# Patient Record
Sex: Female | Born: 1935 | Race: White | Hispanic: No | Smoking: Never smoker
Health system: Southern US, Community
[De-identification: ages and names within clinical notes are randomized; demographics above are authoritative.]

## PROBLEM LIST (undated history)

## (undated) DIAGNOSIS — Z5189 Encounter for other specified aftercare: Secondary | ICD-10-CM

## (undated) DIAGNOSIS — R112 Nausea with vomiting, unspecified: Secondary | ICD-10-CM

## (undated) DIAGNOSIS — IMO0001 Reserved for inherently not codable concepts without codable children: Secondary | ICD-10-CM

## (undated) DIAGNOSIS — D649 Anemia, unspecified: Secondary | ICD-10-CM

## (undated) DIAGNOSIS — I509 Heart failure, unspecified: Secondary | ICD-10-CM

## (undated) DIAGNOSIS — N189 Chronic kidney disease, unspecified: Secondary | ICD-10-CM

## (undated) DIAGNOSIS — M1990 Unspecified osteoarthritis, unspecified site: Secondary | ICD-10-CM

## (undated) DIAGNOSIS — R0602 Shortness of breath: Secondary | ICD-10-CM

## (undated) DIAGNOSIS — I10 Essential (primary) hypertension: Secondary | ICD-10-CM

## (undated) DIAGNOSIS — Z9889 Other specified postprocedural states: Secondary | ICD-10-CM

## (undated) DIAGNOSIS — G4730 Sleep apnea, unspecified: Secondary | ICD-10-CM

## (undated) DIAGNOSIS — E119 Type 2 diabetes mellitus without complications: Secondary | ICD-10-CM

## (undated) DIAGNOSIS — M4300 Spondylolysis, site unspecified: Secondary | ICD-10-CM

## (undated) HISTORY — PX: LAMINECTOMY: SHX219

## (undated) HISTORY — DX: Spondylolysis, site unspecified: M43.00

## (undated) HISTORY — DX: Heart failure, unspecified: I50.9

## (undated) HISTORY — DX: Type 2 diabetes mellitus without complications: E11.9

## (undated) HISTORY — PX: OTHER SURGICAL HISTORY: SHX169

## (undated) HISTORY — PX: ABDOMINAL HYSTERECTOMY: SHX81

## (undated) HISTORY — PX: CERVICAL DISCECTOMY: SHX98

## (undated) HISTORY — PX: BACK SURGERY: SHX140

## (undated) HISTORY — DX: Sleep apnea, unspecified: G47.30

---
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
## 1996-06-07 DIAGNOSIS — M4300 Spondylolysis, site unspecified: Secondary | ICD-10-CM

## 1996-06-07 HISTORY — DX: Spondylolysis, site unspecified: M43.00

## 2000-08-31 ENCOUNTER — Other Ambulatory Visit: Admission: RE | Admit: 2000-08-31 | Discharge: 2000-08-31 | Payer: Self-pay | Admitting: Gastroenterology

## 2000-08-31 ENCOUNTER — Encounter (INDEPENDENT_AMBULATORY_CARE_PROVIDER_SITE_OTHER): Payer: Self-pay

## 2000-09-26 ENCOUNTER — Ambulatory Visit (HOSPITAL_COMMUNITY): Admission: RE | Admit: 2000-09-26 | Discharge: 2000-09-26 | Payer: Self-pay | Admitting: Gastroenterology

## 2000-09-26 ENCOUNTER — Encounter: Payer: Self-pay | Admitting: Gastroenterology

## 2003-10-21 ENCOUNTER — Ambulatory Visit (HOSPITAL_COMMUNITY): Admission: RE | Admit: 2003-10-21 | Discharge: 2003-10-21 | Payer: Self-pay | Admitting: Gastroenterology

## 2004-11-12 ENCOUNTER — Ambulatory Visit: Payer: Self-pay | Admitting: Family Medicine

## 2004-12-03 ENCOUNTER — Ambulatory Visit: Payer: Self-pay | Admitting: Gastroenterology

## 2005-04-23 ENCOUNTER — Encounter: Payer: Self-pay | Admitting: Orthopedic Surgery

## 2005-05-07 ENCOUNTER — Encounter: Payer: Self-pay | Admitting: Orthopedic Surgery

## 2005-06-07 ENCOUNTER — Encounter: Payer: Self-pay | Admitting: Orthopedic Surgery

## 2005-07-08 ENCOUNTER — Encounter: Payer: Self-pay | Admitting: Orthopedic Surgery

## 2005-08-05 ENCOUNTER — Encounter: Payer: Self-pay | Admitting: Orthopedic Surgery

## 2005-09-05 ENCOUNTER — Encounter: Payer: Self-pay | Admitting: Orthopedic Surgery

## 2005-12-27 ENCOUNTER — Ambulatory Visit: Payer: Self-pay | Admitting: Gastroenterology

## 2005-12-28 ENCOUNTER — Ambulatory Visit: Payer: Self-pay | Admitting: Gastroenterology

## 2005-12-28 ENCOUNTER — Encounter: Payer: Self-pay | Admitting: Gastroenterology

## 2006-02-08 ENCOUNTER — Ambulatory Visit: Payer: Self-pay | Admitting: Family Medicine

## 2006-03-02 ENCOUNTER — Ambulatory Visit: Payer: Self-pay | Admitting: Family Medicine

## 2006-03-07 ENCOUNTER — Ambulatory Visit: Payer: Self-pay | Admitting: Family Medicine

## 2006-04-07 ENCOUNTER — Ambulatory Visit: Payer: Self-pay | Admitting: Family Medicine

## 2006-05-11 ENCOUNTER — Ambulatory Visit: Payer: Self-pay | Admitting: Family Medicine

## 2006-05-19 ENCOUNTER — Encounter: Payer: Self-pay | Admitting: Family Medicine

## 2006-06-07 ENCOUNTER — Encounter: Payer: Self-pay | Admitting: Family Medicine

## 2006-06-07 ENCOUNTER — Ambulatory Visit: Payer: Self-pay | Admitting: Family Medicine

## 2006-07-08 ENCOUNTER — Encounter: Payer: Self-pay | Admitting: Family Medicine

## 2007-02-13 ENCOUNTER — Ambulatory Visit: Payer: Self-pay | Admitting: Family Medicine

## 2007-04-05 ENCOUNTER — Emergency Department: Payer: Self-pay

## 2007-10-02 ENCOUNTER — Emergency Department: Payer: Self-pay | Admitting: Emergency Medicine

## 2007-10-02 ENCOUNTER — Other Ambulatory Visit: Payer: Self-pay

## 2007-11-01 ENCOUNTER — Ambulatory Visit: Payer: Self-pay | Admitting: Gastroenterology

## 2007-12-04 ENCOUNTER — Ambulatory Visit: Payer: Self-pay | Admitting: Oncology

## 2007-12-06 ENCOUNTER — Ambulatory Visit: Payer: Self-pay | Admitting: Oncology

## 2007-12-12 ENCOUNTER — Ambulatory Visit: Payer: Self-pay | Admitting: Oncology

## 2008-01-02 ENCOUNTER — Encounter: Payer: Self-pay | Admitting: Internal Medicine

## 2008-01-06 ENCOUNTER — Ambulatory Visit: Payer: Self-pay | Admitting: Oncology

## 2008-02-20 ENCOUNTER — Ambulatory Visit: Payer: Self-pay | Admitting: Family Medicine

## 2008-02-23 ENCOUNTER — Ambulatory Visit: Payer: Self-pay | Admitting: Family Medicine

## 2008-02-26 ENCOUNTER — Ambulatory Visit: Payer: Self-pay | Admitting: Internal Medicine

## 2008-02-26 DIAGNOSIS — I10 Essential (primary) hypertension: Secondary | ICD-10-CM | POA: Insufficient documentation

## 2008-02-26 DIAGNOSIS — R5383 Other fatigue: Secondary | ICD-10-CM | POA: Insufficient documentation

## 2008-02-26 DIAGNOSIS — R069 Unspecified abnormalities of breathing: Secondary | ICD-10-CM | POA: Insufficient documentation

## 2008-02-26 DIAGNOSIS — G629 Polyneuropathy, unspecified: Secondary | ICD-10-CM | POA: Insufficient documentation

## 2008-02-26 DIAGNOSIS — R0609 Other forms of dyspnea: Secondary | ICD-10-CM

## 2008-02-26 DIAGNOSIS — R0989 Other specified symptoms and signs involving the circulatory and respiratory systems: Secondary | ICD-10-CM

## 2008-09-12 ENCOUNTER — Encounter: Payer: Self-pay | Admitting: Family Medicine

## 2008-10-05 ENCOUNTER — Encounter: Payer: Self-pay | Admitting: Family Medicine

## 2008-11-05 ENCOUNTER — Encounter: Payer: Self-pay | Admitting: Family Medicine

## 2008-12-05 ENCOUNTER — Encounter: Payer: Self-pay | Admitting: Family Medicine

## 2009-05-07 ENCOUNTER — Ambulatory Visit: Payer: Self-pay | Admitting: Oncology

## 2009-05-26 ENCOUNTER — Ambulatory Visit: Payer: Self-pay | Admitting: Oncology

## 2009-06-07 ENCOUNTER — Ambulatory Visit: Payer: Self-pay | Admitting: Oncology

## 2009-07-08 ENCOUNTER — Ambulatory Visit: Payer: Self-pay | Admitting: Family Medicine

## 2009-07-08 ENCOUNTER — Ambulatory Visit: Payer: Self-pay | Admitting: Oncology

## 2009-07-24 ENCOUNTER — Ambulatory Visit: Payer: Self-pay | Admitting: Oncology

## 2009-08-05 ENCOUNTER — Ambulatory Visit: Payer: Self-pay | Admitting: Oncology

## 2009-08-27 ENCOUNTER — Ambulatory Visit: Payer: Self-pay | Admitting: Gastroenterology

## 2009-10-05 ENCOUNTER — Ambulatory Visit: Payer: Self-pay | Admitting: Oncology

## 2009-10-23 ENCOUNTER — Ambulatory Visit: Payer: Self-pay | Admitting: Oncology

## 2009-11-05 ENCOUNTER — Ambulatory Visit: Payer: Self-pay | Admitting: Oncology

## 2009-11-05 ENCOUNTER — Ambulatory Visit: Payer: Self-pay | Admitting: Family Medicine

## 2009-11-06 ENCOUNTER — Ambulatory Visit: Payer: Self-pay | Admitting: Oncology

## 2009-12-22 ENCOUNTER — Ambulatory Visit: Payer: Self-pay | Admitting: Family Medicine

## 2009-12-24 ENCOUNTER — Ambulatory Visit: Payer: Self-pay | Admitting: Family Medicine

## 2010-01-05 ENCOUNTER — Ambulatory Visit: Payer: Self-pay | Admitting: Oncology

## 2010-01-07 ENCOUNTER — Ambulatory Visit: Payer: Self-pay | Admitting: Oncology

## 2010-02-05 ENCOUNTER — Ambulatory Visit: Payer: Self-pay | Admitting: Oncology

## 2010-02-12 ENCOUNTER — Ambulatory Visit: Payer: Self-pay | Admitting: Oncology

## 2010-03-02 ENCOUNTER — Ambulatory Visit: Payer: Self-pay | Admitting: Family Medicine

## 2010-03-07 ENCOUNTER — Ambulatory Visit: Payer: Self-pay | Admitting: Oncology

## 2010-03-30 ENCOUNTER — Inpatient Hospital Stay (HOSPITAL_COMMUNITY): Admission: RE | Admit: 2010-03-30 | Discharge: 2010-04-01 | Payer: Self-pay | Admitting: Neurosurgery

## 2010-08-19 LAB — CBC
MCH: 37.2 pg — ABNORMAL HIGH (ref 26.0–34.0)
MCV: 106.9 fL — ABNORMAL HIGH (ref 78.0–100.0)
Platelets: 273 10*3/uL (ref 150–400)
RBC: 2.9 MIL/uL — ABNORMAL LOW (ref 3.87–5.11)

## 2010-08-19 LAB — BASIC METABOLIC PANEL
BUN: 16 mg/dL (ref 6–23)
CO2: 31 mEq/L (ref 19–32)
Calcium: 9.6 mg/dL (ref 8.4–10.5)
Chloride: 103 mEq/L (ref 96–112)
Creatinine, Ser: 1.06 mg/dL (ref 0.4–1.2)
GFR calc Af Amer: >60 mL/min (ref >60)
GFR calc non Af Amer: 50 mL/min — ABNORMAL LOW (ref >60)
Glucose, Bld: 92 mg/dL (ref 70–99)
Potassium: 4.5 mEq/L (ref 3.5–5.1)
Sodium: 140 mEq/L (ref 135–145)

## 2010-09-23 ENCOUNTER — Ambulatory Visit: Payer: Self-pay | Admitting: Family Medicine

## 2010-10-12 ENCOUNTER — Encounter: Payer: Self-pay | Admitting: Family Medicine

## 2010-10-23 NOTE — Assessment & Plan Note (Signed)
[facility]                           GASTROENTEROLOGY OFFICE NOTE   NAME:Tarleton, Amyri                     MRN:          [id number]  DATE:12/27/2005                            DOB:          [birth year]    PROBLEM:  Dysphagia.   HISTORY OF PRESENT ILLNESS:  Mrs. Kitchings has returned still complaining  of dysphagia.  She has dysphagia to solids and has what sounds like  occasional food impaction requiring liquids to pass her solid foods.  She  also complains of intermittent loose stools with slight soilage of her  undergarments.  She is without abdominal pain.  Reflux symptoms are well-  controlled.   MEDICATIONS:  Medication include Neurontin, Premarin, atenolol, and AcipHex.   PHYSICAL EXAMINATION:  VITAL SIGNS:  Pulse 78, blood pressure 132/72, weight  177.   IMPRESSION:  Recurrent esophageal stricture.   RECOMMENDATION:  1.  Upper endoscopy with savary dilatation.  2.  Fiber supplementation for loose stools.                                   Barbette Hair. Arlyce Dice, MD, [facility]   RDK/MedQ  DD:  12/27/2005  DT:  12/27/2005  Job #:  [id number]   cc:   Skip Estimable

## 2010-11-06 ENCOUNTER — Encounter: Payer: Self-pay | Admitting: Family Medicine

## 2010-12-06 ENCOUNTER — Encounter: Payer: Self-pay | Admitting: Family Medicine

## 2010-12-29 LAB — HM COLONOSCOPY: HM Colonoscopy: NORMAL

## 2010-12-29 LAB — HM DEXA SCAN

## 2011-01-06 ENCOUNTER — Encounter: Payer: Self-pay | Admitting: Family Medicine

## 2011-01-13 ENCOUNTER — Ambulatory Visit: Payer: Self-pay | Admitting: Neurosurgery

## 2011-01-15 ENCOUNTER — Ambulatory Visit: Payer: Self-pay | Admitting: Ophthalmology

## 2011-01-26 ENCOUNTER — Ambulatory Visit: Payer: Self-pay | Admitting: Ophthalmology

## 2011-02-06 ENCOUNTER — Encounter: Payer: Self-pay | Admitting: Family Medicine

## 2011-04-07 ENCOUNTER — Encounter (HOSPITAL_COMMUNITY): Payer: Self-pay

## 2011-04-07 ENCOUNTER — Encounter (HOSPITAL_COMMUNITY)
Admission: RE | Admit: 2011-04-07 | Discharge: 2011-04-07 | Disposition: A | Discharge status: Home / Self Care | Payer: Medicare Other | Source: Ambulatory Visit | Attending: Neurosurgery | Admitting: Neurosurgery

## 2011-04-07 HISTORY — DX: Encounter for other specified aftercare: Z51.89

## 2011-04-07 HISTORY — DX: Reserved for inherently not codable concepts without codable children: IMO0001

## 2011-04-07 HISTORY — DX: Unspecified osteoarthritis, unspecified site: M19.90

## 2011-04-07 HISTORY — DX: Shortness of breath: R06.02

## 2011-04-07 HISTORY — DX: Anemia, unspecified: D64.9

## 2011-04-07 HISTORY — DX: Chronic kidney disease, unspecified: N18.9

## 2011-04-07 HISTORY — DX: Other specified postprocedural states: R11.2

## 2011-04-07 HISTORY — DX: Essential (primary) hypertension: I10

## 2011-04-07 HISTORY — DX: Other specified postprocedural states: Z98.890

## 2011-04-07 LAB — BASIC METABOLIC PANEL
BUN: 16 mg/dL (ref 6–23)
CO2: 31 mEq/L (ref 19–32)
Calcium: 9.3 mg/dL (ref 8.4–10.5)
Chloride: 103 mEq/L (ref 96–112)
Creatinine, Ser: 0.83 mg/dL (ref 0.50–1.10)
GFR calc Af Amer: 74 mL/min — ABNORMAL LOW (ref >90)

## 2011-04-07 LAB — CBC
HCT: 30.7 % — ABNORMAL LOW (ref 36.0–46.0)
MCH: 37.2 pg — ABNORMAL HIGH (ref 26.0–34.0)
MCV: 108.9 fL — ABNORMAL HIGH (ref 78.0–100.0)
Platelets: 278 10*3/uL (ref 150–400)
RDW: 17.6 % — ABNORMAL HIGH (ref 11.5–15.5)
WBC: 9.5 10*3/uL (ref 4.0–10.5)

## 2011-04-07 NOTE — Pre-Procedure Instructions (Signed)
20 Renee Carpenter  04/07/2011   Your procedure is scheduled on: Wednesday, November 7TH  Report to [facility] at 6:30 AM.  Call this number if you have problems the morning of surgery: [phone]   Remember:   Do not eat food: 12 MIDNIGHT TUESDAY  Do not drink clear liquids: 4 Hours before arrival.  Take these medicines the morning of surgery with A SIP OF WATER: TRAMADOL   Do not wear jewelry, make-up or nail polish.   Do not wear lotions, powders, or perfumes. You may wear deodorant.   Do not shave 48 hours prior to surgery.   Do not bring valuables to the hospital.   Contacts, dentures or bridgework may not be worn into surgery.   Leave suitcase in the car. After surgery it may be brought to your room .  For patients admitted to the hospital, checkout time is 11:00 AM the day of discharge.   Patients discharged the day of surgery will not be allowed to drive home.  Name and phone number of your driver: HARRELL BARRINGTON --FRIEND  Special Instructions: CHG Shower Use Special Wash: 1/2 bottle night before surgery and 1/2 bottle morning of surgery.   Please read over the following fact sheets that you were given: Coughing and Deep Breathing and MRSA Information

## 2011-04-13 MED ORDER — CEFAZOLIN SODIUM 1-5 GM-% IV SOLN
1.0000 g | INTRAVENOUS | Status: DC
  Filled 2011-04-13: qty 50

## 2011-04-14 ENCOUNTER — Ambulatory Visit (HOSPITAL_COMMUNITY): Payer: Medicare Other

## 2011-04-14 ENCOUNTER — Ambulatory Visit (HOSPITAL_COMMUNITY): Payer: Medicare Other | Admitting: Anesthesiology

## 2011-04-14 ENCOUNTER — Inpatient Hospital Stay (HOSPITAL_COMMUNITY)
Admission: RE | Admit: 2011-04-14 | Discharge: 2011-04-19 | DRG: 491 | Disposition: A | Discharge status: Skilled Nursing Facility | Payer: Medicare Other | Source: Ambulatory Visit | Attending: Neurosurgery | Admitting: Neurosurgery

## 2011-04-14 ENCOUNTER — Encounter (HOSPITAL_COMMUNITY): Payer: Self-pay | Admitting: Anesthesiology

## 2011-04-14 ENCOUNTER — Encounter (HOSPITAL_COMMUNITY): Payer: Self-pay | Admitting: *Deleted

## 2011-04-14 ENCOUNTER — Encounter (HOSPITAL_COMMUNITY)
Admission: RE | Disposition: A | Discharge status: Skilled Nursing Facility | Payer: Self-pay | Source: Ambulatory Visit | Attending: Neurosurgery

## 2011-04-14 DIAGNOSIS — M47817 Spondylosis without myelopathy or radiculopathy, lumbosacral region: Principal | ICD-10-CM | POA: Diagnosis present

## 2011-04-14 DIAGNOSIS — Z981 Arthrodesis status: Secondary | ICD-10-CM

## 2011-04-14 DIAGNOSIS — I10 Essential (primary) hypertension: Secondary | ICD-10-CM | POA: Diagnosis present

## 2011-04-14 DIAGNOSIS — G589 Mononeuropathy, unspecified: Secondary | ICD-10-CM | POA: Diagnosis present

## 2011-04-14 DIAGNOSIS — M48061 Spinal stenosis, lumbar region without neurogenic claudication: Secondary | ICD-10-CM | POA: Diagnosis present

## 2011-04-14 DIAGNOSIS — Z01812 Encounter for preprocedural laboratory examination: Secondary | ICD-10-CM

## 2011-04-14 HISTORY — PX: LUMBAR LAMINECTOMY/DECOMPRESSION MICRODISCECTOMY: SHX5026

## 2011-04-14 SURGERY — LUMBAR LAMINECTOMY/DECOMPRESSION MICRODISCECTOMY
Anesthesia: General | Site: Spine Lumbar | Wound class: Clean

## 2011-04-14 MED ORDER — SODIUM CHLORIDE 0.9 % IJ SOLN: 3.0000 mL | Freq: Two times a day (BID) | INTRAMUSCULAR | Status: DC

## 2011-04-14 MED ORDER — PROMETHAZINE HCL 25 MG PO TABS: 12.5000 mg | ORAL_TABLET | Freq: Four times a day (QID) | ORAL | Status: DC | PRN

## 2011-04-14 MED ORDER — PANTOPRAZOLE SODIUM 40 MG PO TBEC
40.0000 mg | DELAYED_RELEASE_TABLET | Freq: Every day | ORAL | Status: DC
  Administered 2011-04-14 – 2011-04-19 (×6): 40 mg via ORAL
  Filled 2011-04-14 (×6): qty 1

## 2011-04-14 MED ORDER — GABAPENTIN 300 MG PO CAPS
300.0000 mg | ORAL_CAPSULE | Freq: Every day | ORAL | Status: DC
  Administered 2011-04-15 – 2011-04-17 (×3): 300 mg via ORAL
  Filled 2011-04-14 (×4): qty 1

## 2011-04-14 MED ORDER — ONDANSETRON HCL 4 MG/2ML IJ SOLN
4.0000 mg | INTRAMUSCULAR | Status: DC | PRN
  Administered 2011-04-14: 4 mg via INTRAVENOUS
  Filled 2011-04-14: qty 2

## 2011-04-14 MED ORDER — SODIUM CHLORIDE 0.9 % IV SOLN
250.0000 mL | INTRAVENOUS | Status: DC
  Administered 2011-04-15: 250 mL via INTRAVENOUS

## 2011-04-14 MED ORDER — ARTIFICIAL TEARS OP OINT
TOPICAL_OINTMENT | OPHTHALMIC | Status: DC | PRN
  Administered 2011-04-14: 1 via OPHTHALMIC

## 2011-04-14 MED ORDER — GABAPENTIN 600 MG PO TABS
600.0000 mg | ORAL_TABLET | Freq: Every day | ORAL | Status: DC
  Administered 2011-04-14 – 2011-04-16 (×3): 600 mg via ORAL
  Filled 2011-04-14 (×4): qty 1

## 2011-04-14 MED ORDER — TRAMADOL HCL 50 MG PO TABS
50.0000 mg | ORAL_TABLET | Freq: Four times a day (QID) | ORAL | Status: DC | PRN
  Administered 2011-04-15 – 2011-04-19 (×9): 50 mg via ORAL
  Filled 2011-04-14 (×7): qty 1

## 2011-04-14 MED ORDER — GABAPENTIN 300 MG PO CAPS: 300.0000 mg | ORAL_CAPSULE | Freq: Four times a day (QID) | ORAL | Status: DC

## 2011-04-14 MED ORDER — DEXAMETHASONE SODIUM PHOSPHATE 4 MG/ML IJ SOLN
8.0000 mg | Freq: Once | INTRAMUSCULAR | Status: AC
  Administered 2011-04-14: 8 mg via INTRAVENOUS

## 2011-04-14 MED ORDER — NEOSTIGMINE METHYLSULFATE 1 MG/ML IJ SOLN
INTRAMUSCULAR | Status: DC | PRN
  Administered 2011-04-14: 2 mg via INTRAVENOUS

## 2011-04-14 MED ORDER — KCL-LACTATED RINGERS 20 MEQ/L IV SOLN
INTRAVENOUS | Status: DC
  Filled 2011-04-14: qty 1000

## 2011-04-14 MED ORDER — HEMOSTATIC AGENTS (NO CHARGE) OPTIME
TOPICAL | Status: DC | PRN
  Administered 2011-04-14: 1 via TOPICAL

## 2011-04-14 MED ORDER — MORPHINE SULFATE 2 MG/ML IJ SOLN
1.0000 mg | INTRAMUSCULAR | Status: DC | PRN
  Administered 2011-04-14: 1 mg via INTRAVENOUS
  Administered 2011-04-14: 2 mg via INTRAVENOUS
  Administered 2011-04-14: 1 mg via INTRAVENOUS
  Administered 2011-04-15 – 2011-04-19 (×12): 2 mg via INTRAVENOUS
  Filled 2011-04-14 (×16): qty 1

## 2011-04-14 MED ORDER — ACYCLOVIR 400 MG PO TABS
400.0000 mg | ORAL_TABLET | Freq: Every day | ORAL | Status: DC
  Administered 2011-04-14: 400 mg via ORAL
  Filled 2011-04-14 (×2): qty 1

## 2011-04-14 MED ORDER — HYDROMORPHONE HCL PF 1 MG/ML IJ SOLN
0.2500 mg | INTRAMUSCULAR | Status: DC | PRN
  Administered 2011-04-14 (×2): 0.25 mg via INTRAVENOUS

## 2011-04-14 MED ORDER — FENTANYL CITRATE 0.05 MG/ML IJ SOLN
INTRAMUSCULAR | Status: DC | PRN
  Administered 2011-04-14 (×2): 100 ug via INTRAVENOUS
  Administered 2011-04-14: 50 ug via INTRAVENOUS

## 2011-04-14 MED ORDER — ONDANSETRON HCL 4 MG/2ML IJ SOLN
INTRAMUSCULAR | Status: DC | PRN
  Administered 2011-04-14 (×2): 4 mg via INTRAVENOUS

## 2011-04-14 MED ORDER — CEFAZOLIN SODIUM 1-5 GM-% IV SOLN
1.0000 g | Freq: Three times a day (TID) | INTRAVENOUS | Status: AC
  Administered 2011-04-14 – 2011-04-15 (×2): 1 g via INTRAVENOUS
  Filled 2011-04-14 (×2): qty 50

## 2011-04-14 MED ORDER — ROCURONIUM BROMIDE 100 MG/10ML IV SOLN
INTRAVENOUS | Status: DC | PRN
  Administered 2011-04-14: 40 mg via INTRAVENOUS

## 2011-04-14 MED ORDER — CEFAZOLIN SODIUM 1-5 GM-% IV SOLN
INTRAVENOUS | Status: DC | PRN
  Administered 2011-04-14: 1 g via INTRAVENOUS

## 2011-04-14 MED ORDER — SODIUM CHLORIDE 0.9 % IJ SOLN: 3.0000 mL | INTRAMUSCULAR | Status: DC | PRN

## 2011-04-14 MED ORDER — MORPHINE SULFATE 2 MG/ML IJ SOLN: 0.0500 mg/kg | INTRAMUSCULAR | Status: DC | PRN

## 2011-04-14 MED ORDER — THERA M PLUS PO TABS
1.0000 | ORAL_TABLET | Freq: Every day | ORAL | Status: DC
  Administered 2011-04-14 – 2011-04-19 (×5): 1 via ORAL
  Filled 2011-04-14 (×6): qty 1

## 2011-04-14 MED ORDER — DEXAMETHASONE SODIUM PHOSPHATE 10 MG/ML IJ SOLN
INTRAMUSCULAR | Status: AC
  Filled 2011-04-14: qty 1

## 2011-04-14 MED ORDER — LOSARTAN POTASSIUM 50 MG PO TABS
50.0000 mg | ORAL_TABLET | Freq: Every day | ORAL | Status: DC
  Administered 2011-04-14 – 2011-04-18 (×5): 50 mg via ORAL
  Filled 2011-04-14 (×6): qty 1

## 2011-04-14 MED ORDER — BACITRACIN ZINC 500 UNIT/GM EX OINT
TOPICAL_OINTMENT | CUTANEOUS | Status: DC | PRN
  Administered 2011-04-14: 1 via TOPICAL

## 2011-04-14 MED ORDER — HYDROMORPHONE HCL PF 1 MG/ML IJ SOLN
0.2500 mg | INTRAMUSCULAR | Status: DC | PRN
  Administered 2011-04-14: 0.5 mg via INTRAVENOUS

## 2011-04-14 MED ORDER — GLYCOPYRROLATE 0.2 MG/ML IJ SOLN
INTRAMUSCULAR | Status: DC | PRN
  Administered 2011-04-14: 0.2 mg via INTRAVENOUS

## 2011-04-14 MED ORDER — DOCUSATE SODIUM 100 MG PO CAPS
100.0000 mg | ORAL_CAPSULE | Freq: Two times a day (BID) | ORAL | Status: DC
  Administered 2011-04-14 – 2011-04-19 (×10): 100 mg via ORAL
  Filled 2011-04-14 (×10): qty 1

## 2011-04-14 MED ORDER — LACTATED RINGERS IV SOLN
INTRAVENOUS | Status: DC | PRN
  Administered 2011-04-14 (×2): via INTRAVENOUS

## 2011-04-14 MED ORDER — MIDAZOLAM HCL 5 MG/5ML IJ SOLN
INTRAMUSCULAR | Status: DC | PRN
  Administered 2011-04-14: 0.5 mg via INTRAVENOUS

## 2011-04-14 MED ORDER — GABAPENTIN 300 MG PO CAPS
300.0000 mg | ORAL_CAPSULE | Freq: Every day | ORAL | Status: DC
  Administered 2011-04-15 – 2011-04-16 (×2): 300 mg via ORAL
  Filled 2011-04-14 (×3): qty 1

## 2011-04-14 MED ORDER — HYPROMELLOSE (GONIOSCOPIC) 2.5 % OP SOLN
1.0000 [drp] | Freq: Three times a day (TID) | OPHTHALMIC | Status: DC | PRN
  Filled 2011-04-14: qty 15

## 2011-04-14 MED ORDER — DIAZEPAM 2 MG PO TABS
2.0000 mg | ORAL_TABLET | Freq: Four times a day (QID) | ORAL | Status: DC | PRN
  Administered 2011-04-17 – 2011-04-19 (×7): 2 mg via ORAL
  Filled 2011-04-14 (×6): qty 1

## 2011-04-14 MED ORDER — BUPIVACAINE-EPINEPHRINE PF 0.5-1:200000 % IJ SOLN
INTRAMUSCULAR | Status: DC | PRN
  Administered 2011-04-14: 10 mL

## 2011-04-14 MED ORDER — HYDROXYZINE HCL 10 MG PO TABS
10.0000 mg | ORAL_TABLET | Freq: Every day | ORAL | Status: DC | PRN
  Filled 2011-04-14: qty 1

## 2011-04-14 MED ORDER — ESTROGENS CONJUGATED 0.625 MG PO TABS
0.6250 mg | ORAL_TABLET | Freq: Every day | ORAL | Status: DC
  Administered 2011-04-14 – 2011-04-19 (×6): 0.625 mg via ORAL
  Filled 2011-04-14 (×6): qty 1

## 2011-04-14 MED ORDER — PROPOFOL 10 MG/ML IV EMUL
INTRAVENOUS | Status: DC | PRN
  Administered 2011-04-14: 120 mg via INTRAVENOUS

## 2011-04-14 MED ORDER — LACTATED RINGERS IV SOLN: INTRAVENOUS | Status: DC

## 2011-04-14 MED ORDER — SODIUM CHLORIDE 0.9 % IR SOLN
Status: DC | PRN
  Administered 2011-04-14 (×3)

## 2011-04-14 MED ORDER — HYDROMORPHONE HCL PF 1 MG/ML IJ SOLN
0.2500 mg | INTRAMUSCULAR | Status: DC | PRN
Start: 2011-04-14 — End: 2011-04-14
  Administered 2011-04-14 (×2): 0.5 mg via INTRAVENOUS

## 2011-04-14 MED ORDER — ZOLPIDEM TARTRATE 5 MG PO TABS: 5.0000 mg | ORAL_TABLET | Freq: Every evening | ORAL | Status: DC | PRN

## 2011-04-14 SURGICAL SUPPLY — 58 items
APL SKNCLS STERI-STRIP NONHPOA (GAUZE/BANDAGES/DRESSINGS) ×1
BAG DECANTER FOR FLEXI CONT (MISCELLANEOUS) ×2 IMPLANT
BENZOIN TINCTURE PRP APPL 2/3 (GAUZE/BANDAGES/DRESSINGS) ×2 IMPLANT
BLADE SURG ROTATE 9660 (MISCELLANEOUS) IMPLANT
BRUSH SCRUB EZ PLAIN DRY (MISCELLANEOUS) ×2 IMPLANT
BUR ACORN 6.0 (BURR) ×2 IMPLANT
BUR MATCHSTICK NEURO 3.0 LAGG (BURR) ×2 IMPLANT
CANISTER SUCTION 2500CC (MISCELLANEOUS) ×2 IMPLANT
CLOTH BEACON ORANGE TIMEOUT ST (SAFETY) ×2 IMPLANT
CONT SPEC 4OZ CLIKSEAL STRL BL (MISCELLANEOUS) ×2 IMPLANT
DRAPE LAPAROTOMY 100X72X124 (DRAPES) ×2 IMPLANT
DRAPE MICROSCOPE LEICA (MISCELLANEOUS) ×2 IMPLANT
DRAPE POUCH INSTRU U-SHP 10X18 (DRAPES) ×2 IMPLANT
DRAPE SURG 17X23 STRL (DRAPES) ×8 IMPLANT
ELECT BLADE 4.0 EZ CLEAN MEGAD (MISCELLANEOUS) ×2
ELECT REM PT RETURN 9FT ADLT (ELECTROSURGICAL) ×2
ELECTRODE BLDE 4.0 EZ CLN MEGD (MISCELLANEOUS) ×1 IMPLANT
ELECTRODE REM PT RTRN 9FT ADLT (ELECTROSURGICAL) ×1 IMPLANT
GAUZE SPONGE 4X4 16PLY XRAY LF (GAUZE/BANDAGES/DRESSINGS) IMPLANT
GLOVE BIO SURGEON STRL SZ8.5 (GLOVE) ×2 IMPLANT
GLOVE BIOGEL PI IND STRL 8 (GLOVE) IMPLANT
GLOVE BIOGEL PI IND STRL 8.5 (GLOVE) IMPLANT
GLOVE BIOGEL PI INDICATOR 8 (GLOVE) ×1
GLOVE BIOGEL PI INDICATOR 8.5 (GLOVE) ×2
GLOVE ECLIPSE 7.5 STRL STRAW (GLOVE) ×1 IMPLANT
GLOVE EXAM NITRILE LRG STRL (GLOVE) IMPLANT
GLOVE EXAM NITRILE MD LF STRL (GLOVE) ×1 IMPLANT
GLOVE EXAM NITRILE XL STR (GLOVE) IMPLANT
GLOVE EXAM NITRILE XS STR PU (GLOVE) IMPLANT
GLOVE SS BIOGEL STRL SZ 8 (GLOVE) ×1 IMPLANT
GLOVE SUPERSENSE BIOGEL SZ 8 (GLOVE) ×3
GLOVE SURG SS PI 8.0 STRL IVOR (GLOVE) ×1 IMPLANT
GOWN BRE IMP SLV AUR LG STRL (GOWN DISPOSABLE) IMPLANT
GOWN BRE IMP SLV AUR XL STRL (GOWN DISPOSABLE) ×3 IMPLANT
GOWN STRL REIN 2XL LVL4 (GOWN DISPOSABLE) ×2 IMPLANT
KIT BASIN OR (CUSTOM PROCEDURE TRAY) ×2 IMPLANT
KIT ROOM TURNOVER OR (KITS) ×2 IMPLANT
NDL HYPO 21X1.5 SAFETY (NEEDLE) IMPLANT
NEEDLE HYPO 21X1.5 SAFETY (NEEDLE) IMPLANT
NEEDLE HYPO 22GX1.5 SAFETY (NEEDLE) ×2 IMPLANT
NS IRRIG 1000ML POUR BTL (IV SOLUTION) ×2 IMPLANT
PACK LAMINECTOMY NEURO (CUSTOM PROCEDURE TRAY) ×2 IMPLANT
PAD ARMBOARD 7.5X6 YLW CONV (MISCELLANEOUS) ×8 IMPLANT
PATTIES SURGICAL .5 X1 (DISPOSABLE) ×1 IMPLANT
RUBBERBAND STERILE (MISCELLANEOUS) ×4 IMPLANT
SPONGE GAUZE 4X4 12PLY (GAUZE/BANDAGES/DRESSINGS) ×2 IMPLANT
SPONGE SURGIFOAM ABS GEL SZ50 (HEMOSTASIS) ×2 IMPLANT
STRIP CLOSURE SKIN 1/2X4 (GAUZE/BANDAGES/DRESSINGS) ×2 IMPLANT
SUT PROLENE 6 0 BV (SUTURE) ×1 IMPLANT
SUT VIC AB 1 CT1 18XBRD ANBCTR (SUTURE) ×2 IMPLANT
SUT VIC AB 1 CT1 8-18 (SUTURE) ×4
SUT VIC AB 2-0 CP2 18 (SUTURE) ×4 IMPLANT
SYR 20CC LL (SYRINGE) IMPLANT
SYR 20ML ECCENTRIC (SYRINGE) ×2 IMPLANT
TAPE CLOTH SURG 4X10 WHT LF (GAUZE/BANDAGES/DRESSINGS) ×1 IMPLANT
TOWEL OR 17X24 6PK STRL BLUE (TOWEL DISPOSABLE) ×2 IMPLANT
TOWEL OR 17X26 10 PK STRL BLUE (TOWEL DISPOSABLE) ×2 IMPLANT
WATER STERILE IRR 1000ML POUR (IV SOLUTION) ×2 IMPLANT

## 2011-04-14 NOTE — Progress Notes (Signed)
Subjective:  The patient is alert appears comfortable and has no complaints. She looks well.  Objective: Vital signs in last 24 hours: Temp:  [97.6 F (36.4 C)-98.1 F (36.7 C)] 97.6 F (36.4 C) (11/07 1600) Pulse Rate:  [67-103] 72  (11/07 1600) Resp:  [14-27] 16  (11/07 1600) BP: (138-196)/(68-91) 156/75 mmHg (11/07 1600) SpO2:  [96 %-100 %] 96 % (11/07 1600)  Intake/Output from previous day:   Intake/Output this shift: Total I/O In: 1250 [I.V.:1250] Out: 75 [Blood:75]  Physical exam the patient is alert and oriented. She is moving her lower extremities well. Normal bulk motor strength in her bilateral quadriceps gastrocnemius dorsiflexors.  Lab Results: No results found for this basename: WBC:2,HGB:2,HCT:2,PLT:2 in the last 72 hours BMET No results found for this basename: NA:2,K:2,CL:2,CO2:2,GLUCOSE:2,BUN:2,CREATININE:2,CALCIUM:2 in the last 72 hours  Studies/Results: Dg Lumbar Spine 1 View Neuro Rm 31-dr. Lovell Sheehan  04/14/2011  *RADIOLOGY REPORT*  Clinical Data: Lumbar laminectomy and decompression at L3-4.  OPERATIVE LUMBAR SPINE - 1 VIEW 04/14/2011:  Comparison: Lumbar spine x-rays 12/25/2010 [facility].  Findings: The prior images were not numbered, and there are six non- rib bearing lumbar type vertebrae.  For the purposes of this dictation I will assume that T12 has small, rudimentary ribs at that the last lumbar segment is L5.  If the prior images were numbered differently, please adjust accordingly.  Single lateral image obtained at 0915 hours and submitted for interpretation post-operatively demonstrates the localizer device between the L3 and L4 spinous processes, directed toward the L3-4 disc space which demonstrates vacuum phenomenon.  IMPRESSION: L3-4 localized intraoperatively.  Please see above comments regarding numbering of the lumbar spine.  Original Report Authenticated By: Arnell Sieving, M.D.    Assessment/Plan: The patient is  doing well.  LOS: 0 days     Stetson Pelaez D 04/14/2011, 6:33 PM

## 2011-04-14 NOTE — Transfer of Care (Signed)
Immediate Anesthesia Transfer of Care Note  Patient: Renee Carpenter  Procedure(s) Performed:  LUMBAR LAMINECTOMY/DECOMPRESSION MICRODISCECTOMY - Lumbar Three and Lumbar Four Laminectomy  Patient Location: PACU  Anesthesia Type: General  Level of Consciousness: awake and patient cooperative  Airway & Oxygen Therapy: Patient Spontanous Breathing and Patient connected to face mask oxygen  Post-op Assessment: Report given to PACU RN, Post -op Vital signs reviewed and stable and Patient moving all extremities X 4  Post vital signs: Reviewed and stable  Complications: No apparent anesthesia complications

## 2011-04-14 NOTE — Progress Notes (Signed)
Subjective: Patient reports her back is sore.     Objective: Vital signs in last 24 hours: Temp:  [97.7 F (36.5 C)-98 F (36.7 C)] 97.7 F (36.5 C) (11/07 1105) Pulse Rate:  [74-78] 78  (11/07 1105) Resp:  [18] 18  (11/07 0714) BP: (138)/(68) 138/68 mmHg (11/07 0714) SpO2:  [98 %-100 %] 100 % (11/07 1105)  Intake/Output from previous day:   Intake/Output this shift: Total I/O In: 1000 [I.V.:1000] Out: 75 [Blood:75]  Physical exam patient is alert pleasant and moving are all her extremities well. Her dressing is dry  Lab Results: No results found for this basename: WBC:2,HGB:2,HCT:2,PLT:2 in the last 72 hours BMET No results found for this basename: NA:2,K:2,CL:2,CO2:2,GLUCOSE:2,BUN:2,CREATININE:2,CALCIUM:2 in the last 72 hours  Studies/Results: Dg Lumbar Spine 1 View Neuro Rm 31-dr. Lovell Sheehan  04/14/2011  *RADIOLOGY REPORT*  Clinical Data: Lumbar laminectomy and decompression at L3-4.  OPERATIVE LUMBAR SPINE - 1 VIEW 04/14/2011:  Comparison: Lumbar spine x-rays 12/25/2010 [facility].  Findings: The prior images were not numbered, and there are six non- rib bearing lumbar type vertebrae.  For the purposes of this dictation I will assume that T12 has small, rudimentary ribs at that the last lumbar segment is L5.  If the prior images were numbered differently, please adjust accordingly.  Single lateral image obtained at 0915 hours and submitted for interpretation post-operatively demonstrates the localizer device between the L3 and L4 spinous processes, directed toward the L3-4 disc space which demonstrates vacuum phenomenon.  IMPRESSION: L3-4 localized intraoperatively.  Please see above comments regarding numbering of the lumbar spine.  Original Report Authenticated By: Arnell Sieving, M.D.    Assessment/Plan: The patient is doing well postoperatively  LOS: 0 days     Hommer Cunliffe D 04/14/2011, 11:53 AM

## 2011-04-14 NOTE — Anesthesia Postprocedure Evaluation (Signed)
  Anesthesia Post-op Note  Patient: Renee Carpenter  Procedure(s) Performed:  LUMBAR LAMINECTOMY/DECOMPRESSION MICRODISCECTOMY - Lumbar Three and Lumbar Four Laminectomy   Patient Location: PACU Anesthesia Type: General  Level of Consciousness: awake  Airway and Oxygen Therapy: Patient Spontanous Breathing  Post-op Pain: mild  Post-op Assessment: Post-op Vital signs reviewed  Post-op Vital Signs: stable  Complications: No apparent anesthesia complications

## 2011-04-14 NOTE — Preoperative (Signed)
Beta Blockers   Reason not to administer Beta Blockers:Not Applicable 

## 2011-04-14 NOTE — Anesthesia Procedure Notes (Addendum)
Procedure Name: Intubation Date/Time: 04/14/2011 8:40 AM Performed by: Adria Dill Pre-anesthesia Checklist: Patient identified, Emergency Drugs available, Suction available and Patient being monitored Patient Re-evaluated:Patient Re-evaluated prior to inductionOxygen Delivery Method: Circle System Utilized Preoxygenation: Pre-oxygenation with 100% oxygen Intubation Type: IV induction Ventilation: Mask ventilation without difficulty Laryngoscope Size: Miller and 2 Grade View: Grade I Tube type: Oral Tube size: 7.0 mm Number of attempts: 1 Airway Equipment and Method: stylet Placement Confirmation: ETT inserted through vocal cords under direct vision,  positive ETCO2 and breath sounds checked- equal and bilateral Secured at: 22 cm Tube secured with: Tape Dental Injury: Teeth and Oropharynx as per pre-operative assessment

## 2011-04-14 NOTE — Anesthesia Preprocedure Evaluation (Addendum)
Anesthesia Evaluation  Patient identified by MRN, date of birth, ID band Patient awake    Reviewed: Allergy & Precautions, H&P , Patient's Chart, lab work & pertinent test results  History of Anesthesia Complications (+) PONV and Emergence Delirium  Airway Mallampati: II TM Distance: >3 FB Neck ROM: Full   Comment: Cervical fusion; good range of motion; stiff left to right movement of head Dental  (+) Teeth Intact and Dental Advisory Given   Pulmonary shortness of breath and with exertion,  clear to auscultation        Cardiovascular hypertension, Pt. on medications Regular Normal 6 months ago normal nuclear stress test   Neuro/Psych  Neuromuscular disease (peripheral neuropathy BLE)    GI/Hepatic GERD-  Medicated and Controlled,  Endo/Other  Diabetes mellitus- (Diet controlled; pt reports "pre diabetic"), Type 2  Renal/GU Renal InsufficiencyRenal disease     Musculoskeletal  (+) Arthritis -, Osteoarthritis,    Abdominal   Peds  Hematology   Anesthesia Other Findings   Reproductive/Obstetrics                         Anesthesia Physical Anesthesia Plan  ASA: III  Anesthesia Plan: General   Post-op Pain Management:    Induction: Intravenous  Airway Management Planned: Oral ETT  Additional Equipment:   Intra-op Plan:   Post-operative Plan: Extubation in OR  Informed Consent: I have reviewed the patients History and Physical, chart, labs and discussed the procedure including the risks, benefits and alternatives for the proposed anesthesia with the patient or authorized representative who has indicated his/her understanding and acceptance.   Dental advisory given  Plan Discussed with: CRNA  Anesthesia Plan Comments:        Anesthesia Quick Evaluation

## 2011-04-14 NOTE — H&P (Signed)
  Chief complaint: Back pain  History of present illness: The patient is an 77 year old white female who has had several years of low back pain. Is undergone prior surgery dorm by Dr. Polo Riley. He performed 8 L4 decompressive laminectomy on her. The surgery seem to help temporarily.  More recently the patient has been having more trouble with her back. She complains of low back pain numbness and tingling radiating to her bilateral legs right and left legs equally. She has been worked up with a lumbar MRI scan by her primary physician Dr. Sullivan Lone.  Past medical history: Neuropathy cataracts cervical ruptured disc lumbar spinal stenosis  Surgical history: Anterior cervical discectomy, lumbar laminectomy 2006, partial hysterectomy 1940, left cataract surgery.  No known drug allergies.  Family medical history noncontributory.  Social history: The patient is married. She has a handicapped daughter who lives in a group home. Her husband has Parkinson's disease and dementia. She denies tobacco ethanol and drug use.  Physical exam Gen. a pleasant healthy-appearing 77 year old white female.  Height 5 foot 6 inches. Weight 165 pounds. HEENT normocephalic atraumatic. Pupils equal round reactive light. Oropharynx benign  Neck supple there are no masses meningismus deformities she has limited range of motion. Spurling's testing is negative. Cervical incision is well-healed.  Lungs clear to auscultation  Heart regular rate and rhythm  Extremities arms deformities.  Back exam her lumbar incision is well healed no deformities  Neurologic exam: The patient is alert and oriented x3 cranial nerves II through XII were intact. Motor strength is 5 over 5 in all tested muscle groups. Cerebellar exams intact to rapid alternating movements of the upper extremities bilaterally. Sensory sensation is decreased in bilateral feet. Deep tendon reflexes are symmetric. There is no ankle clonus.  Imaging studies: The  patient has had a lumbar MRI performed at [facility] on 01/13/2011. She has moderate to severe spinal stenosis at L3-4 and L4-5 with multilevel degenerative changes.  Assessment and plan L3-4 and L4-5 spinal stenosis lumbago lumbar radiculopathy. I have discussed situation with the patient and reviewed her MR scan with her. We have discussed the various treatment options including doing nothing and continued continued medical management, and surgery. I described the surgical option of an L4 laminectomy with bilateral L3 laminotomies. I've shown her surgical models. I've explained the risks benefits and alternatives surgery. We have discussed the expectations of recovery. I have answered all the patient's questions. The patient wants to proceed with surgery.

## 2011-04-14 NOTE — Op Note (Signed)
Brief history: The patient is an 77 year old white female who suffered from back hip and leg pain consistent with neurogenic claudication. She has failed medical management. She was worked up with a lumbar MRI which demonstrated she had spinal stenosis most prominent at L3-4 and L4-5. I discussed the various treatment options with the patient including surgery. The patient has weighed the risks benefits and alternative surgery the subcutaneous with a decompressive laminectomy.   Preoperative diagnosis: L3-4 and L4-5 spinal stenosis degenerative disease lumbar radiculopathy lumbago  Postoperative diagnosis: The same  Procedure: L4 laminectomy with bilateral L3 laminotomies to decompress the bilateral L3-L4 and L5 nerve roots using microdissection.  Surgeon: Delma Officer M.D.  Asst.: Shirlean Kelly M.D.  Anesthesia: Gen. endotracheal  Estimated blood loss: 100 cc.  Drains none  Complications durotomy  Description of procedure: The patient was brought to the operating room by the anesthesia team. General endotracheal anesthesia was induced. The patient was turned to the prone position on the Wilson frame. The patient's lumbosacral region was then prepared with Betadine scrub and Betadine solution. Sterile drapes were applied.  I then injected the area to be incised with Marcaine with epinephrine solution. I then used a scalpel to make a linear midline incision over the L3-4 and 45 interspaces. I then used the electrocautery to perform a bilateral subperiosteal dissection exposing the spinous process lamina of L3-L4 and L5. We then obtained intraoperative radiograph to confirm our location.  I then inserted the [facility] retractor for exposure. I began the decompression by incising the interspinous ligament at L3-4 and 45 with a scalpel. I then used Leksell nodule were to remove the spinous processes of L4 and the caudal aspect of the L3 spinous process. I then used a high-speed drill to  perform a bilateral L3 and L4 laminotomy. I then used a Kerrison punches to remove the L3-4 and 45 ligamentum flavum. Using first a posterior complete the L4 laminectomy. I removed the cephalad aspect of the L5 lamina with a Kerrison punch. I used Accurso punch to perform foraminotomies about the bilateral L3 L4 and L5 nerve roots. In doing this we did encounter quite a bit of scar tissue from her prior operation at L3-4. During decompression a small durotomy was created. This was closed with a single 6-0 Prolene suture. Obtaining a good closure. This was confirmed by anesthesia Valsalva and the patient. I then inspected the bilateral L3-4 and 45 intravertebral disc. It was bulging a bit but there was no significant disc herniations. I then palpated along the ventral surface of the thecal sac and the bilateral L3-L4 and L5 nerve roots. A noted there were well decompressed. We then obtained hemostasis using bipolar electrocautery. I then placed a DuraSeal over the durotomy site. We then removed the retractors and reapproximated patient's thoracic lumbar fascia with interrupted #1 Vicryl suture. I then reapproximated patient's subcutaneous tissue with interrupted 2-0 Vicryl suture. We reapproximated the skin with Steri-Strips and benzoin. The wound was then coated with bacitracin ointment. A sterile dressing was applied. The drapes were removed. The patient was subsequently returned to the supine position and extubated by the anesthesia team. She was then transported to the post anesthesia care unit in stable condition. All sponge instrument and needle counts were correct at the end of this case.

## 2011-04-15 DIAGNOSIS — M47817 Spondylosis without myelopathy or radiculopathy, lumbosacral region: Secondary | ICD-10-CM

## 2011-04-15 DIAGNOSIS — IMO0002 Reserved for concepts with insufficient information to code with codable children: Secondary | ICD-10-CM

## 2011-04-15 MED ORDER — ACYCLOVIR 200 MG PO CAPS
400.0000 mg | ORAL_CAPSULE | Freq: Every day | ORAL | Status: DC
  Administered 2011-04-15 – 2011-04-19 (×5): 400 mg via ORAL
  Filled 2011-04-15 (×5): qty 2

## 2011-04-15 NOTE — Progress Notes (Signed)
Occupational Therapy Evaluation Patient Details Name: Renee Carpenter MRN: [id number] DOB: 01-29-1926 Today's Date: 04/15/2011 1015-1046 EV2 Problem List:  Patient Active Problem List  Diagnoses  . NEUROPATHY  . HYPERTENSION  . FATIQUE AND MALAISE  . DYSPNEA  . Spinal stenosis of lumbar region at multiple levels    Past Medical History:  Past Medical History  Diagnosis Date  . PONV (postoperative nausea and vomiting)   . Hypertension   . Shortness of breath   . Anemia     WAS ANEMIC  . Blood transfusion     HAD 1 SEVERAL YRS AGO...3-4 YR  . Chronic kidney disease     SOME DECREASE IN KIDNEY FUNCTION  . Arthritis   . Normal cardiac stress test     REQUESTING COPY FROM [facility]  . Normal echocardiogram     REQUESTING THAT RESULT -- DR Park Breed    Past Surgical History:  Past Surgical History  Procedure Date  . Abdominal hysterectomy   . Back surgery     2 CERVICAL SURGERIES ALSO  . Cataracts     RIGHT EYE CATARACT    OT Assessment/Plan/Recommendation OT Assessment Clinical Impression Statement: Pt requires supervision for mobility.  OT will address shower transfer next session. OT Recommendation/Assessment: Patient will need skilled OT in the acute care venue OT Problem List: Decreased knowledge of precautions;Impaired balance (sitting and/or standing) OT Therapy Diagnosis : Generalized weakness;Acute pain OT Plan OT Frequency: Min 2X/week OT Treatment/Interventions: Self-care/ADL training;DME and/or AE instruction;Patient/family education OT Recommendation Follow Up Recommendations: None Equipment Recommended: None recommended by OT Individuals Consulted Consulted and Agree with Results and Recommendations: Patient OT Goals Acute Rehab OT Goals OT Goal Formulation: With patient Time For Goal Achievement: 7 days ADL Goals Pt Will Perform Tub/Shower Transfer: Shower transfer;with modified independence;Shower seat with back;Maintaining back safety  precautions ADL Goal: Tub/Shower Transfer - Progress: Other (comment)  OT Evaluation Precautions/Restrictions  Precautions Precautions: Back;Fall Precaution Booklet Issued: Yes (comment) (reviewed back precautions handout) Required Braces or Orthoses: No Prior Functioning Home Living Lives With: Spouse (spouse requires total care, has caregivers) Receives Help From: Personal care attendant (person care aide helps with housekeeping ) Type of Home: House Home Layout: Able to live on main level with bedroom/bathroom;Two level Home Access: Ramped entrance Bathroom Shower/Tub: Health visitor: Standard Home Adaptive Equipment: Walker - standard;Bedside commode/3-in-1;Shower chair with back Prior Function Level of Independence: Independent with gait;Needs assistance with homemaking;Independent with basic ADLs;Requires assistive device for independence Light Housekeeping: Minimal Driving: Yes Vocation: Retired ADL ADL Eating/Feeding: Performed;Independent Where Assessed - Eating/Feeding: Edge of bed Grooming: Performed;Supervision/safety;Wash/dry hands Where Assessed - Grooming: Standing at sink Upper Body Bathing: Simulated;Set up Where Assessed - Upper Body Bathing: Unsupported;Sitting, bed Lower Body Bathing: Simulated;Set up Where Assessed - Lower Body Bathing: Unsupported;Sitting, bed;Sit to stand from bed Upper Body Dressing: Performed;Set up Where Assessed - Upper Body Dressing: Sitting, bed;Unsupported Lower Body Dressing: Performed;Set up Where Assessed - Lower Body Dressing: Sitting, bed;Unsupported;Sit to stand from bed Toilet Transfer: Performed;Supervision/safety Toilet Transfer Method: Proofreader: Regular height toilet;Grab bars Toileting - Clothing Manipulation: Performed;Supervision/safety Where Assessed - Glass blower/designer Manipulation: Standing Toileting - Hygiene: Performed;Independent Where Assessed - Toileting  Hygiene: Sit on 3-in-1 or toilet Equipment Used: Reacher Vision/Perception  Vision - History Baseline Vision: Wears glasses only for reading Patient Visual Report: No change from baseline Cognition Cognition Arousal/Alertness: Awake/alert Overall Cognitive Status: Appears within functional limits for tasks assessed Orientation Level: Oriented X4 Sensation/Coordination Sensation Light Touch: Appears  Intact (UE) Light Touch Impaired Details: Impaired RLE;Impaired LLE Coordination Gross Motor Movements are Fluid and Coordinated: Yes Fine Motor Movements are Fluid and Coordinated: Yes Extremity Assessment RUE Assessment RUE Assessment:  (grossly functional--not formally assessed) LUE Assessment LUE Assessment:  (grossly functional--not formally assessed) Mobility  Bed Mobility Bed Mobility: Yes Rolling Right: 4: Min assist Rolling Right Details (indicate cue type and reason): Education for log roll technique Right Sidelying to Sit: 4: Min assist Right Sidelying to Sit Details (indicate cue type and reason): Education for sequencing lower extremities and trunk. Difficulty inititating trunk Sitting - Scoot to Edge of Bed: 6: Modified independent (Device/Increase time) Sit to Supine - Right: 4: Min assist Sit to Supine - Right Details (indicate cue type and reason): Assistance to ensure patient lowers to side first prior to rolling to back and ensure no twisting Transfers Transfers: Yes Sit to Stand: 5: Supervision;With upper extremity assist;From bed Sit to Stand Details (indicate cue type and reason): Education for hand placement when using rolling walker Stand to Sit: 5: Supervision;To toilet;To bed Stand to Sit Details: Verbal cues for hand placement    End of Session OT - End of Session Equipment Utilized During Treatment: Gait belt Activity Tolerance: Patient tolerated treatment well Patient left: in bed;with family/visitor present;with call bell in  reach General Behavior During Session: [name] for tasks performed Cognition: [facility] for tasks performed   Evern Bio 04/15/2011, 11:56 AM

## 2011-04-15 NOTE — Progress Notes (Signed)
Physical Therapy Evaluation Patient Details Name: Renee Carpenter MRN: [id number] DOB: 1925-08-11 Today's Date: 04/15/2011  Problem List:  Patient Active Problem List  Diagnoses  . NEUROPATHY  . HYPERTENSION  . FATIQUE AND MALAISE  . DYSPNEA  . Spinal stenosis of lumbar region at multiple levels    Past Medical History:  Past Medical History  Diagnosis Date  . PONV (postoperative nausea and vomiting)   . Hypertension   . Shortness of breath   . Anemia     WAS ANEMIC  . Blood transfusion     HAD 1 SEVERAL YRS AGO...3-4 YR  . Chronic kidney disease     SOME DECREASE IN KIDNEY FUNCTION  . Arthritis   . Normal cardiac stress test     REQUESTING COPY FROM [facility]  . Normal echocardiogram     REQUESTING THAT RESULT -- DR Park Breed    Past Surgical History:  Past Surgical History  Procedure Date  . Abdominal hysterectomy   . Back surgery     2 CERVICAL SURGERIES ALSO  . Cataracts     RIGHT EYE CATARACT    PT Assessment/Plan/Recommendation PT Assessment Clinical Impression Statement: Patient presents with significant right lower extremity pain status post back surgery. Patient will benefit from physical therapy to ensure patient mobilizes in correct manner to ensure optimal healing and also to maximize independence. PT Recommendation/Assessment: Patient will need skilled PT in the acute care venue PT Problem List: Decreased activity tolerance;Decreased knowledge of use of DME;Decreased knowledge of precautions PT Therapy Diagnosis : Difficulty walking;Acute pain PT Plan PT Frequency: Min 5X/week PT Treatment/Interventions: DME instruction;Gait training;Functional mobility training;Patient/family education PT Recommendation Follow Up Recommendations: None Equipment Recommended: None recommended by PT PT Goals  Acute Rehab PT Goals PT Goal Formulation: With patient Time For Goal Achievement: 7 days Pt will Roll Supine to Left/ Right Side: with modified  independence;Other (comment) (Without rail and HOB flat) Pt will go Side to Sit: with modified independence;with HOB 0 degrees Pt will go Sit to Side: with HOB 0 degrees;with modified independence Pt will Transfer Sit to Stand/Stand to Sit: with modified independence Pt will Ambulate: >150 feet;with modified independence;with rolling walker Additional Goals Additional Goal #1: Patient will recall all back precautions and demonstrate adherence to in functional activity  PT Evaluation Precautions/Restrictions  Precautions Precautions: Back Precaution Booklet Issued: Yes (comment) Required Braces or Orthoses: No Prior Functioning  Home Living Lives With: Spouse Receives Help From: Personal care attendant -24 hour care for spouse - assists in homemaking, meals etc Type of Home: House Home Layout: Two level;Able to live on main level with bedroom/bathroom Home Access: Ramped entrance Bathroom Shower/Tub: Walk-in shower Home Adaptive Equipment: Bedside commode/3-in-1;Shower chair with back;Walker - rolling Prior Function Level of Independence: Independent with gait;Needs assistance with homemaking Light Housekeeping: Minimal Driving: Yes Vocation: Retired Producer, television/film/video: Awake/alert Overall Cognitive Status: Appears within functional limits for tasks assessed Orientation Level: Oriented X4 Sensation/Coordination Sensation Light Touch: Impaired by gross assessment;Impaired Detail (Bilateral lower extremity neuropathy) Light Touch Impaired Details: Impaired RLE;Impaired LLE Extremity Assessment   Mobility (including Balance) Bed Mobility Bed Mobility: Yes Rolling Right: 4: Min assist Rolling Right Details (indicate cue type and reason): Education for log roll technique Right Sidelying to Sit: 4: Min assist Right Sidelying to Sit Details (indicate cue type and reason): Education for sequencing lower extremities and trunk. Difficulty inititating trunk Sitting  - Scoot to Edge of Bed: 6: Modified independent (Device/Increase time) Sit to Supine - Right: 4:  Min assist Sit to Supine - Right Details (indicate cue type and reason): Assistance to ensure patient lowers to side first prior to rolling to back and ensure no twisting Transfers Transfers: Yes Sit to Stand: 5: Supervision;With upper extremity assist;From bed Sit to Stand Details (indicate cue type and reason): Education for hand placement when using rolling walker Stand to Sit: 5: Supervision;With upper extremity assist;To bed Stand to Sit Details: Verbal cues for hand placement Ambulation/Gait Ambulation/Gait: Yes Ambulation/Gait Assistance: 5: Supervision Ambulation/Gait Assistance Details (indicate cue type and reason): Verbal cues for posture and for safe distance from rolling walker - cues for step sequence Ambulation Distance (Feet): 150 Feet Assistive device: Rolling walker Gait Pattern: Step-to pattern Gait velocity: Mild decreased spped secondary to left lower extremity pain  End of Session PT - End of Session Equipment Utilized During Treatment: Gait belt Activity Tolerance: Patient limited by pain Patient left: in bed;with call bell in reach Nurse Communication:  (Pain) General Behavior During Session: [facility] for tasks performed Cognition: [facility] for tasks performed  [facility] [place] 04/15/2011, 8:20 AM

## 2011-04-15 NOTE — Consult Note (Signed)
Physical Medicine and Rehabilitation Consult Reason for Consult:lumbar radiculopathy Referring Phsyician: Dr. Vanessa Kick is an 77 y.o. female.   HPI: This 77 year old white female with a history of lumbar back surgery many years ago as well as cervical disc surgery x2. She was admitted with increasing low back pain. She complained of low back pain with numbness to the lower extremeties .  X-ray and imaging revealed lumbar 3 and 4 as well as lumbar 4 and 5 spinal stenosis with radiculopathy she underwent lumbar L4 as well as laminectomy with bilateral lumbar L3 laminotomies to decompress bilateral lumbar 3 and 4 as well as lumbar 5  nerve roots this procedure was completed on 11 7 by Dr. Tressie Stalker and she progressed nicely. No back brace at this time was indicated. Therapy was initiated and she was ambulating 150 feet with a rolling walker. She required minimal assistance to come from the sit to supine position. She was seen by inpatient rehabilitation services and felt by physician physical and occupational therapy to be a good candidate for inpatient rehabilitation services.  @ROS @ Review of Systems  Constitutional: Negative.   HENT: Negative.   Eyes: Negative.   Respiratory: Negative.   Cardiovascular: Negative.   Gastrointestinal: Negative.   Genitourinary: Negative for dysuria and urgency.  Musculoskeletal: Positive for myalgias and back pain.  Skin: Negative.   Neurological: Negative for sensory change and focal weakness.  Endo/Heme/Allergies: Negative.   Psychiatric/Behavioral: Negative for depression.  still complains of left leg pain. Past Medical History  Diagnosis Date  . PONV (postoperative nausea and vomiting)   . Hypertension   . Shortness of breath   . Anemia     WAS ANEMIC  . Blood transfusion     HAD 1 SEVERAL YRS AGO...3-4 YR  . Chronic kidney disease     SOME DECREASE IN KIDNEY FUNCTION  . Arthritis   . Normal cardiac stress test    REQUESTING COPY FROM [facility]  . Normal echocardiogram     REQUESTING THAT RESULT -- DR Park Breed    Past Surgical History  Procedure Date  . Abdominal hysterectomy   . Back surgery     2 CERVICAL SURGERIES ALSO  . Cataracts     RIGHT EYE CATARACT   History reviewed. No pertinent family history. Social History:  reports that she has never smoked. She does not have any smokeless tobacco history on file. She reports that she does not drink alcohol or use illicit drugs. Allergies:  Allergies  Allergen Reactions  . Acetaminophen Itching and Other (See Comments)    Stomach upset  . Aspirin Itching and Other (See Comments)    Stomach pain  . Other Other (See Comments)    Novocaine, strange feeling   Medications Prior to Admission  Medication Dose Route Frequency Provider Last Rate Last Dose  . 0.9 %  sodium chloride infusion  250 mL Intravenous Continuous Cristi Loron 50 mL/hr at 04/15/11 0323 250 mL at 04/15/11 0323  . acyclovir (ZOVIRAX) 200 MG capsule 400 mg  400 mg Oral Daily Severiano Gilbert, PHARMD      . acyclovir (ZOVIRAX) tablet 400 mg  400 mg Oral Daily Cristi Loron   400 mg at 04/14/11 2235  . ceFAZolin (ANCEF) IVPB 1 g/50 mL premix  1 g Intravenous Q8H Duane Lope Jenkins   1 g at 04/15/11 0037  . dexamethasone (DECADRON) injection 8 mg  8 mg Intravenous Once Cristi Loron   8 mg  at 04/14/11 1245  . diazepam (VALIUM) tablet 2 mg  2 mg Oral Q6H PRN Cristi Loron      . docusate sodium (COLACE) capsule 100 mg  100 mg Oral BID Cristi Loron   100 mg at 04/14/11 2233  . estrogens (conjugated) (PREMARIN) tablet 0.625 mg  0.625 mg Oral Daily Duane Lope Jenkins   0.625 mg at 04/14/11 2232  . gabapentin (NEURONTIN) capsule 300 mg  300 mg Oral Q breakfast Cristi Loron      . gabapentin (NEURONTIN) capsule 300 mg  300 mg Oral Q1400 Cristi Loron      . gabapentin (NEURONTIN) tablet 600 mg  600 mg Oral QHS Duane Lope Jenkins   600 mg at 04/14/11 2233  .  hydroxypropyl methylcellulose (ISOPTO TEARS) 2.5 % ophthalmic solution 1 drop  1 drop Both Eyes TID PRN Cristi Loron      . hydrOXYzine (ATARAX/VISTARIL) tablet 10 mg  10 mg Oral Daily PRN Cristi Loron      . losartan (COZAAR) tablet 50 mg  50 mg Oral Daily Cristi Loron   50 mg at 04/14/11 2233  . morphine 4 MG/ML injection 1-2 mg  1-2 mg Intravenous Q2H PRN Cristi Loron   1 mg at 04/14/11 2249  . multivitamins ther. w/minerals tablet 1 tablet  1 tablet Oral Daily Cristi Loron   1 tablet at 04/14/11 2235  . ondansetron (ZOFRAN) injection 4 mg  4 mg Intravenous Q4H PRN Cristi Loron   4 mg at 04/14/11 1654  . pantoprazole (PROTONIX) EC tablet 40 mg  40 mg Oral Daily Cristi Loron   40 mg at 04/14/11 2247  . promethazine (PHENERGAN) tablet 12.5 mg  12.5 mg Oral Q6H PRN Cristi Loron      . sodium chloride 0.9 % injection 3 mL  3 mL Intravenous PRN Cristi Loron      . traMADol (ULTRAM) tablet 50 mg  50 mg Oral Q6H PRN Cristi Loron   50 mg at 04/15/11 0048  . zolpidem (AMBIEN) tablet 5 mg  5 mg Oral QHS PRN Cristi Loron      . DISCONTD: 50,000 units bacitracin in 0.9% normal saline 250 mL irrigation    PRN Cristi Loron      . DISCONTD: bacitracin ointment    PRN Cristi Loron   1 application at 04/14/11 0944  . DISCONTD: Bupivacaine-Epinephrine PF (MARCAINE W/ EPI (PF)) 0.5-1:200000 % injection    PRN Cristi Loron   10 mL at 04/14/11 0906  . DISCONTD: ceFAZolin (ANCEF) IVPB 1 g/50 mL premix  1 g Intravenous 60 min Pre-Op Cristi Loron      . DISCONTD: dexamethasone (DECADRON) 10 MG/ML injection           . DISCONTD: gabapentin (NEURONTIN) capsule 300-600 mg  300-600 mg Oral QID Cristi Loron      . DISCONTD: hemostatic agents    PRN Cristi Loron   1 application at 04/14/11 1191  . DISCONTD: hemostatic agents    PRN Cristi Loron   1 application at 04/14/11 1014  . DISCONTD: HYDROmorphone (DILAUDID) injection 0.25-0.5 mg   0.25-0.5 mg Intravenous Q5 min PRN Judie Petit, MD   0.25 mg at 04/14/11 1121  . DISCONTD: HYDROmorphone (DILAUDID) injection 0.25-0.5 mg  0.25-0.5 mg Intravenous Q5 min PRN Judie Petit, MD   0.5 mg at 04/14/11 1135  . DISCONTD: HYDROmorphone (DILAUDID)  injection 0.25-0.5 mg  0.25-0.5 mg Intravenous Q5 min PRN Elwin Sleight, PHARMD   0.5 mg at 04/14/11 1150  . DISCONTD: lactated ringers infusion   Intravenous Continuous Cristi Loron      . DISCONTD: lactated ringers with KCl 20 mEq/L infusion   Intravenous Continuous Cristi Loron 30 mL/hr at 04/14/11 1515    . DISCONTD: morphine 2 MG/ML injection 0.05 mg/kg  0.05 mg/kg Intravenous Q10 min PRN Judie Petit, MD      . DISCONTD: morphine 2 MG/ML injection 0.05 mg/kg  0.05 mg/kg Intravenous Q10 min PRN Judie Petit, MD      . DISCONTD: sodium chloride 0.9 % injection 3 mL  3 mL Intravenous Q12H Cristi Loron       Medications Prior to Admission  Medication Sig Dispense Refill  . acyclovir (ZOVIRAX) 400 MG tablet Take 400 mg by mouth daily.        Marland Kitchen co-enzyme Q-10 50 MG capsule Take 200 mg by mouth daily.        Marland Kitchen estrogens, conjugated, (PREMARIN) 0.625 MG tablet Take 0.625 mg by mouth daily. Take daily for 21 days then do not take for 7 days.       . fish oil-omega-3 fatty acids 1000 MG capsule Take 1 g by mouth 2 (two) times daily.        Marland Kitchen gabapentin (NEURONTIN) 300 MG capsule Take 300-600 mg by mouth 4 (four) times daily.       . hydroxypropyl methylcellulose (ISOPTO TEARS) 2.5 % ophthalmic solution Place 1 drop into both eyes 3 (three) times daily as needed. Dry eyes       . hydrOXYzine (ATARAX/VISTARIL) 10 MG tablet Take 10 mg by mouth daily as needed. For itching       . losartan (COZAAR) 50 MG tablet Take 50 mg by mouth daily.        . Multiple Vitamins-Minerals (MULTIVITAMINS THER. W/MINERALS) TABS Take 1 tablet by mouth daily.        Marland Kitchen OVER THE COUNTER MEDICATION Take 1 tablet by mouth daily. Calcium plus  vitamin d 600/400mg        . traMADol (ULTRAM) 50 MG tablet Take 50 mg by mouth every 6 (six) hours as needed. Maximum dose= 8 tablets per day, for pain        Home: Home Living Lives With: Spouse Receives Help From: Personal care attendant Type of Home: House Home Layout: Two level;Able to live on main level with bedroom/bathroom Home Access: Ramped entrance Bathroom Shower/Tub: Walk-in shower Home Adaptive Equipment: Bedside commode/3-in-1;Shower chair with back;Walker - rolling  Functional History: Prior Function Level of Independence: Independent with gait;Needs assistance with homemaking Light Housekeeping: Minimal Driving: Yes Vocation: Retired Functional Status:  Mobility: Bed Mobility Bed Mobility: Yes Rolling Right: 4: Min assist Rolling Right Details (indicate cue type and reason): Education for log roll technique Right Sidelying to Sit: 4: Min assist Right Sidelying to Sit Details (indicate cue type and reason): Education for sequencing lower extremities and trunk. Difficulty inititating trunk Sitting - Scoot to Edge of Bed: 6: Modified independent (Device/Increase time) Sit to Supine - Right: 4: Min assist Sit to Supine - Right Details (indicate cue type and reason): Assistance to ensure patient lowers to side first prior to rolling to back and ensure no twisting Transfers Transfers: Yes Sit to Stand: 5: Supervision;With upper extremity assist;From bed Sit to Stand Details (indicate cue type and reason): Education for hand placement when using rolling walker Stand to  Sit: 5: Supervision;With upper extremity assist;To bed Stand to Sit Details: Verbal cues for hand placement Ambulation/Gait Ambulation/Gait: Yes Ambulation/Gait Assistance: 5: Supervision Ambulation/Gait Assistance Details (indicate cue type and reason): Verbal cues for posture and for safe distance from rolling walker - cues for step sequence Ambulation Distance (Feet): 150 Feet Assistive device:  Rolling walker Gait Pattern: Step-to pattern Gait velocity: Mild decreased spped secondary to left lower extremity pain    ADL:    Cognition: Cognition Arousal/Alertness: Awake/alert Orientation Level: Oriented X4 Cognition Arousal/Alertness: Awake/alert Overall Cognitive Status: Appears within functional limits for tasks assessed Orientation Level: Oriented X4  Blood pressure 140/53, pulse 85, temperature 98.1 F (36.7 C), temperature source Oral, resp. rate 18, SpO2 98.00%. @PHYSEXAMBYAGE2 @ Physical Exam  Constitutional: She is oriented to person, place, and time. She appears well-developed.  HENT:  Head: Normocephalic.  Eyes: Right eye exhibits no discharge.  Neck: Normal range of motion.  Cardiovascular: Normal rate.   Pulmonary/Chest: Effort normal. She has no wheezes.  Abdominal: Soft.  Musculoskeletal: She exhibits no edema.  Neurological: She is oriented to person, place, and time. A sensory deficit is present.  Reflex Scores:      Tricep reflexes are 2+ on the right side and 2+ on the left side.      Bicep reflexes are 2+ on the right side and 2+ on the left side.      Brachioradialis reflexes are 2+ on the right side and 2+ on the left side.      Patellar reflexes are 2+ on the right side and 1+ on the left side.      Achilles reflexes are 2+ on the right side and 1+ on the left side.      Continued pain in left leg.  Patient with giveaway weakness in the left lower ext (2-3/5 prox to 4/5 distally)  Distal sensory loss to pp/lt below knees bilaterally  Skin: Skin is warm and dry.  Psychiatric: She has a normal mood and affect.    No results found for this or any previous visit (from the past 24 hour(s)). Dg Lumbar Spine 1 View Neuro Rm 31-dr. Lovell Sheehan  04/14/2011  *RADIOLOGY REPORT*  Clinical Data: Lumbar laminectomy and decompression at L3-4.  OPERATIVE LUMBAR SPINE - 1 VIEW 04/14/2011:  Comparison: Lumbar spine x-rays 12/25/2010 [facility].  Findings: The prior images were not numbered, and there are six non- rib bearing lumbar type vertebrae.  For the purposes of this dictation I will assume that T12 has small, rudimentary ribs at that the last lumbar segment is L5.  If the prior images were numbered differently, please adjust accordingly.  Single lateral image obtained at 0915 hours and submitted for interpretation post-operatively demonstrates the localizer device between the L3 and L4 spinous processes, directed toward the L3-4 disc space which demonstrates vacuum phenomenon.  IMPRESSION: L3-4 localized intraoperatively.  Please see above comments regarding numbering of the lumbar spine.  Original Report Authenticated By: Arnell Sieving, M.D.    Assessment/Plan: Diagnosis: lumbar stenosis and radiculopathy 1. Does the need for close, 24 hr/day medical supervision in concert with the patient's rehab needs make it unreasonable for this patient to be served in a less intensive setting? No 2. Co-Morbidities requiring supervision/potential complications: neuropathy 3. Due to bladder management, bowel management, safety and skin/wound care, does the patient require 24 hr/day rehab nursing? No 4. Does the patient require coordinated care of a physician, rehab nurse, not applicable to address physical and functional  deficits in the context of the above medical diagnosis(es)? No Addressing deficits in the following areas: not app 5. Can the patient actively participate in an intensive therapy program of at least 3 hrs of therapy per day at least 5 days per week? Yes 6. The potential for patient to make measurable gains while on inpatient rehab is good 7. Anticipated functional outcomes upon discharge from inpatients are: not app 8. Estimated rehab length of stay to reach the above functional level. Not app 9. Does the patient have adequate social supports to accommodate these discharge functional goals? Yes 10. Anticipated  D/C setting: other 11. Anticipated post D/C treatments: HH therapy 12. Overall Rehab/Functional Prognosis: excellent  RECOMMENDATIONS: This patient's condition is appropriate for continued rehabilitative care in the following setting: SNF Patient has agreed to participate in recommended program. Yes Note that insurance prior authorization may be required for reimbursement for recommended care.  Comment: patient doesn't meet medical necessity to require CIR care.  Rec SNF   ANGIULLI,DANIEL J. 04/15/2011

## 2011-04-15 NOTE — Progress Notes (Signed)
Subjective:  The patient is alert pleasant and looks well. She does complain of some left leg pain. But it has improved.  Objective: Vital signs in last 24 hours: Temp:  [97.8 F (36.6 C)-98.2 F (36.8 C)] 97.8 F (36.6 C) (11/08 1600) Pulse Rate:  [75-100] 86  (11/08 1600) Resp:  [16-18] 18  (11/08 1600) BP: (105-140)/(34-76) 132/54 mmHg (11/08 1600) SpO2:  [95 %-100 %] 100 % (11/08 1600)  Intake/Output from previous day: 11/07 0701 - 11/08 0700 In: 1863.5 [P.O.:240; I.V.:1623.5] Out: 75 [Blood:75] Intake/Output this shift: Total I/O In: 600 [P.O.:600] Out: -   Physical exam the patient is alert and oriented her strength is normal in her bilateral lower extremities. Her dressing is dry and intact.  Lab Results: No results found for this basename: WBC:2,HGB:2,HCT:2,PLT:2 in the last 72 hours BMET No results found for this basename: NA:2,K:2,CL:2,CO2:2,GLUCOSE:2,BUN:2,CREATININE:2,CALCIUM:2 in the last 72 hours  Studies/Results: Dg Lumbar Spine 1 View Neuro Rm 31-dr. Lovell Sheehan  04/14/2011  *RADIOLOGY REPORT*  Clinical Data: Lumbar laminectomy and decompression at L3-4.  OPERATIVE LUMBAR SPINE - 1 VIEW 04/14/2011:  Comparison: Lumbar spine x-rays 12/25/2010 [facility].  Findings: The prior images were not numbered, and there are six non- rib bearing lumbar type vertebrae.  For the purposes of this dictation I will assume that T12 has small, rudimentary ribs at that the last lumbar segment is L5.  If the prior images were numbered differently, please adjust accordingly.  Single lateral image obtained at 0915 hours and submitted for interpretation post-operatively demonstrates the localizer device between the L3 and L4 spinous processes, directed toward the L3-4 disc space which demonstrates vacuum phenomenon.  IMPRESSION: L3-4 localized intraoperatively.  Please see above comments regarding numbering of the lumbar spine.  Original Report Authenticated By: Arnell Sieving, M.D.    Assessment/Plan: Postop day #1: The patient is doing well. OS PT and OT to see the patient. I will asked PM&R to see her as well.  LOS: 1 day     Renee Carpenter D 04/15/2011, 5:04 PM

## 2011-04-15 NOTE — Progress Notes (Signed)
Recommend SNF level rehabilitation or home with home health services and 24/7 assist of family. Does not need inpatient rehabilltation at this time.  Please call with any questions. Pager [phone].

## 2011-04-16 NOTE — Progress Notes (Signed)
Physical Therapy Treatment Patient Details Name: Renee Carpenter MRN: [id number] DOB: 1925-12-25 Today's Date: 04/16/2011  PT Assessment/Plan  PT - Assessment/Plan Comments on Treatment Session: Pt c/o of significant pain at beginning of session & states pain is worse today than yesterday but pain decreasing as session progressed.   PT Plan: Discharge plan remains appropriate PT Frequency: Min 5X/week Follow Up Recommendations: None Equipment Recommended: None recommended by PT PT Goals  Acute Rehab PT Goals PT Goal: Rolling Supine to Right Side - Progress: Progressing toward goal PT Goal: Supine/Side to Sit - Progress: Progressing toward goal PT Transfer Goal: Sit to Stand/Stand to Sit - Progress: Progressing toward goal PT Goal: Ambulate - Progress: Progressing toward goal Additional Goals PT Goal: Additional Goal #1 - Progress: Progressing toward goal  PT Treatment Precautions/Restrictions  Precautions Precautions: Back Precaution Booklet Issued: Yes (comment) (reviewed back precautions handout) Precaution Comments: Independently verbalized 3/3 back back precations Required Braces or Orthoses: No Mobility (including Balance) Bed Mobility Bed Mobility: Yes Rolling Right: Other (comment) (min guard) Rolling Right Details (indicate cue type and reason): cues to reinforce technique & precautions Right Sidelying to Sit: Other (comment);With rails;HOB flat (min guard) Right Sidelying to Sit Details (indicate cue type and reason): cues to reinforce precautions/technique; pt c/o of significant pain with transition Sitting - Scoot to Edge of Bed: 6: Modified independent (Device/Increase time) Transfers Transfers: Yes Sit to Stand: Other (comment);From bed;With upper extremity assist (min guard) Sit to Stand Details (indicate cue type and reason): min guard for safety due to pt c/o of significant pain; cues for hand placement & safe technique Stand to Sit: With upper extremity  assist;To chair/3-in-1 (min guard) Stand to Sit Details: min guard for safety due to pain; cues for hand placement & safe technique Ambulation/Gait Ambulation/Gait: Yes Ambulation/Gait Assistance: Other (comment) (min guard) Ambulation/Gait Assistance Details (indicate cue type and reason): cues for upright posture, stay inside RW Ambulation Distance (Feet): 120 Feet Assistive device: Rolling walker Gait Pattern: Step-to pattern (mild trunk flexion) Stairs: No Wheelchair Mobility Wheelchair Mobility: No    Exercise    End of Session PT - End of Session Equipment Utilized During Treatment: Gait belt Activity Tolerance: Patient limited by pain (pt states pain worse today but eased as session progressed) Patient left: in chair Nurse Communication: Other (comment) (pain) General Behavior During Session: [facility] for tasks performed Cognition: [facility] for tasks performed  Lara Mulch 04/16/2011, 11:49 AM

## 2011-04-16 NOTE — Progress Notes (Signed)
Occupational Therapy Treatment Patient Details Name: Renee Carpenter MRN: [id number] DOB: [birth year] Today's Date: 04/16/2011  OT Assessment/Plan OT Assessment/Plan OT Plan: Discharge plan remains appropriate OT Frequency: Min 2X/week Follow Up Recommendations: None Equipment Recommended: None recommended by PT OT Goals Acute Rehab OT Goals OT Goal Formulation: With patient Time For Goal Achievement: 7 days ADL Goals ADL Goal: Tub/Shower Transfer - Progress: Progressing toward goals  OT Treatment Precautions/Restrictions  Precautions Precautions: Back Precaution Comments: Independently verbalized 3/3 back back precations Required Braces or Orthoses: No   ADL ADL Tub/Shower Transfer: Performed;Supervision/safety Tub/Shower Transfer Method: Stand pivot;Ambulating Tub/Shower Transfer Equipment: Grab bars;Walk in shower;Shower seat with back Equipment Used: Rolling walker Mobility  Bed Mobility Bed Mobility: Yes Rolling Right: Other (comment) (min guard) Rolling Right Details (indicate cue type and reason): cues to reinforce technique & precautions Right Sidelying to Sit: Other (comment);With rails;HOB flat (min guard) Right Sidelying to Sit Details (indicate cue type and reason): cues to reinforce precautions/technique; pt c/o of significant pain with transition Sitting - Scoot to Edge of Bed: 6: Modified independent (Device/Increase time) Transfers Sit to Stand: Other (comment);From bed;With upper extremity assist (min guard) Sit to Stand Details (indicate cue type and reason): min guard for safety due to pt c/o of significant pain; cues for hand placement & safe technique Stand to Sit: With upper extremity assist;To chair/3-in-1 (min guard) Stand to Sit Details: min guard for safety due to pain; cues for hand placement & safe technique Exercises    End of Session OT - End of Session Activity Tolerance: Patient tolerated treatment well Patient left: in bed;with call  bell in reach General Behavior During Session: [facility] for tasks performed Cognition: [facility] for tasks performed  Robet Leu COTA/L [phone] 04/16/2011, 12:47 PM

## 2011-04-16 NOTE — Progress Notes (Signed)
CSW received consult for possible SNF. CSW met with pt. For further information, please see Clinical Social Worker's Brief Psychosocial Assessment. CSW will continue to follow for discharge needs and psychosocial support.   Dede Query, MSW, [facility] [phone]

## 2011-04-16 NOTE — Progress Notes (Signed)
Subjective:  The patient is alert and pleasant. She complains of left leg pain. She is not ready to go home.   Objective: Vital signs in last 24 hours: Temp:  [97.8 F (36.6 C)-98.5 F (36.9 C)] 98.2 F (36.8 C) (11/09 0400) Pulse Rate:  [75-86] 75  (11/09 0400) Resp:  [14-18] 16  (11/09 0400) BP: (130-149)/(48-64) 130/48 mmHg (11/09 0400) SpO2:  [95 %-100 %] 95 % (11/09 0400)  Intake/Output from previous day: 11/08 0701 - 11/09 0700 In: 840 [P.O.:840] Out: -  Intake/Output this shift:    Physical exam the patient's motor strength is normal in her bilateral lower extremities. Her dressing is dry and intact.  Lab Results: No results found for this basename: WBC:2,HGB:2,HCT:2,PLT:2 in the last 72 hours BMET No results found for this basename: NA:2,K:2,CL:2,CO2:2,GLUCOSE:2,BUN:2,CREATININE:2,CALCIUM:2 in the last 72 hours  Studies/Results: Dg Lumbar Spine 1 View Neuro Rm 31-dr. Lovell Sheehan  04/14/2011  *RADIOLOGY REPORT*  Clinical Data: Lumbar laminectomy and decompression at L3-4.  OPERATIVE LUMBAR SPINE - 1 VIEW 04/14/2011:  Comparison: Lumbar spine x-rays 12/25/2010 [facility].  Findings: The prior images were not numbered, and there are six non- rib bearing lumbar type vertebrae.  For the purposes of this dictation I will assume that T12 has small, rudimentary ribs at that the last lumbar segment is L5.  If the prior images were numbered differently, please adjust accordingly.  Single lateral image obtained at 0915 hours and submitted for interpretation post-operatively demonstrates the localizer device between the L3 and L4 spinous processes, directed toward the L3-4 disc space which demonstrates vacuum phenomenon.  IMPRESSION: L3-4 localized intraoperatively.  Please see above comments regarding numbering of the lumbar spine.  Original Report Authenticated By: Arnell Sieving, M.D.    Assessment/Plan: Postop day #2; the patient will need a few more days  of PT OT. She is being evaluated for inpatient rehabilitation. He'll transfer her to [facility].  LOS: 2 days     Tymon Nemetz D 04/16/2011, 7:12 AM

## 2011-04-17 LAB — GLUCOSE, CAPILLARY
Glucose-Capillary: 111 mg/dL — ABNORMAL HIGH (ref 70–99)
Glucose-Capillary: 150 mg/dL — ABNORMAL HIGH (ref 70–99)
Glucose-Capillary: 179 mg/dL — ABNORMAL HIGH (ref 70–99)

## 2011-04-17 MED ORDER — GABAPENTIN 600 MG PO TABS
600.0000 mg | ORAL_TABLET | Freq: Three times a day (TID) | ORAL | Status: DC
  Administered 2011-04-17: 300 mg via ORAL
  Administered 2011-04-17 – 2011-04-19 (×7): 600 mg via ORAL
  Filled 2011-04-17 (×9): qty 1

## 2011-04-17 NOTE — Progress Notes (Signed)
Physical Therapy Treatment Patient Details Name: Renee Carpenter MRN: [id number] DOB: 1925-08-22 Today's Date: 04/17/2011  PT Assessment/Plan  PT - Assessment/Plan Comments on Treatment Session: Spoke with pt regarding D/C plans & pt states that she is aware of her slow progression and that she would benefit from STR-SNF before d/c'ing home.  Pt states that she would like to go to [facility]	[place] for rehab.  D/C plans updated.   PT Plan: Discharge plan needs to be updated PT Frequency: Min 5X/week Follow Up Recommendations: updated to Skilled nursing facility (pt would like [facility]	[place]) Equipment Recommended: Defer to next venue PT Goals  Acute Rehab PT Goals PT goal: Bed mobility- not progressing- required increased assistance today PT Transfer Goal: Sit to Stand/Stand to Sit - Progress: Other (comment) (not progressing today- increased assistance needed) PT Goal: Ambulate - Progress: Other (comment) (not progressing today) Additional Goals PT Goal: Additional Goal #1 - Progress: Progressing toward goal  PT Treatment Precautions/Restrictions  Precautions Precautions: Back Precaution Booklet Issued: Yes (comment) (reviewed back precautions handout) Precaution Comments: independently verbalized 3/3 back precautions Required Braces or Orthoses: No Mobility (including Balance) Bed Mobility Bed Mobility: Yes Left Sidelying to Sit: 4: Min assist;HOB flat Left Sidelying to Sit Details (indicate cue type and reason): assistance to iniate/carry out rolling & maintain adherence to precautions.  assistance to lift shoulders/trunk to sitting.  pt required increased assistance today with bed mobility.   Sitting - Scoot to Edge of Bed: 6: Modified independent (Device/Increase time) Transfers Sit to Stand: 4: Min assist;From bed;With upper extremity assist Sit to Stand Details (indicate cue type and reason): assistance to achieve standing, balance, steady RW; seemed to have increased  difficulty achieving hip/knee ext today Stand to Sit: 4: Min assist Stand to Sit Details: assistance to control descent Ambulation/Gait Ambulation/Gait: Yes Ambulation/Gait Assistance: 4: Min assist Ambulation/Gait Assistance Details (indicate cue type and reason): assistance for balance & safety Ambulation Distance (Feet): 120 Feet Assistive device: Rolling walker Gait Pattern: Decreased step length - right;Decreased step length - left;Decreased stride length;Left flexed knee in stance;Right flexed knee in stance;Step-to pattern    Exercise    End of Session PT - End of Session Equipment Utilized During Treatment: Gait belt Activity Tolerance: Patient limited by pain;Other (comment) (?? weakness & pain) Patient left: in chair General Behavior During Session: [facility] for tasks performed Cognition: [facility] for tasks performed  Lara Mulch 04/17/2011, 12:44 PM

## 2011-04-17 NOTE — Progress Notes (Signed)
Renee Carpenter, PT  Pager [phone]

## 2011-04-17 NOTE — Progress Notes (Signed)
  Doing well. C/o appropriate incisional soreness. Still with neuropathic  pain  No Nausea /vomiting Starting to Am  voiding well  Temp:  [97.7 F (36.5 C)-98.7 F (37.1 C)] 98.2 F (36.8 C) (11/10 0500) Pulse Rate:  [72-89] 73  (11/10 0500) Resp:  [14-20] 17  (11/10 0500) BP: (127-151)/(36-87) 145/71 mmHg (11/10 0500) SpO2:  [94 %-100 %] 96 % (11/10 0500) Good strength and sensation Incision CDI  Plan: Increase activity - increase neurontin - maybe SNF placement

## 2011-04-18 NOTE — Progress Notes (Signed)
  Doing well. C/o appropriate incisional soreness. Continues with L hip/leg pain No Nausea /vomiting Amb/ voiding well  Temp:  [97.7 F (36.5 C)-99.2 F (37.3 C)] 97.9 F (36.6 C) (11/11 0622) Pulse Rate:  [73-112] 73  (11/11 0622) Resp:  [18-22] 20  (11/11 0622) BP: (115-142)/(45-70) 115/45 mmHg (11/11 0622) SpO2:  [97 %-98 %] 98 % (11/11 0622)  Incision CDI  Plan: CPM, Increase activity

## 2011-04-19 ENCOUNTER — Encounter: Payer: Self-pay | Admitting: Internal Medicine

## 2011-04-19 MED ORDER — DIAZEPAM 2 MG PO TABS: 2.0000 mg | ORAL_TABLET | Freq: Four times a day (QID) | ORAL | Status: AC | PRN

## 2011-04-19 MED ORDER — OXYCODONE-ACETAMINOPHEN 5-325 MG PO TABS: 1.0000 | ORAL_TABLET | ORAL | Status: AC | PRN

## 2011-04-19 MED ORDER — GABAPENTIN 600 MG PO TABS: 600.0000 mg | ORAL_TABLET | Freq: Three times a day (TID) | ORAL | Status: DC

## 2011-04-19 MED ORDER — DSS 100 MG PO CAPS: 100.0000 mg | ORAL_CAPSULE | Freq: Two times a day (BID) | ORAL | Status: AC

## 2011-04-19 NOTE — Discharge Summary (Signed)
  Brief history: The patient is an 77 year old white female who has suffered from back left hip and leg pain. She has failed medical management. She was worked up with a lumbar MRI which demonstrated multilevel spondylosis and stenosis. I discussed the various treatment options with her. The patient has decided proceed with a lumbar laminectomy.  Past medical history, past surgical history, family history, social history, etc.,: See the history and physical.  Hospital course: I admitted the patient to [facility] and performed a decompressive lumbar laminectomy. The surgery went well. For full  details of this operation please refer to typed operative note.  Postoperative course: The patient's postoperative course was remarkable only for some persistent left leg pain. This was treated with gabapentin. We had PT and OT see the patient. I also asked PMNR to see the patient. They didn't think she needed inpatient rehabilitation. Arrangements were made for the patient to go to a nursing home in [place], [facility]. The patient was transferred to the nursing home on 04/19/2011.

## 2011-04-19 NOTE — Progress Notes (Signed)
CSW provided bed offers to pt. Pt chose [facility]	[place] for SNF Rehab. CSW will follow up with facility on pt's choice. CSW will continue to follow.   Dede Query, MSW, [facility] [phone]

## 2011-04-19 NOTE — Progress Notes (Signed)
Epic support clicked on marked as reviewed in the allergy section under my login when trying to fix a patient concern / issue.

## 2011-04-21 ENCOUNTER — Encounter (HOSPITAL_COMMUNITY): Payer: Self-pay | Admitting: Neurosurgery

## 2011-05-08 ENCOUNTER — Encounter: Payer: Self-pay | Admitting: Internal Medicine

## 2011-05-17 ENCOUNTER — Encounter: Payer: Self-pay | Admitting: Internal Medicine

## 2011-06-08 ENCOUNTER — Encounter: Payer: Self-pay | Admitting: Internal Medicine

## 2011-06-09 ENCOUNTER — Encounter: Payer: Self-pay | Admitting: Internal Medicine

## 2011-06-16 ENCOUNTER — Ambulatory Visit: Payer: Self-pay | Admitting: Family Medicine

## 2011-07-09 ENCOUNTER — Encounter: Payer: Self-pay | Admitting: Internal Medicine

## 2011-08-06 ENCOUNTER — Encounter: Payer: Self-pay | Admitting: Internal Medicine

## 2011-09-06 ENCOUNTER — Encounter: Payer: Self-pay | Admitting: Internal Medicine

## 2011-09-22 ENCOUNTER — Ambulatory Visit: Payer: Self-pay | Admitting: Neurosurgery

## 2011-11-19 DIAGNOSIS — M5137 Other intervertebral disc degeneration, lumbosacral region: Secondary | ICD-10-CM | POA: Insufficient documentation

## 2011-11-19 DIAGNOSIS — M51379 Other intervertebral disc degeneration, lumbosacral region without mention of lumbar back pain or lower extremity pain: Secondary | ICD-10-CM | POA: Insufficient documentation

## 2012-01-17 ENCOUNTER — Ambulatory Visit: Payer: Self-pay | Admitting: Family Medicine

## 2012-02-10 DIAGNOSIS — M48061 Spinal stenosis, lumbar region without neurogenic claudication: Secondary | ICD-10-CM | POA: Insufficient documentation

## 2012-03-03 DIAGNOSIS — D649 Anemia, unspecified: Secondary | ICD-10-CM | POA: Insufficient documentation

## 2012-05-02 DIAGNOSIS — Z981 Arthrodesis status: Secondary | ICD-10-CM | POA: Insufficient documentation

## 2012-07-18 DIAGNOSIS — M5410 Radiculopathy, site unspecified: Secondary | ICD-10-CM | POA: Insufficient documentation

## 2012-08-01 ENCOUNTER — Ambulatory Visit: Payer: Self-pay | Admitting: Oncology

## 2012-08-03 LAB — CBC CANCER CENTER
Eosinophil #: 0.1 x10 3/mm (ref 0.0–0.7)
Eosinophil %: 0.8 %
HCT: 30.6 % — ABNORMAL LOW (ref 35.0–47.0)
HGB: 10.5 g/dL — ABNORMAL LOW (ref 12.0–16.0)
MCH: 38.8 pg — ABNORMAL HIGH (ref 26.0–34.0)
MCHC: 34.2 g/dL (ref 32.0–36.0)
Neutrophil #: 4.5 x10 3/mm (ref 1.4–6.5)
Neutrophil %: 52.4 %
Platelet: 239 x10 3/mm (ref 150–440)

## 2012-08-03 LAB — IRON AND TIBC: Unbound Iron-Bind.Cap.: 153 ug/dL

## 2012-08-05 ENCOUNTER — Ambulatory Visit: Payer: Self-pay | Admitting: Oncology

## 2012-10-26 ENCOUNTER — Encounter: Payer: Self-pay | Admitting: Neurology

## 2012-10-26 ENCOUNTER — Ambulatory Visit (INDEPENDENT_AMBULATORY_CARE_PROVIDER_SITE_OTHER): Payer: Medicare Other | Admitting: Neurology

## 2012-10-26 VITALS — BP 151/70 | HR 76 | Temp 97.2°F | Ht 65.0 in | Wt 164.0 lb

## 2012-10-26 DIAGNOSIS — M549 Dorsalgia, unspecified: Secondary | ICD-10-CM

## 2012-10-26 DIAGNOSIS — G2581 Restless legs syndrome: Secondary | ICD-10-CM

## 2012-10-26 DIAGNOSIS — R269 Unspecified abnormalities of gait and mobility: Secondary | ICD-10-CM

## 2012-10-26 DIAGNOSIS — G589 Mononeuropathy, unspecified: Secondary | ICD-10-CM

## 2012-10-26 MED ORDER — ROPINIROLE HCL 1 MG PO TABS: ORAL_TABLET | ORAL | Status: DC

## 2012-10-26 NOTE — Patient Instructions (Addendum)
I think overall you are doing fairly well but I do want to suggest a few things today:  Remember to drink plenty of fluid, eat healthy meals and do not skip any meals. Try to eat protein with a every meal and eat a healthy snack such as fruit or nuts in between meals. Try to keep a regular sleep-wake schedule and try to exercise daily, particularly in the form of walking, 20-30 minutes a day, if you can.   As far as your medications are concerned, I would like to suggest taking Requip 0.5 mg at 5 PM and 1 mg at 8 PM. Pls check on your medication bottle for gabapentin the strength: 300 mg vs. 600 mg    As far as diagnostic testing: no new test for now.   Please make an appointment with a podiatrist.   I would like to see you back in 3 months, sooner if we need to. Please call us with any interim questions, concerns, problems, updates or refill requests.  Brett Canales is my clinical assistant and will answer any of your questions and relay your messages to me and also relay most of my messages to you.  Our phone number is [phone]. We also have an after hours call service for urgent matters and there is a physician on-call for urgent questions. For any emergencies you know to call 911 or go to the nearest emergency room.

## 2012-10-26 NOTE — Progress Notes (Signed)
Subjective:    Patient ID: Renee Carpenter is a 79 y.o. female.  HPI  Interim history:   Renee Carpenter is an 79 year old right-handed woman who presents for followup consultation of her peripheral neuropathy, restless leg symptoms, lumbar radiculopathy. She's unaccompanied today. This is her first visit with me and she previously and followed by Dr. Fayrene Fearing love and was last seen by him on 07/16/2011, at which time he felt that her symptoms were a combination of her neuropathy her RLS, her radiculopathy. He increased her gabapentin to 600 mg 4 times a day, advising her about side effects. Falls assessment total score at the time was 16. He gave her information about fall prevention. She has an underlying medical history of migraines, diabetes, anxiety, hypertension, chronic pain. Her current medications are tramadol, multivitamin, Premarin, Nexium, gabapentin, calcium, coenzyme Q 10.  I reviewed Dr. Imagene Gurney prior notes and the patient's records and below is a summary of that review:  79 year old right-handed woman with a history of essential tremor and peripheral neuropathy. EMG nerve conduction study was done in the past by Dr. Anne Hahn. B12 level, thyroid panel, ferritin and hemoglobin A1c were normal in the past. But she was diagnosed with diabetes in February 2008. She has been on gabapentin for many years. She has lower back pain.She has been using a walker. MRI neck was done in the past and she had a cervical spine operation in 1998. She has numbness in both feet, left greater than right. She has gone through physical therapy for her back. She has complaints of restless leg symptoms in her legs extending to her thighs and even higher.  She had back surgery in 9/13 in [facility] and has been using her walker outside the house and for longer distances. She was up to qid with gabapentin 600 mg and now on tid, as she wanted to reduce it. She has been on Requip 1 mg at night. Her sister has RLS too.  Sister had breast cancer, HTN, DM. She has residual pain in her feet as well. She has a handicapped daughter with microcephaly, who is in a group home and she lost a son from AIDS and another child died in infancy from congenital heart d/s.   Her Past Medical History Is Significant For: Past Medical History  Diagnosis Date  . PONV (postoperative nausea and vomiting)   . Hypertension   . Shortness of breath   . Anemia     WAS ANEMIC  . Blood transfusion     HAD 1 SEVERAL YRS AGO...3-4 YR  . Chronic kidney disease     SOME DECREASE IN KIDNEY FUNCTION  . Arthritis   . Normal cardiac stress test     REQUESTING COPY FROM [facility]  . Normal echocardiogram     REQUESTING THAT RESULT -- DR Park Breed     Her Past Surgical History Is Significant For: Past Surgical History  Procedure Laterality Date  . Abdominal hysterectomy    . Back surgery      2 CERVICAL SURGERIES ALSO  . Cataracts      RIGHT EYE CATARACT  . Lumbar laminectomy/decompression microdiscectomy  04/14/2011    Procedure: LUMBAR LAMINECTOMY/DECOMPRESSION MICRODISCECTOMY;  Surgeon: Cristi Loron;  Location: [facility];  Service: Neurosurgery;  Laterality: N/A;  Lumbar Three and Lumbar Four Laminectomy    Her Family History Is Significant For: No family history on file.  Her Social History Is Significant For: History   Social History  . Marital  Status: Married    Spouse Name: N/A    Number of Children: N/A  . Years of Education: N/A   Social History Main Topics  . Smoking status: Never Smoker   . Smokeless tobacco: None  . Alcohol Use: No  . Drug Use: No  . Sexually Active: No   Other Topics Concern  . None   Social History Narrative  . None    Her Allergies Are:  Allergies  Allergen Reactions  . Acetaminophen Itching and Other (See Comments)    Stomach upset  . Aspirin Itching and Other (See Comments)    Stomach pain  . Other Other (See Comments)    Novocaine, strange feeling  :   Her  Current Medications Are:  Outpatient Encounter Prescriptions as of 10/26/2012  Medication Sig Dispense Refill  . co-enzyme Q-10 50 MG capsule Take 200 mg by mouth daily.        Marland Kitchen esomeprazole (NEXIUM) 40 MG capsule Take 40 mg by mouth daily before breakfast.        . estrogens, conjugated, (PREMARIN) 0.625 MG tablet Take 0.625 mg by mouth daily. Take daily for 21 days then do not take for 7 days.       . fish oil-omega-3 fatty acids 1000 MG capsule Take 1 g by mouth 2 (two) times daily.        Marland Kitchen gabapentin (NEURONTIN) 600 MG tablet Take 1 tablet (600 mg total) by mouth every 8 (eight) hours.  90 tablet  1  . hydroxypropyl methylcellulose (ISOPTO TEARS) 2.5 % ophthalmic solution Place 1 drop into both eyes 3 (three) times daily as needed. Dry eyes       . hydrOXYzine (ATARAX/VISTARIL) 10 MG tablet Take 10 mg by mouth daily as needed. For itching       . losartan (COZAAR) 50 MG tablet Take 50 mg by mouth daily.        Marland Kitchen losartan-hydrochlorothiazide (HYZAAR) 50-12.5 MG per tablet Take 1 tablet by mouth daily.      . magnesium oxide (MAG-OX) 400 MG tablet Take 400 mg by mouth daily.      . Multiple Vitamins-Minerals (MULTIVITAMINS THER. W/MINERALS) TABS Take 1 tablet by mouth daily.        Marland Kitchen OVER THE COUNTER MEDICATION Take 1 tablet by mouth daily. Calcium plus vitamin d 600/400mg        . rOPINIRole (REQUIP) 1 MG tablet Take 1/2 pill at 5 PM and 1 pill at 8 PM daily.  45 tablet  5  . sertraline (ZOLOFT) 50 MG tablet       . [DISCONTINUED] rOPINIRole (REQUIP) 1 MG tablet       . acyclovir (ZOVIRAX) 400 MG tablet Take 400 mg by mouth daily.        . traMADol (ULTRAM) 50 MG tablet Take 50 mg by mouth every 6 (six) hours as needed. Maximum dose= 8 tablets per day, for pain      . venlafaxine XR (EFFEXOR-XR) 75 MG 24 hr capsule        No facility-administered encounter medications on file as of 10/26/2012.    Review of Systems  Constitutional: Positive for fatigue.  HENT: Positive for hearing loss,  rhinorrhea and trouble swallowing.   Respiratory: Positive for shortness of breath.        Snoring  Musculoskeletal:       Cramps  Skin:       Itching  Neurological: Positive for tremors, weakness and numbness.  Restless leg  Hematological: Bruises/bleeds easily.       Anemia  Psychiatric/Behavioral: The patient is nervous/anxious.     Objective:  Neurologic Exam  Physical Exam Physical Examination:   Filed Vitals:   10/26/12 1457  BP: 151/70  Pulse: 76  Temp: 97.2 F (36.2 C)    General Examination: The patient is a very pleasant 79 y.o. female in no acute distress. She appears well-developed and well-nourished and very well groomed.   HEENT: Normocephalic, atraumatic, pupils are equal, round and reactive to light and accommodation. Extraocular tracking is good without limitation to gaze excursion or nystagmus noted. Normal smooth pursuit is noted. Hearing is grossly intact. Face is symmetric with normal facial animation and normal facial sensation. Speech is clear with no dysarthria noted. There is no hypophonia. There is no lip, neck/head, jaw or voice tremor. Neck is supple with full range of passive and active motion. There are no carotid bruits on auscultation. Oropharynx exam reveals: mild mouth dryness, adequate dental hygiene and no significant airway crowding. Mallampati is class II. Tongue protrudes centrally and palate elevates symmetrically.   Chest: Clear to auscultation without wheezing, rhonchi or crackles noted.  Heart: S1+S2+0, regular and normal without murmurs, rubs or gallops noted.   Abdomen: Soft, non-tender and non-distended with normal bowel sounds appreciated on auscultation.  Extremities: There is no pitting edema in the distal lower extremities bilaterally.   Skin: Warm and dry without trophic changes noted. There are no varicose veins, but she has spider veins.  Musculoskeletal: exam reveals no obvious joint deformities, tenderness or joint  swelling or erythema.   Neurologically:  Mental status: The patient is awake, alert and oriented in all 4 spheres. Her memory, attention, language and knowledge are appropriate. There is no aphasia, agnosia, apraxia or anomia. Speech is clear with normal prosody and enunciation. Thought process is linear. Mood is congruent and affect is normal.  Cranial nerves are as described above under HEENT exam. In addition, shoulder shrug is normal with equal shoulder height noted. Motor exam: Normal bulk, strength and tone is noted. There is no drift, tremor or rebound. Romberg is negative. Reflexes are 2+ throughout. Toes are downgoing bilaterally. Fine motor skills are intact with normal finger taps, normal hand movements, normal rapid alternating patting, but with mildly impaired  foot taps and normal foot agility.  Cerebellar testing shows no dysmetria or intention tremor on finger to nose testing. There is no truncal or gait ataxia.  Sensory exam: She has decreased sensation to light touch, pinprick, vibration and temperature sense in the distal lower extremities in the lower one third. She is sensitive to touch in the bottom of her feet. She also has a cracked callous on her left sole of her foot. There is a little bit of redness around it. She has intact sensation to her upper extremities.  Gait, station and balance: She stands up with mild difficulty and her posture is moderately stooped with increased kyphosis in her upper back. She walks with her walker fairly well. She is less SICU or and more cautious without the walker. She turns in 3 steps. Tandem walk is not possible. Balance is mildly impaired.  Assessment and Plan:   Assessment and Plan:  In summary, EMAN RYNDERS is a very pleasant 79 y.o.-year old female with a history of gait disorder, chronic lower back pain status post surgeries, and restless leg syndrome, progressive and neuropathy. A long chat with her today and her exam is fairly  stable at this time but she does have residual pain and restless leg symptoms. I suggested that she take an additional half milligram of Requip at 5 PM and take 1 mg at 8 PM. I've asked her to improve her sleep and wake schedule and talked to her about improving her sleep hygiene. She is advised to try to shoot for a bedtime between 10 and 11 PM. I also informed her that there is sometimes augmentation when dopamine agonists are used for restless leg symptoms. As far as her gabapentin is concerned she says that she is taking 300 mg 3 times a day at this point. Her prescription indicates 600 mg 4 times a day and she did take the medicine 4 times a day but still claims that she is taking only 300 mg strength. At any rate I would like for her to go back home and check on her medication bottle and there may be a possibility to increase her gabapentin to improve her residual neuropathy pain. I have asked her to followup with me in 3 months from now, sooner if the need arises and call us with any interim questions, concerns, problems, updates of refill requests. She was in agreement.

## 2012-11-27 ENCOUNTER — Encounter: Payer: Self-pay | Admitting: Family Medicine

## 2012-12-05 ENCOUNTER — Encounter: Payer: Self-pay | Admitting: Family Medicine

## 2013-01-05 ENCOUNTER — Encounter: Payer: Self-pay | Admitting: Family Medicine

## 2013-01-29 ENCOUNTER — Telehealth: Payer: Self-pay | Admitting: Neurology

## 2013-01-29 ENCOUNTER — Ambulatory Visit: Payer: Medicare Other | Admitting: Neurology

## 2013-01-30 NOTE — Telephone Encounter (Signed)
Called patient to let her know I will talking with Dr. Frances Furbish to see when can we put the patient in on her schedule. No one answered i will call the patient back at a later time with a appointment.

## 2013-02-01 ENCOUNTER — Ambulatory Visit: Payer: Medicare Other | Admitting: Neurology

## 2013-02-05 ENCOUNTER — Encounter: Payer: Self-pay | Admitting: Family Medicine

## 2013-03-07 ENCOUNTER — Encounter: Payer: Self-pay | Admitting: Family Medicine

## 2013-06-22 ENCOUNTER — Telehealth: Payer: Self-pay | Admitting: Neurology

## 2013-06-22 NOTE — Telephone Encounter (Signed)
Caregiver calling wanting to schedule patient with Dr. Frances FurbishAthar for neuropathy - please call [phone] ext [phone]

## 2013-06-24 ENCOUNTER — Emergency Department: Payer: Self-pay | Admitting: Emergency Medicine

## 2013-06-25 NOTE — Telephone Encounter (Signed)
Spoke to Maralyn SagoSarah at patient's PCP office. Patient has increased neuropathy issues. Scheduled appt w/ Dr. Frances FurbishAthar.

## 2013-06-28 ENCOUNTER — Ambulatory Visit (INDEPENDENT_AMBULATORY_CARE_PROVIDER_SITE_OTHER): Payer: Medicare Other | Admitting: Neurology

## 2013-06-28 ENCOUNTER — Encounter (INDEPENDENT_AMBULATORY_CARE_PROVIDER_SITE_OTHER): Payer: Self-pay

## 2013-06-28 ENCOUNTER — Encounter: Payer: Self-pay | Admitting: Neurology

## 2013-06-28 VITALS — BP 123/65 | HR 86 | Temp 97.3°F | Ht 65.0 in | Wt 164.0 lb

## 2013-06-28 DIAGNOSIS — G2581 Restless legs syndrome: Secondary | ICD-10-CM

## 2013-06-28 DIAGNOSIS — L84 Corns and callosities: Secondary | ICD-10-CM

## 2013-06-28 DIAGNOSIS — M549 Dorsalgia, unspecified: Secondary | ICD-10-CM

## 2013-06-28 DIAGNOSIS — G609 Hereditary and idiopathic neuropathy, unspecified: Secondary | ICD-10-CM

## 2013-06-28 DIAGNOSIS — R269 Unspecified abnormalities of gait and mobility: Secondary | ICD-10-CM

## 2013-06-28 MED ORDER — ROPINIROLE HCL 1 MG PO TABS: ORAL_TABLET | ORAL | Status: DC

## 2013-06-28 MED ORDER — GABAPENTIN 600 MG PO TABS: 600.0000 mg | ORAL_TABLET | Freq: Three times a day (TID) | ORAL | Status: DC

## 2013-06-28 NOTE — Patient Instructions (Addendum)
I think overall you are doing fairly well but I do want to suggest a few things today:  Remember to drink plenty of fluid, eat healthy meals and do not skip any meals. Try to eat protein with a every meal and eat a healthy snack such as fruit or nuts in between meals. Try to keep a regular sleep-wake schedule and try to exercise daily, particularly in the form of walking, 20-30 minutes a day, if you can.   Engage in social activities in your community and with your family and try to keep up with current events by reading the newspaper or watching the news.   As far as your medications are concerned, I would like to suggest the following changes: take gabapentin 600 mg strength; take one at 8 am, 2 PM and 8 PM. Requip 1 mg strength: take 1/2 pill at 5 PM and 1 pill at 8 PM.     As far as diagnostic testing: no new test.    I would like to see you back in 4 months, sooner if we need to. Please call us with any interim questions, concerns, problems, updates or refill requests.  Our nursing staff will answer any of your questions and relay your messages to me and also relay most of my messages to you.  Our phone number is [phone]. We also have an after hours call service for urgent matters and there is a physician on-call for urgent questions. For any emergencies you know to call 911 or go to the nearest emergency room.   Please ask your primary care doctor about seeing a podiatrist in your area.

## 2013-06-28 NOTE — Progress Notes (Signed)
Subjective:    Patient ID: Renee Carpenter is a 80 y.o. female.  HPI  Interim history:   Renee Carpenter is an 80 year old right-handed woman who presents for followup consultation of Renee Carpenter peripheral neuropathy, and history of RLS, in the context of lumbar radiculopathy. She is unaccompanied today. I first met Renee Carpenter on 10/26/12, at which time I felt that Renee Carpenter exam was fairly stable but she did report residual back pain and restless leg symptoms. I suggested that she take an additional half milligram of Requip at 5 PM and take 1 mg at 8 PM. I talked to Renee Carpenter about improving sleep hygiene. I asked Renee Carpenter to go to bed at a set time and wake up at a set time. We talked about potential augmentation with dopamine agonists for restless leg syndrome. Renee Carpenter gabapentin was 300 mg 3 times a day but there was some confusion as to the strength of the gabapentin. Renee Carpenter primary care is Dr. Rosanna Randy in [place]. She recently saw him on 06/13/2013 and I reviewed the records which were kindly faxed to me. She had blood work, including B12, TSH, CBC, and CMP as well as CK level. Renee Carpenter CBC was unremarkable, CMP was unremarkable, TSH was unremarkable.  Today, she reports that Renee Carpenter feet are hurting more, Renee Carpenter numbness is the same and Renee Carpenter RLS is mildly worse. She did not take the full pill of Requip at night. She still drives and has had no issues. She will be moving into a retirement community in the next 3-4 months. She takes gabapentin 600 mg in the morning around 7:30 to 9 AM, then at 4 PM and then late at night or she may wake up at 1:30 AM and take it then.   She previously followed with Dr. Morene Antu and was last seen by him on 07/16/2011, at which time he felt that Renee Carpenter symptoms were the result of a combination of PN, RLS, and lumbar radiculopathy, s/p back and neck surgeries. He increased Renee Carpenter gabapentin to 600 mg 4 times a day, advising Renee Carpenter about side effects. Falls assessment total score at the time was 16. He gave Renee Carpenter information  about fall prevention.  She has an underlying medical history of migraines, diabetes, anxiety, hypertension, chronic pain, essential tremor and peripheral neuropathy. EMG nerve conduction study was done in the past by Dr. Jannifer Franklin. B12 level, thyroid panel, ferritin and hemoglobin A1c were normal in the past. But she was diagnosed with diabetes in February 2008. She has been on gabapentin for many years. She has lower back pain. She has been using a walker. MRI neck was done in the past and she had a cervical spine operation in 1998. She has numbness in both feet, left greater than right. She has gone through physical therapy for Renee Carpenter back. She has complaints of restless leg symptoms in Renee Carpenter legs extending to Renee Carpenter thighs and even higher.  She had back surgery in 9/13 in [facility] and has been using Renee Carpenter walker outside the house and for longer distances. She was up to qid with gabapentin 600 mg, then tid, as she wanted to reduce it. She has been on Requip 1 mg at night. Renee Carpenter father had RLS, but died at 23 from an MI, Renee Carpenter sister has RLS too and also breast cancer, HTN, DM. The patient has a handicapped daughter with microcephaly, who is in a group home and she lost a son from AIDS and another child died in infancy from congenital heart d/s. She lives alone  in a 2 story home, but lives on the main level. Renee Carpenter husband died 2 years ago.  Renee Carpenter Past Medical History Is Significant For: Past Medical History  Diagnosis Date  . PONV (postoperative nausea and vomiting)   . Hypertension   . Shortness of breath   . Anemia     WAS ANEMIC  . Blood transfusion     HAD 1 SEVERAL YRS AGO...3-4 YR  . Chronic kidney disease     SOME DECREASE IN KIDNEY FUNCTION  . Arthritis   . Normal cardiac stress test     REQUESTING COPY FROM [facility]  . Normal echocardiogram     REQUESTING THAT RESULT -- DR Park Breed     Renee Carpenter Past Surgical History Is Significant For: Past Surgical History  Procedure Laterality Date  . Abdominal  hysterectomy    . Back surgery      2 CERVICAL SURGERIES ALSO  . Cataracts      RIGHT EYE CATARACT  . Lumbar laminectomy/decompression microdiscectomy  04/14/2011    Procedure: LUMBAR LAMINECTOMY/DECOMPRESSION MICRODISCECTOMY;  Surgeon: Cristi Loron;  Location: [facility];  Service: Neurosurgery;  Laterality: N/A;  Lumbar Three and Lumbar Four Laminectomy    Renee Carpenter Family History Is Significant For: Family History  Problem Relation Age of Onset  . Heart failure Father   . Hypertension Mother   . Diabetes Mother   . Cancer Sister   . Hypertension Sister   . Cancer Maternal Grandfather   . Cancer Maternal Aunt     x3    Renee Carpenter Social History Is Significant For: History   Social History  . Marital Status: Married    Spouse Name: N/A    Number of Children: N/A  . Years of Education: N/A   Social History Main Topics  . Smoking status: Never Smoker   . Smokeless tobacco: None  . Alcohol Use: No  . Drug Use: No  . Sexual Activity: No   Other Topics Concern  . None   Social History Narrative  . None    Renee Carpenter Allergies Are:  Allergies  Allergen Reactions  . Acetaminophen Itching and Other (See Comments)    Stomach upset  . Aspirin Itching and Other (See Comments)    Stomach pain  . Other Other (See Comments)    Novocaine, strange feeling  :   Renee Carpenter Current Medications Are:  Outpatient Encounter Prescriptions as of 06/28/2013  Medication Sig  . acyclovir (ZOVIRAX) 400 MG tablet Take 400 mg by mouth daily.    Marland Kitchen co-enzyme Q-10 50 MG capsule Take 200 mg by mouth daily.    Marland Kitchen esomeprazole (NEXIUM) 40 MG capsule Take 40 mg by mouth daily before breakfast.    . estrogens, conjugated, (PREMARIN) 0.625 MG tablet Take 0.625 mg by mouth daily. Take daily for 21 days then do not take for 7 days.   . fish oil-omega-3 fatty acids 1000 MG capsule Take 1 g by mouth 2 (two) times daily.    Marland Kitchen gabapentin (NEURONTIN) 600 MG tablet Take 1 tablet (600 mg total) by mouth every 8 (eight)  hours.  . hydroxypropyl methylcellulose (ISOPTO TEARS) 2.5 % ophthalmic solution Place 1 drop into both eyes 3 (three) times daily as needed. Dry eyes   . hydrOXYzine (ATARAX/VISTARIL) 10 MG tablet Take 10 mg by mouth daily as needed. For itching   . losartan (COZAAR) 50 MG tablet Take 50 mg by mouth daily.    Marland Kitchen losartan-hydrochlorothiazide (HYZAAR) 50-12.5 MG per tablet Take 1  tablet by mouth daily.  . magnesium oxide (MAG-OX) 400 MG tablet Take 400 mg by mouth daily.  . Multiple Vitamins-Minerals (MULTIVITAMINS THER. W/MINERALS) TABS Take 1 tablet by mouth daily.    Marland Kitchen OVER THE COUNTER MEDICATION Take 1 tablet by mouth daily. Calcium plus vitamin d 600/$RemoveBefor'400mg'[id number]$    . rOPINIRole (REQUIP) 1 MG tablet Take 1/2 pill at 5 PM and 1 pill at 8 PM daily.  . sertraline (ZOLOFT) 50 MG tablet   . traMADol (ULTRAM) 50 MG tablet Take 50 mg by mouth every 6 (six) hours as needed. Maximum dose= 8 tablets per day, for pain  . venlafaxine XR (EFFEXOR-XR) 75 MG 24 hr capsule   :  Review of Systems:  Out of a complete 14 point review of systems, all are reviewed and negative with the exception of these symptoms as listed below:   Review of Systems  HENT: Negative.   Eyes: Negative.   Respiratory: Negative.   Cardiovascular: Negative.   Gastrointestinal: Negative.   Endocrine: Negative.   Genitourinary: Negative.   Musculoskeletal: Positive for gait problem.  Skin: Negative.   Neurological: Positive for weakness and numbness.  Hematological: Negative.   Psychiatric/Behavioral: Positive for sleep disturbance (restless leg, snoring).    Objective:  Neurologic Exam  Physical Exam Physical Examination:   Filed Vitals:   06/28/13 1441  BP: 123/65  Pulse: 86  Temp: 97.3 F (36.3 C)    General Examination: The patient is a very pleasant 80 y.o. female in no acute distress. She appears well-developed and well-nourished and very well groomed.   HEENT: Normocephalic, atraumatic, pupils are equal, round  and reactive to light and accommodation. Extraocular tracking is good without limitation to gaze excursion or nystagmus noted. Normal smooth pursuit is noted. Funduscopic exam is normal. Hearing is grossly intact. Face is symmetric with normal facial animation and normal facial sensation. Speech is clear with no dysarthria noted. There is no hypophonia. There is no lip, neck/head, jaw or voice tremor. Neck is supple with full range of passive and active motion. There are no carotid bruits on auscultation. Oropharynx exam reveals: mild mouth dryness, adequate dental hygiene and no significant airway crowding. Mallampati is class II. Tongue protrudes centrally and palate elevates symmetrically.   Chest: Clear to auscultation without wheezing, rhonchi or crackles noted.  Heart: S1+S2+0, regular and normal without murmurs, rubs or gallops noted.   Abdomen: Soft, non-tender and non-distended with normal bowel sounds appreciated on auscultation.  Extremities: There is no pitting edema in the distal lower extremities bilaterally.   Skin: Warm and dry without trophic changes noted. There are no varicose veins, but she has spider veins.  Musculoskeletal: exam reveals no obvious joint deformities, tenderness or joint swelling or erythema.   Neurologically:  Mental status: The patient is awake, alert and oriented in all 4 spheres. Renee Carpenter memory, attention, language and knowledge are appropriate. There is no aphasia, agnosia, apraxia or anomia. Speech is clear with normal prosody and enunciation. Thought process is linear. Mood is congruent and affect is normal.  Cranial nerves are as described above under HEENT exam. In addition, shoulder shrug is normal with equal shoulder height noted. Motor exam: Normal bulk, strength and tone is noted. There is no drift, tremor or rebound. Romberg is negative. Reflexes are 2+ throughout. Toes are downgoing bilaterally. Fine motor skills are intact with normal finger taps,  normal hand movements, normal rapid alternating patting, but with mildly impaired  foot taps and normal foot agility.  Cerebellar testing  shows no dysmetria or intention tremor on finger to nose testing. There is no truncal or gait ataxia.  Sensory exam: She has decreased sensation to light touch, pinprick, vibration and temperature sense in the distal lower extremities up to the knees, worse than last time. She also has bilateral cracked callouses on Renee Carpenter feet. She has intact sensation to Renee Carpenter upper extremities.  Gait, station and balance: She stands up with mild difficulty and Renee Carpenter posture is moderately stooped with increased kyphosis in Renee Carpenter upper back. She walks with Renee Carpenter walker fairly well. She walks somewhat insecurely without the walker. She turns in 3 steps. Tandem walk is not possible. Balance is mildly impaired.  Assessment and Plan:   In summary, DAYSI BOGGAN is a very pleasant 80 year old female with a history of gait disorder, chronic lower back pain status post surgeries, and restless leg syndrome, and peripheral neuropathy, which indeed seems to have progressed by Renee Carpenter report and my exam. I am reluctant to increase Renee Carpenter gabapentin for fear of causing side effects including sedation, and balance issues. She understands this. To that end, I would like to change the timing of the gabapentin to every 6 hours and she is advised to take it at 8 AM, 2 PM and 8 PM. Furthermore, I would like to increase Renee Carpenter evening dose of Requip to 1 mg at 8 PM. She is taking half a milligram at 5 and half a milligram at 8 PM. She will increase Renee Carpenter nighttime dose. I think it is a good idea for Renee Carpenter to move into an independent retirement community as opposed to living in Renee Carpenter own home. I've also asked Renee Carpenter to see a podiatrist regarding Renee Carpenter foot calluces which are painful to Renee Carpenter. I have asked Renee Carpenter to followup with me in 4 months from now, sooner if the need arises and call us with any interim questions, concerns, problems,  updates of refill requests. She was in agreement. I did renew Renee Carpenter prescription for gabapentin Requip today.

## 2013-07-01 LAB — HM MAMMOGRAPHY

## 2013-07-04 ENCOUNTER — Ambulatory Visit: Payer: Self-pay | Admitting: Family Medicine

## 2013-11-13 ENCOUNTER — Encounter: Payer: Self-pay | Admitting: Neurology

## 2013-11-13 ENCOUNTER — Ambulatory Visit (INDEPENDENT_AMBULATORY_CARE_PROVIDER_SITE_OTHER): Payer: Medicare Other | Admitting: Neurology

## 2013-11-13 VITALS — BP 132/62 | HR 72 | Ht 65.0 in | Wt 170.0 lb

## 2013-11-13 DIAGNOSIS — G2581 Restless legs syndrome: Secondary | ICD-10-CM

## 2013-11-13 DIAGNOSIS — L84 Corns and callosities: Secondary | ICD-10-CM

## 2013-11-13 DIAGNOSIS — G609 Hereditary and idiopathic neuropathy, unspecified: Secondary | ICD-10-CM

## 2013-11-13 NOTE — Patient Instructions (Addendum)
Let's try to reduce her gabapentin in the morning to 1/2 pill which is 300 mg and keep the midday dose and evening dose at 600 mg. After 1 month, we can try to lower it further to 1/2 pill in the morning and midday and 1 pill at night.  We will keep the requip at 1 mg strength: 1/2 three times a day as you are doing now.

## 2013-11-13 NOTE — Progress Notes (Signed)
Subjective:    Patient ID: Renee Carpenter is a 80 y.o. female.  HPI    Interim history:   Renee Carpenter is an 80 year old right-handed woman with an underlying medical history of migraines, diabetes, anxiety, hypertension, chronic pain, essential tremor and peripheral neuropathy, who presents for followup consultation of her peripheral neuropathy, and history of RLS, in the context of lumbar radiculopathy. She is unaccompanied today. I last saw her on 06/28/2013, at which time she reported that her peripheral neuropathy symptoms had worsened and her exam also showed mild progression. I felt reluctant to increase her gabapentin for fear of side effects but did change the timing of the gabapentin to every 6 hours, namely at 8 AM, 2 PM and 8 PM. I did increase her evening dose of Requip to 1 mg strength at 8 PM. She was taking 0.5 mg twice daily and I kept her 5 PM dose the same for a total of 1-1/2 mg daily.  Today, she reports, that she has had more foot pain. She has seen a podiatrist and got special shoes with extra arch support and her podiatrist taped her R arch, which will stay on for 3 weeks. She takes requip 1/2 pill after noon, 1/2 at 5 PM and 1/2 pill at bedtime. She is on gabapentin 600 mg tid. She gets up around 9 PM and eats at 10 PM, and sometimes forgets to take it in the middle of the day, but then takes it in the middle of the night. She feels fatigued and "loopy". She is wondering if she can reduce her gabapentin. She is going to move into independent Living in [place].   I first met her on 10/26/12, at which time I felt that her exam was fairly stable but she did report residual back pain and restless leg symptoms. I suggested that she take an additional half milligram of Requip at 5 PM and take 1 mg at 8 PM. I talked to her about improving sleep hygiene. I asked her to go to bed at a set time and wake up at a set time. We talked about potential augmentation with dopamine  agonists for restless leg syndrome. Her gabapentin was 300 mg 3 times a day but there was some confusion as to the strength of the gabapentin.  Her primary care is Dr. Rosanna Randy in [place]. She recently saw him on 06/13/2013 and I reviewed the records which were kindly faxed to me. She had blood work, including B12, TSH, CBC, and CMP as well as CK level. Her CBC was unremarkable, CMP was unremarkable, TSH was unremarkable.  She previously followed with Dr. Morene Antu and was last seen by him on 07/16/2011, at which time he felt that her symptoms were the result of a combination of PN, RLS, and lumbar radiculopathy, s/p back and neck surgeries. He increased her gabapentin to 600 mg 4 times a day, advising her about side effects. Falls assessment total score at the time was 16. He gave her information about fall prevention.  EMG nerve conduction study was done in the past by Dr. Jannifer Franklin. B12 level, thyroid panel, ferritin and hemoglobin A1c were normal in the past. But she was diagnosed with diabetes in February 2008. She has been on gabapentin for many years. She has lower back pain. She has been using a walker. MRI neck was done in the past and she had a cervical spine operation in 1998. She has numbness in both feet, left greater than right. She  has gone through physical therapy for her back. She has complaints of restless leg symptoms in her legs extending to her thighs and even higher.  She had back surgery in 9/13 in [facility] and has been using her walker outside the house and for longer distances. She was up to qid with gabapentin 600 mg, then tid, as she wanted to reduce it. She has been on Requip 1 mg at night. Her father had RLS, but died at 41 from an MI, her sister has RLS too and also breast cancer, HTN, DM. The patient has a handicapped daughter with microcephaly, who is in a group home and she lost a son from AIDS and another child died in infancy from congenital heart d/s. Her husband died  some 2+ years ago.   Her Past Medical History Is Significant For: Past Medical History  Diagnosis Date  . PONV (postoperative nausea and vomiting)   . Hypertension   . Shortness of breath   . Anemia     WAS ANEMIC  . Blood transfusion     HAD 1 SEVERAL YRS AGO...3-4 YR  . Chronic kidney disease     SOME DECREASE IN KIDNEY FUNCTION  . Arthritis   . Normal cardiac stress test     REQUESTING COPY FROM [facility]  . Normal echocardiogram     REQUESTING THAT RESULT -- DR [name]     Her Past Surgical History Is Significant For: Past Surgical History  Procedure Laterality Date  . Abdominal hysterectomy    . Back surgery      2 CERVICAL SURGERIES ALSO  . Cataracts      RIGHT EYE CATARACT  . Lumbar laminectomy/decompression microdiscectomy  04/14/2011    Procedure: LUMBAR LAMINECTOMY/DECOMPRESSION MICRODISCECTOMY;  Surgeon: [name];  Location: [facility];  Service: Neurosurgery;  Laterality: N/A;  Lumbar Three and Lumbar Four Laminectomy    Her Family History Is Significant For: Family History  Problem Relation Age of Onset  . Heart failure Father   . Hypertension Mother   . Diabetes Mother   . Cancer Sister   . Hypertension Sister   . Cancer Maternal Grandfather   . Cancer Maternal Aunt     x3    Her Social History Is Significant For: History   Social History  . Marital Status: Married    Spouse Name: N/A    Number of Children: N/A  . Years of Education: N/A   Social History Main Topics  . Smoking status: Never Smoker   . Smokeless tobacco: None  . Alcohol Use: No  . Drug Use: No  . Sexual Activity: No   Other Topics Concern  . None   Social History Narrative  . None    Her Allergies Are:  Allergies  Allergen Reactions  . Acetaminophen Itching and Other (See Comments)    Stomach upset  . Aspirin Itching and Other (See Comments)    Stomach pain  . Other Other (See Comments)    Novocaine, strange feeling  . Ibuprofen Rash  :   Her  Current Medications Are:  Outpatient Encounter Prescriptions as of 11/13/2013  Medication Sig  . co-enzyme Q-10 50 MG capsule Take 200 mg by mouth daily.    Marland Kitchen estrogens, conjugated, (PREMARIN) 0.3 MG tablet Take 0.3 mg by mouth daily. Take daily for 21 days then do not take for 7 days.  . fish oil-omega-3 fatty acids 1000 MG capsule Take 1 g by mouth 2 (two) times daily.    Marland Kitchen  gabapentin (NEURONTIN) 600 MG tablet Take 1 tablet (600 mg total) by mouth 3 (three) times daily. Take at 8 AM, 2 PM, 8 PM  . hydroxypropyl methylcellulose (ISOPTO TEARS) 2.5 % ophthalmic solution Place 1 drop into both eyes 3 (three) times daily as needed. Dry eyes   . hydrOXYzine (ATARAX/VISTARIL) 10 MG tablet Take 10 mg by mouth daily as needed. For itching   . losartan-hydrochlorothiazide (HYZAAR) 50-12.5 MG per tablet Take 1 tablet by mouth daily.  . magnesium oxide (MAG-OX) 400 MG tablet Take 400 mg by mouth daily.  . Multiple Vitamins-Minerals (MULTIVITAMINS THER. W/MINERALS) TABS Take 1 tablet by mouth daily.    Marland Kitchen OVER THE COUNTER MEDICATION Take 1 tablet by mouth daily. Calcium plus vitamin d 600/$RemoveBefor'400mg'[id number]$    . rOPINIRole (REQUIP) 1 MG tablet Take 1/2 pill at 5 PM and 1 pill at 8 PM daily.  Marland Kitchen esomeprazole (NEXIUM) 40 MG capsule Take 40 mg by mouth daily before breakfast.    . [DISCONTINUED] acyclovir (ZOVIRAX) 400 MG tablet Take 400 mg by mouth daily.    . [DISCONTINUED] estrogens, conjugated, (PREMARIN) 0.625 MG tablet Take 0.625 mg by mouth daily. Take daily for 21 days then do not take for 7 days.   . [DISCONTINUED] losartan (COZAAR) 50 MG tablet Take 50 mg by mouth daily.    . [DISCONTINUED] sertraline (ZOLOFT) 50 MG tablet   . [DISCONTINUED] traMADol (ULTRAM) 50 MG tablet Take 50 mg by mouth every 6 (six) hours as needed. Maximum dose= 8 tablets per day, for pain  . [DISCONTINUED] venlafaxine XR (EFFEXOR-XR) 75 MG 24 hr capsule   :  Review of Systems:  Out of a complete 14 point review of systems, all are reviewed  and negative with the exception of these symptoms as listed below:   Review of Systems  Constitutional: Positive for fatigue.  HENT: Positive for hearing loss, rhinorrhea and trouble swallowing.   Eyes: Positive for photophobia.  Respiratory: Positive for shortness of breath.   Cardiovascular: Negative.   Gastrointestinal: Positive for diarrhea, blood in stool and abdominal distention.  Endocrine: Positive for cold intolerance.  Genitourinary: Positive for frequency.  Musculoskeletal: Positive for back pain, gait problem and myalgias.  Skin: Negative.   Allergic/Immunologic: Positive for environmental allergies.  Neurological: Positive for weakness and numbness.  Hematological: Bruises/bleeds easily.       Anemia  Psychiatric/Behavioral: Positive for sleep disturbance (restless leg, frequent waking, e.d.s., snoring). The patient is nervous/anxious.     Objective:  Neurologic Exam  Physical Exam Physical Examination:   Filed Vitals:   11/13/13 1440  BP: 132/62  Pulse: 72   General Examination: The patient is a very pleasant 80 y.o. female in no acute distress. She appears well-developed and well-nourished and well groomed.   HEENT: Normocephalic, atraumatic, pupils are equal, round and reactive to light and accommodation. Extraocular tracking is good without limitation to gaze excursion or nystagmus noted. Normal smooth pursuit is noted. Funduscopic exam is normal. Hearing is grossly intact. Face is symmetric with normal facial animation and normal facial sensation. Speech is clear with no dysarthria noted. There is no hypophonia. There is no lip, neck/head, jaw or voice tremor. Neck is supple with full range of passive and active motion. There are no carotid bruits on auscultation. Oropharynx exam reveals: mild mouth dryness, adequate dental hygiene and no significant airway crowding. Mallampati is class II. Tongue protrudes centrally and palate elevates symmetrically.   Chest:  Clear to auscultation without wheezing, rhonchi or crackles noted.  Heart: S1+S2+0, regular and normal without murmurs, rubs or gallops noted.   Abdomen: Soft, non-tender and non-distended with normal bowel sounds appreciated on auscultation.  Extremities: There is no pitting edema in the distal lower extremities bilaterally.   Skin: Warm and dry without trophic changes noted. There are no varicose veins, but she has spider veins.  Musculoskeletal: exam reveals no obvious joint deformities, tenderness or joint swelling or erythema.   Neurologically:  Mental status: The patient is awake, alert and oriented in all 4 spheres. Her memory, attention, language and knowledge are appropriate. There is no aphasia, agnosia, apraxia or anomia. Speech is clear with normal prosody and enunciation. Thought process is linear. Mood is congruent and affect is normal.  Cranial nerves are as described above under HEENT exam. In addition, shoulder shrug is normal with equal shoulder height noted. Motor exam: Normal bulk, strength and tone is noted. There is no drift, tremor or rebound. Romberg is negative. Reflexes are 2+ throughout with absent ankle jerks. Toes are downgoing bilaterally. Fine motor skills are intact with normal finger taps, normal hand movements, normal rapid alternating patting, but with mildly impaired  foot taps and normal foot agility.  Cerebellar testing shows no dysmetria or intention tremor on finger to nose testing. There is no truncal or gait ataxia.  Sensory exam: She has decreased sensation to light touch, pinprick, vibration and temperature sense in the distal lower extremities up to the knees, stable from last time. She also has bilateral cracked callouses on her feet. She has intact sensation to her upper extremities.  Gait, station and balance: She stands up with mild difficulty and her posture is moderately stooped with increased kyphosis in her upper back. She walks with her walker  fairly well. She walks somewhat insecurely without the walker. She turns in 3 steps. Tandem walk is not possible. Balance is mildly impaired.  Assessment and Plan:   In summary, Renee Carpenter is a very pleasant 80 year old female with a history of gait disorder, chronic lower back pain status post surgeries, and restless leg syndrome, and peripheral neuropathy. She is c/o feeling lightheaded and somewhat sedated, which may be a medication side effect. She is advised to reduce her gabapentin in the morning to 1/2 pill which is 300 mg and keep the midday dose and evening dose at 600 mg. After 1 month, we can try to lower it further to 1/2 pill in the morning and midday and 1 pill at night. We will keep the requip at 1 mg strength: 1/2 tid, as she is doing now. She is moving into an independent retirement community as opposed to living in her own home. She will continue to see a podiatrist regarding her foot calluces which are painful to her. I have asked her to followup with me in 4 months from now, sooner if the need arises and call us with any interim questions, concerns, problems, updates of refill requests. She was in agreement.

## 2013-12-23 ENCOUNTER — Other Ambulatory Visit: Payer: Self-pay | Admitting: Neurology

## 2014-01-29 ENCOUNTER — Ambulatory Visit: Payer: Self-pay | Admitting: Oncology

## 2014-01-29 LAB — CBC CANCER CENTER
BASOS ABS: 0.1 x10 3/mm (ref 0.0–0.1)
Basophil %: 1.1 %
EOS ABS: 0.1 x10 3/mm (ref 0.0–0.7)
EOS PCT: 1.2 %
HCT: 32.2 % — ABNORMAL LOW (ref 35.0–47.0)
HGB: 10.7 g/dL — AB (ref 12.0–16.0)
LYMPHS ABS: 4 x10 3/mm — AB (ref 1.0–3.6)
LYMPHS PCT: 52.3 %
MCH: 38.4 pg — AB (ref 26.0–34.0)
MCHC: 33.2 g/dL (ref 32.0–36.0)
MCV: 116 fL — AB (ref 80–100)
MONO ABS: 1 x10 3/mm — AB (ref 0.2–0.9)
MONOS PCT: 12.3 %
NEUTROS ABS: 2.6 x10 3/mm (ref 1.4–6.5)
NEUTROS PCT: 33.1 %
Platelet: 246 x10 3/mm (ref 150–440)
RBC: 2.79 10*6/uL — ABNORMAL LOW (ref 3.80–5.20)
RDW: 17 % — AB (ref 11.5–14.5)
WBC: 7.7 x10 3/mm (ref 3.6–11.0)

## 2014-01-29 LAB — FERRITIN: Ferritin (ARMC): 319 ng/mL (ref 8–388)

## 2014-01-29 LAB — IRON AND TIBC
IRON BIND. CAP.(TOTAL): 312 ug/dL (ref 250–450)
Iron Saturation: 32 %
Iron: 101 ug/dL (ref 50–170)
Unbound Iron-Bind.Cap.: 211 ug/dL

## 2014-01-29 LAB — FOLATE: Folic Acid: 95.5 ng/mL (ref 3.1–100.0)

## 2014-02-05 ENCOUNTER — Ambulatory Visit: Payer: Self-pay | Admitting: Oncology

## 2014-02-06 ENCOUNTER — Emergency Department: Payer: Self-pay | Admitting: Emergency Medicine

## 2014-02-06 LAB — URINALYSIS, COMPLETE
BACTERIA: NONE SEEN
BILIRUBIN, UR: NEGATIVE
Glucose,UR: NEGATIVE mg/dL (ref 0–75)
KETONE: NEGATIVE
NITRITE: NEGATIVE
PH: 5 (ref 4.5–8.0)
PROTEIN: NEGATIVE
RBC,UR: 18 /HPF (ref 0–5)
Specific Gravity: 1.02 (ref 1.003–1.030)
Squamous Epithelial: 3
WBC UR: 44 /HPF (ref 0–5)

## 2014-02-06 LAB — COMPREHENSIVE METABOLIC PANEL
ALK PHOS: 76 U/L
AST: 31 U/L (ref 15–37)
Albumin: 3.9 g/dL (ref 3.4–5.0)
Anion Gap: 6 — ABNORMAL LOW (ref 7–16)
BILIRUBIN TOTAL: 0.5 mg/dL (ref 0.2–1.0)
BUN: 20 mg/dL — ABNORMAL HIGH (ref 7–18)
CO2: 30 mmol/L (ref 21–32)
Calcium, Total: 9.8 mg/dL (ref 8.5–10.1)
Chloride: 103 mmol/L (ref 98–107)
Creatinine: 1.12 mg/dL (ref 0.60–1.30)
EGFR (African American): 52 — ABNORMAL LOW
GFR CALC NON AF AMER: 44 — AB
GLUCOSE: 123 mg/dL — AB (ref 65–99)
OSMOLALITY: 282 (ref 275–301)
POTASSIUM: 3.9 mmol/L (ref 3.5–5.1)
SGPT (ALT): 31 U/L
SODIUM: 139 mmol/L (ref 136–145)
TOTAL PROTEIN: 7.5 g/dL (ref 6.4–8.2)

## 2014-02-06 LAB — CBC
HCT: 32.8 % — ABNORMAL LOW (ref 35.0–47.0)
HGB: 11.1 g/dL — ABNORMAL LOW (ref 12.0–16.0)
MCH: 39.2 pg — ABNORMAL HIGH (ref 26.0–34.0)
MCHC: 33.8 g/dL (ref 32.0–36.0)
MCV: 116 fL — ABNORMAL HIGH (ref 80–100)
PLATELETS: 266 10*3/uL (ref 150–440)
RBC: 2.83 10*6/uL — ABNORMAL LOW (ref 3.80–5.20)
RDW: 17.6 % — AB (ref 11.5–14.5)
WBC: 10.4 10*3/uL (ref 3.6–11.0)

## 2014-04-12 ENCOUNTER — Telehealth: Payer: Self-pay | Admitting: *Deleted

## 2014-04-12 NOTE — Telephone Encounter (Signed)
lft VM regarding appt date and time change due to Dr. Teofilo PodAthar's schedule changes.

## 2014-04-16 ENCOUNTER — Ambulatory Visit: Payer: Medicare Other | Admitting: Neurology

## 2014-04-24 ENCOUNTER — Ambulatory Visit: Payer: Medicare Other | Admitting: Neurology

## 2014-06-19 ENCOUNTER — Other Ambulatory Visit: Payer: Self-pay | Admitting: Neurology

## 2014-08-29 LAB — HEPATIC FUNCTION PANEL
ALT: 23 U/L (ref 7–35)
AST: 27 U/L (ref 13–35)
Alkaline Phosphatase: 70 U/L (ref 25–125)
BILIRUBIN, TOTAL: 0.5 mg/dL

## 2014-08-29 LAB — BASIC METABOLIC PANEL
BUN: 21 mg/dL (ref 4–21)
CREATININE: 1.5 mg/dL — AB (ref 0.5–1.1)
Glucose: 150 mg/dL
POTASSIUM: 4.7 mmol/L (ref 3.4–5.3)
Sodium: 142 mmol/L (ref 137–147)

## 2014-08-29 LAB — TSH: TSH: 1.94 u[IU]/mL (ref 0.41–5.90)

## 2014-08-30 ENCOUNTER — Ambulatory Visit: Payer: Self-pay | Admitting: Family Medicine

## 2014-09-10 DIAGNOSIS — I447 Left bundle-branch block, unspecified: Secondary | ICD-10-CM | POA: Insufficient documentation

## 2014-09-17 LAB — CBC AND DIFFERENTIAL
HCT: 29 % — AB (ref 36–46)
Hemoglobin: 9.8 g/dL — AB (ref 12.0–16.0)
Neutrophils Absolute: 4 /uL
PLATELETS: 285 10*3/uL (ref 150–399)
WBC: 9.4 10^3/mL

## 2014-09-17 LAB — HEMOGLOBIN A1C: HEMOGLOBIN A1C: 7.5 % — AB (ref 4.0–6.0)

## 2014-11-12 ENCOUNTER — Other Ambulatory Visit: Payer: Self-pay | Admitting: Family Medicine

## 2014-12-14 DIAGNOSIS — M549 Dorsalgia, unspecified: Secondary | ICD-10-CM | POA: Insufficient documentation

## 2014-12-14 DIAGNOSIS — J449 Chronic obstructive pulmonary disease, unspecified: Secondary | ICD-10-CM | POA: Insufficient documentation

## 2014-12-14 DIAGNOSIS — K5792 Diverticulitis of intestine, part unspecified, without perforation or abscess without bleeding: Secondary | ICD-10-CM | POA: Insufficient documentation

## 2014-12-14 DIAGNOSIS — F411 Generalized anxiety disorder: Secondary | ICD-10-CM | POA: Insufficient documentation

## 2014-12-14 DIAGNOSIS — M5430 Sciatica, unspecified side: Secondary | ICD-10-CM | POA: Insufficient documentation

## 2014-12-14 DIAGNOSIS — G4700 Insomnia, unspecified: Secondary | ICD-10-CM | POA: Insufficient documentation

## 2014-12-14 DIAGNOSIS — E119 Type 2 diabetes mellitus without complications: Secondary | ICD-10-CM | POA: Insufficient documentation

## 2014-12-14 DIAGNOSIS — D649 Anemia, unspecified: Secondary | ICD-10-CM | POA: Insufficient documentation

## 2014-12-14 DIAGNOSIS — K589 Irritable bowel syndrome without diarrhea: Secondary | ICD-10-CM | POA: Insufficient documentation

## 2014-12-14 DIAGNOSIS — E348 Other specified endocrine disorders: Secondary | ICD-10-CM | POA: Insufficient documentation

## 2014-12-14 DIAGNOSIS — G2581 Restless legs syndrome: Secondary | ICD-10-CM | POA: Insufficient documentation

## 2014-12-14 DIAGNOSIS — M1990 Unspecified osteoarthritis, unspecified site: Secondary | ICD-10-CM | POA: Insufficient documentation

## 2014-12-14 DIAGNOSIS — G8929 Other chronic pain: Secondary | ICD-10-CM | POA: Insufficient documentation

## 2014-12-14 DIAGNOSIS — M810 Age-related osteoporosis without current pathological fracture: Secondary | ICD-10-CM | POA: Insufficient documentation

## 2014-12-14 DIAGNOSIS — F329 Major depressive disorder, single episode, unspecified: Secondary | ICD-10-CM | POA: Insufficient documentation

## 2014-12-14 DIAGNOSIS — K219 Gastro-esophageal reflux disease without esophagitis: Secondary | ICD-10-CM | POA: Insufficient documentation

## 2014-12-14 DIAGNOSIS — I10 Essential (primary) hypertension: Secondary | ICD-10-CM | POA: Insufficient documentation

## 2014-12-20 ENCOUNTER — Telehealth: Payer: Self-pay | Admitting: Family Medicine

## 2014-12-20 DIAGNOSIS — M5430 Sciatica, unspecified side: Secondary | ICD-10-CM

## 2014-12-20 NOTE — Telephone Encounter (Signed)
Has had neck surgery in the past and now it is bothering her again.  Would like to talk to someone about what to do.  Please call her at [phone].

## 2014-12-23 NOTE — Telephone Encounter (Signed)
Spoke with patient and she is having trouble with her neck again that she had surgery on and last time had xrays done was told there was a crack near the rods that were placed she thinks-she has been having pains in her neck shooting up to her head, she also states she is having sciatica pain severe off and on in the back of her legs-she had surgery for this in the past also. [facility] and had surgery in relation to this in 2013 and she wanted to see if she should go back to see him or she needs to see us first. Please review. Thank you-aa

## 2014-12-23 NOTE — Telephone Encounter (Signed)
If She feels strongly that it is related to old surgery I will refer back to Dr. Wyline Moodbranch.

## 2014-12-23 NOTE — Telephone Encounter (Signed)
Pt advised -she does feel like this coming from issues she had with before surgeries and related chronic pains. Referral order put in-aa

## 2015-01-01 ENCOUNTER — Ambulatory Visit: Payer: Self-pay | Admitting: Family Medicine

## 2015-01-01 ENCOUNTER — Encounter: Payer: Self-pay | Admitting: Family Medicine

## 2015-01-01 ENCOUNTER — Ambulatory Visit (INDEPENDENT_AMBULATORY_CARE_PROVIDER_SITE_OTHER): Payer: Medicare Other | Admitting: Family Medicine

## 2015-01-01 VITALS — BP 128/60 | HR 90 | Temp 97.6°F | Resp 16 | Wt 172.0 lb

## 2015-01-01 DIAGNOSIS — I10 Essential (primary) hypertension: Secondary | ICD-10-CM | POA: Diagnosis not present

## 2015-01-01 DIAGNOSIS — N958 Other specified menopausal and perimenopausal disorders: Secondary | ICD-10-CM

## 2015-01-01 DIAGNOSIS — Z90710 Acquired absence of both cervix and uterus: Secondary | ICD-10-CM

## 2015-01-01 DIAGNOSIS — M542 Cervicalgia: Secondary | ICD-10-CM | POA: Diagnosis not present

## 2015-01-01 DIAGNOSIS — F329 Major depressive disorder, single episode, unspecified: Secondary | ICD-10-CM | POA: Insufficient documentation

## 2015-01-01 DIAGNOSIS — F32A Depression, unspecified: Secondary | ICD-10-CM | POA: Insufficient documentation

## 2015-01-01 DIAGNOSIS — E8940 Asymptomatic postprocedural ovarian failure: Secondary | ICD-10-CM

## 2015-01-01 MED ORDER — ESTROGENS CONJUGATED 0.3 MG PO TABS: 0.3000 mg | ORAL_TABLET | Freq: Every day | ORAL | Status: DC

## 2015-01-01 NOTE — Progress Notes (Signed)
Patient ID: Renee Carpenter, female   DOB: [birth year], 81 y.o.   MRN: [id number]    Subjective:  HPI Pt reports that she has been having neck pain for about 2 months or more. She reports that it just felt like one day it felt like it grabbed. She had surgery on her neck several years ago and she was told not too long ago that there was a crack where they had repaired it and thinks it may be coming from that. She was going to see if she could get an appt with her surgeon but she wants to see what Dr. Wonda Olds option is on this. She reports that she hurt up her neck and into her and has had some headaches due to this. She also reports that she is having some hand joint pain, that is sharpe and started suddenly.  Pt also wanted to know about Premarin and if she could go back on it. She felt better on the premarin.   Prior to Admission medications   Medication Sig Start Date End Date Taking? Authorizing Provider  aspirin EC 81 MG tablet Take by mouth.   Yes Historical Provider, MD  Calcium-Magnesium-Vitamin D (CALCIUM 500 PO) Take by mouth. 08/09/11  Yes Historical Provider, MD  CHOLESTYRAMINE LIGHT PO Take by mouth. 02/13/14  Yes Historical Provider, MD  clidinium-chlordiazePOXIDE (LIBRAX) 5-2.5 MG per capsule Take by mouth. 02/26/14  Yes Historical Provider, MD  Coenzyme Q10 (CO Q10) 200 MG CAPS Take by mouth. 08/09/11  Yes Historical Provider, MD  dicyclomine (BENTYL) 20 MG tablet Take by mouth. 12/18/13  Yes Historical Provider, MD  esomeprazole (NEXIUM) 40 MG capsule Take by mouth. 10/30/13  Yes Historical Provider, MD  estrogens, conjugated, (PREMARIN) 0.45 MG tablet Take by mouth. 02/19/14  Yes Historical Provider, MD  fish oil-omega-3 fatty acids 1000 MG capsule Take 1 g by mouth 2 (two) times daily.     Yes Historical Provider, MD  gabapentin (NEURONTIN) 600 MG tablet Take by mouth 3 (three) times daily.  03/21/14  Yes Historical Provider, MD  hydroxypropyl methylcellulose (ISOPTO TEARS) 2.5 %  ophthalmic solution Place 1 drop into both eyes 3 (three) times daily as needed. Dry eyes    Yes Historical Provider, MD  hydrOXYzine (ATARAX/VISTARIL) 10 MG tablet Take by mouth. 02/13/13  Yes Historical Provider, MD  losartan-hydrochlorothiazide (HYZAAR) 50-12.5 MG per tablet Take by mouth. 08/19/14  Yes Historical Provider, MD  magnesium oxide (MAG-OX) 400 MG tablet Take 400 mg by mouth daily.   Yes Historical Provider, MD  magnesium oxide (MAG-OX) 400 MG tablet Take by mouth. 07/08/14  Yes Historical Provider, MD  MULTIPLE VITAMIN PO Take by mouth. 08/09/11  Yes Historical Provider, MD  Multiple Vitamins-Minerals (MULTIVITAMINS THER. W/MINERALS) TABS Take 1 tablet by mouth daily.     Yes Historical Provider, MD  OVER THE COUNTER MEDICATION Take 1 tablet by mouth daily. Calcium plus vitamin d 600/400mg     Yes Historical Provider, MD  Psyllium 48.57 % POWD Take by mouth.   Yes Historical Provider, MD  rOPINIRole (REQUIP) 1 MG tablet Take by mouth. 10/10/12  Yes Historical Provider, MD  urea (CARMOL) 40 % CREA APP EXT AA BID 10/16/14  Yes Historical Provider, MD    Patient Active Problem List   Diagnosis Date Noted  . Back pain, chronic 12/14/2014  . COPD, mild 12/14/2014  . Anxiety, generalized 12/14/2014  . Esophageal reflux 12/14/2014  . Borderline diabetes 12/14/2014  . Progeria syndrome 12/14/2014  . BP (high  blood pressure) 12/14/2014  . Adaptive colitis 12/14/2014  . Cannot sleep 12/14/2014  . Affective disorder, major 12/14/2014  . Chronic anemia 12/14/2014  . Arthritis, degenerative 12/14/2014  . OP (osteoporosis) 12/14/2014  . Restless leg 12/14/2014  . Neuralgia neuritis, sciatic nerve 12/14/2014  . NEUROPATHY 02/26/2008  . HYPERTENSION 02/26/2008  . FATIQUE AND MALAISE 02/26/2008  . DYSPNEA 02/26/2008    Past Medical History  Diagnosis Date  . PONV (postoperative nausea and vomiting)   . Hypertension   . Shortness of breath   . Anemia     WAS ANEMIC  . Blood transfusion      HAD 1 SEVERAL YRS AGO...3-4 YR  . Chronic kidney disease     SOME DECREASE IN KIDNEY FUNCTION  . Arthritis   . Normal cardiac stress test     REQUESTING COPY FROM [facility]  . Normal echocardiogram     REQUESTING THAT RESULT -- DR Park Breed   . Spondylolysis 1998    History   Social History  . Marital Status: Married    Spouse Name: N/A  . Number of Children: N/A  . Years of Education: N/A   Occupational History  . Not on file.   Social History Main Topics  . Smoking status: Never Smoker   . Smokeless tobacco: Not on file  . Alcohol Use: No  . Drug Use: No  . Sexual Activity: No   Other Topics Concern  . Not on file   Social History Narrative    Allergies  Allergen Reactions  . Acetaminophen Itching and Other (See Comments)    Stomach upset  . Aspirin Itching and Other (See Comments)    Stomach pain  . Other Other (See Comments)    Novocaine, strange feeling  . Sulfa Antibiotics Other (See Comments)  . Ibuprofen Rash    Review of Systems  Constitutional: Negative.   Eyes: Negative.   Cardiovascular: Negative.   Gastrointestinal: Negative.   Genitourinary: Negative.   Musculoskeletal: Positive for joint pain and neck pain.  Skin: Negative.   Neurological: Positive for headaches.  Endo/Heme/Allergies: Negative.   Psychiatric/Behavioral: Negative.      There is no immunization history on file for this patient. Objective:  BP 128/60 mmHg  Pulse 90  Temp(Src) 97.6 F (36.4 C) (Oral)  Resp 16  Wt 172 lb (78.019 kg)  Physical Exam  Constitutional: She is oriented to person, place, and time and well-developed, well-nourished, and in no distress.  HENT:  Head: Normocephalic.  Right Ear: External ear normal.  Nose: Nose normal.  Eyes: Conjunctivae and EOM are normal. Pupils are equal, round, and reactive to light.  Neck: Neck supple.  Cardiovascular: Normal rate, regular rhythm, normal heart sounds and intact distal pulses.   Pulmonary/Chest:  Effort normal and breath sounds normal.  Musculoskeletal: Normal range of motion.  Significant increased thoracic kyphosis which is chronic per the patient.  Neurological: She is alert and oriented to person, place, and time. She has normal reflexes. Gait normal. GCS score is 15.  Skin: Skin is warm and dry.  Psychiatric: Mood, memory, affect and judgment normal.    Lab Results  Component Value Date   WBC 9.4 09/17/2014   HGB 9.8* 09/17/2014   HCT 29* 09/17/2014   PLT 285 09/17/2014   GLUCOSE 123* 02/06/2014   TSH 1.94 08/29/2014   HGBA1C 7.5* 09/17/2014    CMP     Component Value Date/Time   NA 142 08/29/2014   NA 139 02/06/2014 1958  NA 139 04/07/2011 1306   K 4.7 08/29/2014   K 3.9 02/06/2014 1958   CL 103 02/06/2014 1958   CL 103 04/07/2011 1306   CO2 30 02/06/2014 1958   CO2 31 04/07/2011 1306   GLUCOSE 123* 02/06/2014 1958   GLUCOSE 144* 04/07/2011 1306   BUN 21 08/29/2014   BUN 20* 02/06/2014 1958   BUN 16 04/07/2011 1306   CREATININE 1.5* 08/29/2014   CREATININE 1.12 02/06/2014 1958   CREATININE 0.83 04/07/2011 1306   CALCIUM 9.8 02/06/2014 1958   CALCIUM 9.3 04/07/2011 1306   PROT 7.5 02/06/2014 1958   ALBUMIN 3.9 02/06/2014 1958   AST 27 08/29/2014   AST 31 02/06/2014 1958   ALT 23 08/29/2014   ALT 31 02/06/2014 1958   ALKPHOS 70 08/29/2014   ALKPHOS 76 02/06/2014 1958   BILITOT 0.5 02/06/2014 1958   GFRNONAA 44* 02/06/2014 1958   GFRNONAA 63* 04/07/2011 1306   GFRAA 52* 02/06/2014 1958   GFRAA 74* 04/07/2011 1306    Assessment and Plan :  1. Neck pain This is a chronic problem which seems to be getting worse. She thinks something has happened to her surgical repair.  - Ambulatory referral to Neurosurgery  2. Post hysterectomy menopause  - estrogens, conjugated, (PREMARIN) 0.3 MG tablet; Take 1 tablet (0.3 mg total) by mouth daily. Take daily for 21 days then do not take for 7 days.  Dispense: 30 tablet; Refill: 12  3. Essential  hypertension  4. Chronic anxiety Patient was seen and examined by Dr. Julieanne Manson, and noted scribed by Dimas Chyle, CMA   Julieanne Manson MD [facility] 01/01/2015 10:38 AM

## 2015-01-14 ENCOUNTER — Ambulatory Visit (INDEPENDENT_AMBULATORY_CARE_PROVIDER_SITE_OTHER): Payer: Medicare Other | Admitting: Family Medicine

## 2015-01-14 ENCOUNTER — Encounter: Payer: Self-pay | Admitting: Family Medicine

## 2015-01-14 VITALS — BP 110/60 | HR 78 | Temp 98.0°F | Resp 16 | Wt 170.0 lb

## 2015-01-14 DIAGNOSIS — R7309 Other abnormal glucose: Secondary | ICD-10-CM

## 2015-01-14 DIAGNOSIS — I10 Essential (primary) hypertension: Secondary | ICD-10-CM | POA: Diagnosis not present

## 2015-01-14 DIAGNOSIS — E1140 Type 2 diabetes mellitus with diabetic neuropathy, unspecified: Secondary | ICD-10-CM

## 2015-01-14 DIAGNOSIS — E781 Pure hyperglyceridemia: Secondary | ICD-10-CM | POA: Diagnosis not present

## 2015-01-14 DIAGNOSIS — R7303 Prediabetes: Secondary | ICD-10-CM

## 2015-01-14 DIAGNOSIS — R5383 Other fatigue: Secondary | ICD-10-CM

## 2015-01-14 DIAGNOSIS — R300 Dysuria: Secondary | ICD-10-CM

## 2015-01-14 LAB — POCT URINALYSIS DIPSTICK
Bilirubin, UA: NEGATIVE
GLUCOSE UA: NEGATIVE
KETONES UA: NEGATIVE
LEUKOCYTES UA: NEGATIVE
NITRITE UA: NEGATIVE
PROTEIN UA: NEGATIVE
Spec Grav, UA: 1.015
Urobilinogen, UA: 0.2
pH, UA: 6

## 2015-01-14 LAB — POCT GLYCOSYLATED HEMOGLOBIN (HGB A1C): HEMOGLOBIN A1C: 7.9

## 2015-01-14 NOTE — Progress Notes (Signed)
Patient ID: DAVID TOWSON, female   DOB: [birth year], 81 y.o.   MRN: [id number]    Subjective:  HPI  Diabetes Mellitus Type II, Follow-up:   Lab Results  Component Value Date   HGBA1C 7.5* 09/17/2014    Last seen for diabetes 4 months ago.  Management changes included none. She reports good compliance with treatment. She is not having side effects.  Current symptoms include none Home blood sugar records: not being checked  Episodes of hypoglycemia? no   Current Insulin Regimen: n/a Most Recent Eye Exam: 4-5 months ago Weight trend: stable Current exercise: none formally.   Pertinent Labs:    Component Value Date/Time   CREATININE 1.5* 08/29/2014   CREATININE 1.12 02/06/2014 1958   CREATININE 0.83 04/07/2011 1306    Wt Readings from Last 3 Encounters:  01/14/15 170 lb (77.111 kg)  01/01/15 172 lb (78.019 kg)  12/14/14 168 lb (76.204 kg)    ------------------------------------------------------------------------  Pt reports that she gets really tired and sleepy after she eats meals. She is concerned with this and wanted ask Dr. Sullivan Lone about it today.  She also reports that she is is having dysuria, frequency, retention and incontinence.   Prior to Admission medications   Medication Sig Start Date End Date Taking? Authorizing Provider  aspirin EC 81 MG tablet Take by mouth.   Yes Historical Provider, MD  Calcium-Magnesium-Vitamin D (CALCIUM 500 PO) Take by mouth. 08/09/11  Yes Historical Provider, MD  CHOLESTYRAMINE LIGHT PO Take by mouth. 02/13/14  Yes Historical Provider, MD  clidinium-chlordiazePOXIDE (LIBRAX) 5-2.5 MG per capsule Take by mouth. 02/26/14  Yes Historical Provider, MD  Coenzyme Q10 (CO Q10) 200 MG CAPS Take by mouth. 08/09/11  Yes Historical Provider, MD  dicyclomine (BENTYL) 20 MG tablet Take by mouth. 12/18/13  Yes Historical Provider, MD  esomeprazole (NEXIUM) 40 MG capsule Take by mouth. 10/30/13  Yes Historical Provider, MD  estrogens, conjugated,  (PREMARIN) 0.3 MG tablet Take 1 tablet (0.3 mg total) by mouth daily. Take daily for 21 days then do not take for 7 days. 01/01/15  Yes Richard Hulen Shouts., MD  fish oil-omega-3 fatty acids 1000 MG capsule Take 1 g by mouth 2 (two) times daily.     Yes Historical Provider, MD  gabapentin (NEURONTIN) 600 MG tablet Take by mouth 3 (three) times daily.  03/21/14  Yes Historical Provider, MD  hydroxypropyl methylcellulose (ISOPTO TEARS) 2.5 % ophthalmic solution Place 1 drop into both eyes 3 (three) times daily as needed. Dry eyes    Yes Historical Provider, MD  hydrOXYzine (ATARAX/VISTARIL) 10 MG tablet Take by mouth. 02/13/13  Yes Historical Provider, MD  losartan-hydrochlorothiazide (HYZAAR) 50-12.5 MG per tablet Take by mouth. 08/19/14  Yes Historical Provider, MD  magnesium oxide (MAG-OX) 400 MG tablet Take 400 mg by mouth daily.   Yes Historical Provider, MD  magnesium oxide (MAG-OX) 400 MG tablet Take by mouth. 07/08/14  Yes Historical Provider, MD  MULTIPLE VITAMIN PO Take by mouth. 08/09/11  Yes Historical Provider, MD  Multiple Vitamins-Minerals (MULTIVITAMINS THER. W/MINERALS) TABS Take 1 tablet by mouth daily.     Yes Historical Provider, MD  OVER THE COUNTER MEDICATION Take 1 tablet by mouth daily. Calcium plus vitamin d 600/400mg     Yes Historical Provider, MD  Psyllium 48.57 % POWD Take by mouth.   Yes Historical Provider, MD  rOPINIRole (REQUIP) 1 MG tablet Take by mouth. 10/10/12  Yes Historical Provider, MD  urea (CARMOL) 40 % CREA APP EXT AA  BID 10/16/14  Yes Historical Provider, MD    Patient Active Problem List   Diagnosis Date Noted  . Clinical depression 01/01/2015  . Back pain, chronic 12/14/2014  . COPD, mild 12/14/2014  . Anxiety, generalized 12/14/2014  . Esophageal reflux 12/14/2014  . Borderline diabetes 12/14/2014  . Progeria syndrome 12/14/2014  . BP (high blood pressure) 12/14/2014  . Adaptive colitis 12/14/2014  . Cannot sleep 12/14/2014  . Affective disorder, major  12/14/2014  . Chronic anemia 12/14/2014  . Arthritis, degenerative 12/14/2014  . OP (osteoporosis) 12/14/2014  . Restless leg 12/14/2014  . Neuralgia neuritis, sciatic nerve 12/14/2014  . 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 NEUROPATHY 02/26/2008  . Essential hypertension 02/26/2008  . Fatigue 02/26/2008  . DYSPNEA 02/26/2008    Past Medical History  Diagnosis Date  . PONV (postoperative nausea and vomiting)   . Hypertension   . Shortness of breath   . Anemia     WAS ANEMIC  . Blood transfusion     HAD 1 SEVERAL YRS AGO...3-4 YR  . Chronic kidney disease     SOME DECREASE IN KIDNEY FUNCTION  . Arthritis   . Normal cardiac stress test     REQUESTING COPY FROM [facility]  . Normal echocardiogram     REQUESTING THAT RESULT -- DR Park Breed   . Spondylolysis 1998    History   Social History  . Marital Status: Married    Spouse Name: N/A  . Number of Children: N/A  . Years of Education: N/A   Occupational History  . Not on file.   Social History Main Topics  . Smoking status: Never Smoker   . Smokeless tobacco: Not on file  . Alcohol Use: No  . Drug Use: No  . Sexual Activity: No   Other Topics Concern  . Not on file   Social History Narrative    Allergies  Allergen Reactions  . Acetaminophen Itching and Other (See Comments)    Stomach upset  . Aspirin Itching and Other (See Comments)    Stomach pain  . Other Other (See Comments)    Novocaine, strange feeling  . Sulfa Antibiotics Other (See Comments)  . Ibuprofen Rash    Review of Systems  Constitutional: Positive for malaise/fatigue.  HENT: Negative.   Eyes: Negative.   Respiratory: Negative.   Cardiovascular: Negative.   Gastrointestinal: Negative.   Genitourinary: Negative.   Musculoskeletal: Negative.   Skin: Negative.   Neurological: Negative.   Endo/Heme/Allergies: Negative.   Psychiatric/Behavioral: Negative.      There is no immunization history on  file for this patient. Objective:  BP 110/60 mmHg  Pulse 78  Temp(Src) 98 F (36.7 C) (Oral)  Resp 16  Wt 170 lb (77.111 kg)  Physical Exam  Constitutional: She is oriented to person, place, and time.  HENT:  Head: Normocephalic and atraumatic.  Right Ear: External ear normal.  Left Ear: External ear normal.  Nose: Nose normal.  Eyes: Conjunctivae are normal.  Neck: Neck supple.  Cardiovascular: Normal rate, regular rhythm and normal heart sounds.   Abdominal: Soft.  Musculoskeletal:  Increased thoracic kyphosis.  Neurological: She is alert and oriented to person, place, and time.  Skin: Skin is warm and dry.  Psychiatric: Mood, memory, affect and judgment normal.    Lab Results  Component Value Date   WBC 9.4 09/17/2014   HGB 9.8* 09/17/2014   HCT 29* 09/17/2014   PLT 285 09/17/2014   GLUCOSE 123* 02/06/2014  TSH 1.94 08/29/2014   HGBA1C 7.5* 09/17/2014    CMP     Component Value Date/Time   NA 142 08/29/2014   NA 139 02/06/2014 1958   NA 139 04/07/2011 1306   K 4.7 08/29/2014   K 3.9 02/06/2014 1958   CL 103 02/06/2014 1958   CL 103 04/07/2011 1306   CO2 30 02/06/2014 1958   CO2 31 04/07/2011 1306   GLUCOSE 123* 02/06/2014 1958   GLUCOSE 144* 04/07/2011 1306   BUN 21 08/29/2014   BUN 20* 02/06/2014 1958   BUN 16 04/07/2011 1306   CREATININE 1.5* 08/29/2014   CREATININE 1.12 02/06/2014 1958   CREATININE 0.83 04/07/2011 1306   CALCIUM 9.8 02/06/2014 1958   CALCIUM 9.3 04/07/2011 1306   PROT 7.5 02/06/2014 1958   ALBUMIN 3.9 02/06/2014 1958   AST 27 08/29/2014   AST 31 02/06/2014 1958   ALT 23 08/29/2014   ALT 31 02/06/2014 1958   ALKPHOS 70 08/29/2014   ALKPHOS 76 02/06/2014 1958   BILITOT 0.5 02/06/2014 1958   GFRNONAA 44* 02/06/2014 1958   GFRNONAA 63* 04/07/2011 1306   GFRAA 52* 02/06/2014 1958   GFRAA 74* 04/07/2011 1306    Assessment and Plan :  TIDM A1C is 7.9 today. No changes today. Chronic Abdominal Pain/IBS Chronic Back  Pain HTN Stable. Julieanne Manson MD [facility] 01/14/2015 2:03 PM

## 2015-01-16 LAB — CBC WITH DIFFERENTIAL/PLATELET
BASOS: 1 %
Basophils Absolute: 0.1 10*3/uL (ref 0.0–0.2)
EOS (ABSOLUTE): 0.2 10*3/uL (ref 0.0–0.4)
EOS: 2 %
HEMOGLOBIN: 10.2 g/dL — AB (ref 11.1–15.9)
Hematocrit: 28.8 % — ABNORMAL LOW (ref 34.0–46.6)
IMMATURE GRANS (ABS): 0 10*3/uL (ref 0.0–0.1)
IMMATURE GRANULOCYTES: 0 %
LYMPHS: 53 %
Lymphocytes Absolute: 4.1 10*3/uL — ABNORMAL HIGH (ref 0.7–3.1)
MCH: 38.9 pg — ABNORMAL HIGH (ref 26.6–33.0)
MCHC: 35.4 g/dL (ref 31.5–35.7)
MCV: 110 fL — ABNORMAL HIGH (ref 79–97)
MONOCYTES: 13 %
MONOS ABS: 1 10*3/uL — AB (ref 0.1–0.9)
NEUTROS PCT: 31 %
Neutrophils Absolute: 2.4 10*3/uL (ref 1.4–7.0)
Platelets: 269 10*3/uL (ref 150–379)
RBC: 2.62 x10E6/uL — CL (ref 3.77–5.28)
RDW: 17.4 % — ABNORMAL HIGH (ref 12.3–15.4)
WBC: 7.8 10*3/uL (ref 3.4–10.8)

## 2015-01-16 LAB — LIPID PANEL WITH LDL/HDL RATIO
Cholesterol, Total: 149 mg/dL (ref 100–199)
HDL: 51 mg/dL (ref >39)
LDL Calculated: 67 mg/dL (ref 0–99)
LDL/HDL RATIO: 1.3 ratio (ref 0.0–3.2)
Triglycerides: 155 mg/dL — ABNORMAL HIGH (ref 0–149)
VLDL Cholesterol Cal: 31 mg/dL (ref 5–40)

## 2015-01-16 LAB — TSH: TSH: 3.36 u[IU]/mL (ref 0.450–4.500)

## 2015-01-24 DIAGNOSIS — M542 Cervicalgia: Secondary | ICD-10-CM | POA: Insufficient documentation

## 2015-02-18 ENCOUNTER — Other Ambulatory Visit: Payer: Self-pay | Admitting: Family Medicine

## 2015-02-18 DIAGNOSIS — I10 Essential (primary) hypertension: Secondary | ICD-10-CM

## 2015-04-15 DIAGNOSIS — M792 Neuralgia and neuritis, unspecified: Secondary | ICD-10-CM | POA: Insufficient documentation

## 2015-04-29 ENCOUNTER — Encounter: Payer: Self-pay | Admitting: Family Medicine

## 2015-04-29 ENCOUNTER — Ambulatory Visit: Payer: Medicare Other | Admitting: Family Medicine

## 2015-04-29 ENCOUNTER — Ambulatory Visit (INDEPENDENT_AMBULATORY_CARE_PROVIDER_SITE_OTHER): Payer: Medicare Other | Admitting: Family Medicine

## 2015-04-29 VITALS — BP 108/60 | HR 76 | Temp 97.8°F | Resp 16 | Wt 169.0 lb

## 2015-04-29 DIAGNOSIS — I10 Essential (primary) hypertension: Secondary | ICD-10-CM | POA: Diagnosis not present

## 2015-04-29 DIAGNOSIS — Z23 Encounter for immunization: Secondary | ICD-10-CM | POA: Diagnosis not present

## 2015-04-29 DIAGNOSIS — E118 Type 2 diabetes mellitus with unspecified complications: Secondary | ICD-10-CM

## 2015-04-29 LAB — POCT GLYCOSYLATED HEMOGLOBIN (HGB A1C): Hemoglobin A1C: 7.6

## 2015-04-29 NOTE — Progress Notes (Signed)
Patient ID: Phebe CollaBetty P Mullinax, female   DOB: 1925/08/16, 81 y.o.   MRN: [id number]    Subjective:  HPI  Diabetes Mellitus Type II, Follow-up:   Lab Results  Component Value Date   HGBA1C 7.9 01/14/2015   HGBA1C 7.5* 09/17/2014    Last seen for diabetes 3 months ago.  Management since then includes none. She reports good compliance with treatment. She is not having side effects.  Current symptoms include none  Home blood sugar records: Not being checked.  Episodes of hypoglycemia? no   Current Insulin Regimen: n/a Most Recent Eye Exam: less than a year Current exercise: swimming  Pertinent Labs:    Component Value Date/Time   CHOL 149 01/15/2015 0847   TRIG 155* 01/15/2015 0847   CREATININE 1.5* 08/29/2014   CREATININE 1.12 02/06/2014 1958   CREATININE 0.83 04/07/2011 1306    Wt Readings from Last 3 Encounters:  04/29/15 169 lb (76.658 kg)  01/14/15 170 lb (77.111 kg)  01/01/15 172 lb (78.019 kg)    ------------------------------------------------------------------------    Hypertension, follow-up:  BP Readings from Last 3 Encounters:  04/29/15 108/60  01/14/15 110/60  01/01/15 128/60    She was last seen for hypertension 3 months ago.  BP at that visit was 110/60. Management since that visit includes none. She reports good compliance with treatment. She is not having side effects.  She is exercising. Outside blood pressures are 130's/40-60's She is experiencing none.  Patient denies chest pain, chest pressure/discomfort, claudication, dyspnea, exertional chest pressure/discomfort, irregular heart beat, lower extremity edema, near-syncope and palpitations.      Wt Readings from Last 3 Encounters:  04/29/15 169 lb (76.658 kg)  01/14/15 170 lb (77.111 kg)  01/01/15 172 lb (78.019 kg)  ------------------------------------------------------------------------     Prior to Admission medications   Medication Sig Start Date End Date Taking?  Authorizing Provider  aspirin EC 81 MG tablet Take by mouth.   Yes Historical Provider, MD  Calcium-Magnesium-Vitamin D (CALCIUM 500 PO) Take by mouth. 08/09/11  Yes Historical Provider, MD  CHOLESTYRAMINE LIGHT PO Take by mouth. 02/13/14  Yes Historical Provider, MD  clidinium-chlordiazePOXIDE (LIBRAX) 5-2.5 MG per capsule Take by mouth. 02/26/14  Yes Historical Provider, MD  Coenzyme Q10 (CO Q10) 200 MG CAPS Take by mouth. 08/09/11  Yes Historical Provider, MD  dicyclomine (BENTYL) 20 MG tablet Take by mouth. 12/18/13  Yes Historical Provider, MD  esomeprazole (NEXIUM) 40 MG capsule Take by mouth. 10/30/13  Yes Historical Provider, MD  estrogens, conjugated, (PREMARIN) 0.3 MG tablet Take 1 tablet (0.3 mg total) by mouth daily. Take daily for 21 days then do not take for 7 days. 01/01/15  Yes Richard Hulen ShoutsL Gilbert Jr., MD  fish oil-omega-3 fatty acids 1000 MG capsule Take 1 g by mouth 2 (two) times daily.     Yes Historical Provider, MD  gabapentin (NEURONTIN) 600 MG tablet Take by mouth 3 (three) times daily.  03/21/14  Yes Historical Provider, MD  hydroxypropyl methylcellulose (ISOPTO TEARS) 2.5 % ophthalmic solution Place 1 drop into both eyes 3 (three) times daily as needed. Dry eyes    Yes Historical Provider, MD  hydrOXYzine (ATARAX/VISTARIL) 10 MG tablet Take by mouth. 02/13/13  Yes Historical Provider, MD  Lactobacillus Rhamnosus, GG, (PROBIOTIC COLIC PO) Take by mouth.   Yes Historical Provider, MD  losartan-hydrochlorothiazide (HYZAAR) 50-12.5 MG per tablet TAKE 1 TABLET BY MOUTH DAILY 02/18/15  Yes Maple Hudsonichard L Gilbert Jr., MD  magnesium oxide (MAG-OX) 400 MG tablet Take 400 mg  by mouth daily.   Yes Historical Provider, MD  magnesium oxide (MAG-OX) 400 MG tablet Take by mouth. 07/08/14  Yes Historical Provider, MD  MULTIPLE VITAMIN PO Take by mouth. 08/09/11  Yes Historical Provider, MD  Multiple Vitamins-Minerals (MULTIVITAMINS THER. W/MINERALS) TABS Take 1 tablet by mouth daily.     Yes Historical Provider,  MD  OVER THE COUNTER MEDICATION Take 1 tablet by mouth daily. Calcium plus vitamin d 600/400mg     Yes Historical Provider, MD  Psyllium 48.57 % POWD Take by mouth.   Yes Historical Provider, MD  rOPINIRole (REQUIP) 1 MG tablet Take by mouth. 10/10/12  Yes Historical Provider, MD  urea (CARMOL) 40 % CREA APP EXT AA BID 10/16/14  Yes Historical Provider, MD    Patient Active Problem List   Diagnosis Date Noted  . Neuropathic pain 04/15/2015  . Cervical pain 01/24/2015  . Clinical depression 01/01/2015  . Back pain, chronic 12/14/2014  . COPD, mild (HCC) 12/14/2014  . Anxiety, generalized 12/14/2014  . Esophageal reflux 12/14/2014  . Diabetes (HCC) 12/14/2014  . Progeria syndrome 12/14/2014  . BP (high blood pressure) 12/14/2014  . Adaptive colitis 12/14/2014  . Cannot sleep 12/14/2014  . Affective disorder, major (HCC) 12/14/2014  . Chronic anemia 12/14/2014  . Arthritis, degenerative 12/14/2014  . OP (osteoporosis) 12/14/2014  . Restless leg 12/14/2014  . Neuralgia neuritis, sciatic nerve 12/14/2014  . 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 Absolute anemia 03/03/2012  . Lumbar canal stenosis 02/10/2012  . Degeneration of intervertebral disc of lumbosacral region 11/19/2011  . NEUROPATHY 02/26/2008  . Essential hypertension 02/26/2008  . Fatigue 02/26/2008  . DYSPNEA 02/26/2008    Past Medical History  Diagnosis Date  . PONV (postoperative nausea and vomiting)   . Hypertension   . Shortness of breath   . Anemia     WAS ANEMIC  . Blood transfusion     HAD 1 SEVERAL YRS AGO...3-4 YR  . Chronic kidney disease     SOME DECREASE IN KIDNEY FUNCTION  . Arthritis   . Normal cardiac stress test     REQUESTING COPY FROM [facility]  . Normal echocardiogram     REQUESTING THAT RESULT -- DR Park Breed   . Spondylolysis 1998    Social History   Social History  . Marital Status: Married    Spouse Name: N/A  . Number of Children: N/A  . Years of  Education: N/A   Occupational History  . Not on file.   Social History Main Topics  . Smoking status: Never Smoker   . Smokeless tobacco: Not on file  . Alcohol Use: No  . Drug Use: No  . Sexual Activity: No   Other Topics Concern  . Not on file   Social History Narrative    Allergies  Allergen Reactions  . Acetaminophen Itching and Other (See Comments)    Stomach upset  . Aspirin Itching and Other (See Comments)    Stomach pain  . Other Other (See Comments)    Novocaine, strange feeling  . Sulfa Antibiotics Other (See Comments)  . Caffeine Palpitations  . Ginger Palpitations  . Ibuprofen Rash    Review of Systems  Constitutional: Positive for malaise/fatigue.  Eyes: Negative.   Respiratory: Negative.   Cardiovascular: Negative.   Gastrointestinal: Negative.   Genitourinary: Negative.   Musculoskeletal: Positive for back pain and neck pain.  Skin: Positive for itching (from dermatology procedure today).  Neurological: Negative.   Endo/Heme/Allergies: Negative.  Psychiatric/Behavioral: Negative.     There is no immunization history for the selected administration types on file for this patient. Objective:  BP 108/60 mmHg  Pulse 76  Temp(Src) 97.8 F (36.6 C) (Oral)  Resp 16  Wt 169 lb (76.658 kg)  Physical Exam  Constitutional: She is oriented to person, place, and time and well-developed, well-nourished, and in no distress.  Eyes: Conjunctivae and EOM are normal. Pupils are equal, round, and reactive to light.  Neck: Normal range of motion. Neck supple.  Cardiovascular: Normal rate, regular rhythm, normal heart sounds and intact distal pulses.   Pulmonary/Chest: Effort normal and breath sounds normal.  Abdominal: Soft. Bowel sounds are normal.  Musculoskeletal: Normal range of motion.  Significant increase in thoracic kyphosis.  Neurological: She is alert and oriented to person, place, and time. She has normal reflexes. Gait normal. GCS score is 15.    Skin: Skin is warm and dry.  Psychiatric: Mood, memory, affect and judgment normal.    Lab Results  Component Value Date   WBC 7.8 01/15/2015   HGB 9.8* 09/17/2014   HCT 28.8* 01/15/2015   PLT 285 09/17/2014   GLUCOSE 123* 02/06/2014   CHOL 149 01/15/2015   TRIG 155* 01/15/2015   HDL 51 01/15/2015   LDLCALC 67 01/15/2015   TSH 3.360 01/15/2015   HGBA1C 7.9 01/14/2015    CMP     Component Value Date/Time   NA 142 08/29/2014   NA 139 02/06/2014 1958   NA 139 04/07/2011 1306   K 4.7 08/29/2014   K 3.9 02/06/2014 1958   CL 103 02/06/2014 1958   CL 103 04/07/2011 1306   CO2 30 02/06/2014 1958   CO2 31 04/07/2011 1306   GLUCOSE 123* 02/06/2014 1958   GLUCOSE 144* 04/07/2011 1306   BUN 21 08/29/2014   BUN 20* 02/06/2014 1958   BUN 16 04/07/2011 1306   CREATININE 1.5* 08/29/2014   CREATININE 1.12 02/06/2014 1958   CREATININE 0.83 04/07/2011 1306   CALCIUM 9.8 02/06/2014 1958   CALCIUM 9.3 04/07/2011 1306   PROT 7.5 02/06/2014 1958   ALBUMIN 3.9 02/06/2014 1958   AST 27 08/29/2014   AST 31 02/06/2014 1958   ALT 23 08/29/2014   ALT 31 02/06/2014 1958   ALKPHOS 70 08/29/2014   ALKPHOS 76 02/06/2014 1958   BILITOT 0.5 02/06/2014 1958   GFRNONAA 44* 02/06/2014 1958   GFRNONAA 63* 04/07/2011 1306   GFRAA 52* 02/06/2014 1958   GFRAA 74* 04/07/2011 1306    Assessment and Plan :  1. Essential hypertension Pt to continue to monitor at home.   2. Type 2 diabetes mellitus with complication, without long-term current use of insulin (HCC) More than 50% of visit spent reviewing chronic issues/in counselling. - POCT HgB A1C- 7.6 today. Continue to follow. 3. Chronic neuropathy Pt considring nutriceutical called Mentanx which would be safe for her to try. 4Chronic Anxiety 4.OA 5.Osteoporosis 6.DDD/Spinal Stenosis I have done the exam and reviewed the above chart and it is accurate to the best of my knowledge.  3. Need for influenza vaccination  - Flu vaccine HIGH  DOSE PF  4. Need for pneumococcal vaccination  - Pneumococcal conjugate vaccine 13-valent IM  Patient was seen and examined by Dr. Julieanne Manson, and noted scribed by Dimas Chyle, CMA   Julieanne Manson MD [facility] 04/29/2015 2:52 PM

## 2015-07-20 ENCOUNTER — Other Ambulatory Visit: Payer: Self-pay | Admitting: Family Medicine

## 2015-08-27 ENCOUNTER — Encounter: Payer: Self-pay | Admitting: Family Medicine

## 2015-08-27 ENCOUNTER — Ambulatory Visit (INDEPENDENT_AMBULATORY_CARE_PROVIDER_SITE_OTHER): Payer: Medicare Other | Admitting: Family Medicine

## 2015-08-27 ENCOUNTER — Encounter: Payer: Self-pay | Admitting: General Surgery

## 2015-08-27 VITALS — BP 100/58 | HR 94 | Temp 98.2°F | Resp 14 | Wt 171.0 lb

## 2015-08-27 DIAGNOSIS — R5383 Other fatigue: Secondary | ICD-10-CM | POA: Diagnosis not present

## 2015-08-27 DIAGNOSIS — D649 Anemia, unspecified: Secondary | ICD-10-CM

## 2015-08-27 DIAGNOSIS — G629 Polyneuropathy, unspecified: Secondary | ICD-10-CM

## 2015-08-27 DIAGNOSIS — M48061 Spinal stenosis, lumbar region without neurogenic claudication: Secondary | ICD-10-CM

## 2015-08-27 DIAGNOSIS — M4806 Spinal stenosis, lumbar region: Secondary | ICD-10-CM | POA: Diagnosis not present

## 2015-08-27 DIAGNOSIS — R1011 Right upper quadrant pain: Secondary | ICD-10-CM

## 2015-08-27 DIAGNOSIS — E118 Type 2 diabetes mellitus with unspecified complications: Secondary | ICD-10-CM | POA: Diagnosis not present

## 2015-08-27 DIAGNOSIS — I9589 Other hypotension: Secondary | ICD-10-CM

## 2015-08-27 DIAGNOSIS — M5430 Sciatica, unspecified side: Secondary | ICD-10-CM

## 2015-08-27 LAB — POCT GLYCOSYLATED HEMOGLOBIN (HGB A1C): Hemoglobin A1C: 7.3

## 2015-08-27 MED ORDER — LIDOCAINE 5 % EX PTCH: 1.0000 | MEDICATED_PATCH | CUTANEOUS | Status: DC

## 2015-08-27 MED ORDER — LOSARTAN POTASSIUM 50 MG PO TABS: 50.0000 mg | ORAL_TABLET | Freq: Every day | ORAL | Status: DC

## 2015-08-27 NOTE — Progress Notes (Signed)
Patient ID: Renee Carpenter, female   DOB: 1925-06-18, 82 y.o.   MRN: [id number]    Subjective:  HPI  Patient is here for follow up-last office visit was in November 2016.  Diabetes: Patient does not check her sugar at home. . Lab Results  Component Value Date   HGBA1C 7.6 04/29/2015   Hypertension: patient checks her B/P and readings are usually around 130/80-70. BP Readings from Last 3 Encounters:  08/27/15 100/58  04/29/15 108/60  01/14/15 110/60   Last labs 01/15/15  Prior to Admission medications   Medication Sig Start Date End Date Taking? Authorizing Provider  aspirin EC 81 MG tablet Take by mouth.   Yes Historical Provider, MD  Calcium-Magnesium-Vitamin D (CALCIUM 500 PO) Take by mouth. 08/09/11  Yes Historical Provider, MD  Coenzyme Q10 (CO Q10) 200 MG CAPS Take by mouth. 08/09/11  Yes Historical Provider, MD  esomeprazole (NEXIUM) 40 MG capsule Take by mouth. 10/30/13  Yes Historical Provider, MD  estrogens, conjugated, (PREMARIN) 0.3 MG tablet Take 1 tablet (0.3 mg total) by mouth daily. Take daily for 21 days then do not take for 7 days. 01/01/15  Yes Richard Hulen ShoutsL Gilbert Jr., MD  fish oil-omega-3 fatty acids 1000 MG capsule Take 1 g by mouth 2 (two) times daily.     Yes Historical Provider, MD  gabapentin (NEURONTIN) 600 MG tablet Take by mouth 3 (three) times daily. Takes 300 mg 1 tablet in the morning and 1 in the afternoon and then takes 600 mg at bedtime 03/21/14  Yes Historical Provider, MD  hydroxypropyl methylcellulose (ISOPTO TEARS) 2.5 % ophthalmic solution Place 1 drop into both eyes 3 (three) times daily as needed. Dry eyes    Yes Historical Provider, MD  hydrOXYzine (ATARAX/VISTARIL) 10 MG tablet Take by mouth. 02/13/13  Yes Historical Provider, MD  losartan-hydrochlorothiazide (HYZAAR) 50-12.5 MG per tablet TAKE 1 TABLET BY MOUTH DAILY 02/18/15  Yes Maple Hudsonichard L Gilbert Jr., MD  MAGNESIUM-OXIDE 400 (241.3 Mg) MG tablet TAKE 1 TABLET BY MOUTH TWICE DAILY 07/21/15  Yes  Maple Hudsonichard L Gilbert Jr., MD  MULTIPLE VITAMIN PO Take by mouth. 08/09/11  Yes Historical Provider, MD  rOPINIRole (REQUIP) 1 MG tablet Take 1 mg by mouth. 1/2 tablet four times daily. 10/10/12  Yes Historical Provider, MD  traMADol-acetaminophen (ULTRACET) 37.5-325 MG tablet TK 1 T PO QD PRN FOR UP TO 30 DAYS FOR PAIN 06/11/15  Yes Historical Provider, MD    Patient Active Problem List   Diagnosis Date Noted  . Neuropathic pain 04/15/2015  . Cervical pain 01/24/2015  . Clinical depression 01/01/2015  . Back pain, chronic 12/14/2014  . COPD, mild (HCC) 12/14/2014  . Anxiety, generalized 12/14/2014  . Esophageal reflux 12/14/2014  . Diabetes (HCC) 12/14/2014  . Progeria syndrome 12/14/2014  . BP (high blood pressure) 12/14/2014  . Adaptive colitis 12/14/2014  . Cannot sleep 12/14/2014  . Affective disorder, major (HCC) 12/14/2014  . Chronic anemia 12/14/2014  . Arthritis, degenerative 12/14/2014  . OP (osteoporosis) 12/14/2014  . Restless leg 12/14/2014  . Neuralgia neuritis, sciatic nerve 12/14/2014  . 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 Absolute anemia 03/03/2012  . Lumbar canal stenosis 02/10/2012  . Degeneration of intervertebral disc of lumbosacral region 11/19/2011  . NEUROPATHY 02/26/2008  . Essential hypertension 02/26/2008  . Fatigue 02/26/2008  . DYSPNEA 02/26/2008    Past Medical History  Diagnosis Date  . PONV (postoperative nausea and vomiting)   . Hypertension   . Shortness of breath   .  Anemia     WAS ANEMIC  . Blood transfusion     HAD 1 SEVERAL YRS AGO...3-4 YR  . Chronic kidney disease     SOME DECREASE IN KIDNEY FUNCTION  . Arthritis   . Normal cardiac stress test     REQUESTING COPY FROM [facility]  . Normal echocardiogram     REQUESTING THAT RESULT -- DR Park Breed   . Spondylolysis 1998    Social History   Social History  . Marital Status: Married    Spouse Name: N/A  . Number of Children: N/A  . Years of  Education: N/A   Occupational History  . Not on file.   Social History Main Topics  . Smoking status: Never Smoker   . Smokeless tobacco: Never Used  . Alcohol Use: No  . Drug Use: No  . Sexual Activity: No   Other Topics Concern  . Not on file   Social History Narrative    Allergies  Allergen Reactions  . Acetaminophen Itching and Other (See Comments)    Stomach upset  . Aspirin Itching and Other (See Comments)    Stomach pain  . Other Other (See Comments)    Novocaine, strange feeling  . Sulfa Antibiotics Other (See Comments)  . Caffeine Palpitations  . Ginger Palpitations  . Ibuprofen Rash    Review of Systems  Constitutional: Positive for malaise/fatigue.  Eyes: Negative.   Respiratory: Positive for shortness of breath (gradually worsening).   Cardiovascular: Negative.   Gastrointestinal: Negative.   Musculoskeletal: Positive for myalgias (muscle cramps), back pain and joint pain.  Skin: Negative.   Neurological: Positive for tingling (in her feet, also has had nerve irritation/sciatica issues worsening the past few weeks.) and weakness.       Unsteadiness-uses walker  Psychiatric/Behavioral: Negative.     Immunization History  Administered Date(s) Administered  . Influenza, High Dose Seasonal PF 04/29/2015  . Pneumococcal Conjugate-13 04/29/2015   Objective:  BP 100/58 mmHg  Pulse 94  Temp(Src) 98.2 F (36.8 C)  Resp 14  Wt 171 lb (77.565 kg)  Physical Exam  Constitutional: She is oriented to person, place, and time and well-developed, well-nourished, and in no distress.  HENT:  Head: Normocephalic and atraumatic.  Eyes: Conjunctivae are normal. Pupils are equal, round, and reactive to light.  Cardiovascular: Normal rate, regular rhythm, normal heart sounds and intact distal pulses.   No murmur heard. Pulmonary/Chest: Effort normal and breath sounds normal. No respiratory distress. She has no wheezes.  Musculoskeletal: She exhibits edema (trace).   Neurological: She is alert and oriented to person, place, and time. She is not agitated and not disoriented. She displays no tremor and normal speech. Gait (uses walker) abnormal.  Psychiatric: Mood, affect and judgment normal.    Lab Results  Component Value Date   WBC 7.8 01/15/2015   HGB 9.8* 09/17/2014   HCT 28.8* 01/15/2015   PLT 269 01/15/2015   GLUCOSE 123* 02/06/2014   CHOL 149 01/15/2015   TRIG 155* 01/15/2015   HDL 51 01/15/2015   LDLCALC 67 01/15/2015   TSH 3.360 01/15/2015   HGBA1C 7.6 04/29/2015    CMP     Component Value Date/Time   NA 142 08/29/2014   NA 139 02/06/2014 1958   NA 139 04/07/2011 1306   K 4.7 08/29/2014   K 3.9 02/06/2014 1958   CL 103 02/06/2014 1958   CL 103 04/07/2011 1306   CO2 30 02/06/2014 1958   CO2 31 04/07/2011 1306  GLUCOSE 123* 02/06/2014 1958   GLUCOSE 144* 04/07/2011 1306   BUN 21 08/29/2014   BUN 20* 02/06/2014 1958   BUN 16 04/07/2011 1306   CREATININE 1.5* 08/29/2014   CREATININE 1.12 02/06/2014 1958   CREATININE 0.83 04/07/2011 1306   CALCIUM 9.8 02/06/2014 1958   CALCIUM 9.3 04/07/2011 1306   PROT 7.5 02/06/2014 1958   ALBUMIN 3.9 02/06/2014 1958   AST 27 08/29/2014   AST 31 02/06/2014 1958   ALT 23 08/29/2014   ALT 31 02/06/2014 1958   ALKPHOS 70 08/29/2014   ALKPHOS 76 02/06/2014 1958   BILITOT 0.5 02/06/2014 1958   GFRNONAA 44* 02/06/2014 1958   GFRNONAA 63* 04/07/2011 1306   GFRAA 52* 02/06/2014 1958   GFRAA 74* 04/07/2011 1306    Assessment and Plan :  1. Other specified hypotension Today. She symptomatic today-increased fatigue, lightheaded. Will stop HCTZ part. Continue Losartan 50 mg. May need to make further changes. Re check on the next visit.  2. Type 2 diabetes mellitus with complication, without long-term current use of insulin (HCC) A1C 7.3 today, better. Continue working on habits, controlled with habits. Continue to follow. More than 50% of today's visit spent in counseling. 3. Other  fatigue  4. Sciatica, unspecified laterality Worsening gradually. Advised patient to use heat to the affected area. Also try Lidoderm apply topically every 12 hours as needed.  5. Lumbar canal stenosis Has had back surgeries in the past.  Patient has chronic back issues. She is followed by neurosurgery. 6. Chronic anemia Stable in august. Will check labs on the next visit.  7. Neuropathy (HCC)    Patient was seen and examined by Dr. Bosie Clos and note was scribed by Samara Deist, RMA.    Julieanne Manson MD [facility] 08/27/2015 2:03 PM

## 2015-09-11 ENCOUNTER — Encounter: Payer: Self-pay | Admitting: General Surgery

## 2015-09-11 ENCOUNTER — Ambulatory Visit (INDEPENDENT_AMBULATORY_CARE_PROVIDER_SITE_OTHER): Payer: Medicare Other | Admitting: General Surgery

## 2015-09-11 VITALS — BP 144/68 | HR 88 | Resp 14 | Ht 66.0 in | Wt 168.0 lb

## 2015-09-11 DIAGNOSIS — R1011 Right upper quadrant pain: Secondary | ICD-10-CM

## 2015-09-11 NOTE — Patient Instructions (Signed)
Discussed options of CT scan vs observation. The patient is aware to call back for any questions, concerns or worsening symtoms.

## 2015-09-11 NOTE — Progress Notes (Signed)
Patient ID: Renee Carpenter, female   DOB: 10-22-1925, 82 y.o.   MRN: [id number]  Chief Complaint  Patient presents with  . Other    HPI Renee Carpenter is a 82 y.o. female.  Here today for evaluation of possible hernia. She states she feels a bulge in the right upper abdomen that comes and goes. She states sneezing can trigger the bulge Presentation. She has noticed this for about 1 year or more. She also has dull pain with the bulge, she lays down to relax, applies pressure and the pain goes away within minutes.  She states it happens 3-4 times a month. The patient is not aware of any visual protuberance during these episodes. Denies nausea or vomiting.She does have episodes of constipation with diarrhea. Bowels move every other day. She had a chest x ray and abdominal x ray 08-30-14.I personally reviewed the patient's history.  HPI  Past Medical History  Diagnosis Date  . PONV (postoperative nausea and vomiting)   . Hypertension   . Shortness of breath   . Anemia     WAS ANEMIC  . Blood transfusion     HAD 1 SEVERAL YRS AGO...3-4 YR  . Chronic kidney disease     SOME DECREASE IN KIDNEY FUNCTION  . Arthritis   . Normal cardiac stress test     REQUESTING COPY FROM [facility]  . Normal echocardiogram     REQUESTING THAT RESULT -- DR Park Breed   . Spondylolysis 1998  . Diabetes mellitus without complication [facility])     Past Surgical History  Procedure Laterality Date  . Abdominal hysterectomy    . Back surgery      2 CERVICAL SURGERIES ALSO  . Cataracts      RIGHT EYE CATARACT  . Lumbar laminectomy/decompression microdiscectomy  04/14/2011    Procedure: LUMBAR LAMINECTOMY/DECOMPRESSION MICRODISCECTOMY;  Surgeon: Cristi Loron;  Location: [facility];  Service: Neurosurgery;  Laterality: N/A;  Lumbar Three and Lumbar Four Laminectomy  . Cervical discectomy  08/1995 and 2011    C4-,C5-6, C6-7 anterior cervical; Dr. Danielle Dess fusion and plating  . Tonsillectomy and  adenoidectomy  1939  . Laminectomy      decompressive; Dr. Polo Riley in [facility]    Family History  Problem Relation Age of Onset  . Heart failure Father   . Heart disease Father   . Hypertension Mother   . Diabetes Mother   . Heart disease Mother   . Cancer Sister     breast  . Cancer Maternal Grandfather   . Cancer Maternal Aunt     x3    Social History Social History  Substance Use Topics  . Smoking status: Never Smoker   . Smokeless tobacco: Never Used  . Alcohol Use: No    Allergies  Allergen Reactions  . Acetaminophen Itching and Other (See Comments)    Stomach upset  . Aspirin Itching and Other (See Comments)    Stomach pain  . Other Other (See Comments)    Novocaine, strange feeling  . Sulfa Antibiotics Other (See Comments)  . Caffeine Palpitations  . Ginger Palpitations  . Ibuprofen Rash    Current Outpatient Prescriptions  Medication Sig Dispense Refill  . aspirin EC 81 MG tablet Take by mouth.    . Calcium-Magnesium-Vitamin D (CALCIUM 500 PO) Take by mouth.    . Coenzyme Q10 (CO Q10) 200 MG CAPS Take by mouth.    . esomeprazole (NEXIUM) 40 MG capsule Take by mouth.    Marland Kitchen  estrogens, conjugated, (PREMARIN) 0.3 MG tablet Take 1 tablet (0.3 mg total) by mouth daily. Take daily for 21 days then do not take for 7 days. 30 tablet 12  . fish oil-omega-3 fatty acids 1000 MG capsule Take 1 g by mouth 2 (two) times daily.      Marland Kitchen. gabapentin (NEURONTIN) 600 MG tablet Take by mouth 3 (three) times daily. Takes 300 mg 1 tablet in the morning and 1 in the afternoon and then takes 600 mg at bedtime    . hydroxypropyl methylcellulose (ISOPTO TEARS) 2.5 % ophthalmic solution Place 1 drop into both eyes 3 (three) times daily as needed. Dry eyes     . hydrOXYzine (ATARAX/VISTARIL) 10 MG tablet Take by mouth.    . lidocaine (LIDODERM) 5 % Place 1 patch onto the skin daily. Remove & Discard patch within 12 hours or as directed by MD 30 patch 2  . losartan (COZAAR) 50 MG tablet Take 1  tablet (50 mg total) by mouth daily. 90 tablet 3  . MAGNESIUM-OXIDE 400 (241.3 Mg) MG tablet TAKE 1 TABLET BY MOUTH TWICE DAILY 60 tablet 0  . MULTIPLE VITAMIN PO Take by mouth.    Marland Kitchen. rOPINIRole (REQUIP) 1 MG tablet Take 1 mg by mouth. 1/2 tablet four times daily.    . traMADol-acetaminophen (ULTRACET) 37.5-325 MG tablet TK 1 T PO QD PRN FOR UP TO 30 DAYS FOR PAIN  0   No current facility-administered medications for this visit.    Review of Systems Review of Systems  Constitutional: Negative.   Respiratory: Negative.   Cardiovascular: Negative.     Blood pressure 144/68, pulse 88, resp. rate 14, height 5\' 6"  (1.676 m), weight 168 lb (76.204 kg).  Physical Exam Physical Exam  Constitutional: She is oriented to person, place, and time. She appears well-developed and well-nourished.  HENT:  Mouth/Throat: Oropharynx is clear and moist.  Eyes: Conjunctivae are normal. No scleral icterus.  Neck: Neck supple.  Cardiovascular: Normal rate, regular rhythm and normal heart sounds.   Pulmonary/Chest: Effort normal and breath sounds normal.  Abdominal: Soft. Normal appearance and bowel sounds are normal. There is no tenderness.    Lymphadenopathy:    She has no cervical adenopathy.  Neurological: She is alert and oriented to person, place, and time.  Skin: Skin is warm and dry.  Psychiatric: Her behavior is normal.    Data Reviewed PCP notes. March 2016 plain films of the chest and abdomen.   Assessment    Intermittent right upper quadrant pain, no palpable abnormality.    Plan        Discussed options of CT scan vs observation. Imaging would be undertaken only if there were plans to act on the findings. At this time the patient would not consider surgical repair. The patient is aware to call back for any questions, concerns or worsening symtoms.    PCP:  Julieanne MansonGilbert, Richard This information has been scribed by Dorathy DaftMarsha Hatch RN, BSN,BC.  Marland Kitchen. Earline MayotteByrnett, Edenilson Austad W 09/11/2015, 1:58  PM

## 2015-09-12 ENCOUNTER — Telehealth: Payer: Self-pay

## 2015-09-12 ENCOUNTER — Other Ambulatory Visit: Payer: Self-pay

## 2015-09-12 DIAGNOSIS — J189 Pneumonia, unspecified organism: Secondary | ICD-10-CM

## 2015-09-12 MED ORDER — AZITHROMYCIN 250 MG PO TABS: ORAL_TABLET | ORAL | Status: DC

## 2015-09-12 NOTE — Telephone Encounter (Signed)
Patient had CXR that revealed a new pneumonia.  Rx for ZPak has been sent to the pharmacy.  Dr Reece AgarG also wanted referral to Pulmonology done, this has been ordered.  Patient [facility]

## 2015-09-12 NOTE — Telephone Encounter (Signed)
Entered in Error, this message was to be for Renee Carpenter, not Renee Carpenter.   ED

## 2015-09-15 NOTE — Telephone Encounter (Signed)
Noted-aa 

## 2015-10-06 ENCOUNTER — Ambulatory Visit: Payer: Medicare Other | Admitting: Family Medicine

## 2015-10-12 ENCOUNTER — Other Ambulatory Visit: Payer: Self-pay | Admitting: Family Medicine

## 2015-11-22 ENCOUNTER — Other Ambulatory Visit: Payer: Self-pay | Admitting: Family Medicine

## 2016-01-17 ENCOUNTER — Other Ambulatory Visit: Payer: Self-pay | Admitting: Family Medicine

## 2016-01-17 DIAGNOSIS — Z90710 Acquired absence of both cervix and uterus: Principal | ICD-10-CM

## 2016-01-17 DIAGNOSIS — E8940 Asymptomatic postprocedural ovarian failure: Secondary | ICD-10-CM

## 2016-01-21 ENCOUNTER — Telehealth: Payer: Self-pay

## 2016-01-21 DIAGNOSIS — Z90710 Acquired absence of both cervix and uterus: Principal | ICD-10-CM

## 2016-01-21 DIAGNOSIS — E8940 Asymptomatic postprocedural ovarian failure: Secondary | ICD-10-CM

## 2016-01-21 NOTE — Telephone Encounter (Signed)
[facility] [address] [facility] Premarin [facility]. Please review-aa

## 2016-02-02 MED ORDER — ESTROGENS CONJUGATED 0.3 MG PO TABS: ORAL_TABLET | ORAL | 11 refills | Status: DC

## 2016-02-02 NOTE — Telephone Encounter (Signed)
Ok to rf 0.3 mg tablets for 1 year.

## 2016-02-02 NOTE — Telephone Encounter (Signed)
Done-aa 

## 2016-02-02 NOTE — Addendum Note (Signed)
Addended by: Miachel RouxALEKSANDROVA, ANASTASIYA V on: 02/02/2016 02:33 PM   Modules accepted: Orders

## 2016-02-26 ENCOUNTER — Ambulatory Visit (INDEPENDENT_AMBULATORY_CARE_PROVIDER_SITE_OTHER): Payer: Medicare Other | Admitting: Family Medicine

## 2016-02-26 ENCOUNTER — Encounter: Payer: Self-pay | Admitting: Family Medicine

## 2016-02-26 VITALS — BP 124/56 | HR 86 | Temp 98.1°F | Resp 16 | Wt 168.0 lb

## 2016-02-26 DIAGNOSIS — R2681 Unsteadiness on feet: Secondary | ICD-10-CM | POA: Diagnosis not present

## 2016-02-26 DIAGNOSIS — G8929 Other chronic pain: Secondary | ICD-10-CM

## 2016-02-26 DIAGNOSIS — R350 Frequency of micturition: Secondary | ICD-10-CM | POA: Diagnosis not present

## 2016-02-26 DIAGNOSIS — I10 Essential (primary) hypertension: Secondary | ICD-10-CM | POA: Diagnosis not present

## 2016-02-26 DIAGNOSIS — E118 Type 2 diabetes mellitus with unspecified complications: Secondary | ICD-10-CM | POA: Diagnosis not present

## 2016-02-26 DIAGNOSIS — E781 Pure hyperglyceridemia: Secondary | ICD-10-CM | POA: Diagnosis not present

## 2016-02-26 DIAGNOSIS — N3090 Cystitis, unspecified without hematuria: Secondary | ICD-10-CM

## 2016-02-26 DIAGNOSIS — G629 Polyneuropathy, unspecified: Secondary | ICD-10-CM

## 2016-02-26 LAB — POCT URINALYSIS DIPSTICK
BILIRUBIN UA: NEGATIVE
GLUCOSE UA: NEGATIVE
KETONES UA: NEGATIVE
Leukocytes, UA: NEGATIVE
Nitrite, UA: NEGATIVE
Protein, UA: NEGATIVE
SPEC GRAV UA: 1.015
UROBILINOGEN UA: 0.2
pH, UA: 7.5

## 2016-02-26 MED ORDER — TRAMADOL HCL 50 MG PO TABS: 50.0000 mg | ORAL_TABLET | Freq: Four times a day (QID) | ORAL | 1 refills | Status: DC | PRN

## 2016-02-26 MED ORDER — NITROFURANTOIN MACROCRYSTAL 100 MG PO CAPS: 100.0000 mg | ORAL_CAPSULE | Freq: Two times a day (BID) | ORAL | 0 refills | Status: DC

## 2016-02-26 MED ORDER — GABAPENTIN 600 MG PO TABS: 600.0000 mg | ORAL_TABLET | Freq: Three times a day (TID) | ORAL | 12 refills | Status: DC

## 2016-02-26 NOTE — Progress Notes (Signed)
Subjective:  HPI Pt is here today for urinary symptoms. She reports that she feels like she has to urinate more than normal but she can not go much when she gets there. She also reports that she has some incontinence that she has never had before. Also she has been having more back pain than normal. Her urinary symptoms have been going on for about a month.   She also is complaining of neck pain and she has seen physiatry for this and he feels like it is related to the crack in her neck where the surgery was but she wants to know if Dr. Sullivan Lone thinks it is probably due to that or if it could be something else.   Prior to Admission medications   Medication Sig Start Date End Date Taking? Authorizing Provider  aspirin EC 81 MG tablet Take by mouth.    Historical Provider, MD  azithromycin (ZITHROMAX) 250 MG tablet 2 PO day 1 and then 1 daily for day 2-4 09/12/15   Maple Hudson., MD  Calcium-Magnesium-Vitamin D (CALCIUM 500 PO) Take by mouth. 08/09/11   Historical Provider, MD  Coenzyme Q10 (CO Q10) 200 MG CAPS Take by mouth. 08/09/11   Historical Provider, MD  esomeprazole (NEXIUM) 40 MG capsule Take by mouth. 10/30/13   Historical Provider, MD  esomeprazole (NEXIUM) 40 MG capsule TAKE 1 CAPSULE BY MOUTH EVERY DAY 11/23/15   Maple Hudson., MD  estrogens, conjugated, (PREMARIN) 0.3 MG tablet TAKE 1 TABLET BY MOUTH EVERY DAY FOR 21 DAYS THEN DO NOT TAKE FOR 7 DAYS 02/02/16   Maple Hudson., MD  fish oil-omega-3 fatty acids 1000 MG capsule Take 1 g by mouth 2 (two) times daily.      Historical Provider, MD  gabapentin (NEURONTIN) 600 MG tablet Take by mouth 3 (three) times daily. Takes 300 mg 1 tablet in the morning and 1 in the afternoon and then takes 600 mg at bedtime 03/21/14   Historical Provider, MD  hydroxypropyl methylcellulose (ISOPTO TEARS) 2.5 % ophthalmic solution Place 1 drop into both eyes 3 (three) times daily as needed. Dry eyes     Historical Provider, MD    hydrOXYzine (ATARAX/VISTARIL) 10 MG tablet Take by mouth. 02/13/13   Historical Provider, MD  lidocaine (LIDODERM) 5 % Place 1 patch onto the skin daily. Remove & Discard patch within 12 hours or as directed by MD 08/27/15   Maple Hudson., MD  losartan (COZAAR) 50 MG tablet Take 1 tablet (50 mg total) by mouth daily. 08/27/15   Maple Hudson., MD  MAGNESIUM-OXIDE 400 (241.3 Mg) MG tablet TAKE 1 TABLET BY MOUTH TWICE DAILY 10/12/15   Maple Hudson., MD  MULTIPLE VITAMIN PO Take by mouth. 08/09/11   Historical Provider, MD  rOPINIRole (REQUIP) 1 MG tablet Take 1 mg by mouth. 1/2 tablet four times daily. 10/10/12   Historical Provider, MD  traMADol-acetaminophen (ULTRACET) 37.5-325 MG tablet TK 1 T PO QD PRN FOR UP TO 30 DAYS FOR PAIN 06/11/15   Historical Provider, MD    Patient Active Problem List   Diagnosis Date Noted  . Right upper quadrant pain 09/11/2015  . Neuropathic pain 04/15/2015  . Cervical pain 01/24/2015  . Clinical depression 01/01/2015  . Back pain, chronic 12/14/2014  . COPD, mild (HCC) 12/14/2014  . Anxiety, generalized 12/14/2014  . Esophageal reflux 12/14/2014  . Diabetes (HCC) 12/14/2014  . Progeria syndrome 12/14/2014  . BP (high  blood pressure) 12/14/2014  . Adaptive colitis 12/14/2014  . Cannot sleep 12/14/2014  . Affective disorder, major (HCC) 12/14/2014  . Chronic anemia 12/14/2014  . Arthritis, degenerative 12/14/2014  . OP (osteoporosis) 12/14/2014  . Restless leg 12/14/2014  . Neuralgia neuritis, sciatic nerve 12/14/2014  . 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 Absolute anemia 03/03/2012  . Lumbar canal stenosis 02/10/2012  . Degeneration of intervertebral disc of lumbosacral region 11/19/2011  . Neuropathy (HCC) 02/26/2008  . Essential hypertension 02/26/2008  . Fatigue 02/26/2008  . DYSPNEA 02/26/2008    Past Medical History:  Diagnosis Date  . Anemia    WAS ANEMIC  . Arthritis   . Blood transfusion     HAD 1 SEVERAL YRS AGO...3-4 YR  . Chronic kidney disease    SOME DECREASE IN KIDNEY FUNCTION  . Diabetes mellitus without complication (HCC)   . Hypertension   . Normal cardiac stress test    REQUESTING COPY FROM [facility]  . Normal echocardiogram    REQUESTING THAT RESULT -- DR Park BreedKAHN   . PONV (postoperative nausea and vomiting)   . Shortness of breath   . Spondylolysis 1998    Social History   Social History  . Marital status: Married    Spouse name: N/A  . Number of children: N/A  . Years of education: N/A   Occupational History  . Not on file.   Social History Main Topics  . Smoking status: Never Smoker  . Smokeless tobacco: Never Used  . Alcohol use No  . Drug use: No  . Sexual activity: No   Other Topics Concern  . Not on file   Social History Narrative  . No narrative on file    Allergies  Allergen Reactions  . Acetaminophen Itching and Other (See Comments)    Stomach upset  . Aspirin Itching and Other (See Comments)    Stomach pain  . Other Other (See Comments)    Novocaine, strange feeling  . Sulfa Antibiotics Other (See Comments)  . Caffeine Palpitations  . Ginger Palpitations  . Ibuprofen Rash    Review of Systems  Constitutional: Positive for malaise/fatigue.  Eyes: Negative.   Respiratory: Negative.   Cardiovascular: Negative.   Gastrointestinal: Negative.   Genitourinary: Positive for frequency.  Musculoskeletal: Positive for back pain, joint pain and neck pain.  Skin: Negative.   Neurological: Negative.   Endo/Heme/Allergies: Negative.   Psychiatric/Behavioral: Negative.     Immunization History  Administered Date(s) Administered  . Influenza, High Dose Seasonal PF 04/29/2015  . Pneumococcal Conjugate-13 04/29/2015   Objective:  BP (!) 124/56 (BP Location: Left Arm, Patient Position: Sitting, Cuff Size: Normal)   Pulse 86   Temp 98.1 F (36.7 C) (Oral)   Resp 16   Wt 168 lb (76.2 kg)   BMI 27.12 kg/m   Physical Exam   Constitutional: She is oriented to person, place, and time and well-developed, well-nourished, and in no distress.  HENT:  Head: Normocephalic and atraumatic.  Eyes: Conjunctivae and EOM are normal. Pupils are equal, round, and reactive to light.  Neck: Normal range of motion. Neck supple.  Cardiovascular: Normal rate, regular rhythm, normal heart sounds and intact distal pulses.   Pulmonary/Chest: Effort normal and breath sounds normal.  Abdominal: Soft.  Musculoskeletal: Normal range of motion.  Significantly  increased thoracic kyphosis  Neurological: She is alert and oriented to person, place, and time. She has normal reflexes. Gait normal. GCS score is 15.  Skin: Skin  is warm and dry.  Psychiatric: Mood, memory, affect and judgment normal.    Lab Results  Component Value Date   WBC 7.8 01/15/2015   HGB 9.8 (A) 09/17/2014   HCT 28.8 (L) 01/15/2015   PLT 269 01/15/2015   GLUCOSE 123 (H) 02/06/2014   CHOL 149 01/15/2015   TRIG 155 (H) 01/15/2015   HDL 51 01/15/2015   LDLCALC 67 01/15/2015   TSH 3.360 01/15/2015   HGBA1C 7.3 08/27/2015    CMP     Component Value Date/Time   NA 142 08/29/2014   NA 139 02/06/2014 1958   K 4.7 08/29/2014   K 3.9 02/06/2014 1958   CL 103 02/06/2014 1958   CO2 30 02/06/2014 1958   GLUCOSE 123 (H) 02/06/2014 1958   BUN 21 08/29/2014   BUN 20 (H) 02/06/2014 1958   CREATININE 1.5 (A) 08/29/2014   CREATININE 1.12 02/06/2014 1958   CALCIUM 9.8 02/06/2014 1958   PROT 7.5 02/06/2014 1958   ALBUMIN 3.9 02/06/2014 1958   AST 27 08/29/2014   AST 31 02/06/2014 1958   ALT 23 08/29/2014   ALT 31 02/06/2014 1958   ALKPHOS 70 08/29/2014   ALKPHOS 76 02/06/2014 1958   BILITOT 0.5 02/06/2014 1958   GFRNONAA 44 (L) 02/06/2014 1958   GFRAA 52 (L) 02/06/2014 1958    Assessment and Plan :  1. Urinary frequency  - POCT urinalysis dipstick  2. Neuropathy (HCC)  - gabapentin (NEURONTIN) 600 MG tablet; Take 1 tablet (600 mg total) by mouth 3  (three) times daily.  Dispense: 90 tablet; Refill: 12 - traMADol (ULTRAM) 50 MG tablet; Take 1 tablet (50 mg total) by mouth every 6 (six) hours as needed.  Dispense: 100 tablet; Refill: 1  3. Type 2 diabetes mellitus with complication, without long-term current use of insulin (HCC)  - Hemoglobin A1c  4. Essential hypertension  - CBC with Differential/Platelet - TSH  5. Cystitis  - nitrofurantoin (MACRODANTIN) 100 MG capsule; Take 1 capsule (100 mg total) by mouth 2 (two) times daily.  Dispense: 10 capsule; Refill: 0  6. Unsteady gait Multifactorial. Certainly chronic spinal disease-DDD multiple spinal surgeries have impacted this - Ambulatory referral to Physical Therapy  7. Chronic pain  - Ambulatory referral to Physical Therapy - traMADol (ULTRAM) 50 MG tablet; Take 1 tablet (50 mg total) by mouth every 6 (six) hours as needed.  Dispense: 100 tablet; Refill: 1  8. Hypertriglyceridemia  - Lipid Panel With LDL/HDL Ratio - Comprehensive metabolic panel  I have done the exam and reviewed the above chart and it is accurate to the best of my knowledge.  HPI, Exam, and A&P Transcribed under the direction and in the presence of Armenta Erskin L. Wendelyn Breslow, MD  Electronically Signed: Dimas Chyle, CMA  Julieanne Manson MD [facility] 02/26/2016 4:04 PM

## 2016-02-28 LAB — COMPREHENSIVE METABOLIC PANEL
ALT: 22 IU/L (ref 0–32)
AST: 27 IU/L (ref 0–40)
Albumin/Globulin Ratio: 1.5 (ref 1.2–2.2)
Albumin: 4.1 g/dL (ref 3.5–4.7)
Alkaline Phosphatase: 69 IU/L (ref 39–117)
BUN/Creatinine Ratio: 16 (ref 12–28)
BUN: 19 mg/dL (ref 8–27)
Bilirubin Total: 0.7 mg/dL (ref 0.0–1.2)
CALCIUM: 9.7 mg/dL (ref 8.7–10.3)
CO2: 26 mmol/L (ref 18–29)
CREATININE: 1.18 mg/dL — AB (ref 0.57–1.00)
Chloride: 101 mmol/L (ref 96–106)
GFR calc Af Amer: 48 mL/min/{1.73_m2} — ABNORMAL LOW (ref >59)
GFR, EST NON AFRICAN AMERICAN: 41 mL/min/{1.73_m2} — AB (ref >59)
Globulin, Total: 2.7 g/dL (ref 1.5–4.5)
Glucose: 174 mg/dL — ABNORMAL HIGH (ref 65–99)
Potassium: 5.2 mmol/L (ref 3.5–5.2)
Sodium: 143 mmol/L (ref 134–144)
TOTAL PROTEIN: 6.8 g/dL (ref 6.0–8.5)

## 2016-02-28 LAB — HEMOGLOBIN A1C
ESTIMATED AVERAGE GLUCOSE: 174 mg/dL
HEMOGLOBIN A1C: 7.7 % — AB (ref 4.8–5.6)

## 2016-02-28 LAB — CBC WITH DIFFERENTIAL/PLATELET
BASOS ABS: 0.1 10*3/uL (ref 0.0–0.2)
Basos: 1 %
EOS (ABSOLUTE): 0.1 10*3/uL (ref 0.0–0.4)
Eos: 2 %
Hematocrit: 31.4 % — ABNORMAL LOW (ref 34.0–46.6)
Hemoglobin: 10.5 g/dL — ABNORMAL LOW (ref 11.1–15.9)
IMMATURE GRANS (ABS): 0 10*3/uL (ref 0.0–0.1)
Immature Granulocytes: 0 %
LYMPHS ABS: 3.7 10*3/uL — AB (ref 0.7–3.1)
LYMPHS: 49 %
MCH: 38 pg — AB (ref 26.6–33.0)
MCHC: 33.4 g/dL (ref 31.5–35.7)
MCV: 114 fL — ABNORMAL HIGH (ref 79–97)
MONOCYTES: 10 %
Monocytes Absolute: 0.8 10*3/uL (ref 0.1–0.9)
NEUTROS ABS: 2.9 10*3/uL (ref 1.4–7.0)
Neutrophils: 38 %
PLATELETS: 288 10*3/uL (ref 150–379)
RBC: 2.76 x10E6/uL — AB (ref 3.77–5.28)
RDW: 17.4 % — ABNORMAL HIGH (ref 12.3–15.4)
WBC: 7.6 10*3/uL (ref 3.4–10.8)

## 2016-02-28 LAB — TSH: TSH: 2.45 u[IU]/mL (ref 0.450–4.500)

## 2016-02-28 LAB — LIPID PANEL WITH LDL/HDL RATIO
Cholesterol, Total: 142 mg/dL (ref 100–199)
HDL: 64 mg/dL (ref >39)
LDL Calculated: 54 mg/dL (ref 0–99)
LDL/HDL RATIO: 0.8 ratio (ref 0.0–3.2)
Triglycerides: 120 mg/dL (ref 0–149)
VLDL Cholesterol Cal: 24 mg/dL (ref 5–40)

## 2016-02-28 LAB — URINE CULTURE

## 2016-02-28 LAB — PLEASE NOTE

## 2016-03-01 ENCOUNTER — Telehealth: Payer: Self-pay

## 2016-03-01 NOTE — Telephone Encounter (Signed)
Left message to call back. B12 was added to specimen.

## 2016-03-01 NOTE — Telephone Encounter (Signed)
-----   Message from Maple Hudsonichard L Gilbert Jr., MD sent at 02/28/2016  7:05 AM EDT ----- Labs stable. Please see if check of B12 would be allowed for neuropathy and /or macrocytic anemia.

## 2016-03-02 NOTE — Telephone Encounter (Signed)
Send to nurse box

## 2016-03-02 NOTE — Telephone Encounter (Signed)
Send to nurse box 

## 2016-03-07 LAB — VITAMIN B12: VITAMIN B 12: 707 pg/mL (ref 211–946)

## 2016-03-07 LAB — SPECIMEN STATUS REPORT

## 2016-03-08 ENCOUNTER — Telehealth: Payer: Self-pay | Admitting: Family Medicine

## 2016-03-08 NOTE — Telephone Encounter (Signed)
Noted-aa 

## 2016-03-08 NOTE — Telephone Encounter (Signed)
Maybe see Ricki Rodriguezdriana  and then she can come to me with any questions

## 2016-03-08 NOTE — Telephone Encounter (Signed)
Need appointment?-aa

## 2016-03-08 NOTE — Telephone Encounter (Signed)
Pt call saying she has been having really bad pain on her right buttocks down the back of her leg.  She says she is taking all the pain medication she can right now.  Please advise.  [phone]  Thank sTeri

## 2016-03-08 NOTE — Telephone Encounter (Signed)
Pt wanted to let Dr. Sullivan LoneGilbert know that she went ahead and scheduled an appt with Ortho. Pt stated she will call back if she needs referral. Thanks TNP

## 2016-03-23 ENCOUNTER — Ambulatory Visit (INDEPENDENT_AMBULATORY_CARE_PROVIDER_SITE_OTHER): Payer: Medicare Other | Admitting: Family Medicine

## 2016-03-23 ENCOUNTER — Encounter: Payer: Self-pay | Admitting: Family Medicine

## 2016-03-23 VITALS — BP 108/62 | HR 80 | Temp 98.2°F | Resp 16 | Wt 162.0 lb

## 2016-03-23 DIAGNOSIS — F329 Major depressive disorder, single episode, unspecified: Secondary | ICD-10-CM | POA: Diagnosis not present

## 2016-03-23 DIAGNOSIS — M791 Myalgia, unspecified site: Secondary | ICD-10-CM

## 2016-03-23 DIAGNOSIS — H539 Unspecified visual disturbance: Secondary | ICD-10-CM | POA: Diagnosis not present

## 2016-03-23 DIAGNOSIS — F32A Depression, unspecified: Secondary | ICD-10-CM

## 2016-03-23 LAB — GLUCOSE, POCT (MANUAL RESULT ENTRY): POC Glucose: 144 mg/dl — AB (ref 70–99)

## 2016-03-23 MED ORDER — DULOXETINE HCL 20 MG PO CPEP: 20.0000 mg | ORAL_CAPSULE | Freq: Every day | ORAL | 3 refills | Status: DC

## 2016-03-23 MED ORDER — HYDROCODONE-ACETAMINOPHEN 5-325 MG PO TABS: 0.5000 | ORAL_TABLET | ORAL | 0 refills | Status: DC | PRN

## 2016-03-23 NOTE — Progress Notes (Signed)
Patient: Renee Carpenter Female    DOB: [birth year]   82 y.o.   MRN: [id number] Visit Date: 03/23/2016  Today's Provider: Megan Mansichard Vernis Eid Jr, MD   Chief Complaint  Patient presents with  . Back Pain   Subjective:    Back Pain  This is a recurrent problem. The problem occurs intermittently. The problem has been gradually worsening since onset. The pain is present in the gluteal. The quality of the pain is described as stabbing. The pain radiates to the left thigh. The pain is at a severity of 8/10. The symptoms are aggravated by standing and position. Associated symptoms include leg pain, numbness (neuropathy) and tingling. Pertinent negatives include no abdominal pain, chest pain, dysuria, fever, headaches, pelvic pain or weight loss. She has tried analgesics (Tramadol) for the symptoms. The treatment provided mild relief.  Pt saw Dr. Marney DoctorMcGhee (ortho) for this on 03/09/2016. Pt was started on 10 day prednisone taper for flare of arthritic pain affecting the sciatic nerve, without relief. If prednisone did not improve sx, ortho wanted to refer pt to Dr. Yves Dillhasnis or neurosurgery.    Allergies  Allergen Reactions  . Acetaminophen Itching and Other (See Comments)    Stomach upset  . Aspirin Itching and Other (See Comments)    Stomach pain  . Other Other (See Comments)    Novocaine, strange feeling  . Sulfa Antibiotics Other (See Comments)  . Caffeine Palpitations  . Ginger Palpitations  . Ibuprofen Rash     Current Outpatient Prescriptions:  .  aspirin EC 81 MG tablet, Take by mouth., Disp: , Rfl:  .  Calcium-Magnesium-Vitamin D (CALCIUM 500 PO), Take by mouth., Disp: , Rfl:  .  esomeprazole (NEXIUM) 40 MG capsule, Take by mouth., Disp: , Rfl:  .  estrogens, conjugated, (PREMARIN) 0.3 MG tablet, TAKE 1 TABLET BY MOUTH EVERY DAY FOR 21 DAYS THEN DO NOT TAKE FOR 7 DAYS, Disp: 30 tablet, Rfl: 11 .  gabapentin (NEURONTIN) 600 MG tablet, Take 1 tablet (600 mg total) by mouth 3  (three) times daily., Disp: 90 tablet, Rfl: 12 .  losartan (COZAAR) 50 MG tablet, Take 1 tablet (50 mg total) by mouth daily., Disp: 90 tablet, Rfl: 3 .  MAGNESIUM-OXIDE 400 (241.3 Mg) MG tablet, TAKE 1 TABLET BY MOUTH TWICE DAILY, Disp: 60 tablet, Rfl: 12 .  MULTIPLE VITAMIN PO, Take by mouth., Disp: , Rfl:  .  rOPINIRole (REQUIP) 1 MG tablet, Take 1 mg by mouth. 1/2 tablet four times daily., Disp: , Rfl:  .  traMADol (ULTRAM) 50 MG tablet, Take 1 tablet (50 mg total) by mouth every 6 (six) hours as needed., Disp: 100 tablet, Rfl: 1 .  Coenzyme Q10 (CO Q10) 200 MG CAPS, Take by mouth., Disp: , Rfl:  .  fish oil-omega-3 fatty acids 1000 MG capsule, Take 1 g by mouth 2 (two) times daily.  , Disp: , Rfl:  .  hydroxypropyl methylcellulose (ISOPTO TEARS) 2.5 % ophthalmic solution, Place 1 drop into both eyes 3 (three) times daily as needed. Dry eyes , Disp: , Rfl:  .  hydrOXYzine (ATARAX/VISTARIL) 10 MG tablet, Take by mouth., Disp: , Rfl:  .  lidocaine (LIDODERM) 5 %, Place 1 patch onto the skin daily. Remove & Discard patch within 12 hours or as directed by MD (Patient not taking: Reported on 03/23/2016), Disp: 30 patch, Rfl: 2  Review of Systems  Constitutional: Negative for fever and weight loss.  Cardiovascular: Negative for chest pain.  Gastrointestinal: Negative for abdominal pain.  Genitourinary: Negative for dysuria and pelvic pain.  Musculoskeletal: Positive for arthralgias, back pain and myalgias.  Neurological: Positive for tingling and numbness (neuropathy). Negative for headaches.  Psychiatric/Behavioral: Negative.     Social History  Substance Use Topics  . Smoking status: Never Smoker  . Smokeless tobacco: Never Used  . Alcohol use No   Objective:   BP 108/62 (BP Location: Left Arm, Patient Position: Sitting, Cuff Size: Large)   Pulse 80   Temp 98.2 F (36.8 C) (Oral)   Resp 16   Wt 162 lb (73.5 kg)   BMI 26.15 kg/m   Physical Exam  Constitutional: She is oriented  to person, place, and time. She appears well-developed and well-nourished.  HENT:  Head: Normocephalic and atraumatic.  Eyes: Conjunctivae are normal. No scleral icterus.  Neck: No thyromegaly present.  Cardiovascular: Normal rate and regular rhythm.   Pulmonary/Chest: Effort normal and breath sounds normal.  Abdominal: Soft. Bowel sounds are normal.  Musculoskeletal: She exhibits no edema.  Pt describing pain as coming from L5 vertebrae . She has marked increased thoracic kyphosis which is a chronic issue  Lymphadenopathy:    She has no cervical adenopathy.  Neurological: She is alert and oriented to person, place, and time.  Skin: Skin is warm and dry.  Psychiatric: She has a normal mood and affect. Her behavior is normal. Judgment and thought content normal.        Assessment & Plan:     1. Myalgia Worsening. Check labs. Start Norco. - CBC with Differential/Platelet - ANA - Sedimentation rate - Renal function panel - HYDROcodone-acetaminophen (NORCO/VICODIN) 5-325 MG tablet; Take 0.5-1 tablets by mouth every 4 (four) hours as needed for moderate pain.  Dispense: 100 tablet; Refill: 0  2. Depression, unspecified depression type Worsening. Start Cymbalta as below. FU pending results.More than 50% of this 25 minute visit is spent in counseling regarding these issues. - DULoxetine (CYMBALTA) 20 MG capsule; Take 1 capsule (20 mg total) by mouth daily.  Dispense: 30 capsule; Refill: 3  3. Visual disturbance Glucose WNL at 144. - POCT glucose (manual entry)  5.Chronic back pain with sciatica I'm not sure patient is a repeat surgical candidate at age 50. Would refer to physiatry or pain clinic. 6. Chronic anxiety    Patient seen and examined by Julieanne Manson, MD, and note scribed by Allene Dillon, CMA.  I have done the exam and reviewed the above chart and it is accurate to the best of my knowledge.  Seanpatrick Maisano Wendelyn Breslow, MD  [facility]

## 2016-03-24 LAB — RENAL FUNCTION PANEL
ALBUMIN: 4 g/dL (ref 3.5–4.7)
BUN/Creatinine Ratio: 21 (ref 12–28)
BUN: 29 mg/dL — AB (ref 8–27)
CHLORIDE: 95 mmol/L — AB (ref 96–106)
CO2: 31 mmol/L — AB (ref 18–29)
Calcium: 10.3 mg/dL (ref 8.7–10.3)
Creatinine, Ser: 1.41 mg/dL — ABNORMAL HIGH (ref 0.57–1.00)
GFR calc Af Amer: 38 mL/min/{1.73_m2} — ABNORMAL LOW (ref >59)
GFR, EST NON AFRICAN AMERICAN: 33 mL/min/{1.73_m2} — AB (ref >59)
GLUCOSE: 157 mg/dL — AB (ref 65–99)
PHOSPHORUS: 3.2 mg/dL (ref 2.5–4.5)
POTASSIUM: 5.5 mmol/L — AB (ref 3.5–5.2)
Sodium: 139 mmol/L (ref 134–144)

## 2016-03-24 LAB — CBC WITH DIFFERENTIAL/PLATELET
BASOS: 0 %
Basophils Absolute: 0 10*3/uL (ref 0.0–0.2)
EOS (ABSOLUTE): 0.1 10*3/uL (ref 0.0–0.4)
EOS: 1 %
HEMATOCRIT: 31.9 % — AB (ref 34.0–46.6)
Hemoglobin: 11 g/dL — ABNORMAL LOW (ref 11.1–15.9)
IMMATURE GRANULOCYTES: 1 %
Immature Grans (Abs): 0.1 10*3/uL (ref 0.0–0.1)
Lymphocytes Absolute: 4.3 10*3/uL — ABNORMAL HIGH (ref 0.7–3.1)
Lymphs: 39 %
MCH: 38.6 pg — AB (ref 26.6–33.0)
MCHC: 34.5 g/dL (ref 31.5–35.7)
MCV: 112 fL — AB (ref 79–97)
MONOS ABS: 1.2 10*3/uL — AB (ref 0.1–0.9)
Monocytes: 11 %
NEUTROS ABS: 5.4 10*3/uL (ref 1.4–7.0)
NEUTROS PCT: 48 %
Platelets: 286 10*3/uL (ref 150–379)
RBC: 2.85 x10E6/uL — ABNORMAL LOW (ref 3.77–5.28)
RDW: 17.9 % — AB (ref 12.3–15.4)
WBC: 11.2 10*3/uL — ABNORMAL HIGH (ref 3.4–10.8)

## 2016-03-24 LAB — SEDIMENTATION RATE: Sed Rate: 10 mm/hr (ref 0–40)

## 2016-03-24 LAB — ANA: Anti Nuclear Antibody(ANA): POSITIVE — AB

## 2016-03-29 ENCOUNTER — Telehealth: Payer: Self-pay | Admitting: Family Medicine

## 2016-03-29 DIAGNOSIS — M5137 Other intervertebral disc degeneration, lumbosacral region: Secondary | ICD-10-CM

## 2016-03-29 DIAGNOSIS — M5430 Sciatica, unspecified side: Secondary | ICD-10-CM

## 2016-03-29 NOTE — Telephone Encounter (Signed)
Pt called wanting to increase her hydrocodone.   She said she is taking 1 tablet 4 times a daily and she states she is still having pain in her lower back and legs.  Her call back is [phone]  Thank sTeri

## 2016-03-29 NOTE — Telephone Encounter (Signed)
Please review-aa 

## 2016-03-30 ENCOUNTER — Other Ambulatory Visit: Payer: Self-pay

## 2016-03-30 DIAGNOSIS — M791 Myalgia, unspecified site: Secondary | ICD-10-CM

## 2016-03-30 DIAGNOSIS — M5430 Sciatica, unspecified side: Secondary | ICD-10-CM

## 2016-03-30 NOTE — Telephone Encounter (Signed)
LOV with Dr Yves Dillhasnis in August that we received and she also saw orthopedic and in the note on 10/17 they advised patient to follow up with Dr Yves Dillhasnis or neurosurgeon (its in care everywhere section)

## 2016-03-30 NOTE — Telephone Encounter (Signed)
Advised patient per Dr Sullivan LoneGilbert he is not comfortable with increasing Norco, patient can contact Dr Yves Dillhasnis to check for options with him if necessary, patient understood.-aa

## 2016-03-30 NOTE — Telephone Encounter (Signed)
Renee Carpenter

## 2016-03-30 NOTE — Telephone Encounter (Signed)
Does she have referral to Pain clinic/physiatry?

## 2016-04-02 NOTE — Telephone Encounter (Signed)
Have reviewed pt's charts, both orthopedics and physiatry. Patient had prednisone taper earlier this month with some success. Started on norco by Dr. Sullivan LoneGilbert recently. In absence of alarm symptoms (fever, recent injury, IVDU, active or history of cancer), probably very little to be done if patient were to come into office. Would not want to increase norco. Seems chronic (hx of lumbar fusion and five neck surgeries). Patient has not tried injections yet and per Dr. Yves Dillhasnis and ortho notes, has been instructed to follow up with physiatry for future pain management.

## 2016-04-02 NOTE — Telephone Encounter (Signed)
Do you think patient should come in or is there something else we can do? This is chronic pain that has been getting worse I believe. Before I call her I wanted someone to look into this. Thank you-aa

## 2016-04-02 NOTE — Telephone Encounter (Signed)
Pt would like to speak with Ana b/c her pain is a 7 and getting worse and she wanted to know what the next step was or if she should come in for OV. I advised pt Dr. Sullivan LoneGilbert is out of the office today and I could schedule her with PA. Pt stated that she would rather speak with Ana first. Thanks TNP

## 2016-04-02 NOTE — Telephone Encounter (Signed)
Spoke with patient. Her pain is getting worse and Hydrocodone is not relieving her symptoms. She is also taking Gabapentin 600 mg 1 tablet three times daily. She is not taking Tramadol that is on her list, she did not think she should take it all. Her pain is mainly from her tailbone radiating down to the leg and her feet hurt so abd at times especially at night. She saw Dr Yves Dillhasnis about 2 or 3 times she said and she does not think she can do the spinal injections. Should she go back to her neurosurgeon? What should she do? She is ok to wait for Dr Sullivan LoneGilbert to respond on Monday-aa

## 2016-04-02 NOTE — Telephone Encounter (Signed)
Per orthopedic note on 03/09/2016  Plan:  1. Treatment options were discussed today with the patient. 2. I informed the patient that do not see any acute process going on her back, this may be a flare of arthritic pain affecting the sciatic nerve. She was given a 10 day prednisone taper. 3. She was instructed to call after she finishes the prednisone, if continued sharp pain in the right leg, will have her follow-up with Dr. Yves Dillhasnis for the neurosurgeon that did her original surgery. 4. The patient will follow-up as needed if persistent symptoms. They can call the clinic they have any questions, new symptoms develop or symptoms worsen.  Patient should probably contact her neurosurgeon if these symptoms have reappeared and flared.

## 2016-04-03 LAB — ANA W/REFLEX: Anti Nuclear Antibody(ANA): POSITIVE — AB

## 2016-04-03 LAB — ENA+DNA/DS+SJORGEN'S
DSDNA AB: 100 [IU]/mL — AB (ref 0–9)
ENA SM Ab Ser-aCnc: <0.2 AI (ref 0.0–0.9)
ENA SSA (RO) Ab: <0.2 AI (ref 0.0–0.9)
ENA SSB (LA) Ab: <0.2 AI (ref 0.0–0.9)

## 2016-04-05 ENCOUNTER — Ambulatory Visit: Payer: Medicare Other | Admitting: Physical Therapy

## 2016-04-05 NOTE — Telephone Encounter (Signed)
Dr Sullivan Lonegilbert please review my message where I spoke to her-aa

## 2016-04-05 NOTE — Telephone Encounter (Signed)
Patient advised. Please refer to Dr. Wyline MoodBranch again, [facility] Patient has seen them last per our record 05/2015.-aa

## 2016-04-05 NOTE — Telephone Encounter (Signed)
Please refer-thank you.

## 2016-04-05 NOTE — Telephone Encounter (Signed)
Neurosurgeon if not improving.

## 2016-04-06 ENCOUNTER — Telehealth: Payer: Self-pay

## 2016-04-06 DIAGNOSIS — R768 Other specified abnormal immunological findings in serum: Secondary | ICD-10-CM

## 2016-04-06 NOTE — Telephone Encounter (Signed)
-----   Message from Maple Hudsonichard L Gilbert Jr., MD sent at 04/04/2016  6:06 AM EDT ----- Refer to rheumatology for ANA in chronic pain. SLE is the reflex result. We'll defer to their expertise of rheumatology.

## 2016-04-07 ENCOUNTER — Encounter: Payer: Self-pay | Admitting: Physical Therapy

## 2016-04-07 ENCOUNTER — Ambulatory Visit: Payer: Medicare Other | Attending: Family Medicine | Admitting: Physical Therapy

## 2016-04-07 DIAGNOSIS — M5442 Lumbago with sciatica, left side: Secondary | ICD-10-CM | POA: Insufficient documentation

## 2016-04-07 DIAGNOSIS — G8929 Other chronic pain: Secondary | ICD-10-CM | POA: Insufficient documentation

## 2016-04-07 DIAGNOSIS — R2681 Unsteadiness on feet: Secondary | ICD-10-CM | POA: Insufficient documentation

## 2016-04-07 DIAGNOSIS — R262 Difficulty in walking, not elsewhere classified: Secondary | ICD-10-CM | POA: Diagnosis present

## 2016-04-07 DIAGNOSIS — M5441 Lumbago with sciatica, right side: Secondary | ICD-10-CM | POA: Insufficient documentation

## 2016-04-07 NOTE — Patient Instructions (Signed)
[email or web address]  Seated iso clamshells Seated iso hip add with ball/pillow scap retraction to promote more upright posture

## 2016-04-07 NOTE — Therapy (Signed)
[place] [address], [place], [address] Phone: [phone]   Fax:  [phone]  Physical Therapy Evaluation  Patient Details  Name: Renee Carpenter MRN: [id number] Date of Birth: [birth year] Referring Provider: Sullivan Lone  Encounter Date: 04/07/2016      PT End of Session - 04/07/16 1606    Visit Number 1   Number of Visits 13   Date for PT Re-Evaluation 05/05/16   Authorization Type 1/10   PT Start Time 1405   PT Stop Time 1500   PT Time Calculation (min) 55 min   Activity Tolerance Patient limited by pain   Behavior During Therapy [facility] for tasks assessed/performed      Past Medical History:  Diagnosis Date  . Anemia    WAS ANEMIC  . Arthritis   . Blood transfusion    HAD 1 SEVERAL YRS AGO...3-4 YR  . Chronic kidney disease    SOME DECREASE IN KIDNEY FUNCTION  . Diabetes mellitus without complication (HCC)   . Hypertension   . Normal cardiac stress test    REQUESTING COPY FROM [facility]  . Normal echocardiogram    REQUESTING THAT RESULT -- DR Park Breed   . PONV (postoperative nausea and vomiting)   . Shortness of breath   . Spondylolysis 1998    Past Surgical History:  Procedure Laterality Date  . ABDOMINAL HYSTERECTOMY    . BACK SURGERY     2 CERVICAL SURGERIES ALSO  . CATARACTS     RIGHT EYE CATARACT  . CERVICAL DISCECTOMY  08/1995 and 2011   C4-,C5-6, C6-7 anterior cervical; Dr. Danielle Dess fusion and plating  . LAMINECTOMY     decompressive; Dr. Polo Riley in [place]  . LUMBAR LAMINECTOMY/DECOMPRESSION MICRODISCECTOMY  04/14/2011   Procedure: LUMBAR LAMINECTOMY/DECOMPRESSION MICRODISCECTOMY;  Surgeon: Cristi Loron;  Location: [facility];  Service: Neurosurgery;  Laterality: N/A;  Lumbar Three and Lumbar Four Laminectomy  . TONSILLECTOMY AND ADENOIDECTOMY  1939    There were no vitals filed for this visit.       Subjective Assessment - 04/07/16 1553    Subjective LBP and  balance deficits   Pertinent History Pt reports she has been referred to neurosurgeon and a rheumatologist for her LBP because "they don't know what's going on." She has pain across her back, along the back of her legs and at the top of her feet. This pain has been going on for 1 month. She is taking hydrocodone for pain which helps sometimes. She also reports that she has been having some balance and difficulty for a long time. She states "right now my balance is really bad." She has been ambulating with a rollator for a few years now. She had a fall when she tripped over a telephone cord about a week ago; she states she was able to get up by herself and she states that she did not hurt herself. She denies b/b changes. She reports that her BLE do feel weaker since her LBP worsened 1 month ago; she denies n/t down BLE and reports it as just pain/weakness. She denies any relieving factors and reports that sitting, a lot of activity. She reports her sleep is affected due to bil hip pain when she sleeps on her side and has increased back pain when she sleeps on her back. 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   Limitations Sitting;Lifting;Standing;Walking;House hold activities   How long can you sit comfortably? 10 mins  How long can you stand comfortably? no long   How long can you walk comfortably? 8 mins   Patient Stated Goals to walk better   Currently in Pain? Yes   Pain Score 4    Pain Location Back   Pain Orientation Right;Left;Lower   Pain Descriptors / Indicators Constant   Aggravating Factors  sitting, too much activity   Pain Relieving Factors medication intermittently            [facility] PT Assessment - 04/07/16 0001      Assessment   Medical Diagnosis Unsteady gait; chronic pain   Referring Provider Sullivan Lone   Onset Date/Surgical Date 03/07/16   Next MD Visit next week   Prior Therapy yes; following surgery     Precautions   Precautions Fall     Balance Screen   Has the  patient fallen in the past 6 months Yes   How many times? 1   Has the patient had a decrease in activity level because of a fear of falling?  Yes   Is the patient reluctant to leave their home because of a fear of falling?  Yes     Home Environment   Living Environment Assisted living  [facility] Equipment Walker - 4 wheels;Grab bars - tub/shower     Prior Function   Level of Independence Requires assistive device for independence   Leisure directing music     Cognition   Overall Cognitive Status Within Functional Limits for tasks assessed     Standardized Balance Assessment   Standardized Balance Assessment Berg Balance Test;10 meter walk test   10 Meter Walk 1.90m/s       SENSATION  L4 diminished completely bil L5 diminished slightly bil S1/2 diminished slightly bil  AROM/PROM Bil hip IR PROM decreased by 75% of normal, painful on right Bil hip extension AROM limited due to weakness  All other BLE WNL   STRENGTH (on scale of 0-5/5)  Left Right  Hip flexion 4+ 4+  Hip extension 3 3  Hip IR/ER    Hip abduction 4 4  Hip adduction    Knee flexion 4 4  Knee extension 5 5  Ankle DF 3+ 4+  Ankle PF              PALPATION Increased soft tissue restrictions of bil lumbar erector spinae, bil glutes; increased TTP of R lumbar erector spinae and R glutes  POSTURE Increased hip flexion, thoracic kyphosis, forward head posture, decreased lumbar lordosis while sitting and standing  GAIT Ambulates with rollator and walks with crouched gait/increased trunk flexion (pt has bil forearms leaning on handles of rollator)  : 1.15m/s with rollator  BALANCE Deficits noted on gross assessment as pt requires BUE support for transfers and with ambulation; Unable to perform objective balance measure as pt had increased pain and did not wish to continue objective testing    Moist heat to lower back x 12 mins at end of session for pain control; pt reported decreased  pain        PT Education - 04/07/16 1606    Education provided Yes   Education Details exam findings, HEP   Person(s) Educated Patient   Methods Explanation   Comprehension Verbalized understanding             PT Long Term Goals - 04/07/16 1615      PT LONG TERM GOAL #1   Title Pt will be independent with HEP to maximize  recovery of function and decrease risk of reinjury.   Time 6   Period Weeks   Status New     PT LONG TERM GOAL #2   Title Pt will demonstrate hip PROM to WNL with no pain to demonstrate improved overall function.   Baseline 11/1: bil hip IR lacking approximately 50% of normal ROM, painful on R hip   Time 6   Period Weeks   Status New     PT LONG TERM GOAL #3   Title Pt will demonstrate improved upright posture with gait 50% of the time or more to improved bil hip flexor length, decrease stress on lower back, and improve overall function.   Time 6   Period Weeks   Status New               Plan - 04/07/16 1607    Clinical Impression Statement Pt is pleasant 82 YO F who presents to therapy with c/o chronic LBP with radicular symptoms down BLE, and difficulty with balancing and gait. Pt has decreased bil hip IR, weak glutes, increased bil hip flexor tightness, and increased pain. Pt ambulates with crouched gait and with a rollator. Pt had increased back pain following MMT and 10MWT objective testing and did not wish to continue further objective testing (BERG, DGI); however, per gross assessment, pt has deficits in balance as well. Moist heat applied to pt's lower back while sitting which she reported decreased her pain. Pt needs skilled PT intervention to maximize overall function.   Rehab Potential Poor   Clinical Impairments Affecting Rehab Potential co-morbidities, chronicity of issue, potential difficulty with HEP compliance   PT Frequency 2x / week   PT Duration 6 weeks   PT Treatment/Interventions ADLs/Self Care Home  Management;Cryotherapy;Electrical Stimulation;Moist Heat;Gait training;Stair training;Functional mobility training;Therapeutic activities;Therapeutic exercise;Balance training;Neuromuscular re-education;Patient/family education;Manual techniques;Passive range of motion   PT Next Visit Plan BLE strengthening, hip ROM, postural strength   PT Home Exercise Plan 11/1: seated iso clamshells, seated iso hip add, scap retraction   Consulted and Agree with Plan of Care Patient      Patient will benefit from skilled therapeutic intervention in order to improve the following deficits and impairments:  Abnormal gait, Decreased activity tolerance, Decreased balance, Decreased mobility, Decreased range of motion, Decreased strength, Difficulty walking, Hypomobility, Increased fascial restricitons, Impaired flexibility, Impaired sensation, Improper body mechanics, Postural dysfunction, Pain  Visit Diagnosis: Chronic bilateral low back pain with left-sided sciatica  Chronic bilateral low back pain with right-sided sciatica  Unsteadiness on feet  Difficulty in walking, not elsewhere classified     Problem List Patient Active Problem List   Diagnosis Date Noted  . ANA positive 04/06/2016  . Right upper quadrant pain 09/11/2015  . Neuropathic pain 04/15/2015  . Cervical pain 01/24/2015  . Clinical depression 01/01/2015  . Back pain, chronic 12/14/2014  . COPD, mild (HCC) 12/14/2014  . Anxiety, generalized 12/14/2014  . Esophageal reflux 12/14/2014  . Diabetes (HCC) 12/14/2014  . Progeria syndrome 12/14/2014  . BP (high blood pressure) 12/14/2014  . Adaptive colitis 12/14/2014  . Cannot sleep 12/14/2014  . Affective disorder, major 12/14/2014  . Chronic anemia 12/14/2014  . Arthritis, degenerative 12/14/2014  . OP (osteoporosis) 12/14/2014  . Restless leg 12/14/2014  . Neuralgia neuritis, sciatic nerve 12/14/2014  . 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 Absolute  anemia 03/03/2012  . Lumbar canal stenosis 02/10/2012  . Degeneration of intervertebral disc of lumbosacral region 11/19/2011  . Neuropathy (HCC) 02/26/2008  .  Essential hypertension 02/26/2008  . Fatigue 02/26/2008  . DYSPNEA 02/26/2008   Jac CanavanBrooke Powell, SPT  Jac CanavanBrooke Powell 04/07/2016, 4:21 PM  [place] [facility] [address] [place], [place], [address] Phone: [phone]   Fax:  [phone]  Name: Phebe CollaBetty P Carpenter MRN: [id number] Date of Birth: [birth year]

## 2016-04-13 ENCOUNTER — Ambulatory Visit: Payer: Medicare Other | Admitting: Physical Therapy

## 2016-04-13 DIAGNOSIS — M5410 Radiculopathy, site unspecified: Secondary | ICD-10-CM | POA: Insufficient documentation

## 2016-04-13 DIAGNOSIS — M545 Low back pain, unspecified: Secondary | ICD-10-CM | POA: Insufficient documentation

## 2016-04-14 ENCOUNTER — Ambulatory Visit: Payer: Medicare Other | Admitting: Physical Therapy

## 2016-04-15 ENCOUNTER — Encounter: Payer: Medicare Other | Admitting: Physical Therapy

## 2016-04-15 ENCOUNTER — Other Ambulatory Visit: Payer: Self-pay | Admitting: Family Medicine

## 2016-04-15 DIAGNOSIS — N1832 Chronic kidney disease, stage 3b: Secondary | ICD-10-CM | POA: Insufficient documentation

## 2016-04-15 DIAGNOSIS — N183 Chronic kidney disease, stage 3 (moderate): Secondary | ICD-10-CM

## 2016-04-15 DIAGNOSIS — M791 Myalgia, unspecified site: Secondary | ICD-10-CM

## 2016-04-15 DIAGNOSIS — R768 Other specified abnormal immunological findings in serum: Secondary | ICD-10-CM | POA: Insufficient documentation

## 2016-04-15 MED ORDER — HYDROCODONE-ACETAMINOPHEN 5-325 MG PO TABS: 0.5000 | ORAL_TABLET | ORAL | 0 refills | Status: DC | PRN

## 2016-04-15 NOTE — Telephone Encounter (Signed)
Discussed information with pt. She will pick up her rx and she has an appt with Dr. Sullivan LoneGilbert about her specialist on 04/20/16.

## 2016-04-15 NOTE — Telephone Encounter (Signed)
Ok for now

## 2016-04-15 NOTE — Telephone Encounter (Signed)
Pt needs refill on her hydrocodone 5-325.  She also would like a nurse to call her back and discuss some appts she has had with the specialist doctors and also talk about the rehab she has been having.  She said she just needs to go over some things with a nurse.  Her call back is [phone]  Thanks Barth Kirkseri

## 2016-04-15 NOTE — Telephone Encounter (Signed)
Please review refill request and then we can discuss with patient other information-aa

## 2016-04-19 ENCOUNTER — Encounter: Payer: Self-pay | Admitting: Physical Therapy

## 2016-04-19 ENCOUNTER — Ambulatory Visit: Payer: Medicare Other | Admitting: Physical Therapy

## 2016-04-19 DIAGNOSIS — R2681 Unsteadiness on feet: Secondary | ICD-10-CM

## 2016-04-19 DIAGNOSIS — M5442 Lumbago with sciatica, left side: Secondary | ICD-10-CM | POA: Diagnosis not present

## 2016-04-19 DIAGNOSIS — M5441 Lumbago with sciatica, right side: Secondary | ICD-10-CM

## 2016-04-19 DIAGNOSIS — R262 Difficulty in walking, not elsewhere classified: Secondary | ICD-10-CM

## 2016-04-19 DIAGNOSIS — G8929 Other chronic pain: Secondary | ICD-10-CM

## 2016-04-19 NOTE — Therapy (Signed)
[place] [facility] MEDICINE [address] [place], [address] Phone: [phone]   Fax:  [phone]  Physical Therapy Treatment  Patient Details  Name: Renee Carpenter MRN: [id number] Date of Birth: Jul 08, 1925 Referring Provider: Sullivan Lone  Encounter Date: 04/19/2016      PT End of Session - 04/19/16 1119    Visit Number 2   Number of Visits 13   Date for PT Re-Evaluation 05/05/16   Authorization Type 2/10   PT Start Time 1117   PT Stop Time 1200   PT Time Calculation (min) 43 min   Activity Tolerance Patient limited by pain   Behavior During Therapy [facility] for tasks assessed/performed      Past Medical History:  Diagnosis Date  . Anemia    WAS ANEMIC  . Arthritis   . Blood transfusion    HAD 1 SEVERAL YRS AGO...3-4 YR  . Chronic kidney disease    SOME DECREASE IN KIDNEY FUNCTION  . Diabetes mellitus without complication (HCC)   . Hypertension   . Normal cardiac stress test    REQUESTING COPY FROM [facility]  . Normal echocardiogram    REQUESTING THAT RESULT -- DR Park Breed   . PONV (postoperative nausea and vomiting)   . Shortness of breath   . Spondylolysis 1998    Past Surgical History:  Procedure Laterality Date  . ABDOMINAL HYSTERECTOMY    . BACK SURGERY     2 CERVICAL SURGERIES ALSO  . CATARACTS     RIGHT EYE CATARACT  . CERVICAL DISCECTOMY  08/1995 and 2011   C4-,C5-6, C6-7 anterior cervical; Dr. Danielle Dess fusion and plating  . LAMINECTOMY     decompressive; Dr. Polo Riley in [place]  . LUMBAR LAMINECTOMY/DECOMPRESSION MICRODISCECTOMY  04/14/2011   Procedure: LUMBAR LAMINECTOMY/DECOMPRESSION MICRODISCECTOMY;  Surgeon: Cristi Loron;  Location: [facility];  Service: Neurosurgery;  Laterality: N/A;  Lumbar Three and Lumbar Four Laminectomy  . TONSILLECTOMY AND ADENOIDECTOMY  1939    There were no vitals filed for this visit.      Subjective Assessment - 04/19/16 1118    Subjective Pt states  "I'm a mess this morning" and reports she's not moving well today.   Pertinent History Pt reports she has been referred to neurosurgeon and a rheumatologist for her LBP because "they don't know what's going on." She has pain across her back, along the back of her legs and at the top of her feet. This pain has been going on for 1 month. She is taking hydrocodone for pain which helps sometimes. She also reports that she has been having some balance and difficulty for a long time. She states "right now my balance is really bad." She has been ambulating with a rollator for a few years now. She had a fall when she tripped over a telephone cord about a week ago; she states she was able to get up by herself and she states that she did not hurt herself. She denies b/b changes. She reports that her BLE do feel weaker since her LBP worsened 1 month ago; she denies n/t down BLE and reports it as just pain/weakness. She denies any relieving factors and reports that sitting, a lot of activity. She reports her sleep is affected due to bil hip pain when she sleeps on her side and has increased back pain when she sleeps on her back. 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   Limitations Sitting;Lifting;Standing;Walking;House hold activities   How  long can you sit comfortably? 10 mins   How long can you stand comfortably? no long   How long can you walk comfortably? 8 mins   Patient Stated Goals to walk better   Currently in Pain? Yes   Pain Score 7    Pain Location Buttocks   Pain Orientation Left   Pain Descriptors / Indicators Radiating;Discomfort   Pain Type Chronic pain   Pain Onset More than a month ago            [facility] - 04/19/16 0001      Standardized Balance Assessment   Standardized Balance Assessment Berg Balance Test     Berg Balance Test   Sit to Stand Able to stand  independently using hands   Standing Unsupported Able to stand 2 minutes with supervision   Sitting with Back  Unsupported but Feet Supported on Floor or Stool Able to sit safely and securely 2 minutes   Stand to Sit Controls descent by using hands   Transfers Able to transfer safely, definite need of hands   Standing Unsupported with Eyes Closed Able to stand 3 seconds   Standing Ubsupported with Feet Together Needs help to attain position but able to stand for 30 seconds with feet together   From Standing, Reach Forward with Outstretched Arm Reaches forward but needs supervision   From Standing Position, Pick up Object from Floor Able to pick up shoe safely and easily   From Standing Position, Turn to Look Behind Over each Shoulder Turn sideways only but maintains balance   Turn 360 Degrees Able to turn 360 degrees safely but slowly   Standing Unsupported, Alternately Place Feet on Step/Stool Needs assistance to keep from falling or unable to try   Standing Unsupported, One Foot in Front Able to take small step independently and hold 30 seconds   Standing on One Leg Unable to try or needs assist to prevent fall   Total Score 30      Therex: BERG for balance assessment: 30/56; pt limited by pain throughout and required frequent rest breaks during test; high risk for falls; pt had most difficulty with EC and SLS  Negative clonus test bilaterally   Bil hip flexor stretch in supine, 3x30 sec each; pt reported feeling "all across my back" when resting with bil feet on table in between stretching (resting in hooklying position); pt reported decreased pain following however  Piriformis stretch, 1x30 sec on L; pt reported feeling a stretch in HS and slight stretch in piriformis/gluteals; 3x30 sec on R, pt reported this stretch as "hitting the spot" on the L side; pt reported improved symptoms following  Ambulating 50' following therex with rollator; cued pt to maintain upright position, which pt was able to do for longer period of time this date; however, by end of 50', pt leaned onto rollator again  stating "I just have to, it's bothering me"  Overall, pt reported decreased symptoms following stretching.       PT Long Term Goals - 04/07/16 1615      PT LONG TERM GOAL #1   Title Pt will be independent with HEP to maximize recovery of function and decrease risk of reinjury.   Time 6   Period Weeks   Status New     PT LONG TERM GOAL #2   Title Pt will demonstrate hip PROM to WNL with no pain to demonstrate improved overall function.   Baseline 11/1: bil hip IR lacking approximately  50% of normal ROM, painful on R hip   Time 6   Period Weeks   Status New     PT LONG TERM GOAL #3   Title Pt will demonstrate improved upright posture with gait 50% of the time or more to improved bil hip flexor length, decrease stress on lower back, and improve overall function.   Time 6   Period Weeks   Status New               Plan - 04/19/16 1215    Clinical Impression Statement Pt presents to therapy with continued lower back and buttock pain, L>R, this date. She scored 30/56 on BBS, indicating high risk for falls; pt required frequent rest breaks throughout balance assessment and was limited by pain with most of the standing balance activities. Pt continued to verbalize that forward flexion relieved her symptoms almost completely so SPT educated pt on how to perform self piriformis stretch, which pt verbalized in session as having the greatest effect. Pt will be dropped to 1x/week due to pain intolerance throughout. Pt verbalized understanding and needs continued skilled PT intervention to maximize overall function and decrease risk of falls.   Rehab Potential Poor   Clinical Impairments Affecting Rehab Potential co-morbidities, chronicity of issue, potential difficulty with HEP compliance   PT Frequency 2x / week   PT Duration 6 weeks   PT Treatment/Interventions ADLs/Self Care Home Management;Cryotherapy;Electrical Stimulation;Moist Heat;Gait training;Stair training;Functional mobility  training;Therapeutic activities;Therapeutic exercise;Balance training;Neuromuscular re-education;Patient/family education;Manual techniques;Passive range of motion   PT Next Visit Plan BLE strengthening, hip ROM, postural strength   PT Home Exercise Plan 11/1: seated iso clamshells, seated iso hip add, scap retraction   Consulted and Agree with Plan of Care Patient      Patient will benefit from skilled therapeutic intervention in order to improve the following deficits and impairments:  Abnormal gait, Decreased activity tolerance, Decreased balance, Decreased mobility, Decreased range of motion, Decreased strength, Difficulty walking, Hypomobility, Increased fascial restricitons, Impaired flexibility, Impaired sensation, Improper body mechanics, Postural dysfunction, Pain  Visit Diagnosis: Chronic bilateral low back pain with left-sided sciatica  Chronic bilateral low back pain with right-sided sciatica  Unsteadiness on feet  Difficulty in walking, not elsewhere classified     Problem List Patient Active Problem List   Diagnosis Date Noted  . ANA positive 04/06/2016  . Right upper quadrant pain 09/11/2015  . Neuropathic pain 04/15/2015  . Cervical pain 01/24/2015  . Clinical depression 01/01/2015  . Back pain, chronic 12/14/2014  . COPD, mild (HCC) 12/14/2014  . Anxiety, generalized 12/14/2014  . Esophageal reflux 12/14/2014  . Diabetes (HCC) 12/14/2014  . Progeria syndrome 12/14/2014  . BP (high blood pressure) 12/14/2014  . Adaptive colitis 12/14/2014  . Cannot sleep 12/14/2014  . Affective disorder, major 12/14/2014  . Chronic anemia 12/14/2014  . Arthritis, degenerative 12/14/2014  . OP (osteoporosis) 12/14/2014  . Restless leg 12/14/2014  . Neuralgia neuritis, sciatic nerve 12/14/2014  . 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 Absolute anemia 03/03/2012  . Lumbar canal stenosis 02/10/2012  . Degeneration of intervertebral disc of  lumbosacral region 11/19/2011  . Neuropathy (HCC) 02/26/2008  . Essential hypertension 02/26/2008  . Fatigue 02/26/2008  . DYSPNEA 02/26/2008   Jac CanavanBrooke Fernado Brigante, SPT  Jac CanavanBrooke Zophia Marrone 04/19/2016, 12:18 PM  [facility] [address] [place], [place], [address] Phone: [phone]   Fax:  [phone]  Name: Phebe CollaBetty P Mette MRN: [id number] Date  of Birth: [birth year]

## 2016-04-20 ENCOUNTER — Ambulatory Visit (INDEPENDENT_AMBULATORY_CARE_PROVIDER_SITE_OTHER): Payer: Medicare Other | Admitting: Family Medicine

## 2016-04-20 ENCOUNTER — Encounter: Payer: Self-pay | Admitting: Family Medicine

## 2016-04-20 VITALS — BP 135/61 | HR 84 | Temp 99.1°F | Ht 66.0 in | Wt 167.0 lb

## 2016-04-20 VITALS — BP 134/64 | HR 84 | Temp 99.1°F | Wt 167.0 lb

## 2016-04-20 DIAGNOSIS — R2681 Unsteadiness on feet: Secondary | ICD-10-CM

## 2016-04-20 DIAGNOSIS — Z0001 Encounter for general adult medical examination with abnormal findings: Secondary | ICD-10-CM | POA: Diagnosis not present

## 2016-04-20 DIAGNOSIS — G8929 Other chronic pain: Secondary | ICD-10-CM

## 2016-04-20 DIAGNOSIS — Z23 Encounter for immunization: Secondary | ICD-10-CM | POA: Diagnosis not present

## 2016-04-20 DIAGNOSIS — F32A Depression, unspecified: Secondary | ICD-10-CM

## 2016-04-20 DIAGNOSIS — F329 Major depressive disorder, single episode, unspecified: Secondary | ICD-10-CM

## 2016-04-20 DIAGNOSIS — Z0000 Encounter for general adult medical examination without abnormal findings: Secondary | ICD-10-CM

## 2016-04-20 DIAGNOSIS — M5430 Sciatica, unspecified side: Secondary | ICD-10-CM

## 2016-04-20 NOTE — Progress Notes (Signed)
Subjective:   Renee Carpenter is a 82 y.o. female who presents for Medicare Annual (Subsequent) preventive examination.  Review of Systems:  N/A  Cardiac Risk Factors include: advanced age (>58men, >15 women);diabetes mellitus;hypertension     Objective:     Vitals: BP 135/61 (BP Location: Right Arm)   Pulse 84   Temp 99.1 F (37.3 C) (Oral)   Ht 5\' 6"  (1.676 m)   Wt 167 lb (75.8 kg)   BMI 26.95 kg/m   Body mass index is 26.95 kg/m.   Tobacco History  Smoking Status  . Never Smoker  Smokeless Tobacco  . Never Used     Counseling given: Not Answered   Past Medical History:  Diagnosis Date  . Anemia    WAS ANEMIC  . Arthritis   . Blood transfusion    HAD 1 SEVERAL YRS AGO...3-4 YR  . Chronic kidney disease    SOME DECREASE IN KIDNEY FUNCTION  . Diabetes mellitus without complication (HCC)   . Hypertension   . Normal cardiac stress test    REQUESTING COPY FROM [facility]  . Normal echocardiogram    REQUESTING THAT RESULT -- DR Park Breed   . PONV (postoperative nausea and vomiting)   . Shortness of breath   . Spondylolysis 1998   Past Surgical History:  Procedure Laterality Date  . ABDOMINAL HYSTERECTOMY    . BACK SURGERY     2 CERVICAL SURGERIES ALSO  . CATARACTS     RIGHT EYE CATARACT  . CERVICAL DISCECTOMY  08/1995 and 2011   C4-,C5-6, C6-7 anterior cervical; Dr. Danielle Dess fusion and plating  . LAMINECTOMY     decompressive; Dr. Polo Riley in [place]  . LUMBAR LAMINECTOMY/DECOMPRESSION MICRODISCECTOMY  04/14/2011   Procedure: LUMBAR LAMINECTOMY/DECOMPRESSION MICRODISCECTOMY;  Surgeon: Cristi Loron;  Location: [facility];  Service: Neurosurgery;  Laterality: N/A;  Lumbar Three and Lumbar Four Laminectomy  . TONSILLECTOMY AND ADENOIDECTOMY  1939   Family History  Problem Relation Age of Onset  . Heart failure Father   . Heart disease Father   . Hypertension Mother   . Diabetes Mother   . Heart disease Mother   . Cancer Sister     breast  .  Cancer Maternal Grandfather   . Cancer Maternal Aunt     x3   History  Sexual Activity  . Sexual activity: No    Outpatient Encounter Prescriptions as of 04/20/2016  Medication Sig  . aspirin EC 81 MG tablet Take by mouth.  . Calcium-Magnesium-Vitamin D (CALCIUM 500 PO) Take by mouth.  . Coenzyme Q10 (CO Q10) 200 MG CAPS Take by mouth.  . DULoxetine (CYMBALTA) 20 MG capsule Take 1 capsule (20 mg total) by mouth daily. (Patient taking differently: Take 20 mg by mouth daily. )  . esomeprazole (NEXIUM) 40 MG capsule Take by mouth.  . estrogens, conjugated, (PREMARIN) 0.3 MG tablet TAKE 1 TABLET BY MOUTH EVERY DAY FOR 21 DAYS THEN DO NOT TAKE FOR 7 DAYS  . fish oil-omega-3 fatty acids 1000 MG capsule Take 1 g by mouth 2 (two) times daily.    Marland Kitchen gabapentin (NEURONTIN) 600 MG tablet Take 1 tablet (600 mg total) by mouth 3 (three) times daily.  Marland Kitchen HYDROcodone-acetaminophen (NORCO/VICODIN) 5-325 MG tablet Take 0.5-1 tablets by mouth every 4 (four) hours as needed for moderate pain.  . hydrOXYzine (ATARAX/VISTARIL) 10 MG tablet Take by mouth.  . lidocaine (LIDODERM) 5 % Place 1 patch onto the skin daily. Remove & Discard patch  within 12 hours or as directed by MD  . losartan (COZAAR) 50 MG tablet Take 1 tablet (50 mg total) by mouth daily.  Marland Kitchen. MAGNESIUM-OXIDE 400 (241.3 Mg) MG tablet TAKE 1 TABLET BY MOUTH TWICE DAILY  . MULTIPLE VITAMIN PO Take by mouth.  Marland Kitchen. rOPINIRole (REQUIP) 1 MG tablet Take 1 mg by mouth. 1/2 tablet four times daily.  . hydroxypropyl methylcellulose (ISOPTO TEARS) 2.5 % ophthalmic solution Place 1 drop into both eyes 3 (three) times daily as needed. Dry eyes   . traMADol (ULTRAM) 50 MG tablet Take 1 tablet (50 mg total) by mouth every 6 (six) hours as needed. (Patient not taking: Reported on 04/20/2016)   No facility-administered encounter medications on file as of 04/20/2016.     Activities of Daily Living In your present state of health, do you have any difficulty  performing the following activities: 04/20/2016 08/27/2015  Hearing? Malvin JohnsY Y  Vision? N N  Difficulty concentrating or making decisions? N N  Walking or climbing stairs? Y Y  Dressing or bathing? N Y  Doing errands, shopping? Malvin JohnsY Y  Preparing Food and eating ? N -  Using the Toilet? N -  In the past six months, have you accidently leaked urine? Y -  Do you have problems with loss of bowel control? N -  Managing your Medications? N -  Managing your Finances? N -  Housekeeping or managing your Housekeeping? Y -  Some recent data might be hidden    Patient Care Team: Maple Hudsonichard L Yacqub Baston Jr., MD as PCP - General (Unknown Physician Specialty) Galen ManilaWilliam Porfilio, MD as Referring Physician (Ophthalmology) Lonell FaceHemang K Shah, MD as Consulting Physician (Neurology) Recardo EvangelistMatthew Troxler, DPM as Attending Physician (Podiatry) Donette Larryharles Branch, MD as Referring Physician (Neurosurgery) Deirdre Eveneravid C Kowalski, MD as Consulting Physician (Dermatology)    Assessment:     Exercise Activities and Dietary recommendations Current Exercise Habits: The patient does not participate in regular exercise at present (in therapy), Exercise limited by: orthopedic condition(s)  Goals    . Increase water intake          Starting 04/20/16, I will increase my water intake to 4 glasses a day.      Fall Risk Fall Risk  04/20/2016 08/27/2015 04/29/2015  Falls in the past year? Yes No No  Number falls in past yr: 1 - -  Injury with Fall? No - -  Risk for fall due to : Impaired balance/gait - -   Depression Screen PHQ 2/9 Scores 04/20/2016 08/27/2015  PHQ - 2 Score 1 0     Cognitive Function     6CIT Screen 04/20/2016  What Year? 0 points  What month? 0 points  What time? 0 points  Count back from 20 0 points  Months in reverse 0 points  Repeat phrase 0 points  Total Score 0    Immunization History  Administered Date(s) Administered  . Influenza, High Dose Seasonal PF 04/29/2015, 04/20/2016  . Pneumococcal Conjugate-13  04/29/2015   Screening Tests Health Maintenance  Topic Date Due  . ZOSTAVAX  04/20/2026 (Originally 12/[birth year]/1987)  . TETANUS/TDAP  04/20/2026 (Originally 12/23/1944)  . PNA vac Low Risk Adult (2 of 2 - PPSV23) 04/28/2016  . HEMOGLOBIN A1C  08/26/2016  . OPHTHALMOLOGY EXAM  09/05/2016  . FOOT EXAM  01/05/2017  . INFLUENZA VACCINE  Completed  . DEXA SCAN  Completed      Plan:  I have personally reviewed and addressed the Medicare Annual Wellness questionnaire and have noted  the following in the patient's chart:  A. Medical and social history B. Use of alcohol, tobacco or illicit drugs  C. Current medications and supplements D. Functional ability and status E.  Nutritional status F.  Physical activity G. Advance directives H. List of other physicians I.  Hospitalizations, surgeries, and ER visits in previous 12 months J.  Vitals K. Screenings such as hearing and vision if needed, cognitive and depression L. Referrals and appointments - none  In addition, I have reviewed and discussed with patient certain preventive protocols, quality metrics, and best practice recommendations. A written personalized care plan for preventive services as well as general preventive health recommendations were provided to patient.  See attached scanned questionnaire for additional information.   Signed,  Hyacinth MeekerMckenzie Markoski, LPN Nurse Health Advisor  MD Recommendations: Follow up on diabetic foot exam.  I have reviewed the information as  put it by [facility] Markoski LPN and was available for any questions or concerns. Julieanne Mansonichard Haydn Cush MD [facility]

## 2016-04-20 NOTE — Progress Notes (Signed)
Subjective:  HPI Pt is here for a 4 week follow up for back pain and depression. She reports that the back pain has improved and she is taking 3-4 Hydrocodone-APAP a day. She reports that it helps. She did see rheumatology for her positive ANA. Dr. Tresa MooreBehalal-Bock. She ordered other lab test and pt has not heard from those yet but would like to see if we can discuss them with her. She also saw her neurosurgeon since her last visit and she ordered X-rays.    She did not start Cymbalta yet because she wanted to make sure with the pain medication and the back pain that she did not take to much medication at one time and did feel it was a good idea to start something new.  She has had 2 neck and 3 LS spine surgeries in the past. Prior to Admission medications   Medication Sig Start Date End Date Taking? Authorizing Provider  aspirin EC 81 MG tablet Take by mouth.    Historical Provider, MD  Calcium-Magnesium-Vitamin D (CALCIUM 500 PO) Take by mouth. 08/09/11   Historical Provider, MD  Coenzyme Q10 (CO Q10) 200 MG CAPS Take by mouth. 08/09/11   Historical Provider, MD  DULoxetine (CYMBALTA) 20 MG capsule Take 1 capsule (20 mg total) by mouth daily. Patient taking differently: Take 20 mg by mouth daily.  03/23/16   Richard Hulen ShoutsL Gilbert Jr., MD  esomeprazole (NEXIUM) 40 MG capsule Take by mouth. 10/30/13   Historical Provider, MD  estrogens, conjugated, (PREMARIN) 0.3 MG tablet TAKE 1 TABLET BY MOUTH EVERY DAY FOR 21 DAYS THEN DO NOT TAKE FOR 7 DAYS 02/02/16   Maple Hudsonichard L Gilbert Jr., MD  fish oil-omega-3 fatty acids 1000 MG capsule Take 1 g by mouth 2 (two) times daily.      Historical Provider, MD  gabapentin (NEURONTIN) 600 MG tablet Take 1 tablet (600 mg total) by mouth 3 (three) times daily. 02/26/16   Richard Hulen ShoutsL Gilbert Jr., MD  HYDROcodone-acetaminophen (NORCO/VICODIN) 5-325 MG tablet Take 0.5-1 tablets by mouth every 4 (four) hours as needed for moderate pain. 04/15/16   Richard Hulen ShoutsL Gilbert Jr., MD    hydroxypropyl methylcellulose (ISOPTO TEARS) 2.5 % ophthalmic solution Place 1 drop into both eyes 3 (three) times daily as needed. Dry eyes     Historical Provider, MD  hydrOXYzine (ATARAX/VISTARIL) 10 MG tablet Take by mouth. 02/13/13   Historical Provider, MD  lidocaine (LIDODERM) 5 % Place 1 patch onto the skin daily. Remove & Discard patch within 12 hours or as directed by MD 08/27/15   Maple Hudsonichard L Gilbert Jr., MD  losartan (COZAAR) 50 MG tablet Take 1 tablet (50 mg total) by mouth daily. 08/27/15   Maple Hudsonichard L Gilbert Jr., MD  MAGNESIUM-OXIDE 400 (241.3 Mg) MG tablet TAKE 1 TABLET BY MOUTH TWICE DAILY 10/12/15   Maple Hudsonichard L Gilbert Jr., MD  MULTIPLE VITAMIN PO Take by mouth. 08/09/11   Historical Provider, MD  rOPINIRole (REQUIP) 1 MG tablet Take 1 mg by mouth. 1/2 tablet four times daily. 10/10/12   Historical Provider, MD  traMADol (ULTRAM) 50 MG tablet Take 1 tablet (50 mg total) by mouth every 6 (six) hours as needed. Patient not taking: Reported on 04/20/2016 02/26/16   Maple Hudsonichard L Gilbert Jr., MD    Patient Active Problem List   Diagnosis Date Noted  . Chronic kidney disease, stage 3, mod decreased GFR 04/15/2016  . Ds DNA antibody positive 04/15/2016  . Radicular pain of both lower extremities  04/13/2016  . Spine pain, lumbar 04/13/2016  . ANA positive 04/06/2016  . Right upper quadrant pain 09/11/2015  . Neuropathic pain 04/15/2015  . Cervical pain 01/24/2015  . Clinical depression 01/01/2015  . Back pain, chronic 12/14/2014  . COPD, mild (HCC) 12/14/2014  . Anxiety, generalized 12/14/2014  . Esophageal reflux 12/14/2014  . Diabetes (HCC) 12/14/2014  . Progeria syndrome 12/14/2014  . BP (high blood pressure) 12/14/2014  . Adaptive colitis 12/14/2014  . Cannot sleep 12/14/2014  . Affective disorder, major 12/14/2014  . Chronic anemia 12/14/2014  . Arthritis, degenerative 12/14/2014  . OP (osteoporosis) 12/14/2014  . Restless leg 12/14/2014  . Neuralgia neuritis, sciatic nerve  12/14/2014  . 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 Absolute anemia 03/03/2012  . Lumbar canal stenosis 02/10/2012  . DDD (degenerative disc disease), lumbosacral 11/19/2011  . Neuropathy (HCC) 02/26/2008  . Essential hypertension 02/26/2008  . Fatigue 02/26/2008  . DYSPNEA 02/26/2008    Past Medical History:  Diagnosis Date  . Anemia    WAS ANEMIC  . Arthritis   . Blood transfusion    HAD 1 SEVERAL YRS AGO...3-4 YR  . Chronic kidney disease    SOME DECREASE IN KIDNEY FUNCTION  . Diabetes mellitus without complication (HCC)   . Hypertension   . Normal cardiac stress test    REQUESTING COPY FROM [facility]  . Normal echocardiogram    REQUESTING THAT RESULT -- DR Park BreedKAHN   . PONV (postoperative nausea and vomiting)   . Shortness of breath   . Spondylolysis 1998    Social History   Social History  . Marital status: Married    Spouse name: N/A  . Number of children: N/A  . Years of education: N/A   Occupational History  . Not on file.   Social History Main Topics  . Smoking status: Never Smoker  . Smokeless tobacco: Never Used  . Alcohol use No  . Drug use: No  . Sexual activity: No   Other Topics Concern  . Not on file   Social History Narrative  . No narrative on file    Allergies  Allergen Reactions  . Acetaminophen Itching and Other (See Comments)    Stomach upset  . Aspirin Itching and Other (See Comments)    Stomach pain  . Other Other (See Comments)    Novocaine, strange feeling  . Sulfa Antibiotics Other (See Comments)  . Caffeine Palpitations  . Ginger Palpitations  . Ibuprofen Rash    Review of Systems  Constitutional: Negative.   HENT: Negative.   Eyes: Negative.   Respiratory: Negative.   Cardiovascular: Negative.   Gastrointestinal: Negative.   Genitourinary: Negative.   Musculoskeletal: Positive for back pain, joint pain and neck pain.  Skin: Negative.   Neurological: Negative.     Endo/Heme/Allergies: Negative.   Psychiatric/Behavioral: Negative.     Immunization History  Administered Date(s) Administered  . Influenza, High Dose Seasonal PF 04/29/2015, 04/20/2016  . Pneumococcal Conjugate-13 04/29/2015    Objective:  BP 134/64 (Patient Position: Sitting)   Pulse 84   Temp 99.1 F (37.3 C) (Oral)   Wt 167 lb (75.8 kg)   BMI 26.95 kg/m   Physical Exam  Constitutional: She is oriented to person, place, and time and well-developed, well-nourished, and in no distress.  Eyes: Conjunctivae and EOM are normal. Pupils are equal, round, and reactive to light.  Neck: Normal range of motion. Neck supple.  Cardiovascular: Normal rate, regular rhythm, normal heart sounds  and intact distal pulses.   Pulmonary/Chest: Effort normal and breath sounds normal.  Musculoskeletal: She exhibits no tenderness.  Increased thoracic kyphosis.  Neurological: She is alert and oriented to person, place, and time. She has normal reflexes. Gait normal. GCS score is 15.  Skin: Skin is warm and dry.  Psychiatric: Mood, memory, affect and judgment normal.    Lab Results  Component Value Date   WBC 11.2 (H) 03/23/2016   HGB 9.8 (A) 09/17/2014   HCT 31.9 (L) 03/23/2016   PLT 286 03/23/2016   GLUCOSE 157 (H) 03/23/2016   CHOL 142 02/27/2016   TRIG 120 02/27/2016   HDL 64 02/27/2016   LDLCALC 54 02/27/2016   TSH 2.450 02/27/2016   HGBA1C 7.7 (H) 02/27/2016    CMP     Component Value Date/Time   NA 139 03/23/2016 1603   NA 139 02/06/2014 1958   K 5.5 (H) 03/23/2016 1603   K 3.9 02/06/2014 1958   CL 95 (L) 03/23/2016 1603   CL 103 02/06/2014 1958   CO2 31 (H) 03/23/2016 1603   CO2 30 02/06/2014 1958   GLUCOSE 157 (H) 03/23/2016 1603   GLUCOSE 123 (H) 02/06/2014 1958   BUN 29 (H) 03/23/2016 1603   BUN 20 (H) 02/06/2014 1958   CREATININE 1.41 (H) 03/23/2016 1603   CREATININE 1.12 02/06/2014 1958   CALCIUM 10.3 03/23/2016 1603   CALCIUM 9.8 02/06/2014 1958   PROT 6.8  02/27/2016 1141   PROT 7.5 02/06/2014 1958   ALBUMIN 4.0 03/23/2016 1603   ALBUMIN 3.9 02/06/2014 1958   AST 27 02/27/2016 1141   AST 31 02/06/2014 1958   ALT 22 02/27/2016 1141   ALT 31 02/06/2014 1958   ALKPHOS 69 02/27/2016 1141   ALKPHOS 76 02/06/2014 1958   BILITOT 0.7 02/27/2016 1141   BILITOT 0.5 02/06/2014 1958   GFRNONAA 33 (L) 03/23/2016 1603   GFRNONAA 44 (L) 02/06/2014 1958   GFRAA 38 (L) 03/23/2016 1603   GFRAA 52 (L) 02/06/2014 1958    Assessment and Plan :  1. Other chronic pain Physical therapy is best for option for pt. She has f/u with surgery and physiatry.  More than 50% of 25 minute visit spent in counselling.  2. Sciatica, unspecified laterality   3. Unsteady gait PT will help pt with this.  Discussed PT to help lower fall risk. 4. Depression, unspecified depression type She did not start Cymbalta. Instructed pt to start medication. Follow up in 1 month.  5.RLS 6.TIIDM Last A1c was 7.7 . 7.Diabetic Nephropathy  HPI, Exam, and A&P Transcribed under the direction and in the presence of Richard L. Wendelyn Breslow, MD  Electronically Signed: Dimas Chyle, CMA  Julieanne Manson MD [facility] 04/20/2016 3:56 PM

## 2016-04-20 NOTE — Patient Instructions (Signed)
Start Cymbalta and follow up in 1 month after starting medication.

## 2016-04-20 NOTE — Patient Instructions (Signed)
[name] , Thank you for taking time to come for your Medicare Wellness Visit. I appreciate your ongoing commitment to your health goals. Please review the following plan we discussed and let me know if I can assist you in the future.   These are the goals we discussed: Goals    . Increase water intake          Starting 04/20/16, I will increase my water intake to 4 glasses a day.       This is a list of the screening recommended for you and due dates:  Health Maintenance  Topic Date Due  . Shingles Vaccine  04/20/2026*  . Tetanus Vaccine  04/20/2026*  . Pneumonia vaccines (2 of 2 - PPSV23) 04/28/2016  . Hemoglobin A1C  08/26/2016  . Eye exam for diabetics  09/05/2016  . Complete foot exam   01/05/2017  . Flu Shot  Completed  . DEXA scan (bone density measurement)  Completed  *Topic was postponed. The date shown is not the original due date.   Preventive Care for Adults  A healthy lifestyle and preventive care can promote health and wellness. Preventive health guidelines for adults include the following key practices.  . A routine yearly physical is a good way to check with your health care provider about your health and preventive screening. It is a chance to share any concerns and updates on your health and to receive a thorough exam.  . Visit your dentist for a routine exam and preventive care every 6 months. Brush your teeth twice a day and floss once a day. Good oral hygiene prevents tooth decay and gum disease.  . The frequency of eye exams is based on your age, health, family medical history, use  of contact lenses, and other factors. Follow your health care provider's ecommendations for frequency of eye exams.  . Eat a healthy diet. Foods like vegetables, fruits, whole grains, low-fat dairy products, and lean protein foods contain the nutrients you need without too many calories. Decrease your intake of foods high in solid fats, added sugars, and salt. Eat the right  amount of calories for you. Get information about a proper diet from your health care provider, if necessary.  . Regular physical exercise is one of the most important things you can do for your health. Most adults should get at least 150 minutes of moderate-intensity exercise (any activity that increases your heart rate and causes you to sweat) each week. In addition, most adults need muscle-strengthening exercises on 2 or more days a week.  Silver Sneakers may be a benefit available to you. To determine eligibility, you may visit the website: www.silversneakers.com or contact program at [phone] Mon-Fri between 8AM-8PM.   . Maintain a healthy weight. The body mass index (BMI) is a screening tool to identify possible weight problems. It provides an estimate of body fat based on height and weight. Your health care provider can find your BMI and can help you achieve or maintain a healthy weight.   For adults 20 years and older: ? A BMI below 18.5 is considered underweight. ? A BMI of 18.5 to 24.9 is normal. ? A BMI of 25 to 29.9 is considered overweight. ? A BMI of 30 and above is considered obese.   . Maintain normal blood lipids and cholesterol levels by exercising and minimizing your intake of saturated fat. Eat a balanced diet with plenty of fruit and vegetables. Blood tests for lipids and cholesterol should begin at  age 17 and be repeated every 5 years. If your lipid or cholesterol levels are high, you are over 50, or you are at high risk for heart disease, you may need your cholesterol levels checked more frequently. Ongoing high lipid and cholesterol levels should be treated with medicines if diet and exercise are not working.  . If you smoke, find out from your health care provider how to quit. If you do not use tobacco, please do not start.  . If you choose to drink alcohol, please do not consume more than 2 drinks per day. One drink is considered to be 12 ounces (355 mL) of beer, 5  ounces (148 mL) of wine, or 1.5 ounces (44 mL) of liquor.  . If you are 52-13 years old, ask your health care provider if you should take aspirin to prevent strokes.  . Use sunscreen. Apply sunscreen liberally and repeatedly throughout the day. You should seek shade when your shadow is shorter than you. Protect yourself by wearing long sleeves, pants, a wide-brimmed hat, and sunglasses year round, whenever you are outdoors.  . Once a month, do a whole body skin exam, using a mirror to look at the skin on your back. Tell your health care provider of new moles, moles that have irregular borders, moles that are larger than a pencil eraser, or moles that have changed in shape or color.

## 2016-04-22 ENCOUNTER — Ambulatory Visit: Payer: Medicare Other | Admitting: Physical Therapy

## 2016-04-26 ENCOUNTER — Ambulatory Visit: Payer: Medicare Other | Admitting: Physical Therapy

## 2016-05-05 ENCOUNTER — Ambulatory Visit: Payer: Medicare Other | Admitting: Physical Therapy

## 2016-05-05 ENCOUNTER — Telehealth: Payer: Self-pay | Admitting: Family Medicine

## 2016-05-05 DIAGNOSIS — M791 Myalgia, unspecified site: Secondary | ICD-10-CM

## 2016-05-05 NOTE — Therapy (Signed)
[facility] PHYSICAL AND SPORTS MEDICINE [address] [place], [place], [address] Phone: [phone]   Fax:  [phone]  Physical Therapy Treatment  Patient Details  Name: Renee CollaBetty P Carpenter MRN: [id number] Date of Birth: 07-Jul-1925 Referring Provider: Sullivan LoneGilbert  Encounter Date: 05/05/2016    Past Medical History:  Diagnosis Date  . Anemia    WAS ANEMIC  . Arthritis   . Blood transfusion    HAD 1 SEVERAL YRS AGO...3-4 YR  . Chronic kidney disease    SOME DECREASE IN KIDNEY FUNCTION  . Diabetes mellitus without complication (HCC)   . Hypertension   . Normal cardiac stress test    REQUESTING COPY FROM [facility]  . Normal echocardiogram    REQUESTING THAT RESULT -- DR Park BreedKAHN   . PONV (postoperative nausea and vomiting)   . Shortness of breath   . Spondylolysis 1998    Past Surgical History:  Procedure Laterality Date  . ABDOMINAL HYSTERECTOMY    . BACK SURGERY     2 CERVICAL SURGERIES ALSO  . CATARACTS     RIGHT EYE CATARACT  . CERVICAL DISCECTOMY  08/1995 and 2011   C4-,C5-6, C6-7 anterior cervical; Dr. Danielle DessElsner fusion and plating  . LAMINECTOMY     decompressive; Dr. [place]o [place]ile[place]uh in [place]  . LUMBAR LAMINECTOMY/DECOMPRESSION MICRODISCECTOMY  04/14/2011   Procedure: LUMBAR LAMINECTOMY/DECOMPRESSION MICRODISCECTOMY;  Surgeon: Cristi LoronJeffrey D Jenkins;  Location: [facility];  Service: Neurosurgery;  Laterality: N/A;  Lumbar Three and Lumbar Four Laminectomy  . TONSILLECTOMY AND ADENOIDECTOMY  1939    There were no vitals filed for this visit.      Subjective Assessment - 05/05/16 1505    Subjective Patient reports she is in excrutiating pain in both of her legs, her lower back, and particularly her R foot. She has a history of neuropathy and points to her PSIS bilaterally as area of most intense pain.    Pertinent History Pt reports she has been referred to neurosurgeon and a rheumatologist for her LBP because "they don't know what's going  on." She has pain across her back, along the back of her legs and at the top of her feet. This pain has been going on for 1 month. She is taking hydrocodone for pain which helps sometimes. She also reports that she has been having some balance and difficulty for a long time. She states "right now my balance is really bad." She has been ambulating with a rollator for a few years now. She had a fall when she tripped over a telephone cord about a week ago; she states she was able to get up by herself and she states that she did not hurt herself. She denies b/b changes. She reports that her BLE do feel weaker since her LBP worsened 1 month ago; she denies n/t down BLE and reports it as just pain/weakness. She denies any relieving factors and reports that sitting, a lot of activity. She reports her sleep is affected due to bil hip pain when she sleeps on her side and has increased back pain when she sleeps on her back. 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   Limitations Sitting;Lifting;Standing;Walking;House hold activities   How long can you sit comfortably? 10 mins   How long can you stand comfortably? no long   How long can you walk comfortably? 8 mins   Patient Stated Goals to walk better   Currently in Pain? Yes   Pain Score 10-Worst pain ever  Pain Location Back  Both legs and feet   Pain Orientation Left;Lower;Right   Pain Descriptors / Indicators Aching;Burning   Pain Type Chronic pain   Pain Onset More than a month ago   Pain Frequency Constant   Aggravating Factors  All positions.                                       PT Long Term Goals - 04/07/16 1615      PT LONG TERM GOAL #1   Title Pt will be independent with HEP to maximize recovery of function and decrease risk of reinjury.   Time 6   Period Weeks   Status New     PT LONG TERM GOAL #2   Title Pt will demonstrate hip PROM to WNL with no pain to demonstrate improved overall function.   Baseline  11/1: bil hip IR lacking approximately 50% of normal ROM, painful on R hip   Time 6   Period Weeks   Status New     PT LONG TERM GOAL #3   Title Pt will demonstrate improved upright posture with gait 50% of the time or more to improved bil hip flexor length, decrease stress on lower back, and improve overall function.   Time 6   Period Weeks   Status New               Plan - 05/05/16 1507    Clinical Impression Statement Attempted nerve glides, soft tissue mobilization on LLE and PSIS area, patient could not find a position of comfort in sidelying, sitting, standing. After attempts PT agreed with patient to have her go home to find a position of relief.    Rehab Potential Poor   Clinical Impairments Affecting Rehab Potential co-morbidities, chronicity of issue, potential difficulty with HEP compliance   PT Frequency 2x / week   PT Duration 6 weeks   PT Treatment/Interventions ADLs/Self Care Home Management;Cryotherapy;Electrical Stimulation;Moist Heat;Gait training;Stair training;Functional mobility training;Therapeutic activities;Therapeutic exercise;Balance training;Neuromuscular re-education;Patient/family education;Manual techniques;Passive range of motion   PT Next Visit Plan BLE strengthening, hip ROM, postural strength   PT Home Exercise Plan 11/1: seated iso clamshells, seated iso hip add, scap retraction   Consulted and Agree with Plan of Care Patient      Patient will benefit from skilled therapeutic intervention in order to improve the following deficits and impairments:  Abnormal gait, Decreased activity tolerance, Decreased balance, Decreased mobility, Decreased range of motion, Decreased strength, Difficulty walking, Hypomobility, Increased fascial restricitons, Impaired flexibility, Impaired sensation, Improper body mechanics, Postural dysfunction, Pain  Visit Diagnosis: Chronic bilateral low back pain with left-sided sciatica  Chronic bilateral low back pain with  right-sided sciatica  Unsteadiness on feet  Difficulty in walking, not elsewhere classified     Problem List Patient Active Problem List   Diagnosis Date Noted  . Chronic kidney disease, stage 3, mod decreased GFR 04/15/2016  . Ds DNA antibody positive 04/15/2016  . Radicular pain of both lower extremities 04/13/2016  . Spine pain, lumbar 04/13/2016  . ANA positive 04/06/2016  . Right upper quadrant pain 09/11/2015  . Neuropathic pain 04/15/2015  . Cervical pain 01/24/2015  . Depression 01/01/2015  . Back pain, chronic 12/14/2014  . COPD, mild (HCC) 12/14/2014  . Anxiety, generalized 12/14/2014  . Esophageal reflux 12/14/2014  . Diabetes (HCC) 12/14/2014  . Progeria syndrome 12/14/2014  . BP (high blood pressure)  12/14/2014  . Adaptive colitis 12/14/2014  . Cannot sleep 12/14/2014  . Affective disorder, major 12/14/2014  . Chronic anemia 12/14/2014  . Arthritis, degenerative 12/14/2014  . OP (osteoporosis) 12/14/2014  . Restless leg 12/14/2014  . Neuralgia neuritis, sciatic nerve 12/14/2014  . 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 Absolute anemia 03/03/2012  . Lumbar canal stenosis 02/10/2012  . DDD (degenerative disc disease), lumbosacral 11/19/2011  . Neuropathy (HCC) 02/26/2008  . Essential hypertension 02/26/2008  . Fatigue 02/26/2008  . DYSPNEA 02/26/2008   Kerin Ransom, PT, DPT    05/05/2016, 3:12 PM  [facility] PHYSICAL AND SPORTS MEDICINE [address] [place], [address] Phone: [phone]   Fax:  [phone]  Name: LEATHIE WEICH MRN: [id number] Date of Birth: June 15, 1925

## 2016-05-05 NOTE — Telephone Encounter (Signed)
Pt called saying she is still having a lot of lower back and leg pain.  Pt has taken pain med and she says it is not helping.   Please advise.   [phone]  Thanks, Barth Kirksteri

## 2016-05-06 NOTE — Telephone Encounter (Signed)
Patient reports that she has back pain and has been seen and treated for this in office she states that she has been taking Hydrocodone but it doesn not seem to be helping with pain patient reports. Patient states that she was told that she has inflammation of her spine and that this would be a chronic issue. Patient reports that she is taking her Gabapentin and ants to know if we can prescribe something else for pain other than Hydrocodone? Patient reports that pain starts at her buttock and radiates to the back of her leg, please review and advise. KW

## 2016-05-06 NOTE — Telephone Encounter (Signed)
For safety lets try going from Gabapentin to Lyrica 25mg  q hs--increasing by 25mg  every 4 days until she is on 75mg  q hs.

## 2016-05-06 NOTE — Telephone Encounter (Signed)
Patient states that she is afraid for us to change from gabapentin to Lyrica, patient states that she has been on Gabapentin for years and dosent feel this is the right decision for her at this time. Patient wants to know if you will refill her Hydrocodone prescription because she does not want to go without pain medication. KW

## 2016-05-07 ENCOUNTER — Other Ambulatory Visit: Payer: Self-pay | Admitting: Physician Assistant

## 2016-05-07 DIAGNOSIS — M791 Myalgia, unspecified site: Secondary | ICD-10-CM

## 2016-05-07 MED ORDER — HYDROCODONE-ACETAMINOPHEN 5-325 MG PO TABS: 0.5000 | ORAL_TABLET | ORAL | 0 refills | Status: DC | PRN

## 2016-05-07 NOTE — Telephone Encounter (Signed)
Pt advised and RX placed up front, per patient her friend will come and get her RX York PellantBilly Swaine-aa

## 2016-05-07 NOTE — Telephone Encounter (Signed)
Yes I will print for her since he will not be back in until Monday

## 2016-05-07 NOTE — Telephone Encounter (Signed)
Ok to rf times 1 month

## 2016-05-07 NOTE — Telephone Encounter (Signed)
Dr Sullivan LoneGilbert is not here, would you be able to sign for the RX since he already approved it?-aa

## 2016-05-10 ENCOUNTER — Encounter: Payer: Self-pay | Admitting: Physical Therapy

## 2016-05-12 ENCOUNTER — Ambulatory Visit: Payer: Medicare Other | Admitting: Physical Therapy

## 2016-05-13 ENCOUNTER — Telehealth: Payer: Self-pay | Admitting: Family Medicine

## 2016-05-13 NOTE — Telephone Encounter (Signed)
Pt called still having pain in her legs.  She's tried everything and wants to know what to do next  Her call back is [phone]  Thanks, Barth Kirksteri

## 2016-05-13 NOTE — Telephone Encounter (Signed)
Please review. Looks like patient is currently in physical therapy. Any recommendations? Thanks!

## 2016-05-17 NOTE — Telephone Encounter (Signed)
Lmtcb, I remember speaking to the patient in the past about Dr Yves Dillhasnis and injections and she really did not want to get those done if possible. Will need to let patient know this is the option that is left to refer back to Dr Yves Dillhasnis and see what they can do since she is already getting other things done-aa

## 2016-05-17 NOTE — Telephone Encounter (Signed)
I don't see any other options unless she wants to see her neurosurgeon again.

## 2016-05-17 NOTE — Telephone Encounter (Signed)
Waiting for patient to call back

## 2016-05-17 NOTE — Telephone Encounter (Signed)
Refer back to Dr. Yves Dillhasnis for consideration of injections.

## 2016-05-19 NOTE — Telephone Encounter (Signed)
lmtcb-aa 

## 2016-05-20 ENCOUNTER — Ambulatory Visit: Payer: Medicare Other | Admitting: Physical Therapy

## 2016-05-25 ENCOUNTER — Telehealth: Payer: Self-pay

## 2016-05-25 NOTE — Telephone Encounter (Signed)
Patient c/o of intense pain in her lower back radiating down both legs and feet. Patient reports she has been taking hydrocodone-acetaminophen every hours and reports very mild pain control. Patient reports she is having a very hard time walking and heat compression seems to make her lower back worse. Please advise. CB# [phone]

## 2016-05-25 NOTE — Telephone Encounter (Signed)
Patient advised as below. Patient reports that she is in a lot of pain and is not sure she can come in tomorrow. Patient reports [place]. Patient is requesting a medication called into her pharmacy.

## 2016-05-25 NOTE — Telephone Encounter (Signed)
Please review if you have any suggestions for that-aa

## 2016-05-25 NOTE — Telephone Encounter (Signed)
Please review-aa 

## 2016-05-25 NOTE — Telephone Encounter (Signed)
Double  book her tomorrow or Thursday to see me. I think she will either need to see the neurosurgeon or have another MRI.

## 2016-05-26 NOTE — Telephone Encounter (Signed)
LMTCB

## 2016-05-26 NOTE — Telephone Encounter (Signed)
Carefully try Cyclobenzaprine 5mg  TID prn,#90,1RF.

## 2016-05-27 MED ORDER — CYCLOBENZAPRINE HCL 5 MG PO TABS: 5.0000 mg | ORAL_TABLET | Freq: Three times a day (TID) | ORAL | 1 refills | Status: DC | PRN

## 2016-06-01 ENCOUNTER — Other Ambulatory Visit: Payer: Self-pay

## 2016-06-01 DIAGNOSIS — M791 Myalgia, unspecified site: Secondary | ICD-10-CM

## 2016-06-01 MED ORDER — HYDROCODONE-ACETAMINOPHEN 5-325 MG PO TABS: 0.5000 | ORAL_TABLET | ORAL | 0 refills | Status: DC | PRN

## 2016-06-01 NOTE — Telephone Encounter (Signed)
lmtcb-aa 

## 2016-06-01 NOTE — Telephone Encounter (Signed)
Rx printed

## 2016-06-01 NOTE — Telephone Encounter (Signed)
Please let patient know, I am helping on the phone this week. Thank you-aa

## 2016-06-01 NOTE — Telephone Encounter (Signed)
Patient states she is out of her medication-Norco. Last written was 05/07/16, Dr gilbert's patient and he is out of the office this week. Patient will get someone to pick it up for her because she can not get out due to pain. She will let us know who it will be when we call her back. CB:[phone], please review.

## 2016-06-01 NOTE — Telephone Encounter (Signed)
Patient advised, Renee Carpenter will pick up RX for patient-aa

## 2016-06-03 ENCOUNTER — Telehealth: Payer: Self-pay

## 2016-06-03 ENCOUNTER — Other Ambulatory Visit: Payer: Self-pay

## 2016-06-03 NOTE — Telephone Encounter (Signed)
PA for Flexeril was denied due to not trying 2 alternative medications that are covered, the fax about this is in patient;s chart under media, please let me know how you would like to proceed-aa

## 2016-06-09 ENCOUNTER — Telehealth: Payer: Self-pay | Admitting: Family Medicine

## 2016-06-09 MED ORDER — TIZANIDINE HCL 4 MG PO TABS: 4.0000 mg | ORAL_TABLET | Freq: Three times a day (TID) | ORAL | 0 refills | Status: DC | PRN

## 2016-06-09 NOTE — Telephone Encounter (Signed)
Patient states she has a epidural scheduled for next week. Patient was just wondering if she should have a epidural done. Advised patient to go with the neurosurgeons recommendations. Patient agreed. She was also worried about taking Hydrocodone every 4 hours along with Gabapentin. Patient wanted to know if she had to take the Hydrocodone every 4 hours. Advised patient that the RX is written for every 4 hours PRN, and she didn't have to take every 4 hours if not needed.

## 2016-06-09 NOTE — Telephone Encounter (Signed)
Pt called wanting advise about getting an epidural with her for neuro surgeon.  She wants to give information to how she is doing and wants his input and opinion on what the neuro surgeon plans to do.  She stated Dr. Sullivan LoneGilbert knows all about her.  Pt [phone]  Thanks, Barth Kirkseri

## 2016-06-09 NOTE — Telephone Encounter (Signed)
RX sent to [facility] pharmacy. Patient advised.

## 2016-06-09 NOTE — Telephone Encounter (Signed)
Try tizanidine 4 mg 3 times a day when necessary

## 2016-06-14 ENCOUNTER — Telehealth: Payer: Self-pay | Admitting: Emergency Medicine

## 2016-06-14 ENCOUNTER — Telehealth: Payer: Self-pay | Admitting: Family Medicine

## 2016-06-14 ENCOUNTER — Ambulatory Visit: Payer: Medicare Other | Admitting: Family Medicine

## 2016-06-14 NOTE — Telephone Encounter (Signed)
Spoke with pt. Clarified.

## 2016-06-14 NOTE — Telephone Encounter (Signed)
Pt stated that there was an appt mix up and she needed to speak with [place]any or [place]Elena ASAP. I asked if I could help her, she stated that she needed to speak with [place]ittany or GoehnerElena only. Thanks TNP

## 2016-06-14 NOTE — Telephone Encounter (Signed)
Pt called about her appt that was scheduled today by her caregiver. She is limited to being able to walk and moved etc. Due to the pain. She was not aware of the appt and wanted to know if she needed to come in. Also wanted to know when she was suppose to take the muscle relaxer and if she was suppose to take it with the Norco. I spoke with Dr. Sullivan LoneGilbert about this and since pt is having an epidural this coming Wednesday, wanted her to wait to come in until after that to see if that helped because the only other option for her id stronger narcotics and at her age reluctant to do so. Pt caregiver was advise of this information and the reason they called to let Dr. Sullivan LoneGilbert see her decline because of the pain. She was agreeable with the decision on waiting to see if the epidural helped.

## 2016-06-15 ENCOUNTER — Telehealth: Payer: Self-pay | Admitting: Family Medicine

## 2016-06-15 NOTE — Telephone Encounter (Signed)
Pt advised. Carrick Rijos Drozdowski, CMA  

## 2016-06-15 NOTE — Telephone Encounter (Signed)
Pt states she has stopped taking the Rx tiZANidine (ZANAFLEX) 4 MG tablet due to having an allergic reaction.  Pt states she almost passed out 2 different time, felt dizzy and confused.  Pt states she also has diarrhea.  Pt just wanted Dr Sullivan LoneGilbert to know this.  HY#[phone]/XBCB#[phone]/MW

## 2016-06-15 NOTE — Telephone Encounter (Signed)
D/c Tizanadine.

## 2016-06-15 NOTE — Telephone Encounter (Signed)
Please review. Nakhi Choi Drozdowski, CMA  

## 2016-06-18 ENCOUNTER — Telehealth: Payer: Self-pay | Admitting: Family Medicine

## 2016-06-18 ENCOUNTER — Other Ambulatory Visit: Payer: Self-pay

## 2016-06-18 DIAGNOSIS — M791 Myalgia, unspecified site: Secondary | ICD-10-CM

## 2016-06-18 MED ORDER — HYDROCODONE-ACETAMINOPHEN 5-325 MG PO TABS: 0.5000 | ORAL_TABLET | ORAL | 0 refills | Status: DC | PRN

## 2016-06-18 NOTE — Telephone Encounter (Signed)
Renee AaseBilly Carpenter (on pt's DPR) came by to pick up pt's Rx for HYDROcodone-acetaminophen (NORCO/VICODIN) 5-325 MG tablet. We advised that it wasn't ready and that Dr. Sullivan LoneGilbert isn't in the office today. Mr. Renee Carpenter request that another provider look into this b/c pt advised him she is out of the medication and in a lot of pain. Mr. Renee Carpenter request a call back b/c he is waiting in town to come get Rx. Please advise. Thanks TNP

## 2016-06-18 NOTE — Telephone Encounter (Signed)
Pt needs refill on her    Take 1 tablet (600 mg total) by mouth 3 (three) times daily.   HYDROcodone-acetaminophen (NORCO/VICODIN) 5-325 MG tablet          Please call pt when ready [phone]  Thanks teri

## 2016-06-18 NOTE — Telephone Encounter (Signed)
Mr. Renee Carpenter called back inquiring about the pain medication to see if it was ready yet.  He wanted to note that she is not taking the "muscle relaxer" that Dr. Sullivan LoneGilbert prescribed.  He said he lives out of town and would like to pick up the RX today.    Please call when ready per his request.  Thanks Renee Carpenter

## 2016-06-18 NOTE — Telephone Encounter (Signed)
Left message with Mr. Renee Carpenter to call me back.    The Rx is ready to be picked up.

## 2016-06-18 NOTE — Telephone Encounter (Signed)
Printed

## 2016-06-18 NOTE — Telephone Encounter (Signed)
Please review-aa 

## 2016-06-30 DIAGNOSIS — M5126 Other intervertebral disc displacement, lumbar region: Secondary | ICD-10-CM | POA: Insufficient documentation

## 2016-07-02 ENCOUNTER — Encounter: Payer: Self-pay | Admitting: Family Medicine

## 2016-07-02 ENCOUNTER — Ambulatory Visit (INDEPENDENT_AMBULATORY_CARE_PROVIDER_SITE_OTHER): Payer: Medicare Other | Admitting: Family Medicine

## 2016-07-02 ENCOUNTER — Other Ambulatory Visit: Payer: Self-pay

## 2016-07-02 VITALS — BP 128/70 | HR 86 | Temp 98.1°F | Resp 18

## 2016-07-02 DIAGNOSIS — R609 Edema, unspecified: Secondary | ICD-10-CM

## 2016-07-02 DIAGNOSIS — G629 Polyneuropathy, unspecified: Secondary | ICD-10-CM | POA: Diagnosis not present

## 2016-07-02 MED ORDER — GABAPENTIN 800 MG PO TABS: 800.0000 mg | ORAL_TABLET | Freq: Three times a day (TID) | ORAL | 3 refills | Status: DC

## 2016-07-02 NOTE — Progress Notes (Signed)
Patient: Renee Carpenter Female    DOB: [birth year]   83 y.o.   MRN: [id number] Visit Date: 07/02/2016  Today's Provider: Mila Merryonald Fisher, MD   Chief Complaint  Patient presents with  . Edema   Subjective:    HPI Pt is here today for bilateral leg swelling for about 3 days. She is wheel chair bound right now due to lower back pain and weakness causing her not to walk. She normally walks. She is scheduled for back surgery for lumbar herniated disk on February 5th by Dr. Wyline MoodBranch [place] have this [place]. She noticed her ankles and feet started swelling about 3 days ago. She reports that she does lay in the bed most of the time but she is in her wheel chair if she is not in bed. She reports that the swelling was worse yesterday than today and that she has started elevating her feet in bed when she noticed the swelling. Denies any increased shortness of breath or chest pain. Has had no injuries. No change in medications. No change In diet.   Wt Readings from Last 3 Encounters:  04/20/16 167 lb (75.8 kg)  04/20/16 167 lb (75.8 kg)  03/23/16 162 lb (73.5 kg)     She also has long history of neuropathy on gabapentin. She states pains in her upper legs have been getting progressively worse.     Allergies  Allergen Reactions  . Acetaminophen Itching and Other (See Comments)    Stomach upset  . Aspirin Itching and Other (See Comments)    Stomach pain  . Other Other (See Comments)    Novocaine, strange feeling  . Sulfa Antibiotics Other (See Comments)  . Tizanidine     Diarrhea, almost passed out, felt dizzy and confused  . Caffeine Palpitations  . Ginger Palpitations  . Ibuprofen Rash     Current Outpatient Prescriptions:  .  aspirin EC 81 MG tablet, Take by mouth., Disp: , Rfl:  .  Calcium-Magnesium-Vitamin D (CALCIUM 500 PO), Take by mouth., Disp: , Rfl:  .  Coenzyme Q10 (CO Q10) 200 MG CAPS, Take by mouth., Disp: , Rfl:  .  esomeprazole (NEXIUM) 40 MG  capsule, Take by mouth., Disp: , Rfl:  .  estrogens, conjugated, (PREMARIN) 0.3 MG tablet, TAKE 1 TABLET BY MOUTH EVERY DAY FOR 21 DAYS THEN DO NOT TAKE FOR 7 DAYS, Disp: 30 tablet, Rfl: 11 .  fish oil-omega-3 fatty acids 1000 MG capsule, Take 1 g by mouth 2 (two) times daily.  , Disp: , Rfl:  .  gabapentin (NEURONTIN) 600 MG tablet, Take 1 tablet (600 mg total) by mouth 3 (three) times daily., Disp: 90 tablet, Rfl: 12 .  HYDROcodone-acetaminophen (NORCO/VICODIN) 5-325 MG tablet, Take 0.5-1 tablets by mouth every 4 (four) hours as needed for moderate pain., Disp: 100 tablet, Rfl: 0 .  hydroxypropyl methylcellulose (ISOPTO TEARS) 2.5 % ophthalmic solution, Place 1 drop into both eyes 3 (three) times daily as needed. Dry eyes , Disp: , Rfl:  .  hydrOXYzine (ATARAX/VISTARIL) 10 MG tablet, Take by mouth., Disp: , Rfl:  .  lidocaine (LIDODERM) 5 %, Place 1 patch onto the skin daily. Remove & Discard patch within 12 hours or as directed by MD, Disp: 30 patch, Rfl: 2 .  losartan (COZAAR) 50 MG tablet, Take 1 tablet (50 mg total) by mouth daily., Disp: 90 tablet, Rfl: 3 .  MAGNESIUM-OXIDE 400 (241.3 Mg) MG tablet, TAKE 1 TABLET BY MOUTH TWICE  DAILY, Disp: 60 tablet, Rfl: 12 .  MULTIPLE VITAMIN PO, Take by mouth., Disp: , Rfl:  .  rOPINIRole (REQUIP) 1 MG tablet, Take 1 mg by mouth. 1/2 tablet four times daily., Disp: , Rfl:  .  DULoxetine (CYMBALTA) 20 MG capsule, Take 1 capsule (20 mg total) by mouth daily. (Patient not taking: Reported on 07/02/2016), Disp: 30 capsule, Rfl: 3  Review of Systems  Constitutional: Negative.   HENT: Negative.   Eyes: Negative.   Respiratory: Negative.   Cardiovascular: Positive for leg swelling.  Gastrointestinal: Negative.   Endocrine: Negative.   Genitourinary: Negative.   Musculoskeletal: Positive for back pain.  Skin: Negative.   Allergic/Immunologic: Negative.   Neurological: Negative.   Hematological: Negative.   Psychiatric/Behavioral: Negative.     Social  History  Substance Use Topics  . Smoking status: Never Smoker  . Smokeless tobacco: Never Used  . Alcohol use No   Objective:   BP 128/70 (BP Location: Left Arm, Patient Position: Sitting, Cuff Size: Normal)   Pulse 86   Temp 98.1 F (36.7 C) (Oral)   Resp 18   SpO2 98%   Physical Exam   General Appearance:    Alert, cooperative, no distress  Eyes:    PERRL, conjunctiva/corneas clear, EOM's intact       Lungs:     Clear to auscultation bilaterally, respirations unlabored  Heart:    Regular rate and rhythm  Neurologic:   Awake, alert, oriented x 3. No apparent focal neurological           defect.   Ext: 1+ bilateral ankle and pedal edema. No skin lesions. No erythema. No cords. No calf tenderness. Negative Homans sign.        Assessment & Plan:     1. Edema, unspecified type Mild. She does not appear fluid overloaded and has no signs or symptoms of CHF. Check labs. If normal will treat with compression stocking and sodium restriction.  - Comprehensive metabolic panel - CBC - T4 AND TSH  2. Neuropathy (HCC) Multifactorial, likely related herniated disk. Will try increasing from 600 to 800mg  gabepentin - gabapentin (NEURONTIN) 800 MG tablet; Take 1 tablet (800 mg total) by mouth 3 (three) times daily.  Dispense: 90 tablet; Refill: 3  The entirety of the information documented in the History of Present Illness, Review of Systems and Physical Exam were personally obtained by me. Portions of this information were initially documented by [place] CMA and reviewed by me for thoroughness and accuracy.        Mila Merry, MD  [facility]

## 2016-07-03 LAB — CBC
HEMATOCRIT: 29.8 % — AB (ref 34.0–46.6)
HEMOGLOBIN: 10.3 g/dL — AB (ref 11.1–15.9)
MCH: 38.1 pg — AB (ref 26.6–33.0)
MCHC: 34.6 g/dL (ref 31.5–35.7)
MCV: 110 fL — ABNORMAL HIGH (ref 79–97)
Platelets: 290 10*3/uL (ref 150–379)
RBC: 2.7 x10E6/uL — AB (ref 3.77–5.28)
RDW: 17.2 % — ABNORMAL HIGH (ref 12.3–15.4)
WBC: 8.3 10*3/uL (ref 3.4–10.8)

## 2016-07-03 LAB — COMPREHENSIVE METABOLIC PANEL
A/G RATIO: 1.6 (ref 1.2–2.2)
ALT: 16 IU/L (ref 0–32)
AST: 23 IU/L (ref 0–40)
Albumin: 3.9 g/dL (ref 3.5–4.7)
Alkaline Phosphatase: 82 IU/L (ref 39–117)
BUN/Creatinine Ratio: 18 (ref 12–28)
BUN: 17 mg/dL (ref 8–27)
Bilirubin Total: 0.5 mg/dL (ref 0.0–1.2)
CALCIUM: 9.9 mg/dL (ref 8.7–10.3)
CO2: 27 mmol/L (ref 18–29)
CREATININE: 0.94 mg/dL (ref 0.57–1.00)
Chloride: 98 mmol/L (ref 96–106)
GFR, EST AFRICAN AMERICAN: 63 mL/min/{1.73_m2} (ref >59)
GFR, EST NON AFRICAN AMERICAN: 54 mL/min/{1.73_m2} — AB (ref >59)
GLUCOSE: 149 mg/dL — AB (ref 65–99)
Globulin, Total: 2.5 g/dL (ref 1.5–4.5)
Potassium: 5.2 mmol/L (ref 3.5–5.2)
Sodium: 141 mmol/L (ref 134–144)
TOTAL PROTEIN: 6.4 g/dL (ref 6.0–8.5)

## 2016-07-03 LAB — T4 AND TSH
T4, Total: 8.4 ug/dL (ref 4.5–12.0)
TSH: 3.08 u[IU]/mL (ref 0.450–4.500)

## 2016-07-05 ENCOUNTER — Telehealth: Payer: Self-pay

## 2016-07-05 NOTE — Telephone Encounter (Signed)
Left message to call back  

## 2016-07-05 NOTE — Telephone Encounter (Signed)
-----   Message from Malva Limesonald E Fisher, MD sent at 07/05/2016 10:46 AM EST ----- She is mildly anemic and blood sugar is slightly elevated. Otherwise labs are completely  normal. If she is still having swelling then I would recommend she wear compression stockings.

## 2016-07-08 ENCOUNTER — Other Ambulatory Visit: Payer: Self-pay | Admitting: Family Medicine

## 2016-07-08 NOTE — Telephone Encounter (Signed)
Patient advised of results and verbally voiced understanding. Patient request a copy of her labs be faxed to her Neurosurgeon Dr. Donette Larryharles Branch.

## 2016-07-09 DIAGNOSIS — I272 Pulmonary hypertension, unspecified: Secondary | ICD-10-CM | POA: Insufficient documentation

## 2016-07-14 ENCOUNTER — Encounter
Admission: RE | Admit: 2016-07-14 | Discharge: 2016-07-14 | Disposition: A | Discharge status: Home / Self Care | Payer: Medicare Other | Source: Ambulatory Visit | Attending: Internal Medicine | Admitting: Internal Medicine

## 2016-07-14 DIAGNOSIS — R319 Hematuria, unspecified: Secondary | ICD-10-CM | POA: Insufficient documentation

## 2016-07-15 ENCOUNTER — Telehealth: Payer: Self-pay | Admitting: Family Medicine

## 2016-07-15 NOTE — Telephone Encounter (Signed)
Called Pt to schedule AWV with [facility] - knb °

## 2016-07-19 DIAGNOSIS — R319 Hematuria, unspecified: Secondary | ICD-10-CM | POA: Diagnosis present

## 2016-07-19 LAB — URINALYSIS, COMPLETE (UACMP) WITH MICROSCOPIC
Bilirubin Urine: NEGATIVE
Glucose, UA: NEGATIVE mg/dL
Ketones, ur: NEGATIVE mg/dL
Nitrite: NEGATIVE
Protein, ur: 100 mg/dL — AB
SPECIFIC GRAVITY, URINE: 1.006 (ref 1.005–1.030)
pH: 6 (ref 5.0–8.0)

## 2016-07-21 ENCOUNTER — Non-Acute Institutional Stay (SKILLED_NURSING_FACILITY): Payer: Medicare Other | Admitting: Gerontology

## 2016-07-21 DIAGNOSIS — R319 Hematuria, unspecified: Secondary | ICD-10-CM | POA: Diagnosis not present

## 2016-07-21 DIAGNOSIS — N390 Urinary tract infection, site not specified: Secondary | ICD-10-CM

## 2016-07-21 LAB — GLUCOSE, CAPILLARY: GLUCOSE-CAPILLARY: 222 mg/dL — AB (ref 65–99)

## 2016-07-21 NOTE — Progress Notes (Signed)
Location:      Place of Service:  SNF (31) Provider:  Lorenso Quarry, NP-C  Megan Mans, MD  Patient Care Team: Maple Hudson., MD as PCP - General (Unknown Physician Specialty) Galen Manila, MD as Referring Physician (Ophthalmology) Lonell Face, MD as Consulting Physician (Neurology) Recardo Evangelist, DPM as Attending Physician (Podiatry) Donette Larry, MD as Referring Physician (Neurosurgery) Deirdre Evener, MD as Consulting Physician (Dermatology)  Extended Emergency Contact Information Primary Emergency Contact: Sharolyn Douglas, [place] Home Phone: [phone] Relation: Sister Secondary Emergency Contact: Jettie Pagan [place] Home Phone: [phone] Mobile Phone: [phone] Relation: Friend  Code Status:  full Goals of care: Advanced Directive information Advanced Directives 04/20/2016  Does Patient Have a Medical Advance Directive? Yes  Type of Advance Directive Living will;Healthcare Power of Attorney  Copy of Healthcare Power of Attorney in Chart? No - copy requested  Pre-existing out of facility DNR order (yellow form or pink MOST form) -     Chief Complaint  Patient presents with  . Acute Visit    HPI:  Pt is a 83 y.o. female seen today for an acute visit for UTI. While in the hospital, pt was having urinary retention and had to be in & out catheterized several times for inability to void. Per chart review, the MD in the hospital ordered a UA to be obtained. It appears that it was not done and was shared with our nursing to obtain a sample, which nursing did. Pt denies any burning with urination, frequency, urgency. Pt states while in the hospital, she could not "go." Once the catheter was placed, she feels as if it "opened things up" and now she can void. Pt reports no other symptoms, feels pain is controlled. Pt denies n/v/d/f/c/cp/sob/ha/abd pain/dizziness. VSS. No other  complaints.      Past Medical History:  Diagnosis Date  . Anemia    WAS ANEMIC  . Arthritis   . Blood transfusion    HAD 1 SEVERAL YRS AGO...3-4 YR  . Chronic kidney disease    SOME DECREASE IN KIDNEY FUNCTION  . Diabetes mellitus without complication (HCC)   . Hypertension   . Normal cardiac stress test    REQUESTING COPY FROM [facility]  . Normal echocardiogram    REQUESTING THAT RESULT -- DR Park Breed   . PONV (postoperative nausea and vomiting)   . Shortness of breath   . Spondylolysis 1998   Past Surgical History:  Procedure Laterality Date  . ABDOMINAL HYSTERECTOMY    . BACK SURGERY     2 CERVICAL SURGERIES ALSO  . CATARACTS     RIGHT EYE CATARACT  . CERVICAL DISCECTOMY  08/1995 and 2011   C4-,C5-6, C6-7 anterior cervical; Dr. Danielle Dess fusion and plating  . LAMINECTOMY     decompressive; Dr. Polo Riley in [place]  . LUMBAR LAMINECTOMY/DECOMPRESSION MICRODISCECTOMY  04/14/2011   Procedure: LUMBAR LAMINECTOMY/DECOMPRESSION MICRODISCECTOMY;  Surgeon: Cristi Loron;  Location: [facility];  Service: Neurosurgery;  Laterality: N/A;  Lumbar Three and Lumbar Four Laminectomy  . TONSILLECTOMY AND ADENOIDECTOMY  1939    Allergies  Allergen Reactions  . Acetaminophen Itching and Other (See Comments)    Stomach upset  . Aspirin Itching and Other (See Comments)    Stomach pain  . Other Other (See Comments)    Novocaine, strange feeling  . Sulfa Antibiotics Other (See Comments)  . Tizanidine  Diarrhea, almost passed out, felt dizzy and confused  . Caffeine Palpitations  . Ginger Palpitations  . Ibuprofen Rash    Allergies as of 07/21/2016      Reactions   Acetaminophen Itching, Other (See Comments)   Stomach upset   Aspirin Itching, Other (See Comments)   Stomach pain   Other Other (See Comments)   Novocaine, strange feeling   Sulfa Antibiotics Other (See Comments)   Tizanidine    Diarrhea, almost passed out, felt dizzy and confused   Caffeine Palpitations    Ginger Palpitations   Ibuprofen Rash      Medication List       Accurate as of 07/21/16  3:10 PM. Always use your most recent med list.          aspirin EC 81 MG tablet Take by mouth.   CALCIUM 500 PO Take by mouth.   Co Q10 200 MG Caps Take by mouth.   DULoxetine 20 MG capsule Commonly known as:  CYMBALTA Take 1 capsule (20 mg total) by mouth daily.   estrogens (conjugated) 0.3 MG tablet Commonly known as:  PREMARIN TAKE 1 TABLET BY MOUTH EVERY DAY FOR 21 DAYS THEN DO NOT TAKE FOR 7 DAYS   fish oil-omega-3 fatty acids 1000 MG capsule Take 1 g by mouth 2 (two) times daily.   gabapentin 800 MG tablet Commonly known as:  NEURONTIN Take 1 tablet (800 mg total) by mouth 3 (three) times daily.   HYDROcodone-acetaminophen 5-325 MG tablet Commonly known as:  NORCO/VICODIN Take 0.5-1 tablets by mouth every 4 (four) hours as needed for moderate pain.   hydroxypropyl methylcellulose / hypromellose 2.5 % ophthalmic solution Commonly known as:  ISOPTO TEARS / GONIOVISC Place 1 drop into both eyes 3 (three) times daily as needed. Dry eyes   hydrOXYzine 10 MG tablet Commonly known as:  ATARAX/VISTARIL Take by mouth.   lidocaine 5 % Commonly known as:  LIDODERM Place 1 patch onto the skin daily. Remove & Discard patch within 12 hours or as directed by MD   losartan 50 MG tablet Commonly known as:  COZAAR Take 1 tablet (50 mg total) by mouth daily.   MAGNESIUM-OXIDE 400 (241.3 Mg) MG tablet Generic drug:  magnesium oxide TAKE 1 TABLET BY MOUTH TWICE DAILY   MULTIPLE VITAMIN PO Take by mouth.   NEXIUM 40 MG capsule Generic drug:  esomeprazole Take by mouth.   rOPINIRole 1 MG tablet Commonly known as:  REQUIP Take 1 mg by mouth. 1/2 tablet four times daily.   tiZANidine 4 MG tablet Commonly known as:  ZANAFLEX TAKE 1 TABLET(4 MG) BY MOUTH EVERY 8 HOURS AS NEEDED FOR MUSCLE SPASMS       Review of Systems  Constitutional: Negative for activity change,  appetite change, chills, diaphoresis and fever.  HENT: Negative for congestion, sneezing, sore throat, trouble swallowing and voice change.   Eyes: Negative for pain, redness and visual disturbance.  Respiratory: Negative for apnea, cough, choking, chest tightness, shortness of breath and wheezing.   Cardiovascular: Negative for chest pain, palpitations and leg swelling.  Gastrointestinal: Negative for abdominal distention, abdominal pain, constipation, diarrhea and nausea.  Genitourinary: Negative for difficulty urinating, dysuria, frequency and urgency.  Musculoskeletal: Negative for back pain, gait problem and myalgias. Arthralgias: typical arthritis.  Skin: Negative for color change, pallor, rash and wound.  Neurological: Negative for dizziness, tremors, syncope, speech difficulty, weakness, numbness and headaches.  Psychiatric/Behavioral: Negative for agitation and behavioral problems.  All other systems reviewed  and are negative.   Immunization History  Administered Date(s) Administered  . Influenza, High Dose Seasonal PF 04/29/2015, 04/20/2016  . Pneumococcal Conjugate-13 04/29/2015   Pertinent  Health Maintenance Due  Topic Date Due  . PNA vac Low Risk Adult (2 of 2 - PPSV23) 04/28/2016  . HEMOGLOBIN A1C  08/26/2016  . OPHTHALMOLOGY EXAM  09/05/2016  . FOOT EXAM  01/05/2017  . INFLUENZA VACCINE  Completed  . DEXA SCAN  Completed   Fall Risk  04/20/2016 08/27/2015 04/29/2015  Falls in the past year? Yes No No  Number falls in past yr: 1 - -  Injury with Fall? No - -  Risk for fall due to : Impaired balance/gait - -   Functional Status Survey:    Vitals:   07/20/16 0520  BP: (!) 142/55  Pulse: 81  Resp: 16  Temp: 98.7 F (37.1 C)  SpO2: 97%  Weight: 160 lb 3.2 oz (72.7 kg)   Body mass index is 25.86 kg/m. Physical Exam  Constitutional: She is oriented to person, place, and time. Vital signs are normal. She appears well-developed and well-nourished. She is active  and cooperative. She does not appear ill. No distress.  HENT:  Head: Normocephalic and atraumatic.  Mouth/Throat: Uvula is midline, oropharynx is clear and moist and mucous membranes are normal. Mucous membranes are not pale, not dry and not cyanotic.  Eyes: Conjunctivae, EOM and lids are normal. Pupils are equal, round, and reactive to light.  Neck: Trachea normal, normal range of motion and full passive range of motion without pain. Neck supple. No JVD present. No tracheal deviation, no edema and no erythema present. No thyromegaly present.  Cardiovascular: Normal rate, regular rhythm, intact distal pulses and normal pulses.  Exam reveals no gallop, no distant heart sounds and no friction rub.   No murmur heard. Pulmonary/Chest: Effort normal and breath sounds normal. No accessory muscle usage. No respiratory distress. She has no wheezes. She has no rales. She exhibits no tenderness.  Abdominal: Normal appearance and bowel sounds are normal. She exhibits no distension and no ascites. There is no tenderness. There is no CVA tenderness.  Musculoskeletal: Normal range of motion. She exhibits no edema or tenderness.  Expected osteoarthritis, stiffness  Neurological: She is alert and oriented to person, place, and time. She has normal strength.  Skin: Skin is warm, dry and intact. No rash noted. She is not diaphoretic. No cyanosis or erythema. No pallor. Nails show no clubbing.  Psychiatric: She has a normal mood and affect. Her speech is normal and behavior is normal. Judgment and thought content normal. Cognition and memory are normal.  Nursing note and vitals reviewed.   Labs reviewed:  Recent Labs  02/27/16 1141 03/23/16 1603 07/02/16 1655  NA 143 139 141  K 5.2 5.5* 5.2  CL 101 95* 98  CO2 26 31* 27  GLUCOSE 174* 157* 149*  BUN 19 29* 17  CREATININE 1.18* 1.41* 0.94  CALCIUM 9.7 10.3 9.9  PHOS  --  3.2  --     Recent Labs  02/27/16 1141 03/23/16 1603 07/02/16 1655  AST 27   --  23  ALT 22  --  16  ALKPHOS 69  --  82  BILITOT 0.7  --  0.5  PROT 6.8  --  6.4  ALBUMIN 4.1 4.0 3.9    Recent Labs  02/27/16 1141 03/23/16 1603 07/02/16 1655  WBC 7.6 11.2* 8.3  NEUTROABS 2.9 5.4  --   HCT 31.4*  31.9* 29.8*  MCV 114* 112* 110*  PLT 288 286 290   Lab Results  Component Value Date   TSH 3.080 07/02/2016   Lab Results  Component Value Date   HGBA1C 7.7 (H) 02/27/2016   Lab Results  Component Value Date   CHOL 142 02/27/2016   HDL 64 02/27/2016   LDLCALC 54 02/27/2016   TRIG 120 02/27/2016    Significant Diagnostic Results in last 30 days:  No results found.  Assessment/Plan 1. Urinary tract infection without hematuria, site unspecified  Cipro 250 mg 1 tablet PO Q 12 hours x 5 days  Increase PO fluid intake  Family/ staff Communication:   Total Time:  Documentation:  Face to Face:  Family/Phone:   Labs/tests ordered:  Ua, c&s  Medication list reviewed and assessed for continued appropriateness.  Brynda Rim, NP-C Geriatrics [facility] [phone] [address], [place] [address] Cell Phone (Mon-Fri 8am-5pm):  [phone] On Call:  [phone] & follow prompts after 5pm & weekends Office Phone:  [phone] Office Fax:  [phone]

## 2016-07-22 DIAGNOSIS — R319 Hematuria, unspecified: Secondary | ICD-10-CM | POA: Diagnosis not present

## 2016-07-22 LAB — URINE CULTURE

## 2016-07-22 LAB — GLUCOSE, CAPILLARY
GLUCOSE-CAPILLARY: 171 mg/dL — AB (ref 65–99)
Glucose-Capillary: 257 mg/dL — ABNORMAL HIGH (ref 65–99)

## 2016-07-23 DIAGNOSIS — R319 Hematuria, unspecified: Secondary | ICD-10-CM | POA: Diagnosis not present

## 2016-07-23 LAB — GLUCOSE, CAPILLARY
GLUCOSE-CAPILLARY: 217 mg/dL — AB (ref 65–99)
Glucose-Capillary: 172 mg/dL — ABNORMAL HIGH (ref 65–99)
Glucose-Capillary: 229 mg/dL — ABNORMAL HIGH (ref 65–99)

## 2016-07-24 DIAGNOSIS — R319 Hematuria, unspecified: Secondary | ICD-10-CM | POA: Diagnosis not present

## 2016-07-24 LAB — GLUCOSE, CAPILLARY
GLUCOSE-CAPILLARY: 211 mg/dL — AB (ref 65–99)
GLUCOSE-CAPILLARY: 170 mg/dL — AB (ref 65–99)
Glucose-Capillary: 163 mg/dL — ABNORMAL HIGH (ref 65–99)
Glucose-Capillary: 178 mg/dL — ABNORMAL HIGH (ref 65–99)

## 2016-07-25 DIAGNOSIS — R319 Hematuria, unspecified: Secondary | ICD-10-CM | POA: Diagnosis not present

## 2016-07-25 LAB — GLUCOSE, CAPILLARY
Glucose-Capillary: 181 mg/dL — ABNORMAL HIGH (ref 65–99)
Glucose-Capillary: 200 mg/dL — ABNORMAL HIGH (ref 65–99)

## 2016-07-26 DIAGNOSIS — R319 Hematuria, unspecified: Secondary | ICD-10-CM | POA: Diagnosis not present

## 2016-07-26 LAB — GLUCOSE, CAPILLARY: GLUCOSE-CAPILLARY: 146 mg/dL — AB (ref 65–99)

## 2016-07-27 LAB — GLUCOSE, CAPILLARY
GLUCOSE-CAPILLARY: 151 mg/dL — AB (ref 65–99)
Glucose-Capillary: 191 mg/dL — ABNORMAL HIGH (ref 65–99)

## 2016-07-28 ENCOUNTER — Encounter: Payer: Self-pay | Admitting: Family Medicine

## 2016-07-28 DIAGNOSIS — R319 Hematuria, unspecified: Secondary | ICD-10-CM | POA: Diagnosis not present

## 2016-07-28 LAB — GLUCOSE, CAPILLARY
GLUCOSE-CAPILLARY: 208 mg/dL — AB (ref 65–99)
Glucose-Capillary: 170 mg/dL — ABNORMAL HIGH (ref 65–99)

## 2016-07-29 DIAGNOSIS — R319 Hematuria, unspecified: Secondary | ICD-10-CM | POA: Diagnosis not present

## 2016-07-29 LAB — GLUCOSE, CAPILLARY: GLUCOSE-CAPILLARY: 153 mg/dL — AB (ref 65–99)

## 2016-07-30 DIAGNOSIS — R319 Hematuria, unspecified: Secondary | ICD-10-CM | POA: Diagnosis not present

## 2016-07-30 LAB — GLUCOSE, CAPILLARY
GLUCOSE-CAPILLARY: 172 mg/dL — AB (ref 65–99)
Glucose-Capillary: 175 mg/dL — ABNORMAL HIGH (ref 65–99)

## 2016-07-31 DIAGNOSIS — R319 Hematuria, unspecified: Secondary | ICD-10-CM | POA: Diagnosis not present

## 2016-07-31 LAB — GLUCOSE, CAPILLARY: GLUCOSE-CAPILLARY: 174 mg/dL — AB (ref 65–99)

## 2016-08-01 DIAGNOSIS — R319 Hematuria, unspecified: Secondary | ICD-10-CM | POA: Diagnosis not present

## 2016-08-01 LAB — GLUCOSE, CAPILLARY: GLUCOSE-CAPILLARY: 155 mg/dL — AB (ref 65–99)

## 2016-08-02 DIAGNOSIS — R319 Hematuria, unspecified: Secondary | ICD-10-CM | POA: Diagnosis not present

## 2016-08-02 LAB — GLUCOSE, CAPILLARY
GLUCOSE-CAPILLARY: 150 mg/dL — AB (ref 65–99)
GLUCOSE-CAPILLARY: 154 mg/dL — AB (ref 65–99)

## 2016-08-03 DIAGNOSIS — R319 Hematuria, unspecified: Secondary | ICD-10-CM | POA: Diagnosis not present

## 2016-08-03 LAB — GLUCOSE, CAPILLARY: Glucose-Capillary: 161 mg/dL — ABNORMAL HIGH (ref 65–99)

## 2016-08-04 DIAGNOSIS — R319 Hematuria, unspecified: Secondary | ICD-10-CM | POA: Diagnosis not present

## 2016-08-04 LAB — GLUCOSE, CAPILLARY
GLUCOSE-CAPILLARY: 158 mg/dL — AB (ref 65–99)
Glucose-Capillary: 174 mg/dL — ABNORMAL HIGH (ref 65–99)

## 2016-08-05 ENCOUNTER — Encounter
Admission: RE | Admit: 2016-08-05 | Discharge: 2016-08-05 | Disposition: A | Discharge status: Home / Self Care | Payer: Medicare Other | Source: Ambulatory Visit | Attending: Internal Medicine | Admitting: Internal Medicine

## 2016-08-05 DIAGNOSIS — E119 Type 2 diabetes mellitus without complications: Secondary | ICD-10-CM | POA: Insufficient documentation

## 2016-08-05 LAB — GLUCOSE, CAPILLARY: Glucose-Capillary: 162 mg/dL — ABNORMAL HIGH (ref 65–99)

## 2016-08-06 DIAGNOSIS — E119 Type 2 diabetes mellitus without complications: Secondary | ICD-10-CM | POA: Diagnosis not present

## 2016-08-06 LAB — GLUCOSE, CAPILLARY: GLUCOSE-CAPILLARY: 156 mg/dL — AB (ref 65–99)

## 2016-08-07 DIAGNOSIS — E119 Type 2 diabetes mellitus without complications: Secondary | ICD-10-CM | POA: Diagnosis not present

## 2016-08-07 LAB — GLUCOSE, CAPILLARY: GLUCOSE-CAPILLARY: 154 mg/dL — AB (ref 65–99)

## 2016-08-08 DIAGNOSIS — E119 Type 2 diabetes mellitus without complications: Secondary | ICD-10-CM | POA: Diagnosis not present

## 2016-08-08 LAB — GLUCOSE, CAPILLARY: Glucose-Capillary: 167 mg/dL — ABNORMAL HIGH (ref 65–99)

## 2016-08-09 DIAGNOSIS — E119 Type 2 diabetes mellitus without complications: Secondary | ICD-10-CM | POA: Diagnosis not present

## 2016-08-09 LAB — GLUCOSE, CAPILLARY: Glucose-Capillary: 151 mg/dL — ABNORMAL HIGH (ref 65–99)

## 2016-08-10 ENCOUNTER — Encounter: Payer: Self-pay | Admitting: Family Medicine

## 2016-08-10 DIAGNOSIS — E119 Type 2 diabetes mellitus without complications: Secondary | ICD-10-CM | POA: Diagnosis not present

## 2016-08-10 LAB — GLUCOSE, CAPILLARY: GLUCOSE-CAPILLARY: 189 mg/dL — AB (ref 65–99)

## 2016-08-11 DIAGNOSIS — E119 Type 2 diabetes mellitus without complications: Secondary | ICD-10-CM | POA: Diagnosis not present

## 2016-08-11 LAB — GLUCOSE, CAPILLARY: Glucose-Capillary: 183 mg/dL — ABNORMAL HIGH (ref 65–99)

## 2016-08-12 DIAGNOSIS — E119 Type 2 diabetes mellitus without complications: Secondary | ICD-10-CM | POA: Diagnosis not present

## 2016-08-12 LAB — GLUCOSE, CAPILLARY: GLUCOSE-CAPILLARY: 193 mg/dL — AB (ref 65–99)

## 2016-08-13 DIAGNOSIS — E119 Type 2 diabetes mellitus without complications: Secondary | ICD-10-CM | POA: Diagnosis not present

## 2016-08-13 LAB — GLUCOSE, CAPILLARY: GLUCOSE-CAPILLARY: 166 mg/dL — AB (ref 65–99)

## 2016-08-16 DIAGNOSIS — E119 Type 2 diabetes mellitus without complications: Secondary | ICD-10-CM | POA: Diagnosis not present

## 2016-08-16 LAB — GLUCOSE, CAPILLARY
GLUCOSE-CAPILLARY: 164 mg/dL — AB (ref 65–99)
GLUCOSE-CAPILLARY: 173 mg/dL — AB (ref 65–99)
GLUCOSE-CAPILLARY: 160 mg/dL — AB (ref 65–99)

## 2016-08-17 DIAGNOSIS — E119 Type 2 diabetes mellitus without complications: Secondary | ICD-10-CM | POA: Diagnosis not present

## 2016-08-17 LAB — GLUCOSE, CAPILLARY: Glucose-Capillary: 250 mg/dL — ABNORMAL HIGH (ref 65–99)

## 2016-08-18 ENCOUNTER — Non-Acute Institutional Stay (SKILLED_NURSING_FACILITY): Payer: Medicare Other | Admitting: Gerontology

## 2016-08-18 DIAGNOSIS — H6123 Impacted cerumen, bilateral: Secondary | ICD-10-CM | POA: Diagnosis not present

## 2016-08-18 DIAGNOSIS — M545 Low back pain, unspecified: Secondary | ICD-10-CM

## 2016-08-18 DIAGNOSIS — M792 Neuralgia and neuritis, unspecified: Secondary | ICD-10-CM | POA: Diagnosis not present

## 2016-08-18 DIAGNOSIS — E119 Type 2 diabetes mellitus without complications: Secondary | ICD-10-CM | POA: Diagnosis not present

## 2016-08-18 LAB — GLUCOSE, CAPILLARY: GLUCOSE-CAPILLARY: 192 mg/dL — AB (ref 65–99)

## 2016-08-18 NOTE — Progress Notes (Signed)
Location:      Place of Service:  SNF (31)  Provider: Lorenso QuarryShannon Arlone Lenhardt, NP-C  PCP: Megan Mansichard Gilbert Jr, MD Patient Care Team: Maple Hudsonichard L Gilbert Jr., MD as PCP - General (Unknown Physician Specialty) Galen ManilaWilliam Porfilio, MD as Referring Physician (Ophthalmology) Lonell FaceHemang K Shah, MD as Consulting Physician (Neurology) Recardo EvangelistMatthew Troxler, DPM as Attending Physician (Podiatry) Donette Larryharles Branch, MD as Referring Physician (Neurosurgery) Deirdre Eveneravid C Kowalski, MD as Consulting Physician (Dermatology)  Extended Emergency Contact Information Primary Emergency Contact: Sharolyn DouglasQueen,Bobbie          [place], [place] Home Phone: [phone] Relation: Sister Secondary Emergency Contact: Jettie PaganSwain,Billy  [place] Home Phone: [phone] Mobile Phone: [phone] Relation: Friend  Code Status: full Goals of care:  Advanced Directive information Advanced Directives 04/20/2016  Does Patient Have a Medical Advance Directive? Yes  Type of Advance Directive Living will;Healthcare Power of Attorney  Copy of Healthcare Power of Attorney in Chart? No - copy requested  Pre-existing out of facility DNR order (yellow form or pink MOST form) -     Allergies  Allergen Reactions  . Acetaminophen Itching and Other (See Comments)    Stomach upset  . Aspirin Itching and Other (See Comments)    Stomach pain  . Other Other (See Comments)    Novocaine, strange feeling  . Sulfa Antibiotics Other (See Comments)  . Tizanidine     Diarrhea, almost passed out, felt dizzy and confused  . Caffeine Palpitations  . Ginger Palpitations  . Ibuprofen Rash    Chief Complaint  Patient presents with  . Discharge Note    HPI:  83 y.o. female seen today for discharge evaluation. Pt was admitted to the facility for rehab following [facility] for Lumbar Fusion. She has been receiving PT/OT services while here and has been progressing well. She still cannot go home by herself,  though. She will be going home with caregivers for ~10 hours during the day and will be in the wheelchair or bed for the remainder of the time. She is still unsteady on her feet, but PT feels she is safe to discharge home since she will have assistance. Also, [facility] will be seeing her in her apartment tomorrow for a home evaluation visit. She reports her back pain is controlled, but still c/o severe neuropathic pain in the feet. She is upset her gabapentin was decreased, but seems understanding when explained that it was due to her renal function. She is agreeable to trying Lyrica for the neuropathy. She also reports decreased hearing and feels as if her ears are impacted. Will order debrox for home use. May need to [facility] flush her ears for her at completion of the treatment. Otherwise, pt is verbalizing she is ready to go home and is looking forward to the transition. VSS. No other complaints.      Past Medical History:  Diagnosis Date  . Anemia    WAS ANEMIC  . Arthritis   . Blood transfusion    HAD 1 SEVERAL YRS AGO...3-4 YR  . Chronic kidney disease    SOME DECREASE IN KIDNEY FUNCTION  . Diabetes mellitus without complication (HCC)   . Hypertension   . Normal cardiac stress test    REQUESTING COPY FROM [facility]  . Normal echocardiogram    REQUESTING THAT RESULT -- DR Park BreedKAHN   . PONV (postoperative nausea and vomiting)   . Shortness of breath   . Spondylolysis 1998  Past Surgical History:  Procedure Laterality Date  . ABDOMINAL HYSTERECTOMY    . BACK SURGERY     2 CERVICAL SURGERIES ALSO  . CATARACTS     RIGHT EYE CATARACT  . CERVICAL DISCECTOMY  08/1995 and 2011   C4-,C5-6, C6-7 anterior cervical; Dr. Danielle Dess fusion and plating  . LAMINECTOMY     decompressive; Dr. Polo Riley in [place]  . LUMBAR LAMINECTOMY/DECOMPRESSION MICRODISCECTOMY  04/14/2011   Procedure: LUMBAR LAMINECTOMY/DECOMPRESSION MICRODISCECTOMY;  Surgeon: Cristi Loron;   Location: [facility];  Service: Neurosurgery;  Laterality: N/A;  Lumbar Three and Lumbar Four Laminectomy  . TONSILLECTOMY AND ADENOIDECTOMY  1939      reports that she has never smoked. She has never used smokeless tobacco. She reports that she does not drink alcohol or use drugs. Social History   Social History  . Marital status: Married    Spouse name: N/A  . Number of children: N/A  . Years of education: N/A   Occupational History  . Not on file.   Social History Main Topics  . Smoking status: Never Smoker  . Smokeless tobacco: Never Used  . Alcohol use No  . Drug use: No  . Sexual activity: No   Other Topics Concern  . Not on file   Social History Narrative  . No narrative on file   Functional Status Survey:    Allergies  Allergen Reactions  . Acetaminophen Itching and Other (See Comments)    Stomach upset  . Aspirin Itching and Other (See Comments)    Stomach pain  . Other Other (See Comments)    Novocaine, strange feeling  . Sulfa Antibiotics Other (See Comments)  . Tizanidine     Diarrhea, almost passed out, felt dizzy and confused  . Caffeine Palpitations  . Ginger Palpitations  . Ibuprofen Rash    Pertinent  Health Maintenance Due  Topic Date Due  . PNA vac Low Risk Adult (2 of 2 - PPSV23) 04/28/2016  . HEMOGLOBIN A1C  08/26/2016  . OPHTHALMOLOGY EXAM  09/05/2016  . FOOT EXAM  01/05/2017  . INFLUENZA VACCINE  Completed  . DEXA SCAN  Completed    Medications: Allergies as of 08/18/2016      Reactions   Acetaminophen Itching, Other (See Comments)   Stomach upset   Aspirin Itching, Other (See Comments)   Stomach pain   Other Other (See Comments)   Novocaine, strange feeling   Sulfa Antibiotics Other (See Comments)   Tizanidine    Diarrhea, almost passed out, felt dizzy and confused   Caffeine Palpitations   Ginger Palpitations   Ibuprofen Rash      Medication List       Accurate as of 08/18/16  1:29 PM. Always use your most recent  med list.          aspirin EC 81 MG tablet Take by mouth.   CALCIUM 500 PO Take by mouth.   Co Q10 200 MG Caps Take by mouth.   DULoxetine 20 MG capsule Commonly known as:  CYMBALTA Take 1 capsule (20 mg total) by mouth daily.   estrogens (conjugated) 0.3 MG tablet Commonly known as:  PREMARIN TAKE 1 TABLET BY MOUTH EVERY DAY FOR 21 DAYS THEN DO NOT TAKE FOR 7 DAYS   fish oil-omega-3 fatty acids 1000 MG capsule Take 1 g by mouth 2 (two) times daily.   gabapentin 800 MG tablet Commonly known as:  NEURONTIN Take 1 tablet (800 mg total) by mouth 3 (three)  times daily.   HYDROcodone-acetaminophen 5-325 MG tablet Commonly known as:  NORCO/VICODIN Take 0.5-1 tablets by mouth every 4 (four) hours as needed for moderate pain.   hydroxypropyl methylcellulose / hypromellose 2.5 % ophthalmic solution Commonly known as:  ISOPTO TEARS / GONIOVISC Place 1 drop into both eyes 3 (three) times daily as needed. Dry eyes   hydrOXYzine 10 MG tablet Commonly known as:  ATARAX/VISTARIL Take by mouth.   lidocaine 5 % Commonly known as:  LIDODERM Place 1 patch onto the skin daily. Remove & Discard patch within 12 hours or as directed by MD   losartan 50 MG tablet Commonly known as:  COZAAR Take 1 tablet (50 mg total) by mouth daily.   MAGNESIUM-OXIDE 400 (241.3 Mg) MG tablet Generic drug:  magnesium oxide TAKE 1 TABLET BY MOUTH TWICE DAILY   MULTIPLE VITAMIN PO Take by mouth.   NEXIUM 40 MG capsule Generic drug:  esomeprazole Take by mouth.   rOPINIRole 1 MG tablet Commonly known as:  REQUIP Take 1 mg by mouth. 1/2 tablet four times daily.   tiZANidine 4 MG tablet Commonly known as:  ZANAFLEX TAKE 1 TABLET(4 MG) BY MOUTH EVERY 8 HOURS AS NEEDED FOR MUSCLE SPASMS       Review of Systems  Constitutional: Negative for activity change, appetite change, chills, diaphoresis and fever.  HENT: Negative for congestion, sneezing, sore throat, trouble swallowing and voice  change.   Eyes: Negative for pain, redness and visual disturbance.  Respiratory: Negative for apnea, cough, choking, chest tightness, shortness of breath and wheezing.   Cardiovascular: Negative for chest pain, palpitations and leg swelling.  Gastrointestinal: Negative for abdominal distention, abdominal pain, constipation, diarrhea and nausea.  Genitourinary: Negative for difficulty urinating, dysuria, frequency and urgency.  Musculoskeletal: Negative for back pain, gait problem and myalgias. Arthralgias: typical arthritis.  Skin: Negative for color change, pallor, rash and wound.  Neurological: Negative for dizziness, tremors, syncope, speech difficulty, weakness, numbness and headaches.  Psychiatric/Behavioral: Negative for agitation and behavioral problems.  All other systems reviewed and are negative.   Vitals:   08/16/16 0150  BP: (!) 143/70  Pulse: 92  Resp: 16  Temp: 98 F (36.7 C)  SpO2: 92%  Weight: 164 lb 6.4 oz (74.6 kg)   Body mass index is 26.53 kg/m. Physical Exam  Constitutional: She is oriented to person, place, and time. Vital signs are normal. She appears well-developed and well-nourished. She is active and cooperative. She does not appear ill. No distress.  HENT:  Head: Normocephalic and atraumatic.  Mouth/Throat: Uvula is midline, oropharynx is clear and moist and mucous membranes are normal. Mucous membranes are not pale, not dry and not cyanotic.  Eyes: Conjunctivae, EOM and lids are normal. Pupils are equal, round, and reactive to light.  Neck: Trachea normal, normal range of motion and full passive range of motion without pain. Neck supple. No JVD present. No tracheal deviation, no edema and no erythema present. No thyromegaly present.  Cardiovascular: Normal rate, regular rhythm, intact distal pulses and normal pulses.  Exam reveals no gallop, no distant heart sounds and no friction rub.   No murmur heard. Pulmonary/Chest: Effort normal and breath sounds  normal. No accessory muscle usage. No respiratory distress. She has no wheezes. She has no rales. She exhibits no tenderness.  Abdominal: Normal appearance and bowel sounds are normal. She exhibits no distension and no ascites. There is no tenderness. There is no CVA tenderness.  Musculoskeletal: Normal range of motion. She exhibits no  edema or tenderness.  Expected osteoarthritis, stiffness  Neurological: She is alert and oriented to person, place, and time. She has normal strength.  Skin: Skin is warm, dry and intact. No rash noted. She is not diaphoretic. No cyanosis or erythema. No pallor. Nails show no clubbing.  Psychiatric: She has a normal mood and affect. Her speech is normal and behavior is normal. Judgment and thought content normal. Cognition and memory are normal.  Nursing note and vitals reviewed.   Labs reviewed: Basic Metabolic Panel:  Recent Labs  16/10/96 1141 03/23/16 1603 07/02/16 1655  NA 143 139 141  K 5.2 5.5* 5.2  CL 101 95* 98  CO2 26 31* 27  GLUCOSE 174* 157* 149*  BUN 19 29* 17  CREATININE 1.18* 1.41* 0.94  CALCIUM 9.7 10.3 9.9  PHOS  --  3.2  --    Liver Function Tests:  Recent Labs  02/27/16 1141 03/23/16 1603 07/02/16 1655  AST 27  --  23  ALT 22  --  16  ALKPHOS 69  --  82  BILITOT 0.7  --  0.5  PROT 6.8  --  6.4  ALBUMIN 4.1 4.0 3.9   No results for input(s): LIPASE, AMYLASE in the last 8760 hours. No results for input(s): AMMONIA in the last 8760 hours. CBC:  Recent Labs  02/27/16 1141 03/23/16 1603 07/02/16 1655  WBC 7.6 11.2* 8.3  NEUTROABS 2.9 5.4  --   HCT 31.4* 31.9* 29.8*  MCV 114* 112* 110*  PLT 288 286 290   Cardiac Enzymes: No results for input(s): CKTOTAL, CKMB, CKMBINDEX, TROPONINI in the last 8760 hours. BNP: Invalid input(s): POCBNP CBG:  Recent Labs  08/16/16 0503 08/17/16 0521 08/18/16 0645  GLUCAP 160* 250* 192*    Procedures and Imaging Studies During Stay: No results found.  Assessment/Plan:     1. Spine pain, lumbar  Continue Tylenol 1000 mg po Q 6 hours prn for pain  Continue Oxycodone 5 mg- 1/2- 1 tablet po Q 6 hours prn pain- RX written for #15, no refill  Follow up with surgeon and pcp asap after discharge for continuity of care   2. Neuropathic pain  Pt is already on greater than maximum dose for Gabapentin d/t renal adjustment- 600 mg po QID  Continue Cymbalta 40 mg po Q Day  Continue Requip 0.5 mg po QID   Add Lyrica 25 mg po BID  3. Bilateral impacted cerumen  Debrox drops- fill ear canals with drops BID x 4 days  Flush ears with warm water at completion of treatment  May need to be done by Lawson Fiscal in the [facility]  Patient is being discharged with the following home health services:  [facility] PT/OT through [facility]  Patient is being discharged with the following durable medical equipment: wheelchair, gait belt and walker   Patient has been advised to f/u with their PCP in 1-2 weeks to bring them up to date on their rehab stay.  Social services at facility was responsible for arranging this appointment.  Pt was provided with a 30 day supply of prescriptions for medications and refills must be obtained from their PCP.  For controlled substances, a more limited supply may be provided adequate until PCP appointment only.  Future labs/tests needed:    Family/ staff Communication:   Total Time:  Documentation:  Face to Face:  Family/Phone:  Brynda Rim, NP-C [facility] [address], [place] [address]  Cell Phone (Mon-Fri 8am-5pm):  [phone] On Call:  [phone] & follow prompts after 5pm & weekends Office Phone:  [phone] Office Fax:  [phone]

## 2016-08-24 ENCOUNTER — Ambulatory Visit (INDEPENDENT_AMBULATORY_CARE_PROVIDER_SITE_OTHER): Payer: Medicare Other | Admitting: Family Medicine

## 2016-08-24 VITALS — BP 122/54 | HR 98 | Temp 97.9°F | Resp 16 | Wt 161.0 lb

## 2016-08-24 DIAGNOSIS — E118 Type 2 diabetes mellitus with unspecified complications: Secondary | ICD-10-CM

## 2016-08-24 DIAGNOSIS — D649 Anemia, unspecified: Secondary | ICD-10-CM | POA: Diagnosis not present

## 2016-08-24 DIAGNOSIS — Z981 Arthrodesis status: Secondary | ICD-10-CM | POA: Diagnosis not present

## 2016-08-24 DIAGNOSIS — G629 Polyneuropathy, unspecified: Secondary | ICD-10-CM

## 2016-08-24 DIAGNOSIS — G8929 Other chronic pain: Secondary | ICD-10-CM

## 2016-08-24 DIAGNOSIS — R2681 Unsteadiness on feet: Secondary | ICD-10-CM

## 2016-08-24 DIAGNOSIS — H538 Other visual disturbances: Secondary | ICD-10-CM

## 2016-08-24 DIAGNOSIS — R5383 Other fatigue: Secondary | ICD-10-CM

## 2016-08-24 DIAGNOSIS — R0789 Other chest pain: Secondary | ICD-10-CM | POA: Diagnosis not present

## 2016-08-24 LAB — POCT GLYCOSYLATED HEMOGLOBIN (HGB A1C): HEMOGLOBIN A1C: 7.5

## 2016-08-24 NOTE — Progress Notes (Signed)
Renee Carpenter  MRN: [id number] DOB: 06/13/1925  Subjective:  HPI  Patient is here to follow up after rehab. She had lumbar spinal fusion on 07/12/16 and then went to rehab at [facility] and was discharged from there on 08/18/16. She is doing better with her back, no pain except some pain/discomfort in the lower left back area near buttocks. She is trying to walk daily with the use of a walker. She is able to stand well but when tries to walk her legs do not move well and her feet start to turn inward, left leg feels weak. She has constant bilateral foot pain, numbness and tingling and is taking Gabapentin and was started on Lyrica which is helping her some. She is not taking Oxycodone or Norco or using lidocaine patches, she tries to just take Acetaminophen for pain.  She is also due to for A1C check. She is not checking sugar at home. Lab Results  Component Value Date   HGBA1C 7.7 (H) 02/27/2016    Patient Active Problem List   Diagnosis Date Noted  . Chronic kidney disease, stage 3, mod decreased GFR 04/15/2016  . Ds DNA antibody positive 04/15/2016  . Radicular pain of both lower extremities 04/13/2016  . Spine pain, lumbar 04/13/2016  . ANA positive 04/06/2016  . Right upper quadrant pain 09/11/2015  . Neuropathic pain 04/15/2015  . Cervical pain 01/24/2015  . Depression 01/01/2015  . Back pain, chronic 12/14/2014  . COPD, mild (HCC) 12/14/2014  . Anxiety, generalized 12/14/2014  . Esophageal reflux 12/14/2014  . Diabetes (HCC) 12/14/2014  . Progeria syndrome 12/14/2014  . BP (high blood pressure) 12/14/2014  . Adaptive colitis 12/14/2014  . Cannot sleep 12/14/2014  . Affective disorder, major 12/14/2014  . Chronic anemia 12/14/2014  . Arthritis, degenerative 12/14/2014  . OP (osteoporosis) 12/14/2014  . Restless leg 12/14/2014  . Neuralgia neuritis, sciatic nerve 12/14/2014  . 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 Absolute anemia  03/03/2012  . Lumbar canal stenosis 02/10/2012  . DDD (degenerative disc disease), lumbosacral 11/19/2011  . Neuropathy (HCC) 02/26/2008  . Essential hypertension 02/26/2008  . Fatigue 02/26/2008  . DYSPNEA 02/26/2008    Past Medical History:  Diagnosis Date  . Anemia    WAS ANEMIC  . Arthritis   . Blood transfusion    HAD 1 SEVERAL YRS AGO...3-4 YR  . Chronic kidney disease    SOME DECREASE IN KIDNEY FUNCTION  . Diabetes mellitus without complication (HCC)   . Hypertension   . Normal cardiac stress test    REQUESTING COPY FROM [facility]  . Normal echocardiogram    REQUESTING THAT RESULT -- DR Park BreedKAHN   . PONV (postoperative nausea and vomiting)   . Shortness of breath   . Spondylolysis 1998    Social History   Social History  . Marital status: Married    Spouse name: N/A  . Number of children: N/A  . Years of education: N/A   Occupational History  . Not on file.   Social History Main Topics  . Smoking status: Never Smoker  . Smokeless tobacco: Never Used  . Alcohol use No  . Drug use: No  . Sexual activity: No   Other Topics Concern  . Not on file   Social History Narrative  . No narrative on file    Outpatient Encounter Prescriptions as of 08/24/2016  Medication Sig Note  . acetaminophen (TYLENOL) 500 MG tablet Take 500 mg by mouth every 6 (  six) hours as needed.   Marland Kitchen aspirin EC 81 MG tablet Take by mouth. 01/01/2015: Received from: [facility]  . Calcium-Magnesium-Vitamin D (CALCIUM 500 PO) Take by mouth. 12/14/2014: Received from: [facility]  . Coenzyme Q10 (CO Q10) 200 MG CAPS Take by mouth. 12/14/2014: Received from: [facility]  . DULoxetine (CYMBALTA) 20 MG capsule Take 1 capsule (20 mg total) by mouth daily.   Marland Kitchen esomeprazole (NEXIUM) 40 MG capsule Take by mouth. 12/14/2014: Received from: [facility]  . estrogens, conjugated, (PREMARIN) 0.3 MG tablet TAKE 1 TABLET BY MOUTH EVERY  DAY FOR 21 DAYS THEN DO NOT TAKE FOR 7 DAYS   . fish oil-omega-3 fatty acids 1000 MG capsule Take 1 g by mouth 2 (two) times daily.     Marland Kitchen gabapentin (NEURONTIN) 800 MG tablet Take 1 tablet (800 mg total) by mouth 3 (three) times daily.   . hydroxypropyl methylcellulose (ISOPTO TEARS) 2.5 % ophthalmic solution Place 1 drop into both eyes 3 (three) times daily as needed. Dry eyes    . losartan (COZAAR) 50 MG tablet Take 1 tablet (50 mg total) by mouth daily.   Marland Kitchen LYRICA 25 MG capsule Take 1 capsule by mouth daily.   Marland Kitchen MAGNESIUM-OXIDE 400 (241.3 Mg) MG tablet TAKE 1 TABLET BY MOUTH TWICE DAILY   . MULTIPLE VITAMIN PO Take by mouth. 12/14/2014: Received from: [facility]  . rOPINIRole (REQUIP) 1 MG tablet Take 1 mg by mouth. 1/2 tablet four times daily. 12/14/2014: Received from: [facility]  . senna-docusate (SENOKOT-S) 8.6-50 MG tablet Take 1 tablet by mouth daily as needed.   . tamsulosin (FLOMAX) 0.4 MG CAPS capsule Take 0.4 mg by mouth daily.   . urea (CARMOL) 40 % CREA Apply topically.   Marland Kitchen oxyCODONE (OXY IR/ROXICODONE) 5 MG immediate release tablet TK SS TO ONE T PO Q 6 H PRN P   . [DISCONTINUED] HYDROcodone-acetaminophen (NORCO/VICODIN) 5-325 MG tablet Take 0.5-1 tablets by mouth every 4 (four) hours as needed for moderate pain.   . [DISCONTINUED] hydrOXYzine (ATARAX/VISTARIL) 10 MG tablet Take by mouth. 12/14/2014: Medication taken as needed.  Received from: [facility]  . [DISCONTINUED] lidocaine (LIDODERM) 5 % Place 1 patch onto the skin daily. Remove & Discard patch within 12 hours or as directed by MD   . [DISCONTINUED] tiZANidine (ZANAFLEX) 4 MG tablet TAKE 1 TABLET(4 MG) BY MOUTH EVERY 8 HOURS AS NEEDED FOR MUSCLE SPASMS    No facility-administered encounter medications on file as of 08/24/2016.     Allergies  Allergen Reactions  . Acetaminophen Itching and Other (See Comments)    Stomach upset  . Aspirin Itching and Other (See  Comments)    Stomach pain  . Other Other (See Comments)    Novocaine, strange feeling  . Sulfa Antibiotics Other (See Comments)  . Tizanidine     Diarrhea, almost passed out, felt dizzy and confused  . Caffeine Palpitations  . Ginger Palpitations  . Ibuprofen Rash    Review of Systems  Constitutional: Positive for malaise/fatigue.  HENT:       Ears stopped up  Eyes: Positive for blurred vision.  Respiratory: Negative.   Cardiovascular: Negative.   Gastrointestinal: Negative.   Genitourinary: Positive for frequency.  Musculoskeletal: Positive for back pain (lower left area near buttocks at times), joint pain (feet), myalgias (top of the chest) and neck pain (neck stiffness).  Skin: Negative.   Neurological: Positive for dizziness (yesterday), tingling (numbness in  both feet) and weakness (left leg).  Endo/Heme/Allergies: Negative.     Objective:  BP (!) 122/54   Pulse 98   Temp 97.9 F (36.6 C)   Resp 16   Wt 161 lb (73 kg)   BMI 25.99 kg/m   Physical Exam  Constitutional: She is oriented to person, place, and time and well-developed, well-nourished, and in no distress.  HENT:  Head: Normocephalic and atraumatic.  Right Ear: External ear normal.  Left Ear: External ear normal.  Eyes: Conjunctivae are normal. Pupils are equal, round, and reactive to light.  Cardiovascular: Normal rate, regular rhythm, normal heart sounds and intact distal pulses.   No murmur heard. Pulmonary/Chest: Effort normal and breath sounds normal. No respiratory distress. She has no wheezes.  Abdominal: Soft.  Musculoskeletal: She exhibits tenderness (along the muscle of chest wall).  Using a wheelchair for ambulation. Increased thoracic kyphosis.  Neurological: She is alert and oriented to person, place, and time. GCS score is 15.  Skin: Skin is warm and dry.  Fair skin.   Assessment and Plan :  1. Status post lumbar spinal fusion Doing well post surgery. Encouraged patient to continue  walking and doing therapy as much as she can. Advised patient recovery 100% will take time.  2. Neuropathy (HCC) Has issues in her feet. Follow .Gabapentin and Lyrica helping symptoms. May consider stopping Cymbalta in the future. 3. Other chronic pain Resolved in her back at this time. Pain in feet still present.  4. Unsteady gait Using a wheelchair today and uses a walker at home to help ambulate for now.  5. Type 2 diabetes mellitus with complication, without long-term current use of insulin (HCC) A1C 7.5. Better. Continue to follow. - POCT HgB A1C  6. Other fatigue  7. Chronic anemia  8. Muscular chest pain I think this is muscular not cardiac. Follow for now.  HPI, Exam and A&P transcribed under direction and in the presence of Renee Manson, MD. I have done the exam and reviewed the chart and it is accurate to the best of my knowledge. Dentist has been used and  any errors in dictation or transcription are unintentional. Renee Carpenter M.D. [facility]

## 2016-09-16 ENCOUNTER — Other Ambulatory Visit: Payer: Self-pay | Admitting: Family Medicine

## 2016-09-21 ENCOUNTER — Other Ambulatory Visit: Payer: Self-pay | Admitting: Family Medicine

## 2016-09-21 DIAGNOSIS — G629 Polyneuropathy, unspecified: Secondary | ICD-10-CM

## 2016-09-21 NOTE — Telephone Encounter (Addendum)
[facility] pharmacy faxed a request on the following medications,but [facility] faxed a prescription on Gabapentin 600 MG on the medication. We have in the our system 800 MG for the same medication but which MG is correct ? Thanks CC  gabapentin (NEURONTIN) 800 MG tablet  Take 1 tablet by mouth four times daily.  tamsulosin (FLOMAX) 0.4 MG CAPS capsule  Take 1 capsule by mouth every day.

## 2016-09-21 NOTE — Telephone Encounter (Signed)
Also there is another message about this, verification of the dose-aa

## 2016-09-21 NOTE — Telephone Encounter (Signed)
Please review-aa 

## 2016-09-21 NOTE — Telephone Encounter (Signed)
Pt called needing refills for   tamsulosin (FLOMAX) 0.4 MG CAPS capsule  gabapentin (NEURONTIN) 800 MG tablet  [facility] [address]  Thanks teri

## 2016-09-22 MED ORDER — GABAPENTIN 600 MG PO TABS: 600.0000 mg | ORAL_TABLET | Freq: Three times a day (TID) | ORAL | 3 refills | Status: DC

## 2016-09-22 NOTE — Telephone Encounter (Signed)
RX sent in for Gabapentin 600 mg TID. Is it ok to refill Tmasulosin? This was started to her during last surgery for back and recovery to help with urinary issues. She said it was working on her last visit.-aa

## 2016-09-22 NOTE — Telephone Encounter (Signed)
Lower dose?

## 2016-09-23 MED ORDER — TAMSULOSIN HCL 0.4 MG PO CAPS: 0.4000 mg | ORAL_CAPSULE | Freq: Every day | ORAL | 1 refills | Status: DC

## 2016-09-23 NOTE — Telephone Encounter (Signed)
Ok for now--may stop on next OV.

## 2016-09-23 NOTE — Telephone Encounter (Signed)
Refilled-aa 

## 2016-09-28 NOTE — Telephone Encounter (Signed)
Ok to rf all. 

## 2016-10-12 ENCOUNTER — Encounter: Payer: Self-pay | Admitting: Family Medicine

## 2016-10-12 ENCOUNTER — Ambulatory Visit (INDEPENDENT_AMBULATORY_CARE_PROVIDER_SITE_OTHER): Payer: Medicare Other | Admitting: Family Medicine

## 2016-10-12 VITALS — BP 128/58 | HR 80 | Temp 98.2°F | Resp 16 | Wt 169.0 lb

## 2016-10-12 DIAGNOSIS — M545 Low back pain, unspecified: Secondary | ICD-10-CM

## 2016-10-12 DIAGNOSIS — G629 Polyneuropathy, unspecified: Secondary | ICD-10-CM | POA: Diagnosis not present

## 2016-10-12 DIAGNOSIS — K2100 Gastro-esophageal reflux disease with esophagitis, without bleeding: Secondary | ICD-10-CM

## 2016-10-12 DIAGNOSIS — I10 Essential (primary) hypertension: Secondary | ICD-10-CM

## 2016-10-12 MED ORDER — PREGABALIN 25 MG PO CAPS: 25.0000 mg | ORAL_CAPSULE | Freq: Every day | ORAL | 5 refills | Status: DC

## 2016-10-12 NOTE — Progress Notes (Signed)
Patient: Renee Carpenter Female    DOB: 08-11-1925   83 y.o.   MRN: [id number] Visit Date: 10/12/2016  Today's Provider: Megan Mansichard Gilbert Jr, MD   Chief Complaint  Patient presents with  . Follow-up   Subjective:    HPI Patient comes in today for a follow up. She was last seen in the office about 2 months ago. No changes were made in her medications. She reports that she has neuropathy and her pain has seemed to worsened over the last few weeks. She is not currently taking Lyrica due to side effects. She states, "I did not feel right on Lyrica". She is also S/P lumbar spinal fusion.       Allergies  Allergen Reactions  . Acetaminophen Itching and Other (See Comments)    Stomach upset  . Aspirin Itching and Other (See Comments)    Stomach pain  . Other Other (See Comments)    Novocaine, strange feeling  . Sulfa Antibiotics Other (See Comments)  . Tizanidine     Diarrhea, almost passed out, felt dizzy and confused  . Caffeine Palpitations  . Ginger Palpitations  . Ibuprofen Rash     Current Outpatient Prescriptions:  .  acetaminophen (TYLENOL) 500 MG tablet, Take 500 mg by mouth every 6 (six) hours as needed., Disp: , Rfl:  .  aspirin EC 81 MG tablet, Take by mouth., Disp: , Rfl:  .  Calcium-Magnesium-Vitamin D (CALCIUM 500 PO), Take by mouth., Disp: , Rfl:  .  Coenzyme Q10 (CO Q10) 200 MG CAPS, Take by mouth., Disp: , Rfl:  .  DULoxetine (CYMBALTA) 20 MG capsule, Take 1 capsule (20 mg total) by mouth daily., Disp: 30 capsule, Rfl: 3 .  esomeprazole (NEXIUM) 40 MG capsule, Take by mouth., Disp: , Rfl:  .  estrogens, conjugated, (PREMARIN) 0.3 MG tablet, TAKE 1 TABLET BY MOUTH EVERY DAY FOR 21 DAYS THEN DO NOT TAKE FOR 7 DAYS, Disp: 30 tablet, Rfl: 11 .  fish oil-omega-3 fatty acids 1000 MG capsule, Take 1 g by mouth 2 (two) times daily.  , Disp: , Rfl:  .  gabapentin (NEURONTIN) 600 MG tablet, Take 1 tablet (600 mg total) by mouth 3 (three) times daily., Disp: 270  tablet, Rfl: 3 .  hydroxypropyl methylcellulose (ISOPTO TEARS) 2.5 % ophthalmic solution, Place 1 drop into both eyes 3 (three) times daily as needed. Dry eyes , Disp: , Rfl:  .  losartan (COZAAR) 50 MG tablet, Take 1 tablet (50 mg total) by mouth daily., Disp: 90 tablet, Rfl: 3 .  LYRICA 25 MG capsule, Take 1 capsule by mouth daily., Disp: , Rfl: 0 .  MAGNESIUM-OXIDE 400 (241.3 Mg) MG tablet, TAKE 1 TABLET BY MOUTH TWICE DAILY, Disp: 60 tablet, Rfl: 12 .  MULTIPLE VITAMIN PO, Take by mouth., Disp: , Rfl:  .  oxyCODONE (OXY IR/ROXICODONE) 5 MG immediate release tablet, TK SS TO ONE T PO Q 6 H PRN P, Disp: , Rfl: 0 .  rOPINIRole (REQUIP) 1 MG tablet, Take 1 mg by mouth. 1/2 tablet four times daily., Disp: , Rfl:  .  senna-docusate (SENOKOT-S) 8.6-50 MG tablet, Take 1 tablet by mouth daily as needed., Disp: , Rfl:  .  tamsulosin (FLOMAX) 0.4 MG CAPS capsule, Take 1 capsule (0.4 mg total) by mouth daily., Disp: 90 capsule, Rfl: 1 .  urea (CARMOL) 40 % CREA, Apply topically., Disp: , Rfl:   Review of Systems  Respiratory: Positive for chest tightness.  Cardiovascular: Positive for leg swelling. Negative for chest pain and palpitations.  Musculoskeletal: Positive for arthralgias, back pain, gait problem, joint swelling and myalgias.  Neurological: Positive for weakness and numbness.    Social History  Substance Use Topics  . Smoking status: Never Smoker  . Smokeless tobacco: Never Used  . Alcohol use No   Objective:   BP (!) 128/58 (BP Location: Left Arm, Patient Position: Sitting, Cuff Size: Large)   Pulse 80   Temp 98.2 F (36.8 C)   Resp 16   Wt 169 lb (76.7 kg)   BMI 27.28 kg/m  Vitals:   10/12/16 1528  BP: (!) 128/58  Pulse: 80  Resp: 16  Temp: 98.2 F (36.8 C)  Weight: 169 lb (76.7 kg)     Physical Exam  Constitutional: She is oriented to person, place, and time. She appears well-developed and well-nourished.  Cardiovascular: Normal rate, regular rhythm and normal  heart sounds.   Musculoskeletal: She exhibits edema.  Neurological: She is alert and oriented to person, place, and time.  Skin: Skin is warm and dry.  Psychiatric: She has a normal mood and affect. Her behavior is normal. Judgment and thought content normal.  p02 is 98%      Assessment & Plan:     1. Essential hypertension  - Renal function panel  2. Neuropathy (HCC)  - pregabalin (LYRICA) 25 MG capsule; Take 1 capsule (25 mg total) by mouth daily.  Dispense: 30 capsule; Refill: 5  3. Spine pain, lumbar   4. Gastroesophageal reflux disease with esophagitis       I have done the exam and reviewed the chart and it is accurate to the best of my knowledge. Dentist has been used and  any errors in dictation or transcription are unintentional. Julieanne Manson M.D. [facility]   Megan Mans, MD  [facility]

## 2016-10-13 ENCOUNTER — Ambulatory Visit: Payer: Medicare Other | Admitting: Family Medicine

## 2016-10-13 LAB — RENAL FUNCTION PANEL
Albumin: 3.8 g/dL (ref 3.5–4.7)
BUN / CREAT RATIO: 17 (ref 12–28)
BUN: 19 mg/dL (ref 8–27)
CALCIUM: 8.8 mg/dL (ref 8.7–10.3)
CO2: 27 mmol/L (ref 18–29)
CREATININE: 1.11 mg/dL — AB (ref 0.57–1.00)
Chloride: 100 mmol/L (ref 96–106)
GFR calc Af Amer: 51 mL/min/{1.73_m2} — ABNORMAL LOW (ref >59)
GFR, EST NON AFRICAN AMERICAN: 44 mL/min/{1.73_m2} — AB (ref >59)
GLUCOSE: 218 mg/dL — AB (ref 65–99)
Phosphorus: 2.6 mg/dL (ref 2.5–4.5)
Potassium: 4.1 mmol/L (ref 3.5–5.2)
SODIUM: 139 mmol/L (ref 134–144)

## 2016-10-26 ENCOUNTER — Ambulatory Visit: Payer: Medicare Other | Admitting: Family Medicine

## 2016-11-16 ENCOUNTER — Ambulatory Visit (INDEPENDENT_AMBULATORY_CARE_PROVIDER_SITE_OTHER): Payer: Medicare Other | Admitting: Family Medicine

## 2016-11-16 ENCOUNTER — Encounter: Payer: Self-pay | Admitting: Family Medicine

## 2016-11-16 VITALS — BP 112/58 | HR 90 | Temp 98.1°F | Resp 16 | Wt 169.0 lb

## 2016-11-16 DIAGNOSIS — R110 Nausea: Secondary | ICD-10-CM

## 2016-11-16 DIAGNOSIS — D649 Anemia, unspecified: Secondary | ICD-10-CM | POA: Diagnosis not present

## 2016-11-16 DIAGNOSIS — R5383 Other fatigue: Secondary | ICD-10-CM

## 2016-11-16 DIAGNOSIS — N183 Chronic kidney disease, stage 3 unspecified: Secondary | ICD-10-CM

## 2016-11-16 DIAGNOSIS — I10 Essential (primary) hypertension: Secondary | ICD-10-CM

## 2016-11-16 DIAGNOSIS — R319 Hematuria, unspecified: Secondary | ICD-10-CM | POA: Diagnosis not present

## 2016-11-16 LAB — POCT URINALYSIS DIPSTICK
BILIRUBIN UA: NEGATIVE
GLUCOSE UA: NEGATIVE
Ketones, UA: NEGATIVE
Leukocytes, UA: NEGATIVE
Nitrite, UA: NEGATIVE
Protein, UA: NEGATIVE
SPEC GRAV UA: 1.01 (ref 1.010–1.025)
Urobilinogen, UA: 0.2 E.U./dL
pH, UA: 6 (ref 5.0–8.0)

## 2016-11-16 MED ORDER — RANITIDINE HCL 150 MG PO CAPS: 150.0000 mg | ORAL_CAPSULE | Freq: Two times a day (BID) | ORAL | 11 refills | Status: DC

## 2016-11-16 NOTE — Progress Notes (Signed)
Subjective:  HPI Pt is here for a 4 week follow up.  She was unsure what exactly she was here for but when reviewing medications she said that her Losartan was decreased to 25 mg because she was really light headed. She reports that she is not feeling lightheaded anymore and has actually been feeling pretty good. She is having some stomach discomfort. She says that her stomach feels like it is empty and real cold food and drink makes her stomach hurt. She either gets nauseated or she has a BM. She has been having diarrhea as well and had a couple of accidents. Also noted on last labs that renal needed to be repeated today.     Prior to Admission medications   Medication Sig Start Date End Date Taking? Authorizing Provider  acetaminophen (TYLENOL) 500 MG tablet Take 500 mg by mouth every 6 (six) hours as needed. 07/16/16  Yes [provider]  aspirin EC 81 MG tablet Take by mouth.   Yes [provider]  Calcium-Magnesium-Vitamin D (CALCIUM 500 PO) Take by mouth. 08/09/11  Yes [provider]  Coenzyme Q10 (CO Q10) 200 MG CAPS Take by mouth. 08/09/11  Yes [provider]  DULoxetine (CYMBALTA) 20 MG capsule Take 1 capsule (20 mg total) by mouth daily. 03/23/16  Yes Maple Hudson., MD  esomeprazole (NEXIUM) 40 MG capsule Take by mouth. 10/30/13  Yes [provider]  estrogens, conjugated, (PREMARIN) 0.3 MG tablet TAKE 1 TABLET BY MOUTH EVERY DAY FOR 21 DAYS THEN DO NOT TAKE FOR 7 DAYS 02/02/16  Yes Maple Hudson., MD  fish oil-omega-3 fatty acids 1000 MG capsule Take 1 g by mouth 2 (two) times daily.     Yes [provider]  gabapentin (NEURONTIN) 600 MG tablet Take 1 tablet (600 mg total) by mouth 3 (three) times daily. 09/22/16  Yes Maple Hudson., MD  hydroxypropyl methylcellulose (ISOPTO TEARS) 2.5 % ophthalmic solution Place 1 drop into both eyes 3 (three) times daily as needed. Dry eyes    Yes [provider]    losartan (COZAAR) 50 MG tablet Take 1 tablet (50 mg total) by mouth daily. 08/27/15  Yes Maple Hudson., MD  MAGNESIUM-OXIDE 400 (241.3 Mg) MG tablet TAKE 1 TABLET BY MOUTH TWICE DAILY 10/12/15  Yes Maple Hudson., MD  MULTIPLE VITAMIN PO Take by mouth. 08/09/11  Yes [provider]  oxyCODONE (OXY IR/ROXICODONE) 5 MG immediate release tablet TK SS TO ONE T PO Q 6 H PRN P 08/18/16  Yes [provider]  pregabalin (LYRICA) 25 MG capsule Take 1 capsule (25 mg total) by mouth daily. 10/12/16  Yes Maple Hudson., MD  rOPINIRole (REQUIP) 1 MG tablet Take 1 mg by mouth. 1/2 tablet four times daily. 10/10/12  Yes [provider]  senna-docusate (SENOKOT-S) 8.6-50 MG tablet Take 1 tablet by mouth daily as needed. 07/16/16  Yes [provider]  tamsulosin (FLOMAX) 0.4 MG CAPS capsule Take 1 capsule (0.4 mg total) by mouth daily. 09/23/16  Yes Maple Hudson., MD  urea (CARMOL) 40 % CREA Apply topically.   Yes [provider]    Patient Active Problem List   Diagnosis Date Noted  . Chronic kidney disease, stage 3, mod decreased GFR 04/15/2016  . Ds DNA antibody positive 04/15/2016  . Radicular pain of both lower extremities 04/13/2016  . Spine pain, lumbar 04/13/2016  . ANA positive 04/06/2016  .  Right upper quadrant pain 09/11/2015  . Neuropathic pain 04/15/2015  . Cervical pain 01/24/2015  . Depression 01/01/2015  . Back pain, chronic 12/14/2014  . COPD, mild (HCC) 12/14/2014  . Anxiety, generalized 12/14/2014  . Esophageal reflux 12/14/2014  . Diabetes (HCC) 12/14/2014  . Progeria syndrome 12/14/2014  . BP (high blood pressure) 12/14/2014  . Adaptive colitis 12/14/2014  . Cannot sleep 12/14/2014  . Affective disorder, major 12/14/2014  . Chronic anemia 12/14/2014  . Arthritis, degenerative 12/14/2014  . OP (osteoporosis) 12/14/2014  . Restless leg 12/14/2014  . Neuralgia neuritis, sciatic nerve 12/14/2014  . 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 Absolute anemia 03/03/2012  . Lumbar canal stenosis 02/10/2012  . DDD (degenerative disc disease), lumbosacral 11/19/2011  . Neuropathy (HCC) 02/26/2008  . Essential hypertension 02/26/2008  . Fatigue 02/26/2008  . DYSPNEA 02/26/2008    Past Medical History:  Diagnosis Date  . Anemia    WAS ANEMIC  . Arthritis   . Blood transfusion    HAD 1 SEVERAL YRS AGO...3-4 YR  . Chronic kidney disease    SOME DECREASE IN KIDNEY FUNCTION  . Diabetes mellitus without complication (HCC)   . Hypertension   . Normal cardiac stress test    REQUESTING COPY FROM [facility]  . Normal echocardiogram    REQUESTING THAT RESULT -- DR Park BreedKAHN   . PONV (postoperative nausea and vomiting)   . Shortness of breath   . Spondylolysis 1998    Social History   Social History  . Marital status: Married    Spouse name: N/A  . Number of children: N/A  . Years of education: N/A   Occupational History  . Not on file.   Social History Main Topics  . Smoking status: Never Smoker  . Smokeless tobacco: Never Used  . Alcohol use No  . Drug use: No  . Sexual activity: No   Other Topics Concern  . Not on file   Social History Narrative  . No narrative on file    Allergies  Allergen Reactions  . Acetaminophen Itching and Other (See Comments)    Stomach upset  . Aspirin Itching and Other (See Comments)    Stomach pain  . Other Other (See Comments)    Novocaine, strange feeling  . Sulfa Antibiotics Other (See Comments)  . Tizanidine     Diarrhea, almost passed out, felt dizzy and confused  . Caffeine Palpitations  . Ginger Palpitations  . Ibuprofen Rash    Review of Systems  Constitutional: Positive for malaise/fatigue.  HENT: Negative.   Eyes: Negative.   Respiratory: Negative.   Cardiovascular: Negative.   Gastrointestinal: Positive for diarrhea (stomach feels like it is empty).  Genitourinary: Negative.   Musculoskeletal:  Positive for back pain.  Skin: Negative.   Neurological: Negative.   Endo/Heme/Allergies: Negative.   Psychiatric/Behavioral: Negative.     Immunization History  Administered Date(s) Administered  . Influenza, High Dose Seasonal PF 04/29/2015, 04/20/2016  . Pneumococcal Conjugate-13 04/29/2015    Objective:  BP (!) 112/58 (BP Location: Left Arm, Patient Position: Sitting, Cuff Size: Normal)   Pulse 90   Temp 98.1 F (36.7 C) (Oral)   Resp 16   Wt 169 lb (76.7 kg)   BMI 27.28 kg/m   Physical Exam  Constitutional: She is oriented to person, place, and time and well-developed, well-nourished, and in no distress.  HENT:  Head: Normocephalic and atraumatic.  Right Ear: External ear normal.  Left Ear: External ear  normal.  Nose: Nose normal.  Eyes: Conjunctivae and EOM are normal. Pupils are equal, round, and reactive to light.  Neck: Normal range of motion. Neck supple.  Cardiovascular: Normal rate, regular rhythm, normal heart sounds and intact distal pulses.   Pulmonary/Chest: Effort normal and breath sounds normal.  Musculoskeletal: Normal range of motion.  Neurological: She is alert and oriented to person, place, and time. She has normal reflexes. Gait normal. GCS score is 15.  Skin: Skin is warm and dry.  Psychiatric: Mood, memory, affect and judgment normal.    Lab Results  Component Value Date   WBC 8.3 07/02/2016   HGB 10.3 (L) 07/02/2016   HCT 29.8 (L) 07/02/2016   PLT 290 07/02/2016   GLUCOSE 218 (H) 10/12/2016   CHOL 142 02/27/2016   TRIG 120 02/27/2016   HDL 64 02/27/2016   LDLCALC 54 02/27/2016   TSH 3.080 07/02/2016   HGBA1C 7.5 08/24/2016    CMP     Component Value Date/Time   NA 139 10/12/2016 1610   NA 139 02/06/2014 1958   K 4.1 10/12/2016 1610   K 3.9 02/06/2014 1958   CL 100 10/12/2016 1610   CL 103 02/06/2014 1958   CO2 27 10/12/2016 1610   CO2 30 02/06/2014 1958   GLUCOSE 218 (H) 10/12/2016 1610   GLUCOSE 123 (H) 02/06/2014 1958    BUN 19 10/12/2016 1610   BUN 20 (H) 02/06/2014 1958   CREATININE 1.11 (H) 10/12/2016 1610   CREATININE 1.12 02/06/2014 1958   CALCIUM 8.8 10/12/2016 1610   CALCIUM 9.8 02/06/2014 1958   PROT 6.4 07/02/2016 1655   PROT 7.5 02/06/2014 1958   ALBUMIN 3.8 10/12/2016 1610   ALBUMIN 3.9 02/06/2014 1958   AST 23 07/02/2016 1655   AST 31 02/06/2014 1958   ALT 16 07/02/2016 1655   ALT 31 02/06/2014 1958   ALKPHOS 82 07/02/2016 1655   ALKPHOS 76 02/06/2014 1958   BILITOT 0.5 07/02/2016 1655   BILITOT 0.5 02/06/2014 1958   GFRNONAA 44 (L) 10/12/2016 1610   GFRNONAA 44 (L) 02/06/2014 1958   GFRAA 51 (L) 10/12/2016 1610   GFRAA 52 (L) 02/06/2014 1958    Assessment and Plan :  1. Essential hypertension   2. Other fatigue  - CBC with Differential/Platelet  3. Chronic anemia  - CBC with Differential/Platelet  4. Hematuria, unspecified type  - POCT urinalysis dipstick - Urine Culture  5. Nausea  - ranitidine (ZANTAC) 150 MG capsule; Take 1 capsule (150 mg total) by mouth 2 (two) times daily.  Dispense: 60 capsule; Refill: 11 - Comprehensive metabolic panel - Lipase  6. Chronic kidney disease, stage 3, mod decreased GFR  - Comprehensive metabolic panel 7.Chronic Back Pain  HPI, Exam, and A&P Transcribed under the direction and in the presence of Maria Gallicchio L. Wendelyn Breslow, MD  Electronically Signed: Silvio Pate, CMA  Julieanne Manson MD [facility] 11/16/2016 3:11 PM

## 2016-11-17 LAB — CBC WITH DIFFERENTIAL/PLATELET
BASOS ABS: 0.1 10*3/uL (ref 0.0–0.2)
Basos: 1 %
EOS (ABSOLUTE): 0.2 10*3/uL (ref 0.0–0.4)
Eos: 2 %
Hematocrit: 30.2 % — ABNORMAL LOW (ref 34.0–46.6)
Hemoglobin: 10.2 g/dL — ABNORMAL LOW (ref 11.1–15.9)
Immature Grans (Abs): 0.1 10*3/uL (ref 0.0–0.1)
Immature Granulocytes: 1 %
LYMPHS ABS: 3.5 10*3/uL — AB (ref 0.7–3.1)
Lymphs: 41 %
MCH: 36.8 pg — ABNORMAL HIGH (ref 26.6–33.0)
MCHC: 33.8 g/dL (ref 31.5–35.7)
MCV: 109 fL — ABNORMAL HIGH (ref 79–97)
MONOS ABS: 0.7 10*3/uL (ref 0.1–0.9)
Monocytes: 9 %
Neutrophils Absolute: 4 10*3/uL (ref 1.4–7.0)
Neutrophils: 46 %
PLATELETS: 293 10*3/uL (ref 150–379)
RBC: 2.77 x10E6/uL — AB (ref 3.77–5.28)
RDW: 17.1 % — AB (ref 12.3–15.4)
WBC: 8.4 10*3/uL (ref 3.4–10.8)

## 2016-11-17 LAB — COMPREHENSIVE METABOLIC PANEL
ALT: 16 IU/L (ref 0–32)
AST: 20 IU/L (ref 0–40)
Albumin/Globulin Ratio: 1.5 (ref 1.2–2.2)
Albumin: 4 g/dL (ref 3.5–4.7)
Alkaline Phosphatase: 92 IU/L (ref 39–117)
BUN/Creatinine Ratio: 18 (ref 12–28)
BUN: 21 mg/dL (ref 8–27)
Bilirubin Total: 0.4 mg/dL (ref 0.0–1.2)
CO2: 25 mmol/L (ref 20–29)
CREATININE: 1.16 mg/dL — AB (ref 0.57–1.00)
Calcium: 9.4 mg/dL (ref 8.7–10.3)
Chloride: 101 mmol/L (ref 96–106)
GFR calc Af Amer: 49 mL/min/{1.73_m2} — ABNORMAL LOW (ref >59)
GFR calc non Af Amer: 42 mL/min/{1.73_m2} — ABNORMAL LOW (ref >59)
GLOBULIN, TOTAL: 2.7 g/dL (ref 1.5–4.5)
GLUCOSE: 179 mg/dL — AB (ref 65–99)
Potassium: 4.8 mmol/L (ref 3.5–5.2)
SODIUM: 142 mmol/L (ref 134–144)
Total Protein: 6.7 g/dL (ref 6.0–8.5)

## 2016-11-17 LAB — LIPASE: Lipase: 31 U/L (ref 14–85)

## 2016-11-18 ENCOUNTER — Telehealth: Payer: Self-pay

## 2016-11-18 LAB — URINE CULTURE: Organism ID, Bacteria: NO GROWTH

## 2016-11-18 LAB — PLEASE NOTE

## 2016-11-18 NOTE — Telephone Encounter (Signed)
Left message advising pt. Ok per DPR. Jacobi Nile Drozdowski, CMA  

## 2016-11-18 NOTE — Telephone Encounter (Signed)
-----   Message from Maple Hudsonichard L Gilbert Jr., MD sent at 11/18/2016  1:38 PM EDT ----- Stable.

## 2016-11-22 ENCOUNTER — Other Ambulatory Visit: Payer: Self-pay | Admitting: Family Medicine

## 2016-11-22 DIAGNOSIS — I10 Essential (primary) hypertension: Secondary | ICD-10-CM

## 2016-11-30 ENCOUNTER — Other Ambulatory Visit: Payer: Self-pay | Admitting: Family Medicine

## 2017-01-19 ENCOUNTER — Ambulatory Visit (INDEPENDENT_AMBULATORY_CARE_PROVIDER_SITE_OTHER): Payer: Medicare Other | Admitting: Family Medicine

## 2017-01-19 ENCOUNTER — Ambulatory Visit (INDEPENDENT_AMBULATORY_CARE_PROVIDER_SITE_OTHER): Payer: Medicare Other

## 2017-01-19 VITALS — BP 132/60 | HR 96 | Temp 99.0°F | Resp 18 | Wt 176.8 lb

## 2017-01-19 VITALS — BP 152/70 | HR 96 | Temp 99.0°F | Ht 66.0 in | Wt 176.8 lb

## 2017-01-19 DIAGNOSIS — Z0000 Encounter for general adult medical examination without abnormal findings: Secondary | ICD-10-CM | POA: Diagnosis not present

## 2017-01-19 DIAGNOSIS — G8929 Other chronic pain: Secondary | ICD-10-CM

## 2017-01-19 DIAGNOSIS — M5137 Other intervertebral disc degeneration, lumbosacral region: Secondary | ICD-10-CM

## 2017-01-19 DIAGNOSIS — R002 Palpitations: Secondary | ICD-10-CM

## 2017-01-19 DIAGNOSIS — D649 Anemia, unspecified: Secondary | ICD-10-CM | POA: Diagnosis not present

## 2017-01-19 DIAGNOSIS — R0683 Snoring: Secondary | ICD-10-CM

## 2017-01-19 DIAGNOSIS — I10 Essential (primary) hypertension: Secondary | ICD-10-CM

## 2017-01-19 DIAGNOSIS — R0681 Apnea, not elsewhere classified: Secondary | ICD-10-CM | POA: Diagnosis not present

## 2017-01-19 DIAGNOSIS — E118 Type 2 diabetes mellitus with unspecified complications: Secondary | ICD-10-CM

## 2017-01-19 NOTE — Progress Notes (Signed)
Renee Carpenter  MRN: [id number] DOB: 19-Feb-1926  Subjective:  HPI  Patient is here for follow up. Last office visit was in June. At that time nausea was addressed and patient was started on Ranitidine and she is still taking this. She did have one episode of having chest pressure radiating up to her throat after eating, lasted for 15 minutes. Never had this and has not had this happen since.  B/P: patient checks her b/p, readings 120/80-70. She has had some nights she would have palpitations and some nervousness feeling. Not sure if she was dreaming about something. She is able to fall back asleep with palpitation feeling and they go away when she wakes up. She does at the same time gasping for air. She does snore.  BP Readings from Last 3 Encounters:  01/19/17 132/60  01/19/17 (!) 152/70  11/16/16 (!) 112/58   Patient Active Problem List   Diagnosis Date Noted  . Chronic kidney disease, stage 3, mod decreased GFR 04/15/2016  . Ds DNA antibody positive 04/15/2016  . Radicular pain of both lower extremities 04/13/2016  . Spine pain, lumbar 04/13/2016  . ANA positive 04/06/2016  . Right upper quadrant pain 09/11/2015  . Neuropathic pain 04/15/2015  . Cervical pain 01/24/2015  . Depression 01/01/2015  . Back pain, chronic 12/14/2014  . COPD, mild (HCC) 12/14/2014  . Anxiety, generalized 12/14/2014  . Esophageal reflux 12/14/2014  . Diabetes (HCC) 12/14/2014  . Progeria syndrome 12/14/2014  . BP (high blood pressure) 12/14/2014  . Adaptive colitis 12/14/2014  . Cannot sleep 12/14/2014  . Affective disorder, major 12/14/2014  . Chronic anemia 12/14/2014  . Arthritis, degenerative 12/14/2014  . OP (osteoporosis) 12/14/2014  . Restless leg 12/14/2014  . Neuralgia neuritis, sciatic nerve 12/14/2014  . 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 Absolute anemia 03/03/2012  . Lumbar canal stenosis 02/10/2012  . DDD (degenerative disc disease),  lumbosacral 11/19/2011  . Neuropathy (HCC) 02/26/2008  . Essential hypertension 02/26/2008  . Fatigue 02/26/2008  . DYSPNEA 02/26/2008    Past Medical History:  Diagnosis Date  . Anemia    WAS ANEMIC  . Arthritis   . Blood transfusion    HAD 1 SEVERAL YRS AGO...3-4 YR  . Chronic kidney disease    SOME DECREASE IN KIDNEY FUNCTION  . Diabetes mellitus without complication (HCC)   . Hypertension   . Normal cardiac stress test    REQUESTING COPY FROM [facility]  . Normal echocardiogram    REQUESTING THAT RESULT -- DR Park BreedKAHN   . PONV (postoperative nausea and vomiting)   . Shortness of breath   . Spondylolysis 1998    Social History   Social History  . Marital status: Married    Spouse name: N/A  . Number of children: N/A  . Years of education: N/A   Occupational History  . Not on file.   Social History Main Topics  . Smoking status: Never Smoker  . Smokeless tobacco: Never Used  . Alcohol use No  . Drug use: No  . Sexual activity: No   Other Topics Concern  . Not on file   Social History Narrative  . No narrative on file    Outpatient Encounter Prescriptions as of 01/19/2017  Medication Sig Note  . acetaminophen (TYLENOL) 500 MG tablet Take 500 mg by mouth every 6 (six) hours as needed.   Marland Kitchen. aspirin EC 81 MG tablet Take by mouth. [facility]  .  Calcium-Magnesium-Vitamin D (CALCIUM 500 PO) Take by mouth. 12/14/2014: Received from: [facility]  . Coenzyme Q10 (CO Q10) 200 MG CAPS Take by mouth. 12/14/2014: Received from: [facility]  . DULoxetine (CYMBALTA) 20 MG capsule Take 1 capsule (20 mg total) by mouth daily. (Patient taking differently: Take 20 mg by mouth 2 (two) times daily. )   . esomeprazole (NEXIUM) 40 MG capsule TAKE 1 CAPSULE BY MOUTH EVERY DAY   . estrogens, conjugated, (PREMARIN) 0.3 MG tablet TAKE 1 TABLET BY MOUTH EVERY DAY FOR 21 DAYS THEN DO NOT TAKE FOR 7 DAYS   .  fish oil-omega-3 fatty acids 1000 MG capsule Take 1 g by mouth 2 (two) times daily.     Marland Kitchen gabapentin (NEURONTIN) 600 MG tablet Take 1 tablet (600 mg total) by mouth 3 (three) times daily.   . hydroxypropyl methylcellulose (ISOPTO TEARS) 2.5 % ophthalmic solution Place 1 drop into both eyes 3 (three) times daily as needed. Dry eyes    . losartan (COZAAR) 25 MG tablet TAKE 1 TABLET BY MOUTH EVERY DAY   . MAGNESIUM-OXIDE 400 (241.3 Mg) MG tablet TAKE 1 TABLET BY MOUTH TWICE DAILY   . MULTIPLE VITAMIN PO Take by mouth. 12/14/2014: Received from: [facility]  . oxyCODONE (OXY IR/ROXICODONE) 5 MG immediate release tablet TK SS TO ONE T PO Q 6 H PRN P   . pregabalin (LYRICA) 25 MG capsule Take 1 capsule (25 mg total) by mouth daily.   . ranitidine (ZANTAC) 150 MG capsule Take 1 capsule (150 mg total) by mouth 2 (two) times daily.   Marland Kitchen rOPINIRole (REQUIP) 1 MG tablet Take 1 mg by mouth. 1/2 tablet four times daily. 12/14/2014: Received from: [facility]  . senna-docusate (SENOKOT-S) 8.6-50 MG tablet Take 1 tablet by mouth daily as needed.   . tamsulosin (FLOMAX) 0.4 MG CAPS capsule Take 1 capsule (0.4 mg total) by mouth daily.   . urea (CARMOL) 40 % CREA Apply topically.   . [DISCONTINUED] esomeprazole (NEXIUM) 40 MG capsule Take by mouth. 12/14/2014: Received from: [facility]   No facility-administered encounter medications on file as of 01/19/2017.     Allergies  Allergen Reactions  . Acetaminophen Itching and Other (See Comments)    Stomach upset  . Aspirin Itching and Other (See Comments)    Stomach pain  . Other Other (See Comments)    Novocaine, strange feeling  . Sulfa Antibiotics Other (See Comments)  . Tizanidine     Diarrhea, almost passed out, felt dizzy and confused  . Caffeine Palpitations  . Ginger Palpitations  . Ibuprofen Rash    Review of Systems  Constitutional: Positive for malaise/fatigue.  Eyes: Negative.   Respiratory:  Negative.        Snores, gasping for air at night  Cardiovascular: Negative.   Gastrointestinal: Positive for abdominal pain (chronic) and diarrhea. Negative for nausea.  Musculoskeletal: Positive for back pain, joint pain and myalgias.       Unsteady balance  Skin: Negative.   Neurological: Positive for dizziness (occasionally), tingling and weakness.       Numbness sensation  Endo/Heme/Allergies: Negative.   Psychiatric/Behavioral: Negative.     Objective:  BP 132/60   Pulse 96   Temp 99 F (37.2 C)   Resp 18   Wt 176 lb 12.8 oz (80.2 kg)   BMI 28.54 kg/m    Physical Exam  Constitutional: She is oriented to person, place, and time and well-developed, well-nourished, and  in no distress.  HENT:  Head: Normocephalic and atraumatic.  Right Ear: External ear normal.  Left Ear: External ear normal.  Nose: Nose normal.  Eyes: Conjunctivae are normal. No scleral icterus.  Neck: No thyromegaly present.  Cardiovascular: Normal rate, regular rhythm, normal heart sounds and intact distal pulses.  Exam reveals no gallop.   No murmur heard. Pulmonary/Chest: Effort normal and breath sounds normal. No respiratory distress. She has no wheezes.  Abdominal: Soft.  Musculoskeletal:  Increased thoracic kyphosis.  Lymphadenopathy:    She has no cervical adenopathy.  Neurological: She is alert and oriented to person, place, and time.  Using a wheelchair for transportation.   Skin: Skin is warm and dry.  Very fair skin.  Psychiatric: Mood, memory, affect and judgment normal.   Assessment and Plan :  1. Essential hypertension Stable. Check CBC and A1C today. 2. Chronic anemia  3. Other chronic pain  4. Degeneration of intervertebral disc of lumbosacral region 5. Snores Possible sleep apnea. Epworth score today is 13. Discussed this with patient. Patient wants to wait on referral or proceeding to find out if she has sleep apnea. Follow as needed. 6. Apnea spell Consider sleep  study. 7. Palpitations EKG stable.Offered referral back to cardiologist -Dr Gwen Pounds. Refer back at this time. HPI, Exam and A&P transcribed by Samara Deist, RMA under direction and in the presence of Julieanne Manson, MD. I have done the exam and reviewed the chart and it is accurate to the best of my knowledge. Dentist has been used and  any errors in dictation or transcription are unintentional. Julieanne Manson M.D. [facility]

## 2017-01-19 NOTE — Patient Instructions (Signed)
[name] , Thank you for taking time to come for your Medicare Wellness Visit. I appreciate your ongoing commitment to your health goals. Please review the following plan we discussed and let me know if I can assist you in the future.   Screening recommendations/referrals: Colonoscopy: completed  Mammogram: completefd Bone Density: completed Recommended yearly ophthalmology/optometry visit for glaucoma screening and checkup Recommended yearly dental visit for hygiene and checkup  Vaccinations: Influenza vaccine: due fall 2018 Pneumococcal vaccine: completed series Tdap vaccine: declined Shingles vaccine: declined    Advanced directives: Please bring a copy of your POA (Power of Attorney) and/or Living Will to your next appointment.   Conditions/risks identified: Recommend to increase amount of fruits and vegetables in daily diet to 1-2 servings a day.  Next appointment: None, need to schedule 1 year AWV.   Preventive Care 4765 Years and Older, Female Preventive care refers to lifestyle choices and visits with your health care provider that can promote health and wellness. What does preventive care include?  A yearly physical exam. This is also called an annual well check.  Dental exams once or twice a year.  Routine eye exams. Ask your health care provider how often you should have your eyes checked.  Personal lifestyle choices, including:  Daily care of your teeth and gums.  Regular physical activity.  Eating a healthy diet.  Avoiding tobacco and drug use.  Limiting alcohol use.  Practicing safe sex.  Taking low-dose aspirin every day.  Taking vitamin and mineral supplements as recommended by your health care provider. What happens during an annual well check? The services and screenings done by your health care provider during your annual well check will depend on your age, overall health, lifestyle risk factors, and family history of disease. Counseling    Your health care provider may ask you questions about your:  Alcohol use.  Tobacco use.  Drug use.  Emotional well-being.  Home and relationship well-being.  Sexual activity.  Eating habits.  History of falls.  Memory and ability to understand (cognition).  Work and work Astronomerenvironment.  Reproductive health. Screening  You may have the following tests or measurements:  Height, weight, and BMI.  Blood pressure.  Lipid and cholesterol levels. These may be checked every 5 years, or more frequently if you are over 81 years old.  Skin check.  Lung cancer screening. You may have this screening every year starting at age 81 if you have a 30-pack-year history of smoking and currently smoke or have quit within the past 15 years.  Fecal occult blood test (FOBT) of the stool. You may have this test every year starting at age 81.  Flexible sigmoidoscopy or colonoscopy. You may have a sigmoidoscopy every 5 years or a colonoscopy every 10 years starting at age 81.  Hepatitis C blood test.  Hepatitis B blood test.  Sexually transmitted disease (STD) testing.  Diabetes screening. This is done by checking your blood sugar (glucose) after you have not eaten for a while (fasting). You may have this done every 1-3 years.  Bone density scan. This is done to screen for osteoporosis. You may have this done starting at age 81.  Mammogram. This may be done every 1-2 years. Talk to your health care provider about how often you should have regular mammograms. Talk with your health care provider about your test results, treatment options, and if necessary, the need for more tests. Vaccines  Your health care provider may recommend certain vaccines, such as:  Influenza vaccine. This is recommended every year.  Tetanus, diphtheria, and acellular pertussis (Tdap, Td) vaccine. You may need a Td booster every 10 years.  Zoster vaccine. You may need this after age 55.  Pneumococcal 13-valent  conjugate (PCV13) vaccine. One dose is recommended after age 73.  Pneumococcal polysaccharide (PPSV23) vaccine. One dose is recommended after age 11. Talk to your health care provider about which screenings and vaccines you need and how often you need them. This information is not intended to replace advice given to you by your health care provider. Make sure you discuss any questions you have with your health care provider. Document Released: 06/20/2015 Document Revised: 02/11/2016 Document Reviewed: 03/25/2015 Elsevier Interactive Patient Education  2017 [place] Prevention in the Home Falls can cause injuries. They can happen to people of all ages. There are many things you can do to make your home safe and to help prevent falls. What can I do on the outside of my home?  Regularly fix the edges of walkways and driveways and fix any cracks.  Remove anything that might make you trip as you walk through a door, such as a raised step or threshold.  Trim any bushes or trees on the path to your home.  Use bright outdoor lighting.  Clear any walking paths of anything that might make someone trip, such as rocks or tools.  Regularly check to see if handrails are loose or broken. Make sure that both sides of any steps have handrails.  Any raised decks and porches should have guardrails on the edges.  Have any leaves, snow, or ice cleared regularly.  Use sand or salt on walking paths during winter.  Clean up any spills in your garage right away. This includes oil or grease spills. What can I do in the bathroom?  Use night lights.  Install grab bars by the toilet and in the tub and shower. Do not use towel bars as grab bars.  Use non-skid mats or decals in the tub or shower.  If you need to sit down in the shower, use a plastic, non-slip stool.  Keep the floor dry. Clean up any water that spills on the floor as soon as it happens.  Remove soap buildup in the tub or  shower regularly.  Attach bath mats securely with double-sided non-slip rug tape.  Do not have throw rugs and other things on the floor that can make you trip. What can I do in the bedroom?  Use night lights.  Make sure that you have a light by your bed that is easy to reach.  Do not use any sheets or blankets that are too big for your bed. They should not hang down onto the floor.  Have a firm chair that has side arms. You can use this for support while you get dressed.  Do not have throw rugs and other things on the floor that can make you trip. What can I do in the kitchen?  Clean up any spills right away.  Avoid walking on wet floors.  Keep items that you use a lot in easy-to-reach places.  If you need to reach something above you, use a strong step stool that has a grab bar.  Keep electrical cords out of the way.  Do not use floor polish or wax that makes floors slippery. If you must use wax, use non-skid floor wax.  Do not have throw rugs and other things on the floor that  can make you trip. What can I do with my stairs?  Do not leave any items on the stairs.  Make sure that there are handrails on both sides of the stairs and use them. Fix handrails that are broken or loose. Make sure that handrails are as long as the stairways.  Check any carpeting to make sure that it is firmly attached to the stairs. Fix any carpet that is loose or worn.  Avoid having throw rugs at the top or bottom of the stairs. If you do have throw rugs, attach them to the floor with carpet tape.  Make sure that you have a light switch at the top of the stairs and the bottom of the stairs. If you do not have them, ask someone to add them for you. What else can I do to help prevent falls?  Wear shoes that:  Do not have high heels.  Have rubber bottoms.  Are comfortable and fit you well.  Are closed at the toe. Do not wear sandals.  If you use a stepladder:  Make sure that it is fully  opened. Do not climb a closed stepladder.  Make sure that both sides of the stepladder are locked into place.  Ask someone to hold it for you, if possible.  Clearly mark and make sure that you can see:  Any grab bars or handrails.  First and last steps.  Where the edge of each step is.  Use tools that help you move around (mobility aids) if they are needed. These include:  Canes.  Walkers.  Scooters.  Crutches.  Turn on the lights when you go into a dark area. Replace any light bulbs as soon as they burn out.  Set up your furniture so you have a clear path. Avoid moving your furniture around.  If any of your floors are uneven, fix them.  If there are any pets around you, be aware of where they are.  Review your medicines with your doctor. Some medicines can make you feel dizzy. This can increase your chance of falling. Ask your doctor what other things that you can do to help prevent falls. This information is not intended to replace advice given to you by your health care provider. Make sure you discuss any questions you have with your health care provider. Document Released: 03/20/2009 Document Revised: 10/30/2015 Document Reviewed: 06/28/2014 Elsevier Interactive Patient Education  2017 [employer].

## 2017-01-19 NOTE — Progress Notes (Signed)
Subjective:   Renee Carpenter is a 83 y.o. female who presents for Medicare Annual (Subsequent) preventive examination.  Review of Systems:  N/A  Cardiac Risk Factors include: advanced age (>25men, >51 women);diabetes mellitus     Objective:     Vitals: BP (!) 152/70 (BP Location: Left Arm)   Pulse 96   Temp 99 F (37.2 C) (Oral)   Ht 5\' 6"  (1.676 m)   Wt 176 lb 12.8 oz (80.2 kg)   BMI 28.54 kg/m   Body mass index is 28.54 kg/m.   Tobacco History  Smoking Status  . Never Smoker  Smokeless Tobacco  . Never Used     Counseling given: Not Answered   Past Medical History:  Diagnosis Date  . Anemia    WAS ANEMIC  . Arthritis   . Blood transfusion    HAD 1 SEVERAL YRS AGO...3-4 YR  . Chronic kidney disease    SOME DECREASE IN KIDNEY FUNCTION  . Diabetes mellitus without complication (HCC)   . Hypertension   . Normal cardiac stress test    REQUESTING COPY FROM [facility]  . Normal echocardiogram    REQUESTING THAT RESULT -- DR Park Breed   . PONV (postoperative nausea and vomiting)   . Shortness of breath   . Spondylolysis 1998   Past Surgical History:  Procedure Laterality Date  . ABDOMINAL HYSTERECTOMY    . BACK SURGERY     2 CERVICAL SURGERIES ALSO  . CATARACTS     RIGHT EYE CATARACT  . CERVICAL DISCECTOMY  08/1995 and 2011   C4-,C5-6, C6-7 anterior cervical; Dr. Danielle Dess fusion and plating  . LAMINECTOMY     decompressive; Dr. Polo Riley in [place]  . LUMBAR LAMINECTOMY/DECOMPRESSION MICRODISCECTOMY  04/14/2011   Procedure: LUMBAR LAMINECTOMY/DECOMPRESSION MICRODISCECTOMY;  Surgeon: Cristi Loron;  Location: [facility];  Service: Neurosurgery;  Laterality: N/A;  Lumbar Three and Lumbar Four Laminectomy  . TONSILLECTOMY AND ADENOIDECTOMY  1939   Family History  Problem Relation Age of Onset  . Heart failure Father   . Heart disease Father   . Hypertension Mother   . Diabetes Mother   . Heart disease Mother   . Cancer Sister        breast  .  Cancer Maternal Grandfather   . Cancer Maternal Aunt        x3   History  Sexual Activity  . Sexual activity: No    Outpatient Encounter Prescriptions as of 01/19/2017  Medication Sig  . acetaminophen (TYLENOL) 500 MG tablet Take 500 mg by mouth every 6 (six) hours as needed.  Marland Kitchen aspirin EC 81 MG tablet Take by mouth.  . Calcium-Magnesium-Vitamin D (CALCIUM 500 PO) Take by mouth.  . Coenzyme Q10 (CO Q10) 200 MG CAPS Take by mouth.  . DULoxetine (CYMBALTA) 20 MG capsule Take 1 capsule (20 mg total) by mouth daily. (Patient taking differently: Take 20 mg by mouth 2 (two) times daily. )  . esomeprazole (NEXIUM) 40 MG capsule TAKE 1 CAPSULE BY MOUTH EVERY DAY  . estrogens, conjugated, (PREMARIN) 0.3 MG tablet TAKE 1 TABLET BY MOUTH EVERY DAY FOR 21 DAYS THEN DO NOT TAKE FOR 7 DAYS  . gabapentin (NEURONTIN) 600 MG tablet Take 1 tablet (600 mg total) by mouth 3 (three) times daily.  . hydroxypropyl methylcellulose (ISOPTO TEARS) 2.5 % ophthalmic solution Place 1 drop into both eyes 3 (three) times daily as needed. Dry eyes   . losartan (COZAAR) 25 MG tablet TAKE 1  TABLET BY MOUTH EVERY DAY  . MAGNESIUM-OXIDE 400 (241.3 Mg) MG tablet TAKE 1 TABLET BY MOUTH TWICE DAILY  . MULTIPLE VITAMIN PO Take by mouth.  . pregabalin (LYRICA) 25 MG capsule Take 1 capsule (25 mg total) by mouth daily.  . ranitidine (ZANTAC) 150 MG capsule Take 1 capsule (150 mg total) by mouth 2 (two) times daily.  Marland Kitchen. rOPINIRole (REQUIP) 1 MG tablet Take 1 mg by mouth. 1/2 tablet four times daily.  . tamsulosin (FLOMAX) 0.4 MG CAPS capsule Take 1 capsule (0.4 mg total) by mouth daily.  . urea (CARMOL) 40 % CREA Apply topically.  . [DISCONTINUED] esomeprazole (NEXIUM) 40 MG capsule Take by mouth.  . fish oil-omega-3 fatty acids 1000 MG capsule Take 1 g by mouth 2 (two) times daily.    Marland Kitchen. oxyCODONE (OXY IR/ROXICODONE) 5 MG immediate release tablet TK SS TO ONE T PO Q 6 H PRN P  . senna-docusate (SENOKOT-S) 8.6-50 MG tablet Take 1  tablet by mouth daily as needed.   No facility-administered encounter medications on file as of 01/19/2017.     Activities of Daily Living In your present state of health, do you have any difficulty performing the following activities: 01/19/2017 04/20/2016  Hearing? Malvin JohnsY Y  Vision? Y N  Difficulty concentrating or making decisions? Y N  Walking or climbing stairs? Y Y  Comment - unable to climb up stairs due to balence  Dressing or bathing? N N  Doing errands, shopping? Y Y  Comment - not Holiday representativedriving  Preparing Food and eating ? Y N  Using the Toilet? N N  In the past six months, have you accidently leaked urine? Malvin JohnsY Y  Comment wears protection wears pads  Do you have problems with loss of bowel control? Y N  Comment occasionally -  Managing your Medications? N N  Managing your Finances? N N  Housekeeping or managing your Housekeeping? Y Y  Comment - needs assistance  Some recent data might be hidden    Patient Care Team: Maple HudsonGilbert, Richard L Jr., MD as PCP - General (Unknown Physician Specialty) Galen ManilaPorfilio, William, MD as Referring Physician (Ophthalmology) Lonell FaceShah, Hemang K, MD as Consulting Physician (Neurology) Orland Jarredroxler, Blanchie ServeMatthew, DPM as Attending Physician (Podiatry) Donette LarryBranch, Charles, MD as Referring Physician (Neurosurgery) Deirdre EvenerKowalski, David C, MD as Consulting Physician (Dermatology) Dayna BarkerBehalal-Bock, Christele, MD as Referring Physician (Internal Medicine)    Assessment:     Exercise Activities and Dietary recommendations Current Exercise Habits: Home exercise routine, Type of exercise: walking, Time (Minutes): 10, Frequency (Times/Week): 2 (to 3 days), Weekly Exercise (Minutes/Week): 20, Intensity: Mild, Exercise limited by: orthopedic condition(s)  Goals    . Eat more fruits and vegetables          Recommend to increase amount of fruits and vegetables in daily diet to 1-2 servings a day.    . Increase water intake          Starting 04/20/16, I will increase my water intake to 4  glasses a day.      Fall Risk Fall Risk  01/19/2017 04/20/2016 08/27/2015 04/29/2015  Falls in the past year? No Yes No No  Number falls in past yr: - 1 - -  Injury with Fall? - No - -  Risk for fall due to : - Impaired balance/gait - -   Depression Screen PHQ 2/9 Scores 01/19/2017 04/20/2016 08/27/2015  PHQ - 2 Score 1 1 0     Cognitive Function     6CIT Screen 01/19/2017 04/20/2016  What Year? 0 points 0 points  What month? 0 points 0 points  What time? 0 points 0 points  Count back from 20 0 points 0 points  Months in reverse 0 points 0 points  Repeat phrase 0 points 0 points  Total Score 0 0    Immunization History  Administered Date(s) Administered  . Influenza, High Dose Seasonal PF 04/29/2015, 04/20/2016  . Pneumococcal Conjugate-13 04/29/2015   Screening Tests Health Maintenance  Topic Date Due  . INFLUENZA VACCINE  01/05/2017  . FOOT EXAM  01/05/2017  . TETANUS/TDAP  04/20/2026 (Originally 12/23/1944)  . HEMOGLOBIN A1C  02/24/2017  . OPHTHALMOLOGY EXAM  09/05/2017  . DEXA SCAN  Completed  . PNA vac Low Risk Adult  Completed      Plan:  I have personally reviewed and addressed the Medicare Annual Wellness questionnaire and have noted the following in the patient's chart:  A. Medical and social history B. Use of alcohol, tobacco or illicit drugs  C. Current medications and supplements D. Functional ability and status E.  Nutritional status F.  Physical activity G. Advance directives H. List of other physicians I.  Hospitalizations, surgeries, and ER visits in previous 12 months J.  Vitals K. Screenings such as hearing and vision if needed, cognitive and depression L. Referrals and appointments - none  In addition, I have reviewed and discussed with patient certain preventive protocols, quality metrics, and best practice recommendations. A written personalized care plan for preventive services as well as general preventive health recommendations were  provided to patient.  See attached scanned questionnaire for additional information.   Signed,  Hyacinth Meeker, LPN Nurse Health Advisor   MD Recommendations: None. Pt declined tetanus vaccine today.

## 2017-01-20 LAB — CBC WITH DIFFERENTIAL/PLATELET
BASOS ABS: 0.2 10*3/uL (ref 0.0–0.2)
Basos: 2 %
EOS (ABSOLUTE): 0.3 10*3/uL (ref 0.0–0.4)
Eos: 4 %
Hematocrit: 26.7 % — ABNORMAL LOW (ref 34.0–46.6)
Hemoglobin: 9 g/dL — ABNORMAL LOW (ref 11.1–15.9)
Immature Grans (Abs): 0 10*3/uL (ref 0.0–0.1)
Immature Granulocytes: 1 %
LYMPHS ABS: 3.7 10*3/uL — AB (ref 0.7–3.1)
Lymphs: 44 %
MCH: 36.6 pg — AB (ref 26.6–33.0)
MCHC: 33.7 g/dL (ref 31.5–35.7)
MCV: 109 fL — ABNORMAL HIGH (ref 79–97)
Monocytes Absolute: 0.9 10*3/uL (ref 0.1–0.9)
Monocytes: 11 %
NEUTROS ABS: 3 10*3/uL (ref 1.4–7.0)
Neutrophils: 38 %
PLATELETS: 289 10*3/uL (ref 150–379)
RBC: 2.46 x10E6/uL — CL (ref 3.77–5.28)
RDW: 17.4 % — ABNORMAL HIGH (ref 12.3–15.4)
WBC: 8 10*3/uL (ref 3.4–10.8)

## 2017-01-20 LAB — HEMOGLOBIN A1C
Est. average glucose Bld gHb Est-mCnc: 192 mg/dL
HEMOGLOBIN A1C: 8.3 % — AB (ref 4.8–5.6)

## 2017-01-24 NOTE — Progress Notes (Signed)
Advised  ED 

## 2017-02-02 ENCOUNTER — Other Ambulatory Visit: Payer: Self-pay | Admitting: Nephrology

## 2017-02-02 DIAGNOSIS — N183 Chronic kidney disease, stage 3 unspecified: Secondary | ICD-10-CM

## 2017-02-02 DIAGNOSIS — R319 Hematuria, unspecified: Secondary | ICD-10-CM

## 2017-02-04 ENCOUNTER — Ambulatory Visit
Admission: RE | Admit: 2017-02-04 | Discharge: 2017-02-04 | Disposition: A | Discharge status: Home / Self Care | Payer: Medicare Other | Source: Ambulatory Visit | Attending: Nephrology | Admitting: Nephrology

## 2017-02-04 DIAGNOSIS — N183 Chronic kidney disease, stage 3 unspecified: Secondary | ICD-10-CM

## 2017-02-04 DIAGNOSIS — R319 Hematuria, unspecified: Secondary | ICD-10-CM | POA: Insufficient documentation

## 2017-02-14 ENCOUNTER — Other Ambulatory Visit: Payer: Self-pay | Admitting: Family Medicine

## 2017-02-14 DIAGNOSIS — Z90710 Acquired absence of both cervix and uterus: Principal | ICD-10-CM

## 2017-02-14 DIAGNOSIS — E8940 Asymptomatic postprocedural ovarian failure: Secondary | ICD-10-CM

## 2017-03-16 ENCOUNTER — Other Ambulatory Visit: Payer: Self-pay | Admitting: Family Medicine

## 2017-03-17 NOTE — Telephone Encounter (Signed)
Pharmacy requesting refills. Thanks!  

## 2017-03-22 ENCOUNTER — Other Ambulatory Visit: Payer: Self-pay | Admitting: Family Medicine

## 2017-03-24 ENCOUNTER — Ambulatory Visit (INDEPENDENT_AMBULATORY_CARE_PROVIDER_SITE_OTHER): Payer: Medicare Other

## 2017-03-24 DIAGNOSIS — Z23 Encounter for immunization: Secondary | ICD-10-CM | POA: Diagnosis not present

## 2017-04-14 ENCOUNTER — Other Ambulatory Visit: Payer: Self-pay | Admitting: Family Medicine

## 2017-04-14 DIAGNOSIS — G629 Polyneuropathy, unspecified: Secondary | ICD-10-CM

## 2017-05-18 ENCOUNTER — Ambulatory Visit: Payer: Medicare Other | Admitting: Family Medicine

## 2017-05-23 ENCOUNTER — Encounter: Payer: Self-pay | Admitting: Family Medicine

## 2017-05-23 ENCOUNTER — Ambulatory Visit: Payer: Medicare Other | Admitting: Family Medicine

## 2017-05-23 VITALS — BP 128/60 | HR 88 | Temp 97.6°F | Resp 16

## 2017-05-23 DIAGNOSIS — G8929 Other chronic pain: Secondary | ICD-10-CM

## 2017-05-23 DIAGNOSIS — Z780 Asymptomatic menopausal state: Secondary | ICD-10-CM | POA: Diagnosis not present

## 2017-05-23 DIAGNOSIS — F411 Generalized anxiety disorder: Secondary | ICD-10-CM

## 2017-05-23 DIAGNOSIS — G629 Polyneuropathy, unspecified: Secondary | ICD-10-CM

## 2017-05-23 DIAGNOSIS — E118 Type 2 diabetes mellitus with unspecified complications: Secondary | ICD-10-CM

## 2017-05-23 DIAGNOSIS — M549 Dorsalgia, unspecified: Secondary | ICD-10-CM

## 2017-05-23 DIAGNOSIS — M5137 Other intervertebral disc degeneration, lumbosacral region: Secondary | ICD-10-CM

## 2017-05-23 DIAGNOSIS — I10 Essential (primary) hypertension: Secondary | ICD-10-CM

## 2017-05-23 DIAGNOSIS — D649 Anemia, unspecified: Secondary | ICD-10-CM

## 2017-05-23 LAB — POCT GLYCOSYLATED HEMOGLOBIN (HGB A1C): Hemoglobin A1C: 9

## 2017-05-23 MED ORDER — METFORMIN HCL 500 MG PO TABS: 500.0000 mg | ORAL_TABLET | Freq: Every day | ORAL | 3 refills | Status: DC

## 2017-05-23 MED ORDER — ESTROGENS, CONJUGATED 0.625 MG/GM VA CREA: 1.0000 | TOPICAL_CREAM | Freq: Every day | VAGINAL | 12 refills | Status: DC

## 2017-05-23 NOTE — Progress Notes (Signed)
Patient: Renee Carpenter Female    DOB: Mar 22, 1926   83 y.o.   MRN: [id number] Visit Date: 05/23/2017  Today's Provider: Megan Mansichard Sharlee Rufino Jr, MD   Chief Complaint  Patient presents with  . Hypertension  . Anemia   Subjective:    HPI   Hypertension, follow-up:  BP Readings from Last 3 Encounters:  05/23/17 128/60  01/19/17 132/60  01/19/17 (!) 152/70    She was last seen for hypertension 6 months ago.  BP at that visit was 152/70. Management since that visit includes none. She reports good compliance with treatment. She is not having side effects.  She is not exercising. She is adherent to low salt diet.   Outside blood pressures are not being checked. Patient denies chest pain, chest pressure/discomfort, claudication, dyspnea, exertional chest pressure/discomfort, fatigue, irregular heart beat, lower extremity edema, near-syncope, orthopnea, palpitations, paroxysmal nocturnal dyspnea, syncope and tachypnea.    Wt Readings from Last 3 Encounters:  01/19/17 176 lb 12.8 oz (80.2 kg)  01/19/17 176 lb 12.8 oz (80.2 kg)  11/16/16 169 lb (76.7 kg)   ------------------------------------------------------------------------  Diabetes Mellitus Type II, Follow-up:   Lab Results  Component Value Date   HGBA1C 8.3 (H) 01/19/2017   HGBA1C 7.5 08/24/2016   HGBA1C 7.7 (H) 02/27/2016    Last seen for diabetes 6 months ago.  Management since then includes none. She reports good compliance with treatment. She is not having side effects.  Current symptoms include none  Home blood sugar records: not being checked  Episodes of hypoglycemia? no   Current Insulin Regimen: n/a Most Recent Eye Exam: 09/05/16 Weight trend: stable  Pertinent Labs:    Component Value Date/Time   CHOL 142 02/27/2016 1141   TRIG 120 02/27/2016 1141   HDL 64 02/27/2016 1141   LDLCALC 54 02/27/2016 1141   CREATININE 1.16 (H) 11/16/2016 1549   CREATININE 1.12 02/06/2014 1958    Wt  Readings from Last 3 Encounters:  01/19/17 176 lb 12.8 oz (80.2 kg)  01/19/17 176 lb 12.8 oz (80.2 kg)  11/16/16 169 lb (76.7 kg)    ------------------------------------------------------------------------ Pt has had a fall since she was here last. She has seen orthopedics for her shoulder pain. She reports that the fall about 4 weeks ago. She is better but still a little sore.       Allergies  Allergen Reactions  . Acetaminophen Itching and Other (See Comments)    Stomach upset  . Aspirin Itching and Other (See Comments)    Stomach pain  . Other Other (See Comments)    Novocaine, strange feeling  . Sulfa Antibiotics Other (See Comments)  . Tizanidine     Diarrhea, almost passed out, felt dizzy and confused  . Caffeine Palpitations  . Ginger Palpitations  . Ibuprofen Rash     Current Outpatient Medications:  .  acetaminophen (TYLENOL) 500 MG tablet, Take 500 mg by mouth every 6 (six) hours as needed., Disp: , Rfl:  .  aspirin EC 81 MG tablet, Take by mouth., Disp: , Rfl:  .  Calcium-Magnesium-Vitamin D (CALCIUM 500 PO), Take by mouth., Disp: , Rfl:  .  Coenzyme Q10 (CO Q10) 200 MG CAPS, Take by mouth., Disp: , Rfl:  .  DULoxetine (CYMBALTA) 20 MG capsule, Take 1 capsule (20 mg total) by mouth daily. (Patient taking differently: Take 20 mg by mouth 2 (two) times daily. ), Disp: 30 capsule, Rfl: 3 .  esomeprazole (NEXIUM) 40 MG capsule, TAKE  1 CAPSULE BY MOUTH EVERY DAY, Disp: 30 capsule, Rfl: 11 .  estrogens, conjugated, (PREMARIN) 0.3 MG tablet, TAKE 1 TABLET BY MOUTH EVERY DAY FOR 21 DAYS THEN DO NOT TAKE FOR 7 DAYS, Disp: 30 tablet, Rfl: 11 .  fish oil-omega-3 fatty acids 1000 MG capsule, Take 1 g by mouth 2 (two) times daily.  , Disp: , Rfl:  .  gabapentin (NEURONTIN) 600 MG tablet, Take 1 tablet (600 mg total) by mouth 3 (three) times daily., Disp: 270 tablet, Rfl: 3 .  hydroxypropyl methylcellulose (ISOPTO TEARS) 2.5 % ophthalmic solution, Place 1 drop into both eyes 3  (three) times daily as needed. Dry eyes , Disp: , Rfl:  .  losartan (COZAAR) 25 MG tablet, TAKE 1 TABLET BY MOUTH EVERY DAY, Disp: 90 tablet, Rfl: 3 .  LYRICA 25 MG capsule, TAKE 1 CAPSULE BY MOUTH DAILY, Disp: 30 capsule, Rfl: 5 .  MAGNESIUM-OXIDE 400 (241.3 Mg) MG tablet, TAKE 1 TABLET BY MOUTH TWICE DAILY, Disp: 60 tablet, Rfl: 11 .  MULTIPLE VITAMIN PO, Take by mouth., Disp: , Rfl:  .  oxyCODONE (OXY IR/ROXICODONE) 5 MG immediate release tablet, TK SS TO ONE T PO Q 6 H PRN P, Disp: , Rfl: 0 .  PREMARIN 0.3 MG tablet, TAKE 1 TABLET BY MOUTH EVERY DAY FOR 21 DAYS THEN DO NOT TAKE FOR 7 DAYS, Disp: 30 tablet, Rfl: 5 .  ranitidine (ZANTAC) 150 MG capsule, Take 1 capsule (150 mg total) by mouth 2 (two) times daily., Disp: 60 capsule, Rfl: 11 .  rOPINIRole (REQUIP) 1 MG tablet, Take 1 mg by mouth. 1/2 tablet four times daily., Disp: , Rfl:  .  senna-docusate (SENOKOT-S) 8.6-50 MG tablet, Take 1 tablet by mouth daily as needed., Disp: , Rfl:  .  tamsulosin (FLOMAX) 0.4 MG CAPS capsule, TAKE 1 CAPSULE(0.4 MG) BY MOUTH DAILY, Disp: 90 capsule, Rfl: 1 .  urea (CARMOL) 40 % CREA, Apply topically., Disp: , Rfl:   Review of Systems  Constitutional: Negative.   HENT: Negative.   Eyes: Negative.   Respiratory: Negative.   Cardiovascular: Negative.   Gastrointestinal: Negative.   Endocrine: Negative.   Genitourinary: Negative.   Musculoskeletal: Positive for arthralgias.  Skin: Negative.   Allergic/Immunologic: Negative.   Neurological: Negative.   Hematological: Negative.   Psychiatric/Behavioral: Negative.     Social History   Tobacco Use  . Smoking status: Never Smoker  . Smokeless tobacco: Never Used  Substance Use Topics  . Alcohol use: No   Objective:   BP 128/60 (BP Location: Left Arm, Patient Position: Sitting, Cuff Size: Large)   Pulse 88   Temp 97.6 F (36.4 C) (Oral)   Resp 16  Vitals:   05/23/17 1403  BP: 128/60  Pulse: 88  Resp: 16  Temp: 97.6 F (36.4 C)  TempSrc:  Oral     Physical Exam  Constitutional: She is oriented to person, place, and time. She appears well-developed and well-nourished.  HENT:  Head: Normocephalic and atraumatic.  Eyes: Conjunctivae and EOM are normal. Pupils are equal, round, and reactive to light. No scleral icterus.  Neck: Normal range of motion. Neck supple. No thyromegaly present.  Cardiovascular: Normal rate, regular rhythm, normal heart sounds and intact distal pulses.  Pulmonary/Chest: Effort normal and breath sounds normal.  Abdominal: Soft.  Musculoskeletal: Normal range of motion.  Severe thoracic kyphosis.  Neurological: She is alert and oriented to person, place, and time. She has normal reflexes. No cranial nerve deficit. She exhibits normal  muscle tone. Coordination normal.  Decreased sensation to monofilament of both feet.  Skin: Skin is warm and dry.  Psychiatric: She has a normal mood and affect. Her behavior is normal. Judgment and thought content normal.        Assessment & Plan:     1. Type 2 diabetes mellitus with complication, without long-term current use of insulin (HCC) Goal A1 c less than 8 to avoid hypoglycemia in 83yo. - POCT HgB A1C 9.0 today. Worse. Start Metformin. Follow up in 3 months.  - metFORMIN (GLUCOPHAGE) 500 MG tablet; Take 1 tablet (500 mg total) by mouth daily with breakfast.  Dispense: 90 tablet; Refill: 3  2. Degeneration of intervertebral disc of lumbosacral region   3. Essential hypertension   4. Neuropathy (HCC) Both feet/LE.  5. Post-menopausal Pt says she cannot do without estrogen. S/p hysterectomy. - conjugated estrogens (PREMARIN) vaginal cream; Place 1 Applicatorful vaginally daily.  Dispense: 42.5 g; Refill: 12      HPI, Exam, and A&P Transcribed under the direction and in the presence of Morna Flud L. Wendelyn BreslowGilbert Jr, MD  Electronically Signed: Silvio PateBrittany O'Dell, CMA  I have done the exam and reviewed the above chart and it is accurate to the best of my  knowledge. [employer]  technology has been used in this note in any air is in the dictation or transcription are unintentional.  Megan Mansichard Kemper Heupel Jr, MD  [facility]

## 2017-06-13 ENCOUNTER — Telehealth: Payer: Self-pay | Admitting: Family Medicine

## 2017-06-13 NOTE — Telephone Encounter (Signed)
Patient states that you put her on a new medication when she was in for OV in December (She thinks it was metFORMIN (GLUCOPHAGE) 500 MG tablet).  She states that it is making her nauseous and she stopped taking it.  She would like to know what you recommend.

## 2017-06-13 NOTE — Telephone Encounter (Signed)
Please advise, it was metformin 500 mg 1 daily-Renee Carpenter, RMA

## 2017-06-15 NOTE — Telephone Encounter (Signed)
Glimiperide 4 mf daily.Stop metformin.

## 2017-06-15 NOTE — Telephone Encounter (Signed)
lmtcb-Anastasiya V Hopkins, RMA  

## 2017-06-17 MED ORDER — GLIMEPIRIDE 4 MG PO TABS: 4.0000 mg | ORAL_TABLET | Freq: Every day | ORAL | 12 refills | Status: DC

## 2017-06-17 NOTE — Telephone Encounter (Signed)
Pt advised and RX sent in. Discussed possible allergy to Sulfa with Dr Sherrie MustacheFisher. Patient unsure of reaction and when. RX sent in and will see how patient does-Anastasiya V Hopkins, RMA

## 2017-06-24 DIAGNOSIS — I5022 Chronic systolic (congestive) heart failure: Secondary | ICD-10-CM | POA: Insufficient documentation

## 2017-07-01 ENCOUNTER — Other Ambulatory Visit: Payer: Self-pay | Admitting: Family Medicine

## 2017-07-01 ENCOUNTER — Ambulatory Visit
Admission: RE | Admit: 2017-07-01 | Discharge: 2017-07-01 | Disposition: A | Discharge status: Home / Self Care | Payer: Medicare Other | Source: Ambulatory Visit | Attending: Family Medicine | Admitting: Family Medicine

## 2017-07-01 DIAGNOSIS — Z1231 Encounter for screening mammogram for malignant neoplasm of breast: Secondary | ICD-10-CM | POA: Diagnosis present

## 2017-07-04 ENCOUNTER — Other Ambulatory Visit: Payer: Self-pay | Admitting: Family Medicine

## 2017-07-04 DIAGNOSIS — N6489 Other specified disorders of breast: Secondary | ICD-10-CM

## 2017-07-04 DIAGNOSIS — R928 Other abnormal and inconclusive findings on diagnostic imaging of breast: Secondary | ICD-10-CM

## 2017-07-11 ENCOUNTER — Telehealth: Payer: Self-pay

## 2017-07-11 NOTE — Telephone Encounter (Signed)
Left message for patient.  Need to discuss with her the Premarin.  Insurance states we need to switch it to something different. ED

## 2017-07-13 ENCOUNTER — Ambulatory Visit
Admission: RE | Admit: 2017-07-13 | Discharge: 2017-07-13 | Disposition: A | Discharge status: Home / Self Care | Payer: Medicare Other | Source: Ambulatory Visit | Attending: Family Medicine | Admitting: Family Medicine

## 2017-07-13 DIAGNOSIS — N6489 Other specified disorders of breast: Secondary | ICD-10-CM

## 2017-07-13 DIAGNOSIS — R928 Other abnormal and inconclusive findings on diagnostic imaging of breast: Secondary | ICD-10-CM | POA: Insufficient documentation

## 2017-07-14 NOTE — Telephone Encounter (Signed)
Advised  ED 

## 2017-07-19 ENCOUNTER — Telehealth: Payer: Self-pay | Admitting: Family Medicine

## 2017-07-19 NOTE — Telephone Encounter (Signed)
Pt stated that she is taking glimepiride (AMARYL) 4 MG tablet for diabetes. Pt is asking if she should be checking her blood sugar at home with a glucose meter or does Dr. Sullivan LoneGilbert just want to recheck it at her next OV? Pt stated that if she should be testing her blood sugar at home she will need a meter and supplies. Please advise. Thanks TNP

## 2017-07-20 NOTE — Telephone Encounter (Signed)
Please advise. Thanks.  

## 2017-08-03 ENCOUNTER — Telehealth: Payer: Self-pay | Admitting: Family Medicine

## 2017-08-03 DIAGNOSIS — E118 Type 2 diabetes mellitus with unspecified complications: Secondary | ICD-10-CM

## 2017-08-03 NOTE — Telephone Encounter (Signed)
Ok to send in glucometer for pt? If so, how many times a day do you want her to check her blood sugar. Please advise. Thanks.

## 2017-08-03 NOTE — Telephone Encounter (Signed)
Pt requesting a glucometer so she is able to test her blood sugar herself.  States she has been on the medication for a month or more and [facility] and the level has varied.

## 2017-08-03 NOTE — Telephone Encounter (Signed)
Just fasting.

## 2017-08-04 MED ORDER — GLUCOSE BLOOD VI STRP: ORAL_STRIP | 12 refills | Status: DC

## 2017-08-04 MED ORDER — ACCU-CHEK SOFT TOUCH LANCETS MISC: 12 refills | Status: DC

## 2017-08-04 MED ORDER — ACCU-CHEK AVIVA PLUS W/DEVICE KIT: 1.0000 | PACK | Freq: Once | 0 refills | Status: AC

## 2017-08-04 NOTE — Telephone Encounter (Signed)
Pt advised. Glucometer kit and supplies sent in.

## 2017-08-22 ENCOUNTER — Ambulatory Visit: Payer: Self-pay | Admitting: Family Medicine

## 2017-08-22 NOTE — Progress Notes (Deleted)
Patient: Renee Carpenter Female    DOB: 1926-01-06   84 y.o.   MRN: [id number] Visit Date: 08/22/2017  Today's Provider: Megan Mans, MD   No chief complaint on file.  Subjective:    HPI Patient is coming in for three months follow-up Diabetes Mellitus Type 2.  Diabetes Mellitus Type II, Follow-up:   Lab Results  Component Value Date   HGBA1C 9.0 05/23/2017   HGBA1C 8.3 (H) 01/19/2017   HGBA1C 7.5 08/24/2016    Last seen for diabetes 3 months ago.  Management since then includes Start Metformin daily. She reports excellent compliance with treatment. She is not having side effects.  Current symptoms include {Symptoms; diabetes:14075} and have been {Desc; course:15616}. Home blood sugar records: {diabetes glucometry results:16657}  Episodes of hypoglycemia? {yes***/no:17258}   Current Insulin Regimen: n/a Most Recent Eye Exam: *** Weight trend: stable.  Pertinent Labs:    Component Value Date/Time   CHOL 142 02/27/2016 1141   TRIG 120 02/27/2016 1141   HDL 64 02/27/2016 1141   LDLCALC 54 02/27/2016 1141   CREATININE 1.16 (H) 11/16/2016 1549   CREATININE 1.12 02/06/2014 1958    Wt Readings from Last 3 Encounters:  01/19/17 176 lb 12.8 oz (80.2 kg)  01/19/17 176 lb 12.8 oz (80.2 kg)  11/16/16 169 lb (76.7 kg)    ------------------------------------------------------------------------      Allergies  Allergen Reactions  . Acetaminophen Itching and Other (See Comments)    Stomach upset  . Aspirin Itching and Other (See Comments)    Stomach pain  . Metformin And Related     nausea  . Other Other (See Comments)    Novocaine, strange feeling  . Sulfa Antibiotics Other (See Comments)  . Tizanidine     Diarrhea, almost passed out, felt dizzy and confused  . Caffeine Palpitations  . Ginger Palpitations  . Ibuprofen Rash     Current Outpatient Medications:  .  acetaminophen (TYLENOL) 500 MG tablet, Take 500 mg by mouth every 6 (six)  hours as needed., Disp: , Rfl:  .  aspirin EC 81 MG tablet, Take by mouth., Disp: , Rfl:  .  Calcium-Magnesium-Vitamin D (CALCIUM 500 PO), Take by mouth., Disp: , Rfl:  .  Coenzyme Q10 (CO Q10) 200 MG CAPS, Take by mouth., Disp: , Rfl:  .  conjugated estrogens (PREMARIN) vaginal cream, Place 1 Applicatorful vaginally daily., Disp: 42.5 g, Rfl: 12 .  DULoxetine (CYMBALTA) 20 MG capsule, Take 1 capsule (20 mg total) by mouth daily. (Patient taking differently: Take 20 mg by mouth 2 (two) times daily. ), Disp: 30 capsule, Rfl: 3 .  esomeprazole (NEXIUM) 40 MG capsule, TAKE 1 CAPSULE BY MOUTH EVERY DAY, Disp: 30 capsule, Rfl: 11 .  estrogens, conjugated, (PREMARIN) 0.3 MG tablet, TAKE 1 TABLET BY MOUTH EVERY DAY FOR 21 DAYS THEN DO NOT TAKE FOR 7 DAYS, Disp: 30 tablet, Rfl: 11 .  fish oil-omega-3 fatty acids 1000 MG capsule, Take 1 g by mouth 2 (two) times daily.  , Disp: , Rfl:  .  gabapentin (NEURONTIN) 600 MG tablet, Take 1 tablet (600 mg total) by mouth 3 (three) times daily., Disp: 270 tablet, Rfl: 3 .  glimepiride (AMARYL) 4 MG tablet, Take 1 tablet (4 mg total) by mouth daily before breakfast., Disp: 30 tablet, Rfl: 12 .  glucose blood (ACCU-CHEK AVIVA PLUS) test strip, Check blood sugar once daily, fasting Use as instructed, Disp: 100 each, Rfl: 12 .  hydroxypropyl methylcellulose (  ISOPTO TEARS) 2.5 % ophthalmic solution, Place 1 drop into both eyes 3 (three) times daily as needed. Dry eyes , Disp: , Rfl:  .  Lancets (ACCU-CHEK SOFT TOUCH) lancets, Use as instructed, Disp: 100 each, Rfl: 12 .  losartan (COZAAR) 25 MG tablet, TAKE 1 TABLET BY MOUTH EVERY DAY, Disp: 90 tablet, Rfl: 3 .  LYRICA 25 MG capsule, TAKE 1 CAPSULE BY MOUTH DAILY, Disp: 30 capsule, Rfl: 5 .  MAGNESIUM-OXIDE 400 (241.3 Mg) MG tablet, TAKE 1 TABLET BY MOUTH TWICE DAILY, Disp: 60 tablet, Rfl: 11 .  MULTIPLE VITAMIN PO, Take by mouth., Disp: , Rfl:  .  oxyCODONE (OXY IR/ROXICODONE) 5 MG immediate release tablet, TK SS TO ONE  T PO Q 6 H PRN P, Disp: , Rfl: 0 .  ranitidine (ZANTAC) 150 MG capsule, Take 1 capsule (150 mg total) by mouth 2 (two) times daily., Disp: 60 capsule, Rfl: 11 .  rOPINIRole (REQUIP) 1 MG tablet, Take 1 mg by mouth. 1/2 tablet four times daily., Disp: , Rfl:  .  senna-docusate (SENOKOT-S) 8.6-50 MG tablet, Take 1 tablet by mouth daily as needed., Disp: , Rfl:  .  tamsulosin (FLOMAX) 0.4 MG CAPS capsule, TAKE 1 CAPSULE(0.4 MG) BY MOUTH DAILY, Disp: 90 capsule, Rfl: 1 .  urea (CARMOL) 40 % CREA, Apply topically., Disp: , Rfl:   Review of Systems  Social History   Tobacco Use  . Smoking status: Never Smoker  . Smokeless tobacco: Never Used  Substance Use Topics  . Alcohol use: No   Objective:   There were no vitals taken for this visit.   Physical Exam      Assessment & Plan:           Megan Mansichard Gilbert Jr, MD  [facility]

## 2017-08-23 NOTE — Telephone Encounter (Signed)
LMTCB ED 

## 2017-08-23 NOTE — Telephone Encounter (Signed)
Fasting BS checked at home. At least 2-3 times per wek.

## 2017-08-24 ENCOUNTER — Ambulatory Visit (INDEPENDENT_AMBULATORY_CARE_PROVIDER_SITE_OTHER): Payer: Medicare Other | Admitting: Family Medicine

## 2017-08-24 ENCOUNTER — Ambulatory Visit (INDEPENDENT_AMBULATORY_CARE_PROVIDER_SITE_OTHER): Payer: Medicare Other

## 2017-08-24 VITALS — BP 132/56 | HR 84 | Temp 98.9°F | Ht 66.0 in

## 2017-08-24 DIAGNOSIS — Z0000 Encounter for general adult medical examination without abnormal findings: Secondary | ICD-10-CM

## 2017-08-24 DIAGNOSIS — E785 Hyperlipidemia, unspecified: Secondary | ICD-10-CM

## 2017-08-24 DIAGNOSIS — G629 Polyneuropathy, unspecified: Secondary | ICD-10-CM

## 2017-08-24 DIAGNOSIS — Z0001 Encounter for general adult medical examination with abnormal findings: Secondary | ICD-10-CM

## 2017-08-24 DIAGNOSIS — E118 Type 2 diabetes mellitus with unspecified complications: Secondary | ICD-10-CM | POA: Diagnosis not present

## 2017-08-24 DIAGNOSIS — I10 Essential (primary) hypertension: Secondary | ICD-10-CM | POA: Diagnosis not present

## 2017-08-24 DIAGNOSIS — M79673 Pain in unspecified foot: Secondary | ICD-10-CM

## 2017-08-24 NOTE — Patient Instructions (Addendum)
[name] , Thank you for taking time to come for your Medicare Wellness Visit. I appreciate your ongoing commitment to your health goals. Please review the following plan we discussed and let me know if I can assist you in the future.   Screening recommendations/referrals: Colonoscopy: Up to date Mammogram: Up to date Bone Density: Up to date Recommended yearly ophthalmology/optometry visit for glaucoma screening and checkup Recommended yearly dental visit for hygiene and checkup  Vaccinations: Influenza vaccine: Up to date Pneumococcal vaccine: Up to date Tdap vaccine: Pt declines today.  Shingles vaccine: Pt declines today.     Advanced directives: Please bring a copy of your POA (Power of Attorney) and/or Living Will to your next appointment.   Conditions/risks identified: Fall risk prevention; Diet- continue current diet plan of eating a balanced diet with lots of fruits and vegetables and not eating sugars and salt.   Next appointment: 2:00 PM toady with Dr Sullivan LoneGilbert.   Preventive Care 2165 Years and Older, Female Preventive care refers to lifestyle choices and visits with your health care provider that can promote health and wellness. What does preventive care include?  A yearly physical exam. This is also called an annual well check.  Dental exams once or twice a year.  Routine eye exams. Ask your health care provider how often you should have your eyes checked.  Personal lifestyle choices, including:  Daily care of your teeth and gums.  Regular physical activity.  Eating a healthy diet.  Avoiding tobacco and drug use.  Limiting alcohol use.  Practicing safe sex.  Taking low-dose aspirin every day.  Taking vitamin and mineral supplements as recommended by your health care provider. What happens during an annual well check? The services and screenings done by your health care provider during your annual well check will depend on your age, overall health,  lifestyle risk factors, and family history of disease. Counseling  Your health care provider may ask you questions about your:  Alcohol use.  Tobacco use.  Drug use.  Emotional well-being.  Home and relationship well-being.  Sexual activity.  Eating habits.  History of falls.  Memory and ability to understand (cognition).  Work and work Astronomerenvironment.  Reproductive health. Screening  You may have the following tests or measurements:  Height, weight, and BMI.  Blood pressure.  Lipid and cholesterol levels. These may be checked every 5 years, or more frequently if you are over 82 years old.  Skin check.  Lung cancer screening. You may have this screening every year starting at age 555 if you have a 30-pack-year history of smoking and currently smoke or have quit within the past 15 years.  Fecal occult blood test (FOBT) of the stool. You may have this test every year starting at age 82.  Flexible sigmoidoscopy or colonoscopy. You may have a sigmoidoscopy every 5 years or a colonoscopy every 10 years starting at age 82.  Hepatitis C blood test.  Hepatitis B blood test.  Sexually transmitted disease (STD) testing.  Diabetes screening. This is done by checking your blood sugar (glucose) after you have not eaten for a while (fasting). You may have this done every 1-3 years.  Bone density scan. This is done to screen for osteoporosis. You may have this done starting at age 82.  Mammogram. This may be done every 1-2 years. Talk to your health care provider about how often you should have regular mammograms. Talk with your health care provider about your test results, treatment options,  and if necessary, the need for more tests. Vaccines  Your health care provider may recommend certain vaccines, such as:  Influenza vaccine. This is recommended every year.  Tetanus, diphtheria, and acellular pertussis (Tdap, Td) vaccine. You may need a Td booster every 10 years.  Zoster  vaccine. You may need this after age 46.  Pneumococcal 13-valent conjugate (PCV13) vaccine. One dose is recommended after age 79.  Pneumococcal polysaccharide (PPSV23) vaccine. One dose is recommended after age 4. Talk to your health care provider about which screenings and vaccines you need and how often you need them. This information is not intended to replace advice given to you by your health care provider. Make sure you discuss any questions you have with your health care provider. Document Released: 06/20/2015 Document Revised: 02/11/2016 Document Reviewed: 03/25/2015 Elsevier Interactive Patient Education  2017 [place] Prevention in the Home Falls can cause injuries. They can happen to people of all ages. There are many things you can do to make your home safe and to help prevent falls. What can I do on the outside of my home?  Regularly fix the edges of walkways and driveways and fix any cracks.  Remove anything that might make you trip as you walk through a door, such as a raised step or threshold.  Trim any bushes or trees on the path to your home.  Use bright outdoor lighting.  Clear any walking paths of anything that might make someone trip, such as rocks or tools.  Regularly check to see if handrails are loose or broken. Make sure that both sides of any steps have handrails.  Any raised decks and porches should have guardrails on the edges.  Have any leaves, snow, or ice cleared regularly.  Use sand or salt on walking paths during winter.  Clean up any spills in your garage right away. This includes oil or grease spills. What can I do in the bathroom?  Use night lights.  Install grab bars by the toilet and in the tub and shower. Do not use towel bars as grab bars.  Use non-skid mats or decals in the tub or shower.  If you need to sit down in the shower, use a plastic, non-slip stool.  Keep the floor dry. Clean up any water that spills on the  floor as soon as it happens.  Remove soap buildup in the tub or shower regularly.  Attach bath mats securely with double-sided non-slip rug tape.  Do not have throw rugs and other things on the floor that can make you trip. What can I do in the bedroom?  Use night lights.  Make sure that you have a light by your bed that is easy to reach.  Do not use any sheets or blankets that are too big for your bed. They should not hang down onto the floor.  Have a firm chair that has side arms. You can use this for support while you get dressed.  Do not have throw rugs and other things on the floor that can make you trip. What can I do in the kitchen?  Clean up any spills right away.  Avoid walking on wet floors.  Keep items that you use a lot in easy-to-reach places.  If you need to reach something above you, use a strong step stool that has a grab bar.  Keep electrical cords out of the way.  Do not use floor polish or wax that makes floors slippery. If  you must use wax, use non-skid floor wax.  Do not have throw rugs and other things on the floor that can make you trip. What can I do with my stairs?  Do not leave any items on the stairs.  Make sure that there are handrails on both sides of the stairs and use them. Fix handrails that are broken or loose. Make sure that handrails are as long as the stairways.  Check any carpeting to make sure that it is firmly attached to the stairs. Fix any carpet that is loose or worn.  Avoid having throw rugs at the top or bottom of the stairs. If you do have throw rugs, attach them to the floor with carpet tape.  Make sure that you have a light switch at the top of the stairs and the bottom of the stairs. If you do not have them, ask someone to add them for you. What else can I do to help prevent falls?  Wear shoes that:  Do not have high heels.  Have rubber bottoms.  Are comfortable and fit you well.  Are closed at the toe. Do not wear  sandals.  If you use a stepladder:  Make sure that it is fully opened. Do not climb a closed stepladder.  Make sure that both sides of the stepladder are locked into place.  Ask someone to hold it for you, if possible.  Clearly mark and make sure that you can see:  Any grab bars or handrails.  First and last steps.  Where the edge of each step is.  Use tools that help you move around (mobility aids) if they are needed. These include:  Canes.  Walkers.  Scooters.  Crutches.  Turn on the lights when you go into a dark area. Replace any light bulbs as soon as they burn out.  Set up your furniture so you have a clear path. Avoid moving your furniture around.  If any of your floors are uneven, fix them.  If there are any pets around you, be aware of where they are.  Review your medicines with your doctor. Some medicines can make you feel dizzy. This can increase your chance of falling. Ask your doctor what other things that you can do to help prevent falls. This information is not intended to replace advice given to you by your health care provider. Make sure you discuss any questions you have with your health care provider. Document Released: 03/20/2009 Document Revised: 10/30/2015 Document Reviewed: 06/28/2014 Elsevier Interactive Patient Education  2017 [employer].

## 2017-08-24 NOTE — Progress Notes (Signed)
Patient: Renee Carpenter, Female    DOB: 12-08-1925, 84 y.o.   MRN: [id number] Visit Date: 08/24/2017  Today's Provider: Megan Mansichard Rex Oesterle Jr, MD   Chief Complaint  Patient presents with  . Annual Exam   Subjective:    Annual physical exam Renee Carpenter is a 84 y.o. female who presents today for health maintenance and complete physical. She feels fairly well. She reports she is not exercising much. She reports she is sleeping fairly well.  -----------------------------------------------------------------  Colonoscopy- 11/01/2007 Dr. Servando SnareWohl Hemorrhoids and diverticulosis  Mammogram- 07/01/17 additional views, right breast u/s repeat 3 months. Fluid collection vs less likely hypoechoic mass. BMD-12/29/10 normal   Review of Systems  Constitutional: Positive for fatigue.  HENT: Positive for dental problem, hearing loss, sneezing and trouble swallowing.   Eyes: Positive for photophobia and visual disturbance.  Respiratory: Positive for apnea, cough and shortness of breath (nothing new for pt).   Cardiovascular: Positive for chest pain (pt describes as a pressure. says she took antacid and it went away) and palpitations (nothing for pt).  Gastrointestinal: Positive for abdominal distention, abdominal pain and diarrhea.  Endocrine: Positive for cold intolerance.  Genitourinary: Positive for decreased urine volume, frequency and urgency.  Musculoskeletal: Positive for arthralgias, back pain, myalgias and neck pain.  Skin: Negative.   Allergic/Immunologic: Negative.   Neurological: Positive for tremors, weakness, light-headedness and numbness.  Hematological: Bruises/bleeds easily.  Psychiatric/Behavioral: The patient is nervous/anxious.     Social History      She  reports that  has never smoked. she has never used smokeless tobacco. She reports that she does not drink alcohol or use drugs.       Social History   Socioeconomic History  . Marital status: Widowed   Spouse name: Not on file  . Number of children: 1  . Years of education: Not on file  . Highest education level: Bachelor's degree (e.g., BA, AB, BS)  Social Needs  . Financial resource strain: Not hard at all  . Food insecurity - worry: Never true  . Food insecurity - inability: Never true  . Transportation needs - medical: No  . Transportation needs - non-medical: No  Occupational History  . Occupation: retired  Tobacco Use  . Smoking status: Never Smoker  . Smokeless tobacco: Never Used  Substance and Sexual Activity  . Alcohol use: No  . Drug use: No  . Sexual activity: No  Other Topics Concern  . Not on file  Social History Narrative  . Not on file    Past Medical History:  Diagnosis Date  . Anemia    WAS ANEMIC  . Arthritis   . Blood transfusion    HAD 1 SEVERAL YRS AGO...3-4 YR  . Chronic kidney disease    SOME DECREASE IN KIDNEY FUNCTION  . Diabetes mellitus without complication (HCC)   . Hypertension   . Normal cardiac stress test    REQUESTING COPY FROM [facility]  . Normal echocardiogram    REQUESTING THAT RESULT -- DR Park BreedKAHN   . PONV (postoperative nausea and vomiting)   . Shortness of breath   . Spondylolysis 1998     Patient Active Problem List   Diagnosis Date Noted  . Chronic kidney disease, stage 3, mod decreased GFR (HCC) 04/15/2016  . Ds DNA antibody positive 04/15/2016  . Radicular pain of both lower extremities 04/13/2016  . Spine pain, lumbar 04/13/2016  . ANA positive 04/06/2016  . Right upper quadrant  pain 09/11/2015  . Neuropathic pain 04/15/2015  . Cervical pain 01/24/2015  . Depression 01/01/2015  . Back pain, chronic 12/14/2014  . COPD, mild (HCC) 12/14/2014  . Anxiety, generalized 12/14/2014  . Esophageal reflux 12/14/2014  . Diabetes (HCC) 12/14/2014  . Progeria syndrome 12/14/2014  . BP (high blood pressure) 12/14/2014  . Adaptive colitis 12/14/2014  . Cannot sleep 12/14/2014  . Affective disorder, major 12/14/2014    . Chronic anemia 12/14/2014  . Arthritis, degenerative 12/14/2014  . OP (osteoporosis) 12/14/2014  . Restless leg 12/14/2014  . Neuralgia neuritis, sciatic nerve 12/14/2014  . 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 Absolute anemia 03/03/2012  . Lumbar canal stenosis 02/10/2012  . DDD (degenerative disc disease), lumbosacral 11/19/2011  . Neuropathy (HCC) 02/26/2008  . Essential hypertension 02/26/2008  . Fatigue 02/26/2008  . DYSPNEA 02/26/2008    Past Surgical History:  Procedure Laterality Date  . ABDOMINAL HYSTERECTOMY    . BACK SURGERY     2 CERVICAL SURGERIES ALSO  . CATARACTS     RIGHT EYE CATARACT  . CERVICAL DISCECTOMY  08/1995 and 2011   C4-,C5-6, C6-7 anterior cervical; Dr. Danielle Dess fusion and plating  . LAMINECTOMY     decompressive; Dr. Polo Riley in [place]  . LUMBAR LAMINECTOMY/DECOMPRESSION MICRODISCECTOMY  04/14/2011   Procedure: LUMBAR LAMINECTOMY/DECOMPRESSION MICRODISCECTOMY;  Surgeon: Cristi Loron;  Location: [facility];  Service: Neurosurgery;  Laterality: N/A;  Lumbar Three and Lumbar Four Laminectomy  . TONSILLECTOMY AND ADENOIDECTOMY  1939    Family History        Family Status  Relation Name Status  . Father  Deceased at age 56       cause of death MI and cardiovascular disease  . Mother  Deceased at age 64       cause of death was MI and malignant hypertension uncontrollable.  . Sister  Alive  . MGF  (Not Specified)  . Mat Aunt  (Not Specified)  . Daughter  (Not Specified)        Her family history includes Breast cancer (age of onset: 70) in her sister; Breast cancer (age of onset: 66) in her daughter; Cancer in her maternal aunt, maternal grandfather, and sister; Diabetes in her mother; Heart disease in her father and mother; Heart failure in her father; Hypertension in her mother.      Allergies  Allergen Reactions  . Acetaminophen Itching and Other (See Comments)    Stomach upset  . Aspirin Itching and Other  (See Comments)    Stomach pain  . Metformin And Related     nausea  . Other Other (See Comments)    Novocaine, strange feeling  . Sulfa Antibiotics Other (See Comments)  . Tizanidine     Diarrhea, almost passed out, felt dizzy and confused  . Caffeine Palpitations  . Ginger Palpitations  . Ibuprofen Rash     Current Outpatient Medications:  .  acetaminophen (TYLENOL) 500 MG tablet, Take 500 mg by mouth every 6 (six) hours as needed., Disp: , Rfl:  .  aspirin EC 81 MG tablet, Take 81 mg by mouth daily. , Disp: , Rfl:  .  Calcium-Magnesium-Vitamin D (CALCIUM 500 PO), Take by mouth daily. , Disp: , Rfl:  .  calcium-vitamin D (OSCAL WITH D) 500-200 MG-UNIT tablet, Take 1 tablet by mouth 2 (two) times daily., Disp: , Rfl:  .  Coenzyme Q10 (CO Q10) 200 MG CAPS, Take by mouth daily. , Disp: , Rfl:  .  conjugated estrogens (PREMARIN) vaginal cream, Place 1 Applicatorful vaginally daily. (Patient not taking: Reported on 08/24/2017), Disp: 42.5 g, Rfl: 12 .  DULoxetine (CYMBALTA) 20 MG capsule, Take 1 capsule (20 mg total) by mouth daily. (Patient taking differently: Take 20 mg by mouth 2 (two) times daily. ), Disp: 30 capsule, Rfl: 3 .  esomeprazole (NEXIUM) 40 MG capsule, TAKE 1 CAPSULE BY MOUTH EVERY DAY, Disp: 30 capsule, Rfl: 11 .  estrogens, conjugated, (PREMARIN) 0.3 MG tablet, TAKE 1 TABLET BY MOUTH EVERY DAY FOR 21 DAYS THEN DO NOT TAKE FOR 7 DAYS (Patient not taking: Reported on 08/24/2017), Disp: 30 tablet, Rfl: 11 .  fish oil-omega-3 fatty acids 1000 MG capsule, Take 1 g by mouth 2 (two) times daily.  , Disp: , Rfl:  .  gabapentin (NEURONTIN) 600 MG tablet, Take 1 tablet (600 mg total) by mouth 3 (three) times daily., Disp: 270 tablet, Rfl: 3 .  glimepiride (AMARYL) 4 MG tablet, Take 1 tablet (4 mg total) by mouth daily before breakfast., Disp: 30 tablet, Rfl: 12 .  glucose blood (ACCU-CHEK AVIVA PLUS) test strip, Check blood sugar once daily, fasting Use as instructed, Disp: 100 each,  Rfl: 12 .  hydroxypropyl methylcellulose (ISOPTO TEARS) 2.5 % ophthalmic solution, Place 1 drop into both eyes 3 (three) times daily as needed. Dry eyes , Disp: , Rfl:  .  Lancets (ACCU-CHEK SOFT TOUCH) lancets, Use as instructed, Disp: 100 each, Rfl: 12 .  losartan (COZAAR) 25 MG tablet, TAKE 1 TABLET BY MOUTH EVERY DAY, Disp: 90 tablet, Rfl: 3 .  LYRICA 25 MG capsule, TAKE 1 CAPSULE BY MOUTH DAILY, Disp: 30 capsule, Rfl: 5 .  MAGNESIUM-OXIDE 400 (241.3 Mg) MG tablet, TAKE 1 TABLET BY MOUTH TWICE DAILY, Disp: 60 tablet, Rfl: 11 .  MULTIPLE VITAMIN PO, Take by mouth., Disp: , Rfl:  .  oxyCODONE (OXY IR/ROXICODONE) 5 MG immediate release tablet, TK SS TO ONE T PO Q 6 H PRN P, Disp: , Rfl: 0 .  ranitidine (ZANTAC) 150 MG capsule, Take 1 capsule (150 mg total) by mouth 2 (two) times daily., Disp: 60 capsule, Rfl: 11 .  rOPINIRole (REQUIP) 1 MG tablet, Take 1 mg by mouth. 1/2 tablet four times daily., Disp: , Rfl:  .  senna-docusate (SENOKOT-S) 8.6-50 MG tablet, Take 1 tablet by mouth daily as needed., Disp: , Rfl:  .  tamsulosin (FLOMAX) 0.4 MG CAPS capsule, TAKE 1 CAPSULE(0.4 MG) BY MOUTH DAILY, Disp: 90 capsule, Rfl: 1 .  urea (CARMOL) 40 % CREA, Apply topically., Disp: , Rfl:    Patient Care Team: Maple Hudson., MD as PCP - General (Unknown Physician Specialty) Lonell Face, MD as Consulting Physician (Neurology) Orland Jarred, Molli Hazard, DPM as Attending Physician (Podiatry) Donette Larry, MD as Referring Physician (Neurosurgery) Deirdre Evener, MD as Consulting Physician (Dermatology) Dayna Barker, MD as Referring Physician (Internal Medicine) Lockie Mola, MD as Referring Physician (Ophthalmology)      Objective:   Vitals: BP (!) 132/56   Pulse 84   Temp 98.9 F (37.2 C) (Oral)   Ht 5\' 6"  (1.676 m)   BMI 28.54 kg/m    Vitals:   08/24/17 1402  BP: (!) 132/56  Pulse: 84  Temp: 98.9 F (37.2 C)  TempSrc: Oral  Height: 5\' 6"  (1.676 m)     Physical  Exam  Constitutional: She is oriented to person, place, and time. She appears well-developed and well-nourished.  HENT:  Head: Normocephalic and atraumatic.  Right Ear: External  ear normal.  Left Ear: External ear normal.  Nose: Nose normal.  Mouth/Throat: Oropharynx is clear and moist.  Eyes: Conjunctivae are normal. No scleral icterus.  Neck: No thyromegaly present.  Cardiovascular: Normal rate, regular rhythm, normal heart sounds and intact distal pulses.  Pulmonary/Chest: Effort normal and breath sounds normal.  Abdominal: Soft.  Musculoskeletal: She exhibits no edema.  Increased thoracic kyphosis.  Neurological: She is alert and oriented to person, place, and time.  Skin: Skin is warm and dry.  Fair skin.   Psychiatric: She has a normal mood and affect. Her behavior is normal. Judgment and thought content normal.     Depression Screen PHQ 2/9 Scores 08/24/2017 05/23/2017 01/19/2017 04/20/2016  PHQ - 2 Score 1 2 1 1   PHQ- 9 Score - 3 - -      Assessment & Plan:     Routine Health Maintenance and Physical Exam  Exercise Activities and Dietary recommendations Goals    . DIET - EAT MORE FRUITS AND VEGETABLES     Continue current diet plan of eating a balanced diet with lots of fruits and vegetables and not eating sugars and salt.        Immunization History  Administered Date(s) Administered  . Influenza, High Dose Seasonal PF 04/29/2015, 04/20/2016, 03/24/2017  . Pneumococcal Conjugate-13 04/29/2015    Health Maintenance  Topic Date Due  . TETANUS/TDAP  04/20/2026 (Originally 12/23/1944)  . OPHTHALMOLOGY EXAM  09/05/2017  . HEMOGLOBIN A1C  11/21/2017  . FOOT EXAM  05/23/2018  . INFLUENZA VACCINE  Completed  . DEXA SCAN  Completed  . PNA vac Low Risk Adult  Completed     Discussed health benefits of physical activity, and encouraged her to engage in regular exercise appropriate for her age and condition.  Neuropathy/chronic foot  pain TIIDM ABI DDD OA Osteoporosis HTN  HLD Chest pressure/Dysphagia Chronic.stop calcium and fish oil due to size of meds.   --------------------------------------------------------------------  I have done the exam and reviewed the above chart and it is accurate to the best of my knowledge. Dentist has been used in this note in any air is in the dictation or transcription are unintentional.   Megan Mans, MD  [facility]

## 2017-08-24 NOTE — Patient Instructions (Signed)
Stop calcium and fish oil

## 2017-08-24 NOTE — Telephone Encounter (Signed)
Pt advised at appt today

## 2017-08-24 NOTE — Progress Notes (Signed)
Subjective:   Renee Carpenter is a 84 y.o. female who presents for Medicare Annual (Subsequent) preventive examination.  Review of Systems:  N/A  Cardiac Risk Factors include: advanced age (>3555men, 29>65 women);diabetes mellitus     Objective:     Vitals: BP (!) 132/56 (BP Location: Right Arm)   Pulse 84   Temp 98.9 F (37.2 C) (Oral)   Ht 5\' 6"  (1.676 m)   BMI 28.54 kg/m   Body mass index is 28.54 kg/m.  Advanced Directives 08/24/2017 01/19/2017 04/20/2016 04/07/2016 04/14/2011 04/07/2011  Does Patient Have a Medical Advance Directive? Yes Yes Yes Yes - Patient has advance directive, copy not in chart  Type of Advance Directive Healthcare Power of [place];Living will Healthcare Power of [place];Living will Living will;Healthcare Power of [employer] Corporationttorney Healthcare Power of [place]orney;Living will Healthcare Power of [employer] Healthcare Power of Attorney  Copy of Healthcare Power of Attorney in Chart? No - copy requested No - copy requested No - copy requested - - -  Pre-existing out of facility DNR order (yellow form or pink MOST form) - - - - No -    Tobacco Social History   Tobacco Use  Smoking Status Never Smoker  Smokeless Tobacco Never Used     Counseling given: Not Answered   Clinical Intake:  Pre-visit preparation completed: Yes  Pain : No/denies pain Pain Score: 0-No pain     Nutritional Status: BMI 25 -29 Overweight Nutritional Risks: None Diabetes: Yes(type 2) CBG done?: No Did pt. bring in CBG monitor from home?: No  How often do you need to have someone help you when you read instructions, pamphlets, or other written materials from your doctor or pharmacy?: 1 - Never  Interpreter Needed?: No  Information entered by :: [facility] PennsylvaniaMmarkoski, LPN  Past Medical History:  Diagnosis Date  . Anemia    WAS ANEMIC  . Arthritis   . Blood transfusion    HAD 1 SEVERAL YRS AGO...3-4 YR  . Chronic kidney disease    SOME DECREASE IN KIDNEY FUNCTION  . Diabetes mellitus  without complication (HCC)   . Hypertension   . Normal cardiac stress test    REQUESTING COPY FROM [facility]  . Normal echocardiogram    REQUESTING THAT RESULT -- DR Park BreedKAHN   . PONV (postoperative nausea and vomiting)   . Shortness of breath   . Spondylolysis 1998   Past Surgical History:  Procedure Laterality Date  . ABDOMINAL HYSTERECTOMY    . BACK SURGERY     2 CERVICAL SURGERIES ALSO  . CATARACTS     RIGHT EYE CATARACT  . CERVICAL DISCECTOMY  08/1995 and 2011   C4-,C5-6, C6-7 anterior cervical; Dr. Danielle DessElsner fusion and plating  . LAMINECTOMY     decompressive; [place]o [place] in [place]  . LUMBAR LAMINECTOMY/DECOMPRESSION MICRODISCECTOMY  04/14/2011   Procedure: LUMBAR LAMINECTOMY/DECOMPRESSION MICRODISCECTOMY;  Surgeon: Cristi LoronJeffrey D Jenkins;  Location: [facility];  Service: Neurosurgery;  Laterality: N/A;  Lumbar Three and Lumbar Four Laminectomy  . TONSILLECTOMY AND ADENOIDECTOMY  1939   Family History  Problem Relation Age of Onset  . Heart failure Father   . Heart disease Father   . Hypertension Mother   . Diabetes Mother   . Heart disease Mother   . Cancer Sister        breast  . Breast cancer Sister 351  . Cancer Maternal Grandfather   . Cancer Maternal Aunt        x3  . Breast cancer Daughter  6   Social History   Socioeconomic History  . Marital status: Widowed    Spouse name: None  . Number of children: 1  . Years of education: None  . Highest education level: Bachelor's degree (e.g., BA, AB, BS)  Social Needs  . Financial resource strain: Not hard at all  . Food insecurity - worry: Never true  . Food insecurity - inability: Never true  . Transportation needs - medical: No  . Transportation needs - non-medical: No  Occupational History  . Occupation: retired  Tobacco Use  . Smoking status: Never Smoker  . Smokeless tobacco: Never Used  Substance and Sexual Activity  . Alcohol use: No  . Drug use: No  . Sexual activity: No  Other Topics Concern  .  None  Social History Narrative  . None    Outpatient Encounter Medications as of 08/24/2017  Medication Sig  . acetaminophen (TYLENOL) 500 MG tablet Take 500 mg by mouth every 6 (six) hours as needed.  Marland Kitchen aspirin EC 81 MG tablet Take 81 mg by mouth daily.   . calcium-vitamin D (OSCAL WITH D) 500-200 MG-UNIT tablet Take 1 tablet by mouth 2 (two) times daily.  . Coenzyme Q10 (CO Q10) 200 MG CAPS Take by mouth daily.   . DULoxetine (CYMBALTA) 20 MG capsule Take 1 capsule (20 mg total) by mouth daily. (Patient taking differently: Take 20 mg by mouth 2 (two) times daily. )  . esomeprazole (NEXIUM) 40 MG capsule TAKE 1 CAPSULE BY MOUTH EVERY DAY  . fish oil-omega-3 fatty acids 1000 MG capsule Take 1 g by mouth 2 (two) times daily.    Marland Kitchen gabapentin (NEURONTIN) 600 MG tablet Take 1 tablet (600 mg total) by mouth 3 (three) times daily.  Marland Kitchen glimepiride (AMARYL) 4 MG tablet Take 1 tablet (4 mg total) by mouth daily before breakfast.  . glucose blood (ACCU-CHEK AVIVA PLUS) test strip Check blood sugar once daily, fasting Use as instructed  . hydroxypropyl methylcellulose (ISOPTO TEARS) 2.5 % ophthalmic solution Place 1 drop into both eyes 3 (three) times daily as needed. Dry eyes   . Lancets (ACCU-CHEK SOFT TOUCH) lancets Use as instructed  . losartan (COZAAR) 25 MG tablet TAKE 1 TABLET BY MOUTH EVERY DAY  . LYRICA 25 MG capsule TAKE 1 CAPSULE BY MOUTH DAILY  . MAGNESIUM-OXIDE 400 (241.3 Mg) MG tablet TAKE 1 TABLET BY MOUTH TWICE DAILY  . MULTIPLE VITAMIN PO Take by mouth.  . ranitidine (ZANTAC) 150 MG capsule Take 1 capsule (150 mg total) by mouth 2 (two) times daily.  Marland Kitchen rOPINIRole (REQUIP) 1 MG tablet Take 1 mg by mouth. 1/2 tablet four times daily.  . tamsulosin (FLOMAX) 0.4 MG CAPS capsule TAKE 1 CAPSULE(0.4 MG) BY MOUTH DAILY  . Calcium-Magnesium-Vitamin D (CALCIUM 500 PO) Take by mouth daily.   Marland Kitchen conjugated estrogens (PREMARIN) vaginal cream Place 1 Applicatorful vaginally daily. (Patient not  taking: Reported on 08/24/2017)  . estrogens, conjugated, (PREMARIN) 0.3 MG tablet TAKE 1 TABLET BY MOUTH EVERY DAY FOR 21 DAYS THEN DO NOT TAKE FOR 7 DAYS (Patient not taking: Reported on 08/24/2017)  . oxyCODONE (OXY IR/ROXICODONE) 5 MG immediate release tablet TK SS TO ONE T PO Q 6 H PRN P  . senna-docusate (SENOKOT-S) 8.6-50 MG tablet Take 1 tablet by mouth daily as needed.  . urea (CARMOL) 40 % CREA Apply topically.   No facility-administered encounter medications on file as of 08/24/2017.     Activities of Daily Living In your  present state of health, do you have any difficulty performing the following activities: 08/24/2017 01/19/2017  Hearing? Y Y  Comment Has bilateral hearing aids but does not wear them. -  Vision? Y Y  Comment Vision changes- plans to f/u with eye doctor. -  Difficulty concentrating or making decisions? N Y  Walking or climbing stairs? Y Y  Dressing or bathing? N N  Doing errands, shopping? Y Y  Comment Does not drive.  -  Preparing Food and eating ? N Y  Using the Toilet? N N  In the past six months, have you accidently leaked urine? Y Y  Comment Ocassionally, does wear protection.  wears protection  Do you have problems with loss of bowel control? N Y  Comment - occasionally  Managing your Medications? N N  Managing your Finances? N N  Housekeeping or managing your Housekeeping? Malvin Johns  Some recent data might be hidden    Patient Care Team: Maple Hudson., MD as PCP - General (Unknown Physician Specialty) Lonell Face, MD as Consulting Physician (Neurology) Troxler, Blanchie Serve as Attending Physician (Podiatry) Donette Larry, MD as Referring Physician (Neurosurgery) Deirdre Evener, MD as Consulting Physician (Dermatology) Dayna Barker, MD as Referring Physician (Internal Medicine) Lockie Mola, MD as Referring Physician (Ophthalmology)    Assessment:   This is a routine wellness examination for [place].  Exercise  Activities and Dietary recommendations Current Exercise Habits: The patient does not participate in regular exercise at present, Exercise limited by: orthopedic condition(s)  Goals    . DIET - EAT MORE FRUITS AND VEGETABLES     Continue current diet plan of eating a balanced diet with lots of fruits and vegetables and not eating sugars and salt.        Fall Risk Fall Risk  08/24/2017 01/19/2017 04/20/2016 08/27/2015 04/29/2015  Falls in the past year? Yes No Yes No No  Number falls in past yr: 2 or more - 1 - -  Injury with Fall? No - No - -  Risk for fall due to : - - Impaired balance/gait - -  Follow up Falls prevention discussed - - - -   Is the patient's home free of loose throw rugs in walkways, pet beds, electrical cords, etc?   yes      Grab bars in the bathroom? yes      Handrails on the stairs?   no      Adequate lighting?   yes  Timed Get Up and Go performed: N/A  Depression Screen PHQ 2/9 Scores 08/24/2017 05/23/2017 01/19/2017 04/20/2016  PHQ - 2 Score 1 2 1 1   PHQ- 9 Score - 3 - -     Cognitive Function: Pt declined screening today.      6CIT Screen 01/19/2017 04/20/2016  What Year? 0 points 0 points  What month? 0 points 0 points  What time? 0 points 0 points  Count back from 20 0 points 0 points  Months in reverse 0 points 0 points  Repeat phrase 0 points 0 points  Total Score 0 0    Immunization History  Administered Date(s) Administered  . Influenza, High Dose Seasonal PF 04/29/2015, 04/20/2016, 03/24/2017  . Pneumococcal Conjugate-13 04/29/2015    Qualifies for Shingles Vaccine? Due for Shingles vaccine. Declined my offer to administer today. Education has been provided regarding the importance of this vaccine. Pt has been advised to call her insurance company to determine her out of pocket expense. Advised she may  also receive this vaccine at her local pharmacy or Health Dept. Verbalized acceptance and understanding.  Screening Tests Health  Maintenance  Topic Date Due  . TETANUS/TDAP  04/20/2026 (Originally 12/23/1944)  . OPHTHALMOLOGY EXAM  09/05/2017  . HEMOGLOBIN A1C  11/21/2017  . FOOT EXAM  05/23/2018  . INFLUENZA VACCINE  Completed  . DEXA SCAN  Completed  . PNA vac Low Risk Adult  Completed    Cancer Screenings: Lung: Low Dose CT Chest recommended if Age 50-80 years, 30 pack-year currently smoking OR have quit w/in 15years. Patient does not qualify. Breast:  Up to date on Mammogram? Yes   Up to date of Bone Density/Dexa? Yes Colorectal: Up to date  Additional Screenings:  Hepatitis C Screening: N/A     Plan:  I have personally reviewed and addressed the Medicare Annual Wellness questionnaire and have noted the following in the patient's chart:  A. Medical and social history B. Use of alcohol, tobacco or illicit drugs  C. Current medications and supplements D. Functional ability and status E.  Nutritional status F.  Physical activity G. Advance directives H. List of other physicians I.  Hospitalizations, surgeries, and ER visits in previous 12 months J.  Vitals K. Screenings such as hearing and vision if needed, cognitive and depression L. Referrals and appointments - none  In addition, I have reviewed and discussed with patient certain preventive protocols, quality metrics, and best practice recommendations. A written personalized care plan for preventive services as well as general preventive health recommendations were provided to patient.  See attached scanned questionnaire for additional information.   Signed,  Hyacinth Meeker, LPN Nurse Health Advisor   Nurse Recommendations: Pt declined the tetanus vaccine today.

## 2017-08-26 ENCOUNTER — Ambulatory Visit (INDEPENDENT_AMBULATORY_CARE_PROVIDER_SITE_OTHER): Payer: Medicare Other

## 2017-08-26 DIAGNOSIS — M79605 Pain in left leg: Secondary | ICD-10-CM

## 2017-08-26 LAB — POCT ANKLE BRACHIAL INDEX ASSESSMENT (BFP)

## 2017-08-27 LAB — TSH: TSH: 3.68 u[IU]/mL (ref 0.450–4.500)

## 2017-08-27 LAB — LIPID PANEL
CHOLESTEROL TOTAL: 149 mg/dL (ref 100–199)
Chol/HDL Ratio: 3.2 ratio (ref 0.0–4.4)
HDL: 47 mg/dL (ref >39)
LDL Calculated: 74 mg/dL (ref 0–99)
Triglycerides: 141 mg/dL (ref 0–149)
VLDL CHOLESTEROL CAL: 28 mg/dL (ref 5–40)

## 2017-08-27 LAB — HEMOGLOBIN A1C
ESTIMATED AVERAGE GLUCOSE: 177 mg/dL
Hgb A1c MFr Bld: 7.8 % — ABNORMAL HIGH (ref 4.8–5.6)

## 2017-08-27 LAB — COMPREHENSIVE METABOLIC PANEL
ALBUMIN: 4 g/dL (ref 3.5–4.7)
ALT: 17 IU/L (ref 0–32)
AST: 20 IU/L (ref 0–40)
Albumin/Globulin Ratio: 1.5 (ref 1.2–2.2)
Alkaline Phosphatase: 79 IU/L (ref 39–117)
BUN / CREAT RATIO: 19 (ref 12–28)
BUN: 24 mg/dL (ref 8–27)
Bilirubin Total: 0.3 mg/dL (ref 0.0–1.2)
CHLORIDE: 105 mmol/L (ref 96–106)
CO2: 26 mmol/L (ref 20–29)
Calcium: 9.2 mg/dL (ref 8.7–10.3)
Creatinine, Ser: 1.24 mg/dL — ABNORMAL HIGH (ref 0.57–1.00)
GFR calc non Af Amer: 39 mL/min/{1.73_m2} — ABNORMAL LOW (ref >59)
GFR, EST AFRICAN AMERICAN: 45 mL/min/{1.73_m2} — AB (ref >59)
GLUCOSE: 135 mg/dL — AB (ref 65–99)
Globulin, Total: 2.6 g/dL (ref 1.5–4.5)
Potassium: 4.6 mmol/L (ref 3.5–5.2)
Sodium: 145 mmol/L — ABNORMAL HIGH (ref 134–144)
TOTAL PROTEIN: 6.6 g/dL (ref 6.0–8.5)

## 2017-08-27 LAB — CBC WITH DIFFERENTIAL/PLATELET
Basophils Absolute: 0.1 10*3/uL (ref 0.0–0.2)
Basos: 2 %
EOS (ABSOLUTE): 0.2 10*3/uL (ref 0.0–0.4)
Eos: 3 %
HEMOGLOBIN: 10.4 g/dL — AB (ref 11.1–15.9)
Hematocrit: 31.4 % — ABNORMAL LOW (ref 34.0–46.6)
IMMATURE GRANS (ABS): 0.1 10*3/uL (ref 0.0–0.1)
IMMATURE GRANULOCYTES: 1 %
Lymphocytes Absolute: 2.9 10*3/uL (ref 0.7–3.1)
Lymphs: 41 %
MCH: 37.7 pg — ABNORMAL HIGH (ref 26.6–33.0)
MCHC: 33.1 g/dL (ref 31.5–35.7)
MCV: 114 fL — ABNORMAL HIGH (ref 79–97)
MONOCYTES: 11 %
Monocytes Absolute: 0.8 10*3/uL (ref 0.1–0.9)
NEUTROS PCT: 42 %
Neutrophils Absolute: 3 10*3/uL (ref 1.4–7.0)
Platelets: 338 10*3/uL (ref 150–379)
RBC: 2.76 x10E6/uL — AB (ref 3.77–5.28)
RDW: 16.7 % — ABNORMAL HIGH (ref 12.3–15.4)
WBC: 7.2 10*3/uL (ref 3.4–10.8)

## 2017-08-29 ENCOUNTER — Telehealth: Payer: Self-pay

## 2017-08-29 NOTE — Telephone Encounter (Signed)
-----   Message from Maple Hudsonichard L Gilbert Jr., MD sent at 08/29/2017  9:10 AM EDT ----- Stable.

## 2017-08-29 NOTE — Telephone Encounter (Signed)
LMTCB 08/29/2017  Thanks,   -Nirvaan Frett  

## 2017-08-29 NOTE — Telephone Encounter (Signed)
LMTCB 08/29/2017  Thanks,   -Laura  

## 2017-08-29 NOTE — Telephone Encounter (Signed)
-----   Message from Maple Hudsonichard L Gilbert Jr., MD sent at 08/29/2017  9:11 AM EDT ----- ABI ok.

## 2017-08-30 LAB — HM DIABETES EYE EXAM

## 2017-08-30 NOTE — Telephone Encounter (Signed)
LMTCB

## 2017-08-31 NOTE — Telephone Encounter (Signed)
Left detailed message on pt's vm. Okay per dpr.  

## 2017-09-06 ENCOUNTER — Telehealth: Payer: Self-pay

## 2017-09-06 ENCOUNTER — Other Ambulatory Visit: Payer: Self-pay

## 2017-09-06 DIAGNOSIS — R928 Other abnormal and inconclusive findings on diagnostic imaging of breast: Secondary | ICD-10-CM

## 2017-09-06 NOTE — Telephone Encounter (Signed)
Pt needs a FU breast US ordered per letter that she received from [place].

## 2017-09-09 ENCOUNTER — Encounter: Payer: Self-pay | Admitting: Family Medicine

## 2017-09-16 ENCOUNTER — Other Ambulatory Visit: Payer: Self-pay | Admitting: Family Medicine

## 2017-09-19 NOTE — Telephone Encounter (Signed)
[facility] [address] fax[facility] refill request for the following medications:  tamsulosin (FLOMAX) 0.4 MG CAPS capsule   90 day supply  Last Rx: 03/22/17 LOV: 08/24/17 Please advise. Thanks TNP

## 2017-09-21 ENCOUNTER — Telehealth: Payer: Self-pay

## 2017-09-21 NOTE — Telephone Encounter (Signed)
LMTCB  Patient will need to have face to face in order to fill out paperwork for seat lift. Faorm on [employer]'s desk

## 2017-09-29 NOTE — Telephone Encounter (Signed)
Called pt concerning form. Patient stated that for now she wants us to hold on to the form until she contacts the company. She has questions she wants to ask them about the lift seat. Patient stated she will call us back to scheduled a face to face after she speaks to them.

## 2017-10-13 ENCOUNTER — Other Ambulatory Visit: Payer: Medicare Other

## 2017-10-20 ENCOUNTER — Ambulatory Visit
Admission: RE | Admit: 2017-10-20 | Discharge: 2017-10-20 | Disposition: A | Discharge status: Home / Self Care | Payer: Medicare Other | Source: Ambulatory Visit | Attending: Family Medicine | Admitting: Family Medicine

## 2017-10-20 DIAGNOSIS — Z09 Encounter for follow-up examination after completed treatment for conditions other than malignant neoplasm: Secondary | ICD-10-CM | POA: Insufficient documentation

## 2017-10-20 DIAGNOSIS — R928 Other abnormal and inconclusive findings on diagnostic imaging of breast: Secondary | ICD-10-CM | POA: Insufficient documentation

## 2017-10-24 ENCOUNTER — Other Ambulatory Visit: Payer: Self-pay | Admitting: Family Medicine

## 2017-10-24 DIAGNOSIS — G629 Polyneuropathy, unspecified: Secondary | ICD-10-CM

## 2017-10-24 NOTE — Telephone Encounter (Signed)
[facility] pharmacy faxed a refill request for the following medication. Thanks CC  LYRICA 25 MG capsule

## 2017-10-25 MED ORDER — PREGABALIN 25 MG PO CAPS: 25.0000 mg | ORAL_CAPSULE | Freq: Every day | ORAL | 5 refills | Status: DC

## 2017-10-27 ENCOUNTER — Encounter: Payer: Self-pay | Admitting: Family Medicine

## 2017-10-27 ENCOUNTER — Ambulatory Visit: Payer: Medicare Other | Admitting: Family Medicine

## 2017-10-27 VITALS — BP 126/68 | HR 80 | Temp 97.8°F | Resp 16

## 2017-10-27 DIAGNOSIS — G629 Polyneuropathy, unspecified: Secondary | ICD-10-CM | POA: Diagnosis not present

## 2017-10-27 DIAGNOSIS — M48061 Spinal stenosis, lumbar region without neurogenic claudication: Secondary | ICD-10-CM

## 2017-10-27 DIAGNOSIS — M545 Low back pain, unspecified: Secondary | ICD-10-CM

## 2017-10-27 NOTE — Progress Notes (Signed)
Patient: Renee Carpenter Female    DOB: August 07, 1925   84 y.o.   MRN: [id number] Visit Date: 10/27/2017  Today's Provider: Megan Mans, MD   Chief Complaint  Patient presents with  . needs to discuss chair lift   Subjective:    HPI Patient comes in today wanting to discuss a lift seat.   She also has a few other complaints. She reports that the pain in her right hip seems to have gotten worse. She reports that the pain sometimes radiates to her right leg.   She was also seen by Neurology about 1 month ago. She was started on Carbidopa/levodopa 25/100mg  TID. Patient feels that it is making her tremors worse.     Allergies  Allergen Reactions  . Acetaminophen Itching and Other (See Comments)    Stomach upset  . Aspirin Itching and Other (See Comments)    Stomach pain  . Metformin And Related     nausea  . Other Other (See Comments)    Novocaine, strange feeling  . Sulfa Antibiotics Other (See Comments)  . Tizanidine     Diarrhea, almost passed out, felt dizzy and confused  . Caffeine Palpitations  . Ginger Palpitations  . Ibuprofen Rash     Current Outpatient Medications:  .  acetaminophen (TYLENOL) 500 MG tablet, Take 500 mg by mouth every 6 (six) hours as needed., Disp: , Rfl:  .  aspirin EC 81 MG tablet, Take 81 mg by mouth daily. , Disp: , Rfl:  .  Calcium-Magnesium-Vitamin D (CALCIUM 500 PO), Take by mouth daily. , Disp: , Rfl:  .  calcium-vitamin D (OSCAL WITH D) 500-200 MG-UNIT tablet, Take 1 tablet by mouth 2 (two) times daily., Disp: , Rfl:  .  Coenzyme Q10 (CO Q10) 200 MG CAPS, Take by mouth daily. , Disp: , Rfl:  .  DULoxetine (CYMBALTA) 20 MG capsule, Take 1 capsule (20 mg total) by mouth daily. (Patient taking differently: Take 20 mg by mouth 2 (two) times daily. ), Disp: 30 capsule, Rfl: 3 .  esomeprazole (NEXIUM) 40 MG capsule, TAKE 1 CAPSULE BY MOUTH EVERY DAY, Disp: 30 capsule, Rfl: 11 .  fish oil-omega-3 fatty acids 1000 MG capsule, Take  1 g by mouth 2 (two) times daily.  , Disp: , Rfl:  .  gabapentin (NEURONTIN) 600 MG tablet, Take 1 tablet (600 mg total) by mouth 3 (three) times daily., Disp: 270 tablet, Rfl: 3 .  glimepiride (AMARYL) 4 MG tablet, Take 1 tablet (4 mg total) by mouth daily before breakfast., Disp: 30 tablet, Rfl: 12 .  MAGNESIUM-OXIDE 400 (241.3 Mg) MG tablet, TAKE 1 TABLET BY MOUTH TWICE DAILY, Disp: 60 tablet, Rfl: 11 .  MULTIPLE VITAMIN PO, Take by mouth., Disp: , Rfl:  .  oxyCODONE (OXY IR/ROXICODONE) 5 MG immediate release tablet, TK SS TO ONE T PO Q 6 H PRN P, Disp: , Rfl: 0 .  pregabalin (LYRICA) 25 MG capsule, Take 1 capsule (25 mg total) by mouth daily., Disp: 30 capsule, Rfl: 5 .  ranitidine (ZANTAC) 150 MG capsule, Take 1 capsule (150 mg total) by mouth 2 (two) times daily., Disp: 60 capsule, Rfl: 11 .  rOPINIRole (REQUIP) 1 MG tablet, Take 1 mg by mouth. 1/2 tablet four times daily., Disp: , Rfl:  .  senna-docusate (SENOKOT-S) 8.6-50 MG tablet, Take 1 tablet by mouth daily as needed., Disp: , Rfl:  .  tamsulosin (FLOMAX) 0.4 MG CAPS capsule, TAKE 1 CAPSULE(0.4  MG) BY MOUTH DAILY, Disp: 90 capsule, Rfl: 3 .  conjugated estrogens (PREMARIN) vaginal cream, Place 1 Applicatorful vaginally daily. (Patient not taking: Reported on 08/24/2017), Disp: 42.5 g, Rfl: 12 .  estrogens, conjugated, (PREMARIN) 0.3 MG tablet, TAKE 1 TABLET BY MOUTH EVERY DAY FOR 21 DAYS THEN DO NOT TAKE FOR 7 DAYS (Patient not taking: Reported on 08/24/2017), Disp: 30 tablet, Rfl: 11 .  glucose blood (ACCU-CHEK AVIVA PLUS) test strip, Check blood sugar once daily, fasting Use as instructed, Disp: 100 each, Rfl: 12 .  hydroxypropyl methylcellulose (ISOPTO TEARS) 2.5 % ophthalmic solution, Place 1 drop into both eyes 3 (three) times daily as needed. Dry eyes , Disp: , Rfl:  .  Lancets (ACCU-CHEK SOFT TOUCH) lancets, Use as instructed, Disp: 100 each, Rfl: 12 .  losartan (COZAAR) 25 MG tablet, TAKE 1 TABLET BY MOUTH EVERY DAY, Disp: 90  tablet, Rfl: 3 .  urea (CARMOL) 40 % CREA, Apply topically., Disp: , Rfl:   Review of Systems  Constitutional: Negative for activity change, appetite change, chills, diaphoresis, fatigue, fever and unexpected weight change.  HENT: Negative.   Eyes: Negative.   Respiratory: Negative.   Cardiovascular: Negative for chest pain, palpitations and leg swelling.  Gastrointestinal: Negative.   Endocrine: Negative.   Musculoskeletal: Positive for arthralgias and myalgias.  Allergic/Immunologic: Negative.   Neurological: Positive for tremors. Negative for dizziness and headaches.  Hematological: Negative.     Social History   Tobacco Use  . Smoking status: Never Smoker  . Smokeless tobacco: Never Used  Substance Use Topics  . Alcohol use: No   Objective:   BP 126/68 (BP Location: Left Arm, Patient Position: Sitting, Cuff Size: Normal)   Pulse 80   Temp 97.8 F (36.6 C)   Resp 16   SpO2 94%  Vitals:   10/27/17 1506  BP: 126/68  Pulse: 80  Resp: 16  Temp: 97.8 F (36.6 C)  SpO2: 94%     Physical Exam  Constitutional: She is oriented to person, place, and time. She appears well-developed and well-nourished.  HENT:  Head: Normocephalic and atraumatic.  Eyes: Conjunctivae are normal. No scleral icterus.  Neck: No thyromegaly present.  Cardiovascular: Normal rate, regular rhythm and normal heart sounds.  Pulmonary/Chest: Effort normal and breath sounds normal.  Abdominal: Soft.  Musculoskeletal: She exhibits no edema.  Neurological: She is alert and oriented to person, place, and time.  Skin: Skin is warm and dry.  Psychiatric: She has a normal mood and affect. Her behavior is normal. Judgment and thought content normal.        Assessment & Plan:     OA/DDD Pt would benefit from lift chair.  Neuropathy     I have done the exam and reviewed the chart and it is accurate to the best of my knowledge. Dentist has been used and  any errors in dictation or  transcription are unintentional. Julieanne Manson M.D. [facility]  Megan Mans, MD  [facility]

## 2017-11-04 ENCOUNTER — Other Ambulatory Visit: Payer: Self-pay | Admitting: Family Medicine

## 2017-11-04 DIAGNOSIS — I10 Essential (primary) hypertension: Secondary | ICD-10-CM

## 2017-11-29 ENCOUNTER — Other Ambulatory Visit: Payer: Self-pay | Admitting: Family Medicine

## 2017-11-29 DIAGNOSIS — R110 Nausea: Secondary | ICD-10-CM

## 2017-12-07 DIAGNOSIS — R259 Unspecified abnormal involuntary movements: Secondary | ICD-10-CM | POA: Insufficient documentation

## 2017-12-15 ENCOUNTER — Telehealth: Payer: Self-pay | Admitting: Family Medicine

## 2017-12-15 NOTE — Telephone Encounter (Signed)
Advised  ED 

## 2017-12-15 NOTE — Telephone Encounter (Signed)
Oral surgeon needed to know if the patient has ever been on a bisphosphonate.  They are concerned that she has some bone necrosis and she may need to have some surgery.  I do not see that she is currently or has been in the past.   I will call them back and let them know that I do not see anything.

## 2017-12-15 NOTE — Telephone Encounter (Signed)
Ronda with [facility] stated they saw pt on 12/13/17 and are concerned they may need to schedule pt for a procedure but are concerned about what medications pt is taking. Pamelia HoitRonda is requesting a CMA return her call to discuss pt's medication list. Please advise. Thanks TNP

## 2017-12-16 ENCOUNTER — Other Ambulatory Visit: Payer: Self-pay | Admitting: Family Medicine

## 2017-12-26 ENCOUNTER — Encounter: Payer: Self-pay | Admitting: Family Medicine

## 2017-12-26 ENCOUNTER — Ambulatory Visit (INDEPENDENT_AMBULATORY_CARE_PROVIDER_SITE_OTHER): Payer: Medicare Other | Admitting: Family Medicine

## 2017-12-26 VITALS — BP 122/60 | HR 70 | Temp 98.0°F | Resp 16 | Ht 66.0 in | Wt 177.0 lb

## 2017-12-26 DIAGNOSIS — M5137 Other intervertebral disc degeneration, lumbosacral region: Secondary | ICD-10-CM | POA: Diagnosis not present

## 2017-12-26 DIAGNOSIS — F411 Generalized anxiety disorder: Secondary | ICD-10-CM | POA: Diagnosis not present

## 2017-12-26 DIAGNOSIS — M25519 Pain in unspecified shoulder: Secondary | ICD-10-CM

## 2017-12-26 DIAGNOSIS — R531 Weakness: Secondary | ICD-10-CM

## 2017-12-26 DIAGNOSIS — E118 Type 2 diabetes mellitus with unspecified complications: Secondary | ICD-10-CM | POA: Diagnosis not present

## 2017-12-26 DIAGNOSIS — G629 Polyneuropathy, unspecified: Secondary | ICD-10-CM | POA: Diagnosis not present

## 2017-12-26 DIAGNOSIS — R2689 Other abnormalities of gait and mobility: Secondary | ICD-10-CM

## 2017-12-26 DIAGNOSIS — G2581 Restless legs syndrome: Secondary | ICD-10-CM | POA: Diagnosis not present

## 2017-12-26 LAB — POCT GLYCOSYLATED HEMOGLOBIN (HGB A1C): HEMOGLOBIN A1C: 7.5 % — AB (ref 4.0–5.6)

## 2017-12-26 NOTE — Progress Notes (Signed)
Patient: Renee Carpenter Female    DOB: Feb 21, 1926   84 y.o.   MRN: [id number] Visit Date: 12/26/2017  Today's Provider: Megan Mans, MD   Chief Complaint  Patient presents with  . Diabetes  . Muscle Pain   Subjective:    HPI  Diabetes Mellitus Type II, Follow-up:   Lab Results  Component Value Date   HGBA1C 7.5 (A) 12/26/2017   HGBA1C 7.8 (H) 08/26/2017   HGBA1C 9.0 05/23/2017    Last seen for diabetes 4 months ago.  Management since then includes no changes. She reports good compliance with treatment. She is not having side effects.  Current symptoms include none and have been stable. Home blood sugar records: fasting range: 120s at the lowest  Episodes of hypoglycemia? no   Most Recent Eye Exam: up to date Weight trend: stable Prior visit with dietician: no Current diet: on average, 2 meals per day Current exercise: no regular exercise  Pertinent Labs:    Component Value Date/Time   CHOL 149 08/26/2017 1003   TRIG 141 08/26/2017 1003   HDL 47 08/26/2017 1003   LDLCALC 74 08/26/2017 1003   CREATININE 1.24 (H) 08/26/2017 1003   CREATININE 1.12 02/06/2014 1958    Wt Readings from Last 3 Encounters:  12/26/17 177 lb (80.3 kg)  01/19/17 176 lb 12.8 oz (80.2 kg)  01/19/17 176 lb 12.8 oz (80.2 kg)     Patient also mentions that she has had muscle pain across her chest and upper back for the last week. She reports that when she raises her arms, the muscles get tighter. Hurts most when she is putting on a shirt or jacket.   Allergies  Allergen Reactions  . Acetaminophen Itching and Other (See Comments)    Stomach upset  . Aspirin Itching and Other (See Comments)    Stomach pain  . Metformin And Related     nausea  . Other Other (See Comments)    Novocaine, strange feeling  . Sulfa Antibiotics Other (See Comments)  . Tizanidine     Diarrhea, almost passed out, felt dizzy and confused  . Caffeine Palpitations  . Ginger Palpitations  .  Ibuprofen Rash     Current Outpatient Medications:  .  acetaminophen (TYLENOL) 500 MG tablet, Take 500 mg by mouth every 6 (six) hours as needed., Disp: , Rfl:  .  aspirin EC 81 MG tablet, Take 81 mg by mouth daily. , Disp: , Rfl:  .  Calcium-Magnesium-Vitamin D (CALCIUM 500 PO), Take by mouth daily. , Disp: , Rfl:  .  calcium-vitamin D (OSCAL WITH D) 500-200 MG-UNIT tablet, Take 1 tablet by mouth 2 (two) times daily., Disp: , Rfl:  .  Coenzyme Q10 (CO Q10) 200 MG CAPS, Take by mouth daily. , Disp: , Rfl:  .  DULoxetine (CYMBALTA) 20 MG capsule, Take 1 capsule (20 mg total) by mouth daily. (Patient taking differently: Take 20 mg by mouth 2 (two) times daily. ), Disp: 30 capsule, Rfl: 3 .  esomeprazole (NEXIUM) 40 MG capsule, TAKE 1 CAPSULE BY MOUTH EVERY DAY, Disp: 30 capsule, Rfl: 5 .  fish oil-omega-3 fatty acids 1000 MG capsule, Take 1 g by mouth 2 (two) times daily.  , Disp: , Rfl:  .  gabapentin (NEURONTIN) 600 MG tablet, Take 1 tablet (600 mg total) by mouth 3 (three) times daily., Disp: 270 tablet, Rfl: 3 .  glimepiride (AMARYL) 4 MG tablet, Take 1 tablet (4 mg total)  by mouth daily before breakfast., Disp: 30 tablet, Rfl: 12 .  glucose blood (ACCU-CHEK AVIVA PLUS) test strip, Check blood sugar once daily, fasting Use as instructed, Disp: 100 each, Rfl: 12 .  hydroxypropyl methylcellulose (ISOPTO TEARS) 2.5 % ophthalmic solution, Place 1 drop into both eyes 3 (three) times daily as needed. Dry eyes , Disp: , Rfl:  .  Lancets (ACCU-CHEK SOFT TOUCH) lancets, Use as instructed, Disp: 100 each, Rfl: 12 .  losartan (COZAAR) 25 MG tablet, TAKE 1 TABLET BY MOUTH EVERY DAY, Disp: 90 tablet, Rfl: 3 .  MAGNESIUM-OXIDE 400 (241.3 Mg) MG tablet, TAKE 1 TABLET BY MOUTH TWICE DAILY, Disp: 60 tablet, Rfl: 11 .  MULTIPLE VITAMIN PO, Take by mouth., Disp: , Rfl:  .  oxyCODONE (OXY IR/ROXICODONE) 5 MG immediate release tablet, TK SS TO ONE T PO Q 6 H PRN P, Disp: , Rfl: 0 .  ranitidine (ZANTAC) 150 MG  tablet, TAKE 1 TABLET(150 MG) BY MOUTH TWICE DAILY, Disp: 60 tablet, Rfl: 11 .  rOPINIRole (REQUIP) 1 MG tablet, Take 1 mg by mouth. 1/2 tablet four times daily., Disp: , Rfl:  .  senna-docusate (SENOKOT-S) 8.6-50 MG tablet, Take 1 tablet by mouth daily as needed., Disp: , Rfl:  .  tamsulosin (FLOMAX) 0.4 MG CAPS capsule, TAKE 1 CAPSULE(0.4 MG) BY MOUTH DAILY, Disp: 90 capsule, Rfl: 3 .  urea (CARMOL) 40 % CREA, Apply topically., Disp: , Rfl:  .  conjugated estrogens (PREMARIN) vaginal cream, Place 1 Applicatorful vaginally daily. (Patient not taking: Reported on 08/24/2017), Disp: 42.5 g, Rfl: 12 .  estrogens, conjugated, (PREMARIN) 0.3 MG tablet, TAKE 1 TABLET BY MOUTH EVERY DAY FOR 21 DAYS THEN DO NOT TAKE FOR 7 DAYS (Patient not taking: Reported on 08/24/2017), Disp: 30 tablet, Rfl: 11 .  pregabalin (LYRICA) 25 MG capsule, Take 1 capsule (25 mg total) by mouth daily. (Patient not taking: Reported on 12/26/2017), Disp: 30 capsule, Rfl: 5  Review of Systems  Constitutional: Negative for activity change, appetite change, chills, diaphoresis, fatigue, fever and unexpected weight change.  HENT: Negative.   Eyes: Negative.   Respiratory: Negative for apnea, chest tightness, shortness of breath and wheezing.   Cardiovascular: Negative for chest pain, palpitations and leg swelling.  Gastrointestinal: Negative.   Endocrine: Negative.   Musculoskeletal: Positive for arthralgias, back pain, gait problem and myalgias. Negative for joint swelling, neck pain and neck stiffness.  Skin: Negative.   Allergic/Immunologic: Negative.   Neurological: Positive for tremors. Negative for dizziness, light-headedness and headaches.  Psychiatric/Behavioral: Negative.     Social History   Tobacco Use  . Smoking status: Never Smoker  . Smokeless tobacco: Never Used  Substance Use Topics  . Alcohol use: No   Objective:   BP 122/60 (BP Location: Right Arm, Patient Position: Sitting, Cuff Size: Normal)   Pulse 70    Temp 98 F (36.7 C)   Resp 16   Ht 5\' 6"  (1.676 m)   Wt 177 lb (80.3 kg)   SpO2 98%   BMI 28.57 kg/m  Vitals:   12/26/17 1336  BP: 122/60  Pulse: 70  Resp: 16  Temp: 98 F (36.7 C)  SpO2: 98%  Weight: 177 lb (80.3 kg)  Height: 5\' 6"  (1.676 m)     Physical Exam  Constitutional: She is oriented to person, place, and time. She appears well-developed and well-nourished.  HENT:  Head: Normocephalic and atraumatic.  Right Ear: External ear normal.  Left Ear: External ear normal.  Nose: Nose  normal.  Eyes: Conjunctivae are normal. No scleral icterus.  Neck: No thyromegaly present.  Cardiovascular: Normal rate, regular rhythm and normal heart sounds.  Pulmonary/Chest: Effort normal and breath sounds normal.  Musculoskeletal: She exhibits no edema.  Neurological: She is alert and oriented to person, place, and time.  Skin: Skin is warm and dry.  Psychiatric: She has a normal mood and affect. Her behavior is normal. Judgment and thought content normal.        Assessment & Plan:     1. Type 2 diabetes mellitus with complication, without long-term current use of insulin (HCC) Good control - POCT glycosylated hemoglobin (Hb A1C)--7.5   2. Balance problem  - Ambulatory referral to Physical Therapy  3. Shoulder pain, unspecified chronicity, unspecified laterality  - Ambulatory referral to Physical Therapy  4. Weakness  - Ambulatory referral to Physical Therapy 5.Neuropathic Pain Cut Gabapentin dose from 600 to 300mg  dose. Pt off of Lyrica. 6.CKD 6.Parkinsons Disease  I have done the exam and reviewed the chart and it is accurate to the best of my knowledge. Dentist has been used and  any errors in dictation or transcription are unintentional. Julieanne Manson M.D. [facility]        Megan Mans, MD  [facility]

## 2018-01-19 ENCOUNTER — Ambulatory Visit: Payer: Medicare Other

## 2018-01-23 ENCOUNTER — Ambulatory Visit: Payer: Medicare Other | Attending: Family Medicine

## 2018-01-23 ENCOUNTER — Ambulatory Visit: Payer: Medicare Other

## 2018-01-23 DIAGNOSIS — M25511 Pain in right shoulder: Secondary | ICD-10-CM | POA: Diagnosis present

## 2018-01-23 DIAGNOSIS — M25611 Stiffness of right shoulder, not elsewhere classified: Secondary | ICD-10-CM | POA: Insufficient documentation

## 2018-01-23 DIAGNOSIS — R2681 Unsteadiness on feet: Secondary | ICD-10-CM | POA: Insufficient documentation

## 2018-01-23 DIAGNOSIS — M25612 Stiffness of left shoulder, not elsewhere classified: Secondary | ICD-10-CM | POA: Insufficient documentation

## 2018-01-23 DIAGNOSIS — G8929 Other chronic pain: Secondary | ICD-10-CM | POA: Diagnosis present

## 2018-01-23 DIAGNOSIS — M25512 Pain in left shoulder: Secondary | ICD-10-CM | POA: Diagnosis not present

## 2018-01-23 NOTE — Patient Instructions (Signed)
Access Code: [email or web address]  URL: [email or web address]  Date: 01/23/2018  Prepared by: Ria CommentJason Kasim Mccorkle   Exercises  Seated Scapular Retraction - 10 reps - 2 sets - 3 second hold - 2x daily - 7x weekly  Seated Shoulder Flexion AAROM with Pulley Behind - 20 reps - 2 sets - 3 second hold - 2x daily - 7x weekly  Seated Shoulder Abduction AAROM with Pulley Behind - 20 reps - 2 sets - 3 second hold - 2x daily - 7x weekly

## 2018-01-24 ENCOUNTER — Other Ambulatory Visit: Payer: Self-pay

## 2018-01-24 NOTE — Therapy (Signed)
[place] [facility] PHYSICAL AND SPORTS MEDICINE [address] [place], [place], [address] Phone: [phone]   Fax:  [phone]  Physical Therapy Treatment  Patient Details  Name: Renee Carpenter MRN: [id number] Date of Birth: 02/26/1926 No data recorded  Encounter Date: 01/23/2018  PT End of Session - 01/24/18 0830    Visit Number  1    Number of Visits  13    Date for PT Re-Evaluation  03/07/18    Authorization Type  1/10    PT Start Time  1430    PT Stop Time  1530    PT Time Calculation (min)  60 min    Activity Tolerance  Patient tolerated treatment well    Behavior During Therapy  [facility] for tasks assessed/performed       Past Medical History:  Diagnosis Date  . Anemia    WAS ANEMIC  . Arthritis   . Blood transfusion    HAD 1 SEVERAL YRS AGO...3-4 YR  . Chronic kidney disease    SOME DECREASE IN KIDNEY FUNCTION  . Diabetes mellitus without complication (HCC)   . Hypertension   . Normal cardiac stress test    REQUESTING COPY FROM [facility]  . Normal echocardiogram    REQUESTING THAT RESULT -- DR Park BreedKAHN   . PONV (postoperative nausea and vomiting)   . Shortness of breath   . Spondylolysis 1998    Past Surgical History:  Procedure Laterality Date  . ABDOMINAL HYSTERECTOMY    . BACK SURGERY     2 CERVICAL SURGERIES ALSO  . CATARACTS     RIGHT EYE CATARACT  . CERVICAL DISCECTOMY  08/1995 and 2011   C4-,C5-6, C6-7 anterior cervical; Dr. Danielle DessElsner fusion and plating  . LAMINECTOMY     [place]comp[place]  . LUMBAR LAMINECTOMY/DECOMPRESSION MICRODISCECTOMY  04/14/2011   Procedure: LUMBAR LAMINECTOMY/DECOMPRESSION MICRODISCECTOMY;  Surgeon: Cristi LoronJeffrey D Jenkins;  Location: [facility];  Service: Neurosurgery;  Laterality: N/A;  Lumbar Three and Lumbar Four Laminectomy  . TONSILLECTOMY AND ADENOIDECTOMY  1939    There were no vitals filed for this visit.  Subjective Assessment - 01/24/18 1004    Subjective  Patient  reports that she had lumbar spine surgery in February of last year and has had difficulty walking since. 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 Pt reports that the most recent time she has walked was one day last week for only a short period of time. Pt fell from scooter onto floor apporimxately 6 months ago and landed on her R knee. In regards to shoulder pain, pt begain experiencing bilateral shoulder pain approximately 2 months ago with insidious onset. Pt states that she is having difficulty reaching into cabinents, rolling her hair and pushing up to reposition in scooter. Current pain is 2/10 and the worst pain in the past week was 6/10 when attempting to get out of her bed. In general, pt states that it "hurts to move". Pt enjoys directing choirs, going to [employer] activities, and watching TV. Pt also states that she is very interested in aquatic threapy.      Pertinent History  CKD, Parkinsons, T2DM, bilateral footdrop, lumbar sx in february 2018    Limitations  Walking;Standing;House hold activities;Lifting    Patient Stated Goals  decrease shoulder pain, walk     Currently in Pain?  Yes  Pain Score  2     Pain Location  Shoulder    Pain Orientation  Right;Left    Pain Descriptors / Indicators  Aching    Pain Type  Chronic pain    Pain Onset  More than a month ago    Pain Frequency  Occasional    Aggravating Factors   "it hurts to move"    Pain Relieving Factors  massage, tylenol         [facility] PT Assessment - 01/24/18 6962      Assessment   Medical Diagnosis  Shoulder pain and impaired balance    Onset Date/Surgical Date  11/24/17    Hand Dominance  Right    Prior Therapy  Yes      Precautions   Required Braces or Orthoses  --   Pt has bilateral AFO braces     Balance Screen   Has the patient fallen in the past 6 months  Yes    How many times?  1    Has the patient had a  decrease in activity level because of a fear of falling?   Yes    Is the patient reluctant to leave their home because of a fear of falling?   Yes      Home Environment   Living Environment  Assisted living   [facility] Equipment  Grab bars - tub/shower;Walker - 4 wheels    Additional Comments  has electric scooter, wheelchair and upright walker at home      Prior Function   Leisure  Pt enjoys watching TV, helping out with choirs, [employer] retirement activities,       Cognition   Overall Cognitive Status  Within Functional Limits for tasks assessed      Posture/Postural Control   Posture/Postural Control  Postural limitations    Postural Limitations  Rounded Shoulders;Forward head      ROM / Strength   AROM / PROM / Strength  AROM;PROM;Strength      AROM   Overall AROM   Deficits    Overall AROM Comments  Cervical AROM limited in all directions     AROM Assessment Site  Shoulder    Right/Left Shoulder  Right;Left    Right Shoulder Flexion  125 Degrees   painful   Right Shoulder ABduction  90 Degrees   after 90 deg, pt begins moving into , painful   Right Shoulder Internal Rotation  --   Hand behind back T10    Right Shoulder External Rotation  --   Hand behind head T3, painful   Left Shoulder Flexion  125 Degrees   painful   Left Shoulder ABduction  130 Degrees   painful   Left Shoulder Internal Rotation  --   Hand behind back T10, painful   Left Shoulder External Rotation  --   Hand behind head T3, painful     PROM   Overall PROM   Deficits    Overall PROM Comments  --   No pain with PROM    PROM Assessment Site  Shoulder    Right/Left Shoulder  Right;Left    Right Shoulder Flexion  135 Degrees    Right Shoulder ABduction  120 Degrees    Left Shoulder Flexion  135 Degrees    Left Shoulder ABduction  135 Degrees      Strength   Strength Assessment Site  Shoulder    Right/Left Shoulder  Right;Left    Right Shoulder Flexion  3/5    Right Shoulder  ABduction  3/5    Right Shoulder Internal Rotation  3/5    Right Shoulder External Rotation  3/5    Left Shoulder ABduction  3+/5    Left Shoulder Internal Rotation  3/5    Left Shoulder External Rotation  3/5      Palpation   Palpation comment  tenderness to palpation along B proximal biceps insertion and B pectoralis minor       Ambulation/Gait   Ambulation/Gait  --   Gait and balance to be assessed at next session     Balance   Balance Assessed  --   Balance to be assessed at next session       TREATMENT Therapeutic Exercises B shoulder flexion AAROM with pulleys x20 B shoulder abduction AAROM with pulleys x20 Seated scapular retractions 2x10 (verbal and tactile cueing required)     PT Education - 01/24/18 0828    Education provided  Yes    Education Details  Pt educated on PT plan of care. Pt also educated on HEP to compelte daily (seated shoulder flexion w/pulleys, seated shoulder abduction w/pulleys, and scapular retractions)    Person(s) Educated  Patient    Methods  Explanation;Demonstration;Handout;Tactile cues;Verbal cues    Comprehension  Verbalized understanding;Returned demonstration          PT Long Term Goals - 01/24/18 1026      PT LONG TERM GOAL #1   Title  Pt will be independent and compliant with HEP in order to maintain gains made in physical therapy and return to prior level of function.    Baseline  01/23/18: dependent with form and technique, max verbal and tactile cueing required    Time  4    Period  Weeks    Status  New    Target Date  02/21/18      PT LONG TERM GOAL #2   Title  Pt will reports worst pain in past week less than 4/10 in order to improve functional mobility and return to prior level of function.    Baseline  01/23/18: 6/10    Time  6    Period  Weeks    Status  New    Target Date  03/07/18      PT LONG TERM GOAL #3   Title  Pt will report no increase in pain while rolling her hair in order to return to prior level of  function and indpendence with ADLs.     Baseline  01/23/18: pt reports increased pain while rolling her hair    Time  6    Period  Weeks    Status  New    Target Date  03/07/18            Plan - 01/24/18 1015    Clinical Impression Statement  Pt is 84 y/o R handed female with increased bilateral shoulder pain starting approximately 2 months ago with insidious onset. Pt presents with increased bilateral shoulder dysfunction indicated by decreased shoulder AROM, decreased shoulder PROM, decreased shoulder strength and increased shoulder pain. Pt also presents with posture limitations including rounded shoulders and forward head posture.  Per pt report. pt is unable to ambulate without use of an upright walker. Due to limited access to proper equipment balance and gait will be assessed in next session. Pt will benefit from skilled PT in order to return to prior level of function.    Clinical Presentation  Stable  Clinical Decision Making  Low    Rehab Potential  Fair    Clinical Impairments Affecting Rehab Potential  (+) motivated (-) age, comorbidities, decr activity level    PT Frequency  2x / week    PT Duration  6 weeks    PT Treatment/Interventions  ADLs/Self Care Home Management;Aquatic Therapy;Biofeedback;Cryotherapy;Electrical Stimulation;Moist Heat;Ultrasound;DME Instruction;Therapeutic activities;Functional mobility training;Stair training;Gait training;Therapeutic exercise;Balance training;Neuromuscular re-education;Patient/family education;Manual techniques;Passive range of motion;Dry needling    PT Next Visit Plan  Assess balance and gait     PT Home Exercise Plan  Pulleys (flexion, abd), scapular retractions    Consulted and Agree with Plan of Care  Patient;Family member/caregiver    Family Member Consulted  Caregiver       Patient will benefit from skilled therapeutic intervention in order to improve the following deficits and impairments:  Abnormal gait, Decreased balance,  Decreased endurance, Decreased mobility, Difficulty walking, Hypomobility, Increased muscle spasms, Impaired sensation, Improper body mechanics, Decreased range of motion, Decreased activity tolerance, Decreased strength, Decreased safety awareness, Impaired UE functional use, Postural dysfunction, Pain  Visit Diagnosis: Chronic left shoulder pain  Chronic right shoulder pain  Stiffness of left shoulder, not elsewhere classified  Stiffness of right shoulder, not elsewhere classified     Problem List Patient Active Problem List   Diagnosis Date Noted  . Chronic kidney disease, stage 3, mod decreased GFR (HCC) 04/15/2016  . Ds DNA antibody positive 04/15/2016  . Radicular pain of both lower extremities 04/13/2016  . Spine pain, lumbar 04/13/2016  . ANA positive 04/06/2016  . Right upper quadrant pain 09/11/2015  . Neuropathic pain 04/15/2015  . Cervical pain 01/24/2015  . Depression 01/01/2015  . Back pain, chronic 12/14/2014  . COPD, mild (HCC) 12/14/2014  . Anxiety, generalized 12/14/2014  . Esophageal reflux 12/14/2014  . Diabetes (HCC) 12/14/2014  . Progeria syndrome 12/14/2014  . BP (high blood pressure) 12/14/2014  . Adaptive colitis 12/14/2014  . Cannot sleep 12/14/2014  . Affective disorder, major 12/14/2014  . Chronic anemia 12/14/2014  . Arthritis, degenerative 12/14/2014  . OP (osteoporosis) 12/14/2014  . Restless leg 12/14/2014  . Neuralgia neuritis, sciatic nerve 12/14/2014  . 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 Lumbar canal stenosis 02/10/2012  . DDD (degenerative disc disease), lumbosacral 11/19/2011  . Neuropathy (HCC) 02/26/2008  . Essential hypertension 02/26/2008  . Fatigue 02/26/2008  . DYSPNEA 02/26/2008    This entire session was performed under direct supervision and direction of a licensed therapist/therapist assistant . I have personally read, edited and approve of the note as written.   Lynnea Maizes PT, DPT,  GCS  Mellody Dance SPT 01/24/2018, 10:31 AM  [place] [facility] PHYSICAL AND SPORTS MEDICINE [address] [place], [address] Phone: [phone]   Fax:  [phone]  Name: Renee Carpenter MRN: [id number] Date of Birth: 30-Jul-1925

## 2018-01-25 ENCOUNTER — Ambulatory Visit: Payer: Medicare Other

## 2018-01-26 ENCOUNTER — Ambulatory Visit: Payer: Medicare Other

## 2018-01-26 VITALS — BP 121/62 | HR 87

## 2018-01-26 DIAGNOSIS — R2681 Unsteadiness on feet: Secondary | ICD-10-CM

## 2018-01-26 DIAGNOSIS — M25511 Pain in right shoulder: Secondary | ICD-10-CM

## 2018-01-26 DIAGNOSIS — G8929 Other chronic pain: Secondary | ICD-10-CM

## 2018-01-26 DIAGNOSIS — M25512 Pain in left shoulder: Principal | ICD-10-CM

## 2018-01-26 NOTE — Therapy (Signed)
[place] [facility] MAIN [facility] SERVICES [address] [place], [place], [address] Phone: [phone]   Fax:  [phone]  Physical Therapy Treatment  Patient Details  Name: Renee Carpenter MRN: [id number] Date of Birth: 12-15-1925 No data recorded  Encounter Date: 01/26/2018  PT End of Session - 01/26/18 1525    Visit Number  2    Number of Visits  13    Date for PT Re-Evaluation  03/07/18    Authorization Type  2/10    PT Start Time  1520    PT Stop Time  1605    PT Time Calculation (min)  45 min    Equipment Utilized During Treatment  Gait belt    Activity Tolerance  Patient tolerated treatment well    Behavior During Therapy  WFL for tasks assessed/performed       Past Medical History:  Diagnosis Date  . Anemia    WAS ANEMIC  . Arthritis   . Blood transfusion    HAD 1 SEVERAL YRS AGO...3-4 YR  . Chronic kidney disease    SOME DECREASE IN KIDNEY FUNCTION  . Diabetes mellitus without complication (HCC)   . Hypertension   . Normal cardiac stress test    REQUESTING COPY FROM [facility]  . Normal echocardiogram    REQUESTING THAT RESULT -- DR Park Breed   . PONV (postoperative nausea and vomiting)   . Shortness of breath   . Spondylolysis 1998    Past Surgical History:  Procedure Laterality Date  . ABDOMINAL HYSTERECTOMY    . BACK SURGERY     2 CERVICAL SURGERIES ALSO  . CATARACTS     RIGHT EYE CATARACT  . CERVICAL DISCECTOMY  08/1995 and 2011   C4-,C5-6, C6-7 anterior cervical; Dr. Danielle Dess fusion and plating  . LAMINECTOMY     decompressive; Dr. Polo Riley in [place]  . LUMBAR LAMINECTOMY/DECOMPRESSION MICRODISCECTOMY  04/14/2011   Procedure: LUMBAR LAMINECTOMY/DECOMPRESSION MICRODISCECTOMY;  Surgeon: Cristi Loron;  Location: [facility];  Service: Neurosurgery;  Laterality: N/A;  Lumbar Three and Lumbar Four Laminectomy  . TONSILLECTOMY AND ADENOIDECTOMY  1939    Vitals:   01/26/18 1523  BP: 121/62  Pulse: 87  SpO2: 100%     Subjective Assessment - 01/26/18 1523    Subjective  Pt reports mild soreness in shoulders today. She has not tried any of her HEP since the initial evaluation. No specific questions or concerns currently. She feels like her balance is the biggest issue right now.    Pertinent History  Patient reports that she had lumbar spine surgery in February of last year and has had difficulty walking since. 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 Pt reports that the most recent time she has walked was one day last week for only a short period of time. Pt fell from scooter onto floor apporimxately 6 months ago and landed on her R knee. In regards to shoulder pain, pt begain experiencing bilateral shoulder pain approximately 2 months ago with insidious onset. Pt states that she is having difficulty reaching into cabinents, rolling her hair and pushing up to reposition in scooter. Current pain is 2/10 and the worst pain in the past week was 6/10 when attempting to get out of her bed. In general, pt states that it "hurts to move". Pt enjoys directing choirs, going to [employer] activities, and watching TV. Pt  also states that she is very interested in aquatic threapy.      Currently in Pain?  Yes    Pain Score  1     Pain Location  Shoulder    Pain Orientation  Right;Left    Pain Descriptors / Indicators  Aching    Pain Type  Chronic pain    Pain Onset  More than a month ago          [facility] PT Assessment - 01/26/18 1541      Berg Balance Test   Sit to Stand  Needs minimal aid to stand or to stabilize    Standing Unsupported  Able to stand 2 minutes with supervision    Sitting with Back Unsupported but Feet Supported on Floor or Stool  Able to sit safely and securely 2 minutes    Stand to Sit  Sits independently, has uncontrolled descent    Transfers  Needs one person to assist    Standing Unsupported with Eyes  Closed  Able to stand 3 seconds    Standing Ubsupported with Feet Together  Needs help to attain position but able to stand for 30 seconds with feet together    From Standing, Reach Forward with Outstretched Arm  Can reach forward >12 cm safely (5")    From Standing Position, Pick up Object from Floor  Unable to pick up and needs supervision    From Standing Position, Turn to Look Behind Over each Shoulder  Turn sideways only but maintains balance    Turn 360 Degrees  Needs assistance while turning    Standing Unsupported, Alternately Place Feet on Step/Stool  Needs assistance to keep from falling or unable to try    Standing Unsupported, One Foot in [employer] balance while stepping or standing    Standing on One Leg  Unable to try or needs assist to prevent fall    Total Score  19       TREATMENT   Ther-ex  NuStep L2 x 4 minutes during history (3 minutes unbilled); Quantum leg press 75# x 20, 90# x 20  Red tband low rows seated x 15, Seated scapular retraction with regular tactile cues;   Neuromuscular Re-education Performed BERG with patient who scored 19/56; 10 meter gait speed: self-selected: 28.5s=0.35 m/s , fastest: 17.3s = 0.58 m/s Feet together static balance 30s x 2; Feet apart balance with horizontal head turns 30s x 2; Frequent LOB with min/modA+1 to prevent falls;                   PT Education - 01/26/18 1525    Education provided  Yes    Education Details  outcome measures related to balance, HEP reinforced, exercise form/technique    Person(s) Educated  Patient    Methods  Explanation    Comprehension  Verbalized understanding          PT Long Term Goals - 01/26/18 1627      PT LONG TERM GOAL #1   Title  Pt will be independent and compliant with HEP in order to maintain gains made in physical therapy and return to prior level of function.    Baseline  01/23/18: dependent with form and technique, max verbal and tactile cueing required     Time  4    Period  Weeks    Status  New      PT LONG TERM GOAL #2   Title  Pt will reports worst pain  in past week less than 4/10 in order to improve functional mobility and return to prior level of function.    Baseline  01/23/18: 6/10    Time  6    Period  Weeks    Status  New      PT LONG TERM GOAL #3   Title  Pt will report no increase in pain while rolling her hair in order to return to prior level of function and indpendence with ADLs.     Baseline  01/23/18: pt reports increased pain while rolling her hair    Time  6    Period  Weeks    Status  New      PT LONG TERM GOAL #4   Title  Pt will increase self-selected 10MWT speed by at least 0.13 m/s in order to demonstrate clinically significant improvement in community ambulation.      Baseline  01/26/18: self-selected: 28.5s=0.35 m/s , fastest: 17.3s = 0.58 m/s    Time  6    Period  Weeks    Status  New    Target Date  03/07/18      PT LONG TERM GOAL #5   Title  Pt will improve BERG by at least 3 points in order to demonstrate clinically significant improvement in balance.     Baseline  01/26/18: 19/56    Time  6    Period  Weeks    Status  New    Target Date  03/07/18            Plan - 01/26/18 1530    Clinical Impression Statement  Pt with very significant balance impairments. She scored a 19/56 on the BERG which indicates she is a very high fall risk and is highly likely to suffer recurrent falls in the future. In addition her self-selected 9235m gait speed is 0.35 m/s which is below requires speeds for community ambulation. Pt will benefit from addition of balance exercises to her shoulder therapy to improve her balance and decrease her risk for falls.     Rehab Potential  Fair    Clinical Impairments Affecting Rehab Potential  (+) motivated (-) age, comorbidities, decr activity level    PT Frequency  2x / week    PT Duration  6 weeks    PT Treatment/Interventions  ADLs/Self Care Home Management;Aquatic  Therapy;Biofeedback;Cryotherapy;Electrical Stimulation;Moist Heat;Ultrasound;DME Instruction;Therapeutic activities;Functional mobility training;Stair training;Gait training;Therapeutic exercise;Balance training;Neuromuscular re-education;Patient/family education;Manual techniques;Passive range of motion;Dry needling    PT Next Visit Plan  Progress LE strength/balance, Shoulder/periscapular strengthening/stretching as appropriate based on pain    PT Home Exercise Plan  Pulleys (flexion, abd), scapular retractions    Consulted and Agree with Plan of Care  Patient;Family member/caregiver    Family Member Consulted  Caregiver       Patient will benefit from skilled therapeutic intervention in order to improve the following deficits and impairments:  Abnormal gait, Decreased balance, Decreased endurance, Decreased mobility, Difficulty walking, Hypomobility, Increased muscle spasms, Impaired sensation, Improper body mechanics, Decreased range of motion, Decreased activity tolerance, Decreased strength, Decreased safety awareness, Impaired UE functional use, Postural dysfunction, Pain  Visit Diagnosis: Chronic left shoulder pain  Chronic right shoulder pain  Unsteadiness on feet     Problem List Patient Active Problem List   Diagnosis Date Noted  . Chronic kidney disease, stage 3, mod decreased GFR (HCC) 04/15/2016  . Ds DNA antibody positive 04/15/2016  . Radicular pain of both lower extremities 04/13/2016  . Spine pain, lumbar  04/13/2016  . ANA positive 04/06/2016  . Right upper quadrant pain 09/11/2015  . Neuropathic pain 04/15/2015  . Cervical pain 01/24/2015  . Depression 01/01/2015  . Back pain, chronic 12/14/2014  . COPD, mild (HCC) 12/14/2014  . Anxiety, generalized 12/14/2014  . Esophageal reflux 12/14/2014  . Diabetes (HCC) 12/14/2014  . Progeria syndrome 12/14/2014  . BP (high blood pressure) 12/14/2014  . Adaptive colitis 12/14/2014  . Cannot sleep 12/14/2014  .  Affective disorder, major 12/14/2014  . Chronic anemia 12/14/2014  . Arthritis, degenerative 12/14/2014  . OP (osteoporosis) 12/14/2014  . Restless leg 12/14/2014  . Neuralgia neuritis, sciatic nerve 12/14/2014  . 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 Lumbar canal stenosis 02/10/2012  . DDD (degenerative disc disease), lumbosacral 11/19/2011  . Neuropathy (HCC) 02/26/2008  . Essential hypertension 02/26/2008  . Fatigue 02/26/2008  . DYSPNEA 02/26/2008   Lynnea Maizes PT, DPT, GCS  Terryn Redner 01/26/2018, 4:29 PM  [place] [facility] MAIN [facility] SERVICES [address] [place], [place], [address] Phone: [phone]   Fax:  [phone]  Name: DARLIS WRAGG MRN: [id number] Date of Birth: [birth year]

## 2018-01-30 ENCOUNTER — Ambulatory Visit: Payer: Medicare Other

## 2018-01-30 ENCOUNTER — Encounter: Payer: Self-pay | Admitting: Physical Therapy

## 2018-01-30 ENCOUNTER — Ambulatory Visit: Payer: Medicare Other | Admitting: Physical Therapy

## 2018-01-30 DIAGNOSIS — M25512 Pain in left shoulder: Principal | ICD-10-CM

## 2018-01-30 DIAGNOSIS — M25511 Pain in right shoulder: Secondary | ICD-10-CM

## 2018-01-30 DIAGNOSIS — G8929 Other chronic pain: Secondary | ICD-10-CM

## 2018-01-30 DIAGNOSIS — R2681 Unsteadiness on feet: Secondary | ICD-10-CM

## 2018-01-30 NOTE — Therapy (Signed)
[place] [facility] MAIN [facility] SERVICES [address] [place], [place], [address] Phone: [phone]   Fax:  [phone]  Physical Therapy Treatment  Patient Details  Name: Renee Carpenter MRN: [id number] Date of Birth: 1925-11-04 No data recorded  Encounter Date: 01/30/2018  PT End of Session - 01/30/18 1509    Visit Number  3    Number of Visits  13    Date for PT Re-Evaluation  03/07/18    Authorization Type  3/10    PT Start Time  1302    PT Stop Time  1345    PT Time Calculation (min)  43 min    Equipment Utilized During Treatment  Gait belt    Activity Tolerance  Patient tolerated treatment well    Behavior During Therapy  WFL for tasks assessed/performed       Past Medical History:  Diagnosis Date  . Anemia    WAS ANEMIC  . Arthritis   . Blood transfusion    HAD 1 SEVERAL YRS AGO...3-4 YR  . Chronic kidney disease    SOME DECREASE IN KIDNEY FUNCTION  . Diabetes mellitus without complication (HCC)   . Hypertension   . Normal cardiac stress test    REQUESTING COPY FROM [facility]  . Normal echocardiogram    REQUESTING THAT RESULT -- DR Park Breed   . PONV (postoperative nausea and vomiting)   . Shortness of breath   . Spondylolysis 1998    Past Surgical History:  Procedure Laterality Date  . ABDOMINAL HYSTERECTOMY    . BACK SURGERY     2 CERVICAL SURGERIES ALSO  . CATARACTS     RIGHT EYE CATARACT  . CERVICAL DISCECTOMY  08/1995 and 2011   C4-,C5-6, C6-7 anterior cervical; Dr. Danielle Dess fusion and plating  . LAMINECTOMY     decompressive; Dr. Polo Riley in [place]  . LUMBAR LAMINECTOMY/DECOMPRESSION MICRODISCECTOMY  04/14/2011   Procedure: LUMBAR LAMINECTOMY/DECOMPRESSION MICRODISCECTOMY;  Surgeon: Cristi Loron;  Location: [facility];  Service: Neurosurgery;  Laterality: N/A;  Lumbar Three and Lumbar Four Laminectomy  . TONSILLECTOMY AND ADENOIDECTOMY  1939    There were no vitals filed for this visit.  Subjective  Assessment - 01/30/18 1314    Subjective  Patient reports no pain currently; She reports doing HEP some over the weekend;     Pertinent History  Patient reports that she had lumbar spine surgery in February of last year and has had difficulty walking since. 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 Pt reports that the most recent time she has walked was one day last week for only a short period of time. Pt fell from scooter onto floor apporimxately 6 months ago and landed on her R knee. In regards to shoulder pain, pt begain experiencing bilateral shoulder pain approximately 2 months ago with insidious onset. Pt states that she is having difficulty reaching into cabinents, rolling her hair and pushing up to reposition in scooter. Current pain is 2/10 and the worst pain in the past week was 6/10 when attempting to get out of her bed. In general, pt states that it "hurts to move". Pt enjoys directing choirs, going to [employer] activities, and watching TV. Pt also states that she is very interested in aquatic threapy.      Currently in Pain?  No/denies    Pain Onset  More than a month  ago           TREATMENT: Seated red tband BUE shoulder scapular retraction 2x15; Posterior shoulder rolls x15 Seated: 2# bar shoulder flexion 2x5- reports increased shoulder discomfort with increased ROM; exhibits slumped  5# bicep curl x12 bilaterally; Patient denies any pain in shoulder with UE exercise.  Required min Vcs to improve erect posture while sitting unsupported;   Standing on airex: -alternate march x15 bilaterally with 1 rail assist; -feet apart, unsupported head turns side/side, up/down x5 reps each; -feet apart, alternate UE lift x5 bilaterally; -alternate toe taps to 6 inch step x15 Bilaterally with 2-1 rail assist; -staggered stance (modified tandem) BUE shoulder abduction x10; -mini squat with HHA  x10 reps with cues for positioning to reduce joint discomfort; Patient required min VCs for balance stability, including to increase trunk control for less loss of balance with smaller base of support  Standing on firm surface: -feet slightly apart, cues for erect posture, alternate UE lift x5 bilaterally; patient able to keep balance but exhibits increased forward head and slumped posture without rail assist;  Patient denies any increase in pain following treatment session;                PT Education - 01/30/18 1509    Education provided  Yes    Education Details  shoulder ROM/strengthening; posture re-education, balance;     Person(s) Educated  Patient    Methods  Explanation;Verbal cues    Comprehension  Verbalized understanding;Returned demonstration;Verbal cues required;Need further instruction          PT Long Term Goals - 01/26/18 1627      PT LONG TERM GOAL #1   Title  Pt will be independent and compliant with HEP in order to maintain gains made in physical therapy and return to prior level of function.    Baseline  01/23/18: dependent with form and technique, max verbal and tactile cueing required    Time  4    Period  Weeks    Status  New      PT LONG TERM GOAL #2   Title  Pt will reports worst pain in past week less than 4/10 in order to improve functional mobility and return to prior level of function.    Baseline  01/23/18: 6/10    Time  6    Period  Weeks    Status  New      PT LONG TERM GOAL #3   Title  Pt will report no increase in pain while rolling her hair in order to return to prior level of function and indpendence with ADLs.     Baseline  01/23/18: pt reports increased pain while rolling her hair    Time  6    Period  Weeks    Status  New      PT LONG TERM GOAL #4   Title  Pt will increase self-selected 10MWT speed by at least 0.13 m/s in order to demonstrate clinically significant improvement in community ambulation.      Baseline   01/26/18: self-selected: 28.5s=0.35 m/s , fastest: 17.3s = 0.58 m/s    Time  6    Period  Weeks    Status  New    Target Date  03/07/18      PT LONG TERM GOAL #5   Title  Pt will improve BERG by at least 3 points in order to demonstrate clinically significant improvement in balance.     Baseline  01/26/18: 19/56    Time  6    Period  Weeks    Status  New    Target Date  03/07/18            Plan - 01/30/18 1510    Clinical Impression Statement  Patient instructed in advanced postural strengthening exercise to improve erect posture in unsupported standing; progressed balance activities with standing on compliant surface with reduced rail assist to challenge standing balance; Patient unable to keep balance lifting both arms while standing on compliant surface. Each times she does lift her hands she exhibits slumped posture with posterior trunk lean; Patient would benefit from additional skilled PT intervention to improve strength, balance and gait safety;     Rehab Potential  Fair    Clinical Impairments Affecting Rehab Potential  (+) motivated (-) age, comorbidities, decr activity level    PT Frequency  2x / week    PT Duration  6 weeks    PT Treatment/Interventions  ADLs/Self Care Home Management;Aquatic Therapy;Biofeedback;Cryotherapy;Electrical Stimulation;Moist Heat;Ultrasound;DME Instruction;Therapeutic activities;Functional mobility training;Stair training;Gait training;Therapeutic exercise;Balance training;Neuromuscular re-education;Patient/family education;Manual techniques;Passive range of motion;Dry needling    PT Next Visit Plan  Progress LE strength/balance, Shoulder/periscapular strengthening/stretching as appropriate based on pain    PT Home Exercise Plan  Pulleys (flexion, abd), scapular retractions    Consulted and Agree with Plan of Care  Patient;Family member/caregiver    Family Member Consulted  Caregiver       Patient will benefit from skilled therapeutic  intervention in order to improve the following deficits and impairments:  Abnormal gait, Decreased balance, Decreased endurance, Decreased mobility, Difficulty walking, Hypomobility, Increased muscle spasms, Impaired sensation, Improper body mechanics, Decreased range of motion, Decreased activity tolerance, Decreased strength, Decreased safety awareness, Impaired UE functional use, Postural dysfunction, Pain  Visit Diagnosis: Chronic left shoulder pain  Chronic right shoulder pain  Unsteadiness on feet     Problem List Patient Active Problem List   Diagnosis Date Noted  . Chronic kidney disease, stage 3, mod decreased GFR (HCC) 04/15/2016  . Ds DNA antibody positive 04/15/2016  . Radicular pain of both lower extremities 04/13/2016  . Spine pain, lumbar 04/13/2016  . ANA positive 04/06/2016  . Right upper quadrant pain 09/11/2015  . Neuropathic pain 04/15/2015  . Cervical pain 01/24/2015  . Depression 01/01/2015  . Back pain, chronic 12/14/2014  . COPD, mild (HCC) 12/14/2014  . Anxiety, generalized 12/14/2014  . Esophageal reflux 12/14/2014  . Diabetes (HCC) 12/14/2014  . Progeria syndrome 12/14/2014  . BP (high blood pressure) 12/14/2014  . Adaptive colitis 12/14/2014  . Cannot sleep 12/14/2014  . Affective disorder, major 12/14/2014  . Chronic anemia 12/14/2014  . Arthritis, degenerative 12/14/2014  . OP (osteoporosis) 12/14/2014  . Restless leg 12/14/2014  . Neuralgia neuritis, sciatic nerve 12/14/2014  . 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 Lumbar canal stenosis 02/10/2012  . DDD (degenerative disc disease), lumbosacral 11/19/2011  . Neuropathy (HCC) 02/26/2008  . Essential hypertension 02/26/2008  . Fatigue 02/26/2008  . DYSPNEA 02/26/2008    Nashaly Dorantes PT, DPT 01/30/2018, 3:39 PM  [facility] MAIN [facility] [facility] SERVICES [address] [place], [place], [address] Phone: [phone]   Fax:   [phone]  Name: Renee Carpenter MRN: [id number] Date of Birth: [birth year]

## 2018-02-02 ENCOUNTER — Ambulatory Visit: Payer: Medicare Other

## 2018-02-09 ENCOUNTER — Ambulatory Visit: Payer: Medicare Other

## 2018-02-16 ENCOUNTER — Ambulatory Visit: Payer: Medicare Other

## 2018-02-20 ENCOUNTER — Ambulatory Visit: Payer: Medicare Other

## 2018-02-23 ENCOUNTER — Ambulatory Visit: Payer: Medicare Other | Attending: Family Medicine

## 2018-02-23 DIAGNOSIS — G8929 Other chronic pain: Secondary | ICD-10-CM

## 2018-02-23 DIAGNOSIS — M25512 Pain in left shoulder: Secondary | ICD-10-CM | POA: Diagnosis not present

## 2018-02-23 DIAGNOSIS — M25511 Pain in right shoulder: Secondary | ICD-10-CM | POA: Insufficient documentation

## 2018-02-23 DIAGNOSIS — M25611 Stiffness of right shoulder, not elsewhere classified: Secondary | ICD-10-CM

## 2018-02-23 DIAGNOSIS — R262 Difficulty in walking, not elsewhere classified: Secondary | ICD-10-CM

## 2018-02-23 DIAGNOSIS — M25612 Stiffness of left shoulder, not elsewhere classified: Secondary | ICD-10-CM

## 2018-02-23 DIAGNOSIS — R2681 Unsteadiness on feet: Secondary | ICD-10-CM

## 2018-02-23 NOTE — Therapy (Signed)
[place] [facility] MAIN [facility] SERVICES [address] [place], [place], [address] Phone: [phone]   Fax:  [phone]  Physical Therapy Treatment  Patient Details  Name: Renee Carpenter MRN: [id number] Date of Birth: 25-Nov-1925 No data recorded  Encounter Date: 02/23/2018  PT End of Session - 02/23/18 1531    Visit Number  4    Number of Visits  13    Date for PT Re-Evaluation  03/07/18    Authorization Type  4/10    PT Start Time  1432    PT Stop Time  1515    PT Time Calculation (min)  43 min    Equipment Utilized During Treatment  Gait belt    Activity Tolerance  Patient tolerated treatment well    Behavior During Therapy  WFL for tasks assessed/performed       Past Medical History:  Diagnosis Date  . Anemia    WAS ANEMIC  . Arthritis   . Blood transfusion    HAD 1 SEVERAL YRS AGO...3-4 YR  . Chronic kidney disease    SOME DECREASE IN KIDNEY FUNCTION  . Diabetes mellitus without complication (HCC)   . Hypertension   . Normal cardiac stress test    REQUESTING COPY FROM [facility]  . Normal echocardiogram    REQUESTING THAT RESULT -- DR Park Breed   . PONV (postoperative nausea and vomiting)   . Shortness of breath   . Spondylolysis 1998    Past Surgical History:  Procedure Laterality Date  . ABDOMINAL HYSTERECTOMY    . BACK SURGERY     2 CERVICAL SURGERIES ALSO  . CATARACTS     RIGHT EYE CATARACT  . CERVICAL DISCECTOMY  08/1995 and 2011   C4-,C5-6, C6-7 anterior cervical; Dr. Danielle Dess fusion and plating  . LAMINECTOMY     decompressive; Dr. Polo Riley in [place]  . LUMBAR LAMINECTOMY/DECOMPRESSION MICRODISCECTOMY  04/14/2011   Procedure: LUMBAR LAMINECTOMY/DECOMPRESSION MICRODISCECTOMY;  Surgeon: Cristi Loron;  Location: [facility];  Service: Neurosurgery;  Laterality: N/A;  Lumbar Three and Lumbar Four Laminectomy  . TONSILLECTOMY AND ADENOIDECTOMY  1939    There were no vitals filed for this visit.  Subjective  Assessment - 02/23/18 1528    Subjective  Patient arrived to session for first time in 3 weeks. Had mandible surgery, not under current precautions about it. Patient has also been ill and not been compliant with HEP.     Pertinent History  Patient reports that she had lumbar spine surgery in February of last year and has had difficulty walking since. 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 Pt reports that the most recent time she has walked was one day last week for only a short period of time. Pt fell from scooter onto floor apporimxately 6 months ago and landed on her R knee. In regards to shoulder pain, pt begain experiencing bilateral shoulder pain approximately 2 months ago with insidious onset. Pt states that she is having difficulty reaching into cabinents, rolling her hair and pushing up to reposition in scooter. Current pain is 2/10 and the worst pain in the past week was 6/10 when attempting to get out of her bed. In general, pt states that it "hurts to move". Pt enjoys directing choirs, going to [employer] activities, and watching TV. Pt also states that she is very interested in aquatic threapy.  Limitations  Walking;Standing;House hold activities;Lifting    How long can you sit comfortably?  10 mins    How long can you stand comfortably?  no long    How long can you walk comfortably?  8 mins    Patient Stated Goals  decrease shoulder pain, walk     Currently in Pain?  Yes    Pain Score  5     Pain Location  Back    Pain Orientation  Right;Left    Pain Descriptors / Indicators  Aching    Pain Type  Chronic pain    Pain Onset  More than a month ago    Pain Frequency  Intermittent     hot pad placed on stomach and back to relive gas pain   TREATMENT: Standing in // bars with CGA:  Standing marches 10x each leg, one leg at a time BUE support, good clearance of BLE.  Static stand with  decreasing UE support 30 seconds, patient fearful of not holding on with UE.  Speed ladder: one UE support , one foot in each square ,6x airex pad: decreasing UE support 60 seconds Airex: 6 inch step taps 10x each leg; BUE support Airex pad: 6" step toe taps laterally 10x each leg, BUE support  Airex pad: finger tip support horizontal head turns 30 seconds   Seated:  5# bicep curl x10 bilaterally; cues for keeping wrist bent 3lb bar: horizontal press 10x, hurt R neck  Posterior shoulder rolls x15 RTB ER 12x  2lb punches LLE, no weight RLE 10x each leg Seated preacher curls 10x 2lb dumbbells Seated marches 15x each leg   Patient required min VCs for balance stability, including to increase trunk control for less loss of balance with smaller base of support                            PT Education - 02/23/18 1530    Education provided  Yes    Education Details  HEP compliance, low back pain reduction, stability and strength    Person(s) Educated  Patient    Methods  Explanation;Demonstration;Tactile cues;Verbal cues    Comprehension  Need further instruction;Verbalized understanding;Returned demonstration          PT Long Term Goals - 01/26/18 1627      PT LONG TERM GOAL #1   Title  Pt will be independent and compliant with HEP in order to maintain gains made in physical therapy and return to prior level of function.    Baseline  01/23/18: dependent with form and technique, max verbal and tactile cueing required    Time  4    Period  Weeks    Status  New      PT LONG TERM GOAL #2   Title  Pt will reports worst pain in past week less than 4/10 in order to improve functional mobility and return to prior level of function.    Baseline  01/23/18: 6/10    Time  6    Period  Weeks    Status  New      PT LONG TERM GOAL #3   Title  Pt will report no increase in pain while rolling her hair in order to return to prior level of function and indpendence with  ADLs.     Baseline  01/23/18: pt reports increased pain while rolling her hair    Time  6    Period  Weeks    Status  New      PT LONG TERM GOAL #4   Title  Pt will increase self-selected speed by at least 0.13 m/s in order to demonstrate clinically significant improvement in community ambulation.      Baseline  01/26/18: self-selected: 28.5s=0.35 m/s , fastest: 17.3s = 0.58 m/s    Time  6    Period  Weeks    Status  New    Target Date  03/07/18      PT LONG TERM GOAL #5   Title  Pt will improve BERG by at least 3 points in order to demonstrate clinically significant improvement in balance.     Baseline  01/26/18: 19/56    Time  6    Period  Weeks    Status  New    Target Date  03/07/18            Plan - 02/23/18 1532    Clinical Impression Statement  Patient presented for first session in three weeks after mandible surgery and illness. Patient initially presented with back pain that limited standing duration, however pain decreased to 1/10 by end of session. Patient requires UE assistance for stability at this time and is fearful of reduction of UE support. Patient would benefit from additional skilled PT intervention to improve strength, balance and gait safety;     Rehab Potential  Fair    Clinical Impairments Affecting Rehab Potential  (+) motivated (-) age, comorbidities, decr activity level    PT Frequency  2x / week    PT Duration  6 weeks    PT Treatment/Interventions  ADLs/Self Care Home Management;Aquatic Therapy;Biofeedback;Cryotherapy;Electrical Stimulation;Moist Heat;Ultrasound;DME Instruction;Therapeutic activities;Functional mobility training;Stair training;Gait training;Therapeutic exercise;Balance training;Neuromuscular re-education;Patient/family education;Manual techniques;Passive range of motion;Dry needling    PT Next Visit Plan  Progress LE strength/balance, Shoulder/periscapular strengthening/stretching as appropriate based on pain    PT Home Exercise  Plan  Pulleys (flexion, abd), scapular retractions    Consulted and Agree with Plan of Care  Patient;Family member/caregiver    Family Member Consulted  Caregiver       Patient will benefit from skilled therapeutic intervention in order to improve the following deficits and impairments:  Abnormal gait, Decreased balance, Decreased endurance, Decreased mobility, Difficulty walking, Hypomobility, Increased muscle spasms, Impaired sensation, Improper body mechanics, Decreased range of motion, Decreased activity tolerance, Decreased strength, Decreased safety awareness, Impaired UE functional use, Postural dysfunction, Pain  Visit Diagnosis: Chronic left shoulder pain  Chronic right shoulder pain  Unsteadiness on feet  Stiffness of left shoulder, not elsewhere classified  Stiffness of right shoulder, not elsewhere classified  Difficulty in walking, not elsewhere classified     Problem List Patient Active Problem List   Diagnosis Date Noted  . Chronic kidney disease, stage 3, mod decreased GFR (HCC) 04/15/2016  . Ds DNA antibody positive 04/15/2016  . Radicular pain of both lower extremities 04/13/2016  . Spine pain, lumbar 04/13/2016  . ANA positive 04/06/2016  . Right upper quadrant pain 09/11/2015  . Neuropathic pain 04/15/2015  . Cervical pain 01/24/2015  . Depression 01/01/2015  . Back pain, chronic 12/14/2014  . COPD, mild (HCC) 12/14/2014  . Anxiety, generalized 12/14/2014  . Esophageal reflux 12/14/2014  . Diabetes (HCC) 12/14/2014  . Progeria syndrome 12/14/2014  . BP (high blood pressure) 12/14/2014  . Adaptive colitis 12/14/2014  . Cannot sleep 12/14/2014  . Affective disorder, major 12/14/2014  . Chronic anemia 12/14/2014  . Arthritis, degenerative 12/14/2014  .  OP (osteoporosis) 12/14/2014  . Restless leg 12/14/2014  . Neuralgia neuritis, sciatic nerve 12/14/2014  . 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 Lumbar canal stenosis  02/10/2012  . DDD (degenerative disc disease), lumbosacral 11/19/2011  . Neuropathy (HCC) 02/26/2008  . Essential hypertension 02/26/2008  . Fatigue 02/26/2008  . DYSPNEA 02/26/2008   Precious Bard, PT, DPT   02/23/2018, 3:33 PM  [place] [facility] MAIN [facility] SERVICES [address] [place], [place], [address] Phone: [phone]   Fax:  [phone]  Name: Renee Carpenter MRN: [id number] Date of Birth: 02-28-1926

## 2018-02-27 ENCOUNTER — Other Ambulatory Visit: Payer: Self-pay

## 2018-02-27 ENCOUNTER — Emergency Department: Payer: Medicare Other

## 2018-02-27 ENCOUNTER — Emergency Department
Admission: EM | Admit: 2018-02-27 | Discharge: 2018-02-27 | Disposition: A | Discharge status: Home / Self Care | Payer: Medicare Other | Attending: Emergency Medicine | Admitting: Emergency Medicine

## 2018-02-27 ENCOUNTER — Ambulatory Visit: Payer: Medicare Other

## 2018-02-27 ENCOUNTER — Encounter: Payer: Self-pay | Admitting: Emergency Medicine

## 2018-02-27 DIAGNOSIS — Y929 Unspecified place or not applicable: Secondary | ICD-10-CM | POA: Diagnosis not present

## 2018-02-27 DIAGNOSIS — E1122 Type 2 diabetes mellitus with diabetic chronic kidney disease: Secondary | ICD-10-CM | POA: Diagnosis not present

## 2018-02-27 DIAGNOSIS — J449 Chronic obstructive pulmonary disease, unspecified: Secondary | ICD-10-CM | POA: Insufficient documentation

## 2018-02-27 DIAGNOSIS — I129 Hypertensive chronic kidney disease with stage 1 through stage 4 chronic kidney disease, or unspecified chronic kidney disease: Secondary | ICD-10-CM | POA: Insufficient documentation

## 2018-02-27 DIAGNOSIS — X58XXXA Exposure to other specified factors, initial encounter: Secondary | ICD-10-CM | POA: Insufficient documentation

## 2018-02-27 DIAGNOSIS — G8929 Other chronic pain: Secondary | ICD-10-CM

## 2018-02-27 DIAGNOSIS — S99822A Other specified injuries of left foot, initial encounter: Secondary | ICD-10-CM | POA: Diagnosis present

## 2018-02-27 DIAGNOSIS — S93602A Unspecified sprain of left foot, initial encounter: Secondary | ICD-10-CM

## 2018-02-27 DIAGNOSIS — Y999 Unspecified external cause status: Secondary | ICD-10-CM | POA: Insufficient documentation

## 2018-02-27 DIAGNOSIS — Z79899 Other long term (current) drug therapy: Secondary | ICD-10-CM | POA: Diagnosis not present

## 2018-02-27 DIAGNOSIS — Y939 Activity, unspecified: Secondary | ICD-10-CM | POA: Diagnosis not present

## 2018-02-27 DIAGNOSIS — S93692A Other sprain of left foot, initial encounter: Secondary | ICD-10-CM | POA: Insufficient documentation

## 2018-02-27 DIAGNOSIS — M25512 Pain in left shoulder: Principal | ICD-10-CM

## 2018-02-27 DIAGNOSIS — N183 Chronic kidney disease, stage 3 (moderate): Secondary | ICD-10-CM | POA: Diagnosis not present

## 2018-02-27 DIAGNOSIS — Z7984 Long term (current) use of oral hypoglycemic drugs: Secondary | ICD-10-CM | POA: Insufficient documentation

## 2018-02-27 DIAGNOSIS — M25611 Stiffness of right shoulder, not elsewhere classified: Secondary | ICD-10-CM

## 2018-02-27 DIAGNOSIS — R2681 Unsteadiness on feet: Secondary | ICD-10-CM

## 2018-02-27 DIAGNOSIS — M25612 Stiffness of left shoulder, not elsewhere classified: Secondary | ICD-10-CM

## 2018-02-27 DIAGNOSIS — R262 Difficulty in walking, not elsewhere classified: Secondary | ICD-10-CM

## 2018-02-27 DIAGNOSIS — Z7982 Long term (current) use of aspirin: Secondary | ICD-10-CM | POA: Diagnosis not present

## 2018-02-27 DIAGNOSIS — M25511 Pain in right shoulder: Secondary | ICD-10-CM

## 2018-02-27 NOTE — ED Notes (Signed)
See triage note  States she fell last Friday  Has bruising and swelling  Provider in with pt

## 2018-02-27 NOTE — ED Provider Notes (Signed)
[facility] Emergency Department Provider Note  ____________________________________________   First MD Initiated Contact with Patient 02/27/18 1603     (approximate)  I have reviewed the triage vital signs and the nursing notes.   HISTORY  Chief Complaint Fall    HPI Renee Carpenter is a 84 y.o. female presents emergency department complaining of left foot injury.  She states she has some much neuropathy in the left foot she does not know if it is painful or not.  She states that her foot bent underneath her while she was trying to stand.  She is wheelchair-bound and was trying to stand on her own without having something in front of her.  She denies any other injuries.  She did not hit her head.  She is not on blood thinner.  She denies any pain in her wrist or arms or her knee.    Past Medical History:  Diagnosis Date  . Anemia    WAS ANEMIC  . Arthritis   . Blood transfusion    HAD 1 SEVERAL YRS AGO...3-4 YR  . Chronic kidney disease    SOME DECREASE IN KIDNEY FUNCTION  . Diabetes mellitus without complication (HCC)   . Hypertension   . Normal cardiac stress test    REQUESTING COPY FROM [facility]  . Normal echocardiogram    REQUESTING THAT RESULT -- DR Park Breed   . PONV (postoperative nausea and vomiting)   . Shortness of breath   . Spondylolysis 1998    Patient Active Problem List   Diagnosis Date Noted  . Chronic kidney disease, stage 3, mod decreased GFR (HCC) 04/15/2016  . Ds DNA antibody positive 04/15/2016  . Radicular pain of both lower extremities 04/13/2016  . Spine pain, lumbar 04/13/2016  . ANA positive 04/06/2016  . Right upper quadrant pain 09/11/2015  . Neuropathic pain 04/15/2015  . Cervical pain 01/24/2015  . Depression 01/01/2015  . Back pain, chronic 12/14/2014  . COPD, mild (HCC) 12/14/2014  . Anxiety, generalized 12/14/2014  . Esophageal reflux 12/14/2014  . Diabetes (HCC) 12/14/2014  . Progeria syndrome  12/14/2014  . BP (high blood pressure) 12/14/2014  . Adaptive colitis 12/14/2014  . Cannot sleep 12/14/2014  . Affective disorder, major 12/14/2014  . Chronic anemia 12/14/2014  . Arthritis, degenerative 12/14/2014  . OP (osteoporosis) 12/14/2014  . Restless leg 12/14/2014  . Neuralgia neuritis, sciatic nerve 12/14/2014  . 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 Lumbar canal stenosis 02/10/2012  . DDD (degenerative disc disease), lumbosacral 11/19/2011  . Neuropathy (HCC) 02/26/2008  . Essential hypertension 02/26/2008  . Fatigue 02/26/2008  . DYSPNEA 02/26/2008    Past Surgical History:  Procedure Laterality Date  . ABDOMINAL HYSTERECTOMY    . BACK SURGERY     2 CERVICAL SURGERIES ALSO  . CATARACTS     RIGHT EYE CATARACT  . CERVICAL DISCECTOMY  08/1995 and 2011   C4-,C5-6, C6-7 anterior cervical; Dr. Danielle Dess fusion and plating  . LAMINECTOMY     decompressive; Dr. Polo Riley in [place]  . LUMBAR LAMINECTOMY/DECOMPRESSION MICRODISCECTOMY  04/14/2011   Procedure: LUMBAR LAMINECTOMY/DECOMPRESSION MICRODISCECTOMY;  Surgeon: Cristi Loron;  Location: [facility];  Service: Neurosurgery;  Laterality: N/A;  Lumbar Three and Lumbar Four Laminectomy  . TONSILLECTOMY AND ADENOIDECTOMY  1939    Prior to Admission medications   Medication Sig Start Date End Date Taking? Authorizing Provider  acetaminophen (TYLENOL) 500 MG tablet Take 500 mg by mouth every 6 (six) hours  as needed. 07/16/16   [provider]  aspirin EC 81 MG tablet Take 81 mg by mouth daily.     [provider]  Calcium-Magnesium-Vitamin D (CALCIUM 500 PO) Take by mouth daily.  08/09/11   [provider]  calcium-vitamin D (OSCAL WITH D) 500-200 MG-UNIT tablet Take 1 tablet by mouth 2 (two) times daily.    [provider]  Coenzyme Q10 (CO Q10) 200 MG CAPS Take by mouth daily.  08/09/11   [provider]  conjugated estrogens (PREMARIN) vaginal cream Place 1  Applicatorful vaginally daily. Patient not taking: Reported on 08/24/2017 05/23/17   Maple HudsonGilbert, Richard L Jr., MD  DULoxetine (CYMBALTA) 20 MG capsule Take 1 capsule (20 mg total) by mouth daily. Patient taking differently: Take 20 mg by mouth 2 (two) times daily.  03/23/16   Maple HudsonGilbert, Richard L Jr., MD  esomeprazole (NEXIUM) 40 MG capsule TAKE 1 CAPSULE BY MOUTH EVERY DAY 12/18/17   Maple HudsonGilbert, Richard L Jr., MD  estrogens, conjugated, (PREMARIN) 0.3 MG tablet TAKE 1 TABLET BY MOUTH EVERY DAY FOR 21 DAYS THEN DO NOT TAKE FOR 7 DAYS Patient not taking: Reported on 08/24/2017 02/02/16   Maple HudsonGilbert, Richard L Jr., MD  fish oil-omega-3 fatty acids 1000 MG capsule Take 1 g by mouth 2 (two) times daily.      [provider]  gabapentin (NEURONTIN) 600 MG tablet Take 1 tablet (600 mg total) by mouth 3 (three) times daily. 09/22/16   Maple HudsonGilbert, Richard L Jr., MD  glimepiride (AMARYL) 4 MG tablet Take 1 tablet (4 mg total) by mouth daily before breakfast. 06/17/17   Maple HudsonGilbert, Richard L Jr., MD  glucose blood (ACCU-CHEK AVIVA PLUS) test strip Check blood sugar once daily, fasting Use as instructed 08/04/17   Maple HudsonGilbert, Richard L Jr., MD  hydroxypropyl methylcellulose (ISOPTO TEARS) 2.5 % ophthalmic solution Place 1 drop into both eyes 3 (three) times daily as needed. Dry eyes     [provider]  Lancets (ACCU-CHEK SOFT TOUCH) lancets Use as instructed 08/04/17   Maple HudsonGilbert, Richard L Jr., MD  losartan (COZAAR) 25 MG tablet TAKE 1 TABLET BY MOUTH EVERY DAY 11/07/17   Maple HudsonGilbert, Richard L Jr., MD  MAGNESIUM-OXIDE 400 (241.3 Mg) MG tablet TAKE 1 TABLET BY MOUTH TWICE DAILY 03/17/17   Maple HudsonGilbert, Richard L Jr., MD  MULTIPLE VITAMIN PO Take by mouth. 08/09/11   [provider]  oxyCODONE (OXY IR/ROXICODONE) 5 MG immediate release tablet TK SS TO ONE T PO Q 6 H PRN P 08/18/16   [provider]  pregabalin (LYRICA) 25 MG capsule Take 1 capsule (25 mg total) by mouth daily. Patient not taking: Reported on  12/26/2017 10/25/17   Maple HudsonGilbert, Richard L Jr., MD  ranitidine (ZANTAC) 150 MG tablet TAKE 1 TABLET(150 MG) BY MOUTH TWICE DAILY 11/29/17   Maple HudsonGilbert, Richard L Jr., MD  rOPINIRole (REQUIP) 1 MG tablet Take 1 mg by mouth. 1/2 tablet four times daily. 10/10/12   [provider]  senna-docusate (SENOKOT-S) 8.6-50 MG tablet Take 1 tablet by mouth daily as needed. 07/16/16   [provider]  tamsulosin (FLOMAX) 0.4 MG CAPS capsule TAKE 1 CAPSULE(0.4 MG) BY MOUTH DAILY 09/20/17   Maple HudsonGilbert, Richard L Jr., MD  urea (CARMOL) 40 % CREA Apply topically.    [provider]    Allergies Acetaminophen; Aspirin; Metformin and related; Other; Sulfa antibiotics; Tizanidine; Caffeine; Ginger; and Ibuprofen  Family History  Problem Relation Age of Onset  . Heart failure Father   .  Heart disease Father   . Hypertension Mother   . Diabetes Mother   . Heart disease Mother   . Cancer Sister        breast  . Breast cancer Sister 35  . Cancer Maternal Grandfather   . Cancer Maternal Aunt        x3  . Breast cancer Daughter 75    Social History Social History   Tobacco Use  . Smoking status: Never Smoker  . Smokeless tobacco: Never Used  Substance Use Topics  . Alcohol use: No  . Drug use: No    Review of Systems  Constitutional: No fever/chills Eyes: No visual changes. ENT: No sore throat. Respiratory: Denies cough Genitourinary: Negative for dysuria. Musculoskeletal: Negative for back pain.  Positive for left foot pain Skin: Negative for rash.    ____________________________________________   PHYSICAL EXAM:  VITAL SIGNS: ED Triage Vitals  Enc Vitals Group     BP 02/27/18 1546 110/70     Pulse Rate 02/27/18 1546 91     Resp 02/27/18 1546 16     Temp 02/27/18 1546 98.1 F (36.7 C)     Temp Source 02/27/18 1546 Oral     SpO2 02/27/18 1546 99 %     Weight 02/27/18 1551 178 lb (80.7 kg)     Height 02/27/18 1551 5\' 4"  (1.626 m)     Head Circumference --      Peak  Flow --      Pain Score 02/27/18 1551 3     Pain Loc --      Pain Edu? --      Excl. in GC? --     Constitutional: Alert and oriented. Well appearing and in no acute distress. Eyes: Conjunctivae are normal.  Head: Atraumatic. Nose: No congestion/rhinnorhea. Mouth/Throat: Mucous membranes are moist.   Neck:  supple no lymphadenopathy noted Cardiovascular: Normal rate, regular rhythm. Heart sounds are normal Respiratory: Normal respiratory effort.  No retractions, lungs c t a  GU: deferred Musculoskeletal: FROM all extremities, warm and well perfused.  The left foot is bruised along the fourth and fifth toes.  A pulse was not palpated but was found via Doppler. Neurologic:  Normal speech and language.  Skin:  Skin is warm, dry and intact. No rash noted. Psychiatric: Mood and affect are normal. Speech and behavior are normal.  ____________________________________________   LABS (all labs ordered are listed, but only abnormal results are displayed)  Labs Reviewed - No data to display ____________________________________________   ____________________________________________  RADIOLOGY  X-ray of the left foot is negative  ____________________________________________   PROCEDURES  Procedure(s) performed: No  Procedures    ____________________________________________   INITIAL IMPRESSION / ASSESSMENT AND PLAN / ED COURSE  Pertinent labs & imaging results that were available during my care of the patient were reviewed by me and considered in my medical decision making (see chart for details).   Patient is a 84 year old female presents emergency department complaining of a left foot injury.  She denies any other injuries.  She did not hit her head or lose consciousness.  On physical exam patient appears well.  She is talkative and quite pleasant.  The left foot has bruising noted along the fourth and fifth toes.  Pulse was not palpated but found with Doppler.  X-ray  of the left foot is negative for fracture.  Explained the x-ray findings to the patient.  Since she is wheelchair-bound we will just let her put her sneakers with  the braces back on.  She is to follow-up with her regular doctor or Dr. Orland Jarred if she is not improving by next week.  She had already made an appoint with Dr. Orland Jarred for next week and I told her to keep this appointment.  She states she understands will comply with our instructions.  She was discharged in stable condition.     As part of my medical decision making, I reviewed the following data within the electronic MEDICAL RECORD NUMBER Nursing notes reviewed and incorporated, Old chart reviewed, Radiograph reviewed x-ray left foot is negative, Notes from prior ED visits and [place] Controlled Substance Database  ____________________________________________   FINAL CLINICAL IMPRESSION(S) / ED DIAGNOSES  Final diagnoses:  Foot sprain, left, initial encounter      NEW MEDICATIONS STARTED DURING THIS VISIT:  Discharge Medication List as of 02/27/2018  4:58 PM       Note:  This document was prepared using Dragon voice recognition software and may include unintentional dictation errors.    Faythe Ghee, PA-C 02/27/18 1757    Rockne Menghini, MD 02/27/18 2229

## 2018-02-27 NOTE — Therapy (Signed)
Patient presented to therapy session with severe pain in LLE with WB s/p bending foot "backwards" per patient report on Friday. . Patient unable to feel sensation in BLE due to feet. Bruising and swelling in feet and ankle noted with tenderness up the shin. Patient had positive tuning fork to tibia and fibula. Patient and caregiver in agreement to get imaging. PT took patient down to ER for imaging.   Precious BardMarina Jaekwon Mcclune, PT, DPT

## 2018-02-27 NOTE — Discharge Instructions (Addendum)
Follow-up with your regular doctor if not better in 3 to 5 days.  Return emergency department worsening. °

## 2018-02-27 NOTE — ED Triage Notes (Signed)
Pt here with c/o left foot bruising and swelling after a fall last Friday. Pt states she stood up without leaning on anything even though she is wheelchair bound with left foot numbness, caused her to fall and now bruising and swelling per pt. Tennis shoe intact at this time. Appears in NAD.

## 2018-03-01 ENCOUNTER — Ambulatory Visit: Payer: Medicare Other

## 2018-03-13 ENCOUNTER — Ambulatory Visit: Payer: Medicare Other | Attending: Family Medicine

## 2018-03-13 ENCOUNTER — Ambulatory Visit: Payer: Medicare Other

## 2018-03-13 DIAGNOSIS — R2681 Unsteadiness on feet: Secondary | ICD-10-CM | POA: Insufficient documentation

## 2018-03-13 DIAGNOSIS — M25511 Pain in right shoulder: Secondary | ICD-10-CM | POA: Insufficient documentation

## 2018-03-13 DIAGNOSIS — M25512 Pain in left shoulder: Secondary | ICD-10-CM | POA: Insufficient documentation

## 2018-03-13 DIAGNOSIS — G8929 Other chronic pain: Secondary | ICD-10-CM | POA: Insufficient documentation

## 2018-03-13 NOTE — Therapy (Signed)
[place] MAIN [facility] SERVICES [address] [place], [place], [address] Phone: [phone]   Fax:  [phone]  Physical Therapy Treatment/Discharge  Dates of reporting period 01/23/18    to   03/13/18  Patient Details  Name: Renee Carpenter MRN: [id number] Date of Birth: 20-Feb-1926 No data recorded  Encounter Date: 03/13/2018  PT End of Session - 03/13/18 1555    Visit Number  5    Number of Visits  13    Date for PT Re-Evaluation  03/07/18    Authorization Type  5/10    PT Start Time  1602    PT Stop Time  1642    PT Time Calculation (min)  40 min    Equipment Utilized During Treatment  Gait belt    Activity Tolerance  Patient tolerated treatment well    Behavior During Therapy  WFL for tasks assessed/performed       Past Medical History:  Diagnosis Date  . Anemia    WAS ANEMIC  . Arthritis   . Blood transfusion    HAD 1 SEVERAL YRS AGO...3-4 YR  . Chronic kidney disease    SOME DECREASE IN KIDNEY FUNCTION  . Diabetes mellitus without complication ([place])   . Hypertension   . Normal cardiac stress test    REQUESTING COPY FROM [facility]  . Normal echocardiogram    REQUESTING THAT RESULT -- DR [name]   . PONV (postoperative nausea and vomiting)   . Shortness of breath   . Spondylolysis 1998    Past Surgical History:  Procedure Laterality Date  . ABDOMINAL HYSTERECTOMY    . BACK SURGERY     2 CERVICAL SURGERIES ALSO  . CATARACTS     RIGHT EYE CATARACT  . CERVICAL DISCECTOMY  08/1995 and 2011   C4-,C5-6, C6-7 anterior cervical; Dr. Ellene Route fusion and plating  . LAMINECTOMY     decompressive; Dr. Burman Riis in [place] MICRODISCECTOMY  04/14/2011   Procedure: LUMBAR LAMINECTOMY/DECOMPRESSION MICRODISCECTOMY;  Surgeon: [name];  Location: [place] [facility];  Service: Neurosurgery;  Laterality: N/A;  Lumbar Three and Lumbar Four Laminectomy  . TONSILLECTOMY AND ADENOIDECTOMY  1939     There were no vitals filed for this visit.  Subjective Assessment - 03/13/18 1555    Subjective  Pt reports that she is doing alright. She had L ankle sprain 2 weeks ago but it is feeling better. When asked about whether she would like to continue with therapy she states "yes and no." She reports that she has not been performing her HEP recently and is unsure if she could be consistent with her home program. Pt reports that her sister passed away yesterday so it has been hard to be interested in her exercises over the last couple weeks.     Pertinent History  Patient reports that she had lumbar spine surgery in February of last year and has had difficulty walking since. Pt stated that she is only able to ambulate with an upright walker but that her main form of mobility is an Transport planner. Pt reports that the most recent time she has walked was one day last week for only a short period of time. Pt fell from scooter onto floor apporimxately 6 months ago and landed on her R knee. In regards to shoulder pain, pt begain experiencing bilateral shoulder pain approximately 2 months ago with insidious onset. Pt states that she is having difficulty reaching into cabinents, rolling  her hair and pushing up to reposition in scooter. Current pain is 2/10 and the worst pain in the past week was 6/10 when attempting to get out of her bed. In general, pt states that it "hurts to move". Pt enjoys directing choirs, going to [employer] activities, and watching TV. Pt also states that she is very interested in aquatic threapy.      Limitations  Walking;Standing;House hold activities;Lifting    How long can you sit comfortably?  10 mins    How long can you stand comfortably?  no long    How long can you walk comfortably?  8 mins    Patient Stated Goals  decrease shoulder pain, walk     Currently in Pain?  No/denies         [facility] PT Assessment - 03/13/18 1615      Standardized Balance Assessment    Standardized Balance Assessment  Berg Balance Test      Berg Balance Test   Sit to Stand  Needs minimal aid to stand or to stabilize    Standing Unsupported  Unable to stand 30 seconds unassisted    Sitting with Back Unsupported but Feet Supported on Floor or Stool  Able to sit safely and securely 2 minutes    Stand to Sit  Sits independently, has uncontrolled descent    Transfers  Needs one person to assist    Standing Unsupported with Eyes Closed  Able to stand 3 seconds    Standing Ubsupported with Feet Together  Needs help to attain position but able to stand for 30 seconds with feet together    From Standing, Reach Forward with Outstretched Arm  Loses balance while trying/requires external support    From Standing Position, Pick up Object from Floor  Unable to try/needs assist to keep balance    From Standing Position, Turn to Look Behind Over each Shoulder  Needs assist to keep from losing balance and falling    Turn 360 Degrees  Needs assistance while turning    Standing Unsupported, Alternately Place Feet on Step/Stool  Needs assistance to keep from falling or unable to try    Standing Unsupported, One Foot in [employer] balance while stepping or standing    Standing on One Leg  Unable to try or needs assist to prevent fall    Total Score  10          TREATMENT:  Ther-ex  Performed outcome measures with pt including 27mgait speed and BERG balance test (results above); Updated goals with patient; Seated marches 2 x 10 with 2# ankle weights; Seated LAQ 2 x 10 with 2# ankle weights; Seated clams with red tband resistance 2 x 10; Seated rows with red tband 2 x 10; Pt issued written HEP with all of her exercises;   Pt educated throughout session about proper posture and technique with exercises. Improved exercise technique, movement at target joints, use of target muscles after min to mod verbal, visual, tactile cues.    Pt's goals are essentially unchanged since her  initial evaluation. Her BERG balance test is actually worse and decreased from a 19/56 at initial evaluation to 10/56 today. She was only able to come for 5 appointments and has not been performing her HEP. Offerred pt to extend her therapy but she elects to take a break. Pt provided with extensive HEP and advised to attempt home program and if she is able to be consistent with her exercises and  wants to return she can contact her PCP for another order. Pt is in agreement. Pt is being discharged on this date due to pt preference and inability to be consistent with therapy appointments or home exercise program.                  PT Education - 03/14/18 1032    Education provided  Yes    Education Details  HEP, discharge, indications for returning to therapy    Person(s) Educated  Patient    Methods  Explanation    Comprehension  Verbalized understanding          PT Long Term Goals - 03/13/18 1607      PT LONG TERM GOAL #1   Title  Pt will be independent and compliant with HEP in order to maintain gains made in physical therapy and return to prior level of function.    Baseline  01/23/18: dependent with form and technique, max verbal and tactile cueing required    Time  4    Period  Weeks    Status  Not Met      PT LONG TERM GOAL #2   Title  Pt will reports worst pain in past week less than 4/10 in order to improve functional mobility and return to prior level of function.    Baseline  01/23/18: 6/10; 03/13/18: worst: 6-7/10    Time  6    Period  Weeks    Status  Not Met      PT LONG TERM GOAL #3   Title  Pt will report no increase in pain while rolling her hair in order to return to prior level of function and indpendence with ADLs.     Baseline  01/23/18: pt reports increased pain while rolling her hair; 03/13/18: continues to have pain    Time  6    Period  Weeks    Status  Not Met      PT LONG TERM GOAL #4   Title  Pt will increase self-selected 10MWT speed by at  least 0.13 m/s in order to demonstrate clinically significant improvement in community ambulation.      Baseline  01/26/18: self-selected: 28.5s=0.35 m/s , fastest: 17.3s = 0.58 m/s; 03/13/18: 26.3s self-selected = 0.38 m/s , fastest: 15.5s = 0.65 m/s     Time  6    Period  Weeks    Status  Partially Met      PT LONG TERM GOAL #5   Title  Pt will improve BERG by at least 3 points in order to demonstrate clinically significant improvement in balance.     Baseline  01/26/18: 19/56; 03/13/18: 10/56    Time  6    Period  Weeks    Status  Not Met            Plan - 03/13/18 1601    Clinical Impression Statement  Pt's goals are essentially unchanged since her initial evaluation. Her BERG balance test is actually worse and decreased from a 19/56 at initial evaluation to 10/56 today. She was only able to come for 5 appointments and has not been performing her HEP. Offerred pt to extend her therapy but she elects to take a break. Pt provided with extensive HEP and advised to attempt home program and if she is able to be consistent with her exercises and wants to return she can contact her PCP for another order. Pt is in agreement. Pt is being discharged  on this date due to pt preference and inability to be consistent with therapy appointments or home exercise program.     Rehab Potential  Fair    Clinical Impairments Affecting Rehab Potential  (+) motivated (-) age, comorbidities, decr activity level    PT Frequency  2x / week    PT Duration  6 weeks    PT Treatment/Interventions  ADLs/Self Care Home Management;Aquatic Therapy;Biofeedback;Cryotherapy;Electrical Stimulation;Moist Heat;Ultrasound;DME Instruction;Therapeutic activities;Functional mobility training;Stair training;Gait training;Therapeutic exercise;Balance training;Neuromuscular re-education;Patient/family education;Manual techniques;Passive range of motion;Dry needling    PT Next Visit Plan  Progress LE strength/balance,  Shoulder/periscapular strengthening/stretching as appropriate based on pain    PT Home Exercise Plan  Pulleys (flexion, abd), scapular retractions    Consulted and Agree with Plan of Care  Patient;Family member/caregiver    Family Member Consulted  Caregiver       Patient will benefit from skilled therapeutic intervention in order to improve the following deficits and impairments:  Abnormal gait, Decreased balance, Decreased endurance, Decreased mobility, Difficulty walking, Hypomobility, Increased muscle spasms, Impaired sensation, Improper body mechanics, Decreased range of motion, Decreased activity tolerance, Decreased strength, Decreased safety awareness, Impaired UE functional use, Postural dysfunction, Pain  Visit Diagnosis: Chronic left shoulder pain  Chronic right shoulder pain  Unsteadiness on feet     Problem List Patient Active Problem List   Diagnosis Date Noted  . Chronic kidney disease, stage 3, mod decreased GFR (HCC) 04/15/2016  . Ds DNA antibody positive 04/15/2016  . Radicular pain of both lower extremities 04/13/2016  . Spine pain, lumbar 04/13/2016  . ANA positive 04/06/2016  . Right upper quadrant pain 09/11/2015  . Neuropathic pain 04/15/2015  . Cervical pain 01/24/2015  . Depression 01/01/2015  . Back pain, chronic 12/14/2014  . COPD, mild ([place]) 12/14/2014  . Anxiety, generalized 12/14/2014  . Esophageal reflux 12/14/2014  . Diabetes ([place]) 12/14/2014  . Progeria syndrome 12/14/2014  . BP (high blood pressure) 12/14/2014  . Adaptive colitis 12/14/2014  . Cannot sleep 12/14/2014  . Affective disorder, major 12/14/2014  . Chronic anemia 12/14/2014  . Arthritis, degenerative 12/14/2014  . OP (osteoporosis) 12/14/2014  . Restless leg 12/14/2014  . Neuralgia neuritis, sciatic nerve 12/14/2014  . 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 Lumbar canal stenosis 02/10/2012  . DDD (degenerative disc disease), lumbosacral 11/19/2011   . Neuropathy ([place]) 02/26/2008  . Essential hypertension 02/26/2008  . Fatigue 02/26/2008  . DYSPNEA 02/26/2008   Phillips Grout PT, DPT, GCS  Hughes Wyndham 03/14/2018, 10:36 AM  [place] MAIN [facility] SERVICES [address] [place], [place], [address] Phone: [phone]   Fax:  [phone]  Name: WILLYE JAVIER MRN: [id number] Date of Birth: 1926-01-22

## 2018-03-13 NOTE — Patient Instructions (Signed)
Access Code: [email or web address]  URL: [email or web address]  Date: 03/13/2018  Prepared by: Ria Comment   Exercises  Seated March - 10 reps - 2 sets - 3 seconds hold - 1x daily - 7x weekly  Seated Long Arc Quad - 10 reps - 2 sets - seconds hold - 1x daily - 7x weekly  Seated Hip Abduction - 10 reps - 2 sets - 3 seconds hold - 1x daily - 7x weekly  Seated Shoulder Row with Anchored Resistance - 10 reps - 2 sets - 3 seconds hold - 1x daily - 7x weekly  Seated Shoulder Flexion AAROM with Pulley Behind - 10 reps - 2 sets - 3 seconds hold - 1x daily - 7x weekly  Seated Shoulder Scaption AAROM with Pulley at Side - 10 reps - 2 sets - 3 seconds hold - 1x daily - 7x weekly

## 2018-03-15 ENCOUNTER — Ambulatory Visit: Payer: Medicare Other

## 2018-03-16 ENCOUNTER — Ambulatory Visit: Payer: Medicare Other

## 2018-03-20 ENCOUNTER — Ambulatory Visit: Payer: Medicare Other

## 2018-03-22 ENCOUNTER — Ambulatory Visit: Payer: Medicare Other

## 2018-03-27 ENCOUNTER — Ambulatory Visit: Payer: Medicare Other

## 2018-03-29 ENCOUNTER — Ambulatory Visit: Payer: Medicare Other

## 2018-03-30 ENCOUNTER — Ambulatory Visit: Payer: Medicare Other

## 2018-04-04 ENCOUNTER — Ambulatory Visit: Payer: Medicare Other

## 2018-04-06 ENCOUNTER — Ambulatory Visit: Payer: Medicare Other

## 2018-04-11 ENCOUNTER — Ambulatory Visit: Payer: Medicare Other

## 2018-04-13 ENCOUNTER — Ambulatory Visit: Payer: Medicare Other

## 2018-04-18 ENCOUNTER — Ambulatory Visit: Payer: Medicare Other

## 2018-04-18 ENCOUNTER — Other Ambulatory Visit: Payer: Self-pay | Admitting: Family Medicine

## 2018-04-20 ENCOUNTER — Ambulatory Visit: Payer: Medicare Other

## 2018-05-01 ENCOUNTER — Ambulatory Visit (INDEPENDENT_AMBULATORY_CARE_PROVIDER_SITE_OTHER): Payer: Medicare Other | Admitting: Family Medicine

## 2018-05-01 ENCOUNTER — Encounter: Payer: Self-pay | Admitting: Family Medicine

## 2018-05-01 VITALS — BP 122/60 | HR 84 | Temp 97.8°F | Resp 16 | Ht 66.0 in | Wt 178.0 lb

## 2018-05-01 DIAGNOSIS — N183 Chronic kidney disease, stage 3 unspecified: Secondary | ICD-10-CM

## 2018-05-01 DIAGNOSIS — I10 Essential (primary) hypertension: Secondary | ICD-10-CM | POA: Diagnosis not present

## 2018-05-01 DIAGNOSIS — K2100 Gastro-esophageal reflux disease with esophagitis, without bleeding: Secondary | ICD-10-CM

## 2018-05-01 DIAGNOSIS — E118 Type 2 diabetes mellitus with unspecified complications: Secondary | ICD-10-CM | POA: Diagnosis not present

## 2018-05-01 DIAGNOSIS — Z23 Encounter for immunization: Secondary | ICD-10-CM

## 2018-05-01 DIAGNOSIS — M5137 Other intervertebral disc degeneration, lumbosacral region: Secondary | ICD-10-CM

## 2018-05-01 DIAGNOSIS — R079 Chest pain, unspecified: Secondary | ICD-10-CM

## 2018-05-01 LAB — POCT GLYCOSYLATED HEMOGLOBIN (HGB A1C): Hemoglobin A1C: 7.6 % — AB (ref 4.0–5.6)

## 2018-05-01 NOTE — Progress Notes (Signed)
Patient: Renee Carpenter Female    DOB: [birth year]   84 y.o.   MRN: [id number] Visit Date: 05/01/2018  Today's Provider: Megan Mans, MD   Chief Complaint  Patient presents with  . Diabetes   Subjective:    HPI  Diabetes Mellitus Type II, Follow-up:   Lab Results  Component Value Date   HGBA1C 7.6 (A) 05/01/2018   HGBA1C 7.5 (A) 12/26/2017   HGBA1C 7.8 (H) 08/26/2017    Last seen for diabetes 4 months ago.  Management since then includes no changes. She reports good compliance with treatment. She is not having side effects.  Current symptoms include none and have been stable. Home blood sugar records: trend: fluctuating a bit  Episodes of hypoglycemia? no   Current Insulin Regimen: none Most Recent Eye Exam: up to date Weight trend: stable Prior visit with dietician: no Current diet: well balanced Current exercise: no regular exercise  Several issues she wishes to discuss today.  Wakes up with shortness of breath in the middle the night.  Other symptoms.  He definitely associates vivid dreams with this.  Has no shortness of breath when she initially goes to lay down at night.  No other cardiac symptoms at all.  He also complains of chronic left foot drop.  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  Has a special brace on which she is able to stand up some.  She does not really ambulate. Pertinent Labs:    Component Value Date/Time   CHOL 149 08/26/2017 1003   TRIG 141 08/26/2017 1003   HDL 47 08/26/2017 1003   LDLCALC 74 08/26/2017 1003   CREATININE 1.24 (H) 08/26/2017 1003   CREATININE 1.12 02/06/2014 1958    Wt Readings from Last 3 Encounters:  05/01/18 178 lb (80.7 kg)  02/27/18 178 lb (80.7 kg)  12/26/17 177 lb (80.3 kg)      Allergies  Allergen Reactions  . Acetaminophen Itching and Other (See Comments)    Stomach upset  . Aspirin Itching and Other (See Comments)    Stomach pain  . Metformin And  Related     nausea  . Other Other (See Comments)    Novocaine, strange feeling  . Sulfa Antibiotics Other (See Comments)  . Tizanidine     Diarrhea, almost passed out, felt dizzy and confused  . Caffeine Palpitations  . Ginger Palpitations  . Ibuprofen Rash     Current Outpatient Medications:  .  acetaminophen (TYLENOL) 500 MG tablet, Take 500 mg by mouth every 6 (six) hours as needed., Disp: , Rfl:  .  aspirin EC 81 MG tablet, Take 81 mg by mouth daily. , Disp: , Rfl:  .  Calcium-Magnesium-Vitamin D (CALCIUM 500 PO), Take by mouth daily. , Disp: , Rfl:  .  calcium-vitamin D (OSCAL WITH D) 500-200 MG-UNIT tablet, Take 1 tablet by mouth 2 (two) times daily., Disp: , Rfl:  .  Coenzyme Q10 (CO Q10) 200 MG CAPS, Take by mouth daily. , Disp: , Rfl:  .  DULoxetine (CYMBALTA) 20 MG capsule, Take 1 capsule (20 mg total) by mouth daily. (Patient taking differently: Take 20 mg by mouth 2 (two) times daily. ), Disp: 30 capsule, Rfl: 3 .  esomeprazole (NEXIUM) 40 MG capsule, TAKE 1 CAPSULE BY MOUTH EVERY DAY, Disp: 90 capsule, Rfl: 5 .  fish oil-omega-3 fatty acids 1000 MG capsule, Take 1 g by mouth 2 (two) times daily.  ,  Disp: , Rfl:  .  gabapentin (NEURONTIN) 600 MG tablet, Take 1 tablet (600 mg total) by mouth 3 (three) times daily., Disp: 270 tablet, Rfl: 3 .  glimepiride (AMARYL) 4 MG tablet, Take 1 tablet (4 mg total) by mouth daily before breakfast., Disp: 30 tablet, Rfl: 12 .  glucose blood (ACCU-CHEK AVIVA PLUS) test strip, Check blood sugar once daily, fasting Use as instructed, Disp: 100 each, Rfl: 12 .  hydroxypropyl methylcellulose (ISOPTO TEARS) 2.5 % ophthalmic solution, Place 1 drop into both eyes 3 (three) times daily as needed. Dry eyes , Disp: , Rfl:  .  Lancets (ACCU-CHEK SOFT TOUCH) lancets, Use as instructed, Disp: 100 each, Rfl: 12 .  losartan (COZAAR) 25 MG tablet, TAKE 1 TABLET BY MOUTH EVERY DAY, Disp: 90 tablet, Rfl: 3 .  MAGNESIUM-OXIDE 400 (241.3 Mg) MG tablet, TAKE 1  TABLET BY MOUTH TWICE DAILY, Disp: 60 tablet, Rfl: 11 .  MULTIPLE VITAMIN PO, Take by mouth., Disp: , Rfl:  .  oxyCODONE (OXY IR/ROXICODONE) 5 MG immediate release tablet, TK SS TO ONE T PO Q 6 H PRN P, Disp: , Rfl: 0 .  ranitidine (ZANTAC) 150 MG tablet, TAKE 1 TABLET(150 MG) BY MOUTH TWICE DAILY, Disp: 60 tablet, Rfl: 11 .  rOPINIRole (REQUIP) 1 MG tablet, Take 1 mg by mouth. 1/2 tablet four times daily., Disp: , Rfl:  .  senna-docusate (SENOKOT-S) 8.6-50 MG tablet, Take 1 tablet by mouth daily as needed., Disp: , Rfl:  .  tamsulosin (FLOMAX) 0.4 MG CAPS capsule, TAKE 1 CAPSULE(0.4 MG) BY MOUTH DAILY, Disp: 90 capsule, Rfl: 3 .  urea (CARMOL) 40 % CREA, Apply topically., Disp: , Rfl:  .  conjugated estrogens (PREMARIN) vaginal cream, Place 1 Applicatorful vaginally daily. (Patient not taking: Reported on 08/24/2017), Disp: 42.5 g, Rfl: 12 .  estrogens, conjugated, (PREMARIN) 0.3 MG tablet, TAKE 1 TABLET BY MOUTH EVERY DAY FOR 21 DAYS THEN DO NOT TAKE FOR 7 DAYS (Patient not taking: Reported on 08/24/2017), Disp: 30 tablet, Rfl: 11 .  pregabalin (LYRICA) 25 MG capsule, Take 1 capsule (25 mg total) by mouth daily. (Patient not taking: Reported on 12/26/2017), Disp: 30 capsule, Rfl: 5  Review of Systems  Constitutional: Negative.   Eyes: Negative.   Respiratory: Negative for cough and shortness of breath.   Cardiovascular: Negative for chest pain, palpitations and leg swelling.  Endocrine: Negative for cold intolerance, heat intolerance, polydipsia, polyphagia and polyuria.  Musculoskeletal: Positive for arthralgias and myalgias.  Skin: Negative.   Allergic/Immunologic: Negative.   Neurological: Positive for weakness. Negative for dizziness, light-headedness and headaches.  Hematological: Does not bruise/bleed easily.  Psychiatric/Behavioral: Negative for agitation, decreased concentration, self-injury, sleep disturbance and suicidal ideas. The patient is not nervous/anxious.     Social History    Tobacco Use  . Smoking status: Never Smoker  . Smokeless tobacco: Never Used  Substance Use Topics  . Alcohol use: No   Objective:   BP 122/60 (BP Location: Right Arm, Patient Position: Sitting, Cuff Size: Normal)   Pulse 84   Temp 97.8 F (36.6 C)   Resp 16   Ht 5\' 6"  (1.676 m)   Wt 178 lb (80.7 kg)   SpO2 97%   BMI 28.73 kg/m  Vitals:   05/01/18 1331  BP: 122/60  Pulse: 84  Resp: 16  Temp: 97.8 F (36.6 C)  SpO2: 97%  Weight: 178 lb (80.7 kg)  Height: 5\' 6"  (1.676 m)     Physical Exam  Constitutional: She  is oriented to person, place, and time. She appears well-developed and well-nourished.  Nourished female in a wheelchair.  She appears younger than her age of 84.  HENT:  Head: Normocephalic and atraumatic.  Right Ear: External ear normal.  Left Ear: External ear normal.  Nose: Nose normal.  Eyes: Conjunctivae are normal. No scleral icterus.  Neck: No thyromegaly present.  Cardiovascular: Normal rate, regular rhythm and normal heart sounds.  Pulmonary/Chest: Effort normal and breath sounds normal.  Abdominal: Soft.  Musculoskeletal:  Has increased thoracic kyphosis.  Right foot does want to invert very easily.  Neurological: She is alert and oriented to person, place, and time.  He is in a wheelchair, has been for several years now.  Skin: Skin is warm and dry.  Fair skin with no obvious lesions  Psychiatric: She has a normal mood and affect. Her behavior is normal. Judgment and thought content normal.        Assessment & Plan:     1. Type 2 diabetes mellitus with complication, without long-term current use of insulin (HCC) Stable.  No changes today.  She has no hypoglycemia - POCT glycosylated hemoglobin (Hb A1C)--7.6  2. Flu vaccine need  - Flu vaccine HIGH DOSE PF (Fluzone High dose)  3. Chest pain, unspecified type She actually is describing paroxysmal nocturnal dyspnea but I think she is simply having vivid dreams and awakens scared.  ECG is  unremarkable / unchanged.  Offered cardiology referral at any time but I do not think this is necessary at this time.  She is otherwise comfortable and asymptomatic. - EKG 12-Lead  4. Essential hypertension   5. Gastroesophageal reflux disease with esophagitis   6. DDD (degenerative disc disease), lumbosacral She has had multiple back and neck surgeries.  7. Chronic kidney disease, stage 3, mod decreased GFR (HCC) Lab work.  Also has a history of anemia       I have done the exam and reviewed the above chart and it is accurate to the best of my knowledge. [employer]  technology has been used in this note in any air is in the dictation or transcription are unintentional.  Megan Mansichard  Jr, MD  [facility]

## 2018-06-08 ENCOUNTER — Encounter: Payer: Self-pay | Admitting: Family Medicine

## 2018-06-08 ENCOUNTER — Ambulatory Visit (INDEPENDENT_AMBULATORY_CARE_PROVIDER_SITE_OTHER): Payer: Medicare Other | Admitting: Family Medicine

## 2018-06-08 VITALS — BP 118/58 | HR 84 | Temp 98.2°F

## 2018-06-08 DIAGNOSIS — N183 Chronic kidney disease, stage 3 unspecified: Secondary | ICD-10-CM

## 2018-06-08 DIAGNOSIS — I10 Essential (primary) hypertension: Secondary | ICD-10-CM | POA: Diagnosis not present

## 2018-06-08 DIAGNOSIS — M5416 Radiculopathy, lumbar region: Secondary | ICD-10-CM

## 2018-06-08 DIAGNOSIS — M150 Primary generalized (osteo)arthritis: Secondary | ICD-10-CM

## 2018-06-08 DIAGNOSIS — M48062 Spinal stenosis, lumbar region with neurogenic claudication: Secondary | ICD-10-CM

## 2018-06-08 DIAGNOSIS — M159 Polyosteoarthritis, unspecified: Secondary | ICD-10-CM

## 2018-06-08 NOTE — Progress Notes (Signed)
Patient: Renee Carpenter Female    DOB: 1925/08/13   85 y.o.   MRN: [id number] Visit Date: 06/08/2018  Today's Provider: Megan Mans, MD   Chief Complaint  Patient presents with  . Groin Pain   Subjective:     Back Pain  This is a new problem. Episode onset: approximately 1 month ago. The problem occurs intermittently. The problem is unchanged. The pain is present in the lumbar spine. The quality of the pain is described as aching. Radiates to: groin area and left leg. The symptoms are aggravated by sitting and position. Stiffness is present all day. Associated symptoms include leg pain and weakness. Risk factors: neuropathy  She has tried heat (Tylenol and prescription medications for neuropathy) for the symptoms. The treatment provided mild relief.    Allergies  Allergen Reactions  . Acetaminophen Itching and Other (See Comments)    Stomach upset  . Aspirin Itching and Other (See Comments)    Stomach pain  . Metformin And Related     nausea  . Other Other (See Comments)    Novocaine, strange feeling  . Sulfa Antibiotics Other (See Comments)  . Tizanidine     Diarrhea, almost passed out, felt dizzy and confused  . Caffeine Palpitations  . Ginger Palpitations  . Ibuprofen Rash     Current Outpatient Medications:  .  acetaminophen (TYLENOL) 500 MG tablet, Take 500 mg by mouth every 6 (six) hours as needed., Disp: , Rfl:  .  aspirin EC 81 MG tablet, Take 81 mg by mouth daily. , Disp: , Rfl:  .  Coenzyme Q10 (CO Q10) 200 MG CAPS, Take by mouth daily. , Disp: , Rfl:  .  DULoxetine (CYMBALTA) 20 MG capsule, Take 1 capsule (20 mg total) by mouth daily. (Patient taking differently: Take 20 mg by mouth 2 (two) times daily. ), Disp: 30 capsule, Rfl: 3 .  esomeprazole (NEXIUM) 40 MG capsule, TAKE 1 CAPSULE BY MOUTH EVERY DAY, Disp: 90 capsule, Rfl: 5 .  gabapentin (NEURONTIN) 600 MG tablet, Take 1 tablet (600 mg total) by mouth 3 (three) times daily., Disp: 270  tablet, Rfl: 3 .  glimepiride (AMARYL) 4 MG tablet, Take 1 tablet (4 mg total) by mouth daily before breakfast., Disp: 30 tablet, Rfl: 12 .  glucose blood (ACCU-CHEK AVIVA PLUS) test strip, Check blood sugar once daily, fasting Use as instructed, Disp: 100 each, Rfl: 12 .  hydroxypropyl methylcellulose (ISOPTO TEARS) 2.5 % ophthalmic solution, Place 1 drop into both eyes 3 (three) times daily as needed. Dry eyes , Disp: , Rfl:  .  Lancets (ACCU-CHEK SOFT TOUCH) lancets, Use as instructed, Disp: 100 each, Rfl: 12 .  losartan (COZAAR) 25 MG tablet, TAKE 1 TABLET BY MOUTH EVERY DAY, Disp: 90 tablet, Rfl: 3 .  MAGNESIUM-OXIDE 400 (241.3 Mg) MG tablet, TAKE 1 TABLET BY MOUTH TWICE DAILY, Disp: 60 tablet, Rfl: 11 .  MULTIPLE VITAMIN PO, Take by mouth., Disp: , Rfl:  .  pregabalin (LYRICA) 25 MG capsule, Take 1 capsule (25 mg total) by mouth daily., Disp: 30 capsule, Rfl: 5 .  ranitidine (ZANTAC) 150 MG tablet, TAKE 1 TABLET(150 MG) BY MOUTH TWICE DAILY, Disp: 60 tablet, Rfl: 11 .  rOPINIRole (REQUIP) 1 MG tablet, Take 1 mg by mouth. 1/2 tablet four times daily., Disp: , Rfl:  .  senna-docusate (SENOKOT-S) 8.6-50 MG tablet, Take 1 tablet by mouth daily as needed., Disp: , Rfl:  .  tamsulosin (FLOMAX) 0.4  MG CAPS capsule, TAKE 1 CAPSULE(0.4 MG) BY MOUTH DAILY, Disp: 90 capsule, Rfl: 3  Review of Systems  Constitutional: Negative.   HENT: Negative.   Eyes: Negative.   Respiratory: Negative.   Cardiovascular: Negative.   Gastrointestinal: Negative.   Endocrine: Negative.   Genitourinary:       Groin pain   Musculoskeletal: Positive for back pain.       Left leg pain   Neurological: Positive for weakness.  Psychiatric/Behavioral: Negative.     Social History   Tobacco Use  . Smoking status: Never Smoker  . Smokeless tobacco: Never Used  Substance Use Topics  . Alcohol use: No      Objective:   BP (!) 118/58 (BP Location: Right Arm, Patient Position: Sitting, Cuff Size: Normal)   Pulse  84   Temp 98.2 F (36.8 C) (Oral)  Vitals:   06/08/18 1021  BP: (!) 118/58  Pulse: 84  Temp: 98.2 F (36.8 C)  TempSrc: Oral     Physical Exam Constitutional:      Appearance: Normal appearance. She is well-developed.     Comments: He appears younger than her stated age of 38 years.  HENT:     Head: Normocephalic and atraumatic.  Eyes:     General: No scleral icterus.    Conjunctiva/sclera: Conjunctivae normal.  Neck:     Thyroid: No thyromegaly.  Cardiovascular:     Rate and Rhythm: Normal rate and regular rhythm.     Heart sounds: Normal heart sounds.  Pulmonary:     Effort: Pulmonary effort is normal.     Breath sounds: Normal breath sounds.  Abdominal:     Palpations: Abdomen is soft.  Skin:    General: Skin is warm and dry.  Neurological:     Mental Status: She is alert and oriented to person, place, and time.     Motor: Weakness present.     Comments: She is in a wheelchair but is able to move her legs.  She has 2+ or 5 strength with dorsiflexion of the left foot.  She has 2+/5 strength in flexion of her hip on the left  Psychiatric:        Behavior: Behavior normal.        Thought Content: Thought content normal.        Judgment: Judgment normal.         Assessment & Plan    1. Spinal stenosis of lumbar region with neurogenic claudication Due to weakness will refer back to physical therapy at [facility] at this time.  May need referral back to her neurosurgeon.  He is comfortable with this and thinks this is a good plan. - Ambulatory referral to Physical Therapy  2. Radiculopathy, lumbar region  - Ambulatory referral to Physical Therapy  3. Essential hypertension   4. Chronic kidney disease, stage 3, mod decreased GFR (HCC) Follow with routine lab work.  5. Primary osteoarthritis involving multiple joints      Megan Mans, MD  [facility]

## 2018-06-20 ENCOUNTER — Other Ambulatory Visit: Payer: Self-pay

## 2018-07-18 ENCOUNTER — Encounter: Payer: Self-pay | Admitting: Family Medicine

## 2018-07-18 ENCOUNTER — Ambulatory Visit (INDEPENDENT_AMBULATORY_CARE_PROVIDER_SITE_OTHER): Payer: Medicare Other | Admitting: Family Medicine

## 2018-07-18 VITALS — BP 126/70 | HR 100 | Temp 98.4°F | Resp 20 | Wt 184.0 lb

## 2018-07-18 DIAGNOSIS — M150 Primary generalized (osteo)arthritis: Secondary | ICD-10-CM | POA: Diagnosis not present

## 2018-07-18 DIAGNOSIS — E118 Type 2 diabetes mellitus with unspecified complications: Secondary | ICD-10-CM | POA: Diagnosis not present

## 2018-07-18 DIAGNOSIS — M25552 Pain in left hip: Secondary | ICD-10-CM

## 2018-07-18 DIAGNOSIS — E1142 Type 2 diabetes mellitus with diabetic polyneuropathy: Secondary | ICD-10-CM

## 2018-07-18 DIAGNOSIS — M48062 Spinal stenosis, lumbar region with neurogenic claudication: Secondary | ICD-10-CM

## 2018-07-18 DIAGNOSIS — W19XXXA Unspecified fall, initial encounter: Secondary | ICD-10-CM | POA: Diagnosis not present

## 2018-07-18 DIAGNOSIS — G20 Parkinson's disease: Secondary | ICD-10-CM

## 2018-07-18 DIAGNOSIS — M159 Polyosteoarthritis, unspecified: Secondary | ICD-10-CM

## 2018-07-18 MED ORDER — BLOOD GLUCOSE MONITOR KIT: PACK | 0 refills | Status: DC

## 2018-07-18 NOTE — Progress Notes (Signed)
Patient: Renee Carpenter Female    DOB: [birth year]   85 y.o.   MRN: [id number] Visit Date: 07/18/2018  Today's Provider: [name], MD   Chief Complaint  Patient presents with  . wanting to discuss medication   Subjective:     HPI  Patient comes in today wanting to discuss one of her medication. She feels like the carbidopa (what she takes for restless legs) is giving her side effects. She reports that she is having dry mouth and headaches.  Patient has multiple complaints today.  She complains of trouble swallowing her pills and wishes to have speech therapy to help her with this.  I think this is reasonable. He has had a recent fall where she fell on her left hip.  She has known pain with weightbearing but some pain in the area of the left greater trochanter trochanter.  The fall she is also had left greater than right shoulder discomfort.  It is all with movement of the shoulder. She states that she needs a new glucometer and strips for her blood sugar which is slowly getting higher.  Has not checked her sugar in some time.  Allergies  Allergen Reactions  . Acetaminophen Itching and Other (See Comments)    Stomach upset  . Aspirin Itching and Other (See Comments)    Stomach pain  . Metformin And Related     nausea  . Other Other (See Comments)    Novocaine, strange feeling  . Sulfa Antibiotics Other (See Comments)  . Tizanidine     Diarrhea, almost passed out, felt dizzy and confused  . Caffeine Palpitations  . Ginger Palpitations  . Ibuprofen Rash     Current Outpatient Medications:  .  acetaminophen (TYLENOL) 500 MG tablet, Take 500 mg by mouth every 6 (six) hours as needed., Disp: , Rfl:  .  aspirin EC 81 MG tablet, Take 81 mg by mouth daily. , Disp: , Rfl:  .  Coenzyme Q10 (CO Q10) 200 MG CAPS, Take by mouth daily. , Disp: , Rfl:  .  DULoxetine (CYMBALTA) 20 MG capsule, Take 1 capsule (20 mg total) by mouth daily., Disp: 30 capsule, Rfl: 3 .   esomeprazole (NEXIUM) 40 MG capsule, TAKE 1 CAPSULE BY MOUTH EVERY DAY, Disp: 90 capsule, Rfl: 5 .  gabapentin (NEURONTIN) 600 MG tablet, Take 1 tablet (600 mg total) by mouth 3 (three) times daily., Disp: 270 tablet, Rfl: 3 .  glimepiride (AMARYL) 4 MG tablet, Take 1 tablet (4 mg total) by mouth daily before breakfast., Disp: 30 tablet, Rfl: 12 .  glucose blood (ACCU-CHEK AVIVA PLUS) test strip, Check blood sugar once daily, fasting Use as instructed, Disp: 100 each, Rfl: 12 .  hydroxypropyl methylcellulose (ISOPTO TEARS) 2.5 % ophthalmic solution, Place 1 drop into both eyes 3 (three) times daily as needed. Dry eyes , Disp: , Rfl:  .  Lancets (ACCU-CHEK SOFT TOUCH) lancets, Use as instructed, Disp: 100 each, Rfl: 12 .  losartan (COZAAR) 25 MG tablet, TAKE 1 TABLET BY MOUTH EVERY DAY, Disp: 90 tablet, Rfl: 3 .  MAGNESIUM-OXIDE 400 (241.3 Mg) MG tablet, TAKE 1 TABLET BY MOUTH TWICE DAILY, Disp: 60 tablet, Rfl: 11 .  MULTIPLE VITAMIN PO, Take by mouth., Disp: , Rfl:  .  pregabalin (LYRICA) 25 MG capsule, Take 1 capsule (25 mg total) by mouth daily., Disp: 30 capsule, Rfl: 5 .  rOPINIRole (REQUIP) 1 MG tablet, Take 1 mg by mouth. 1/2 tablet  four times daily., Disp: , Rfl:  .  senna-docusate (SENOKOT-S) 8.6-50 MG tablet, Take 1 tablet by mouth daily as needed., Disp: , Rfl:  .  tamsulosin (FLOMAX) 0.4 MG CAPS capsule, TAKE 1 CAPSULE(0.4 MG) BY MOUTH DAILY, Disp: 90 capsule, Rfl: 3  Review of Systems  Constitutional: Negative for activity change and fatigue.  HENT: Negative.        Dry mouth.  Eyes: Negative.   Respiratory: Negative for cough and shortness of breath.   Cardiovascular: Negative for chest pain, palpitations and leg swelling.  Gastrointestinal: Negative.   Endocrine: Negative.   Musculoskeletal: Positive for arthralgias, gait problem and myalgias.  Skin: Negative.   Allergic/Immunologic: Negative for environmental allergies.  Neurological: Positive for weakness and  light-headedness. Negative for dizziness and headaches.  Psychiatric/Behavioral: Positive for agitation. The patient is nervous/anxious.   All other systems reviewed and are negative.   Social History   Tobacco Use  . Smoking status: Never Smoker  . Smokeless tobacco: Never Used  Substance Use Topics  . Alcohol use: No      Objective:   BP 126/70 (BP Location: Left Arm, Patient Position: Sitting, Cuff Size: Large)   Pulse 100   Temp 98.4 F (36.9 C)   Resp 20   Wt 184 lb (83.5 kg)   SpO2 94%   BMI 29.70 kg/m  Vitals:   07/18/18 1456  BP: 126/70  Pulse: 100  Resp: 20  Temp: 98.4 F (36.9 C)  SpO2: 94%  Weight: 184 lb (83.5 kg)     Physical Exam Vitals signs reviewed.  Constitutional:      Appearance: Normal appearance. She is well-developed.     Comments: She appears younger than her stated age of 89 years.  HENT:     Head: Normocephalic and atraumatic.     Right Ear: External ear normal.     Left Ear: External ear normal.     Nose: Nose normal.     Mouth/Throat:     Pharynx: Oropharynx is clear.  Eyes:     General: No scleral icterus.    Conjunctiva/sclera: Conjunctivae normal.  Neck:     Thyroid: No thyromegaly.  Cardiovascular:     Rate and Rhythm: Normal rate and regular rhythm.     Heart sounds: Normal heart sounds.  Pulmonary:     Effort: Pulmonary effort is normal.     Breath sounds: Normal breath sounds.  Abdominal:     Palpations: Abdomen is soft.  Musculoskeletal:     Comments: Patient in a wheelchair.  She has braces on both feet and ankles.  Skin:    General: Skin is warm and dry.  Neurological:     Mental Status: She is alert and oriented to person, place, and time. Mental status is at baseline.     Motor: Weakness present.     Comments: She is in a wheelchair but is able to move her legs.  She has 2+ or 5 strength with dorsiflexion of the left foot.  She has 2+/5 strength in flexion of her hip on the left  Psychiatric:        Mood  and Affect: Mood normal.        Behavior: Behavior normal.        Thought Content: Thought content normal.        Judgment: Judgment normal.         Assessment & Plan    1. Primary osteoarthritis involving multiple joints X-ray her left hip  today.  She fell recently.  Use only topicals or gabapentin at this time.  2. Type 2 diabetes mellitus with complication, without long-term current use of insulin [facility] 2) Patient written for new glucometer and strips. - blood glucose meter kit and supplies KIT; Dispense based on patient and insurance preference. Use up to four times daily as directed. (FOR ICD-9 250.00, 250.01).  Dispense: 1 each; Refill: 0  3. Pain of left hip joint  - DG Hip Unilat W OR W/O Pelvis 2-3 Views Left; Future  4. Fall, initial encounter   5. Spinal stenosis of lumbar region with neurogenic claudication Physical therapy is offered.  I am not sure how much benefit she will get with her advanced age and her advanced degree of disability  6. Type 2 diabetes mellitus with diabetic polyneuropathy, without long-term current use of insulin (HCC)  7.  Parkinson's disease Think patient's complaints of dry mouth and side effects is from the carbidopa.  We will go from 3 times daily to twice a day and then daily and then stop.  I will see her back in a few weeks.  Has no tremor.  I am not sure this is helping her with any movement.  I think most of her neurologic stability is due to spinal stenosis and neuropathy.     Graves Nipp Cranford Mon, MD  [facility]

## 2018-07-18 NOTE — Patient Instructions (Signed)
Decrease Carbidopa to twice daily for 1 week, then decrease to daily for 1 week, then stop completely.

## 2018-07-19 ENCOUNTER — Ambulatory Visit: Payer: Self-pay | Admitting: Family Medicine

## 2018-07-20 ENCOUNTER — Ambulatory Visit
Admission: RE | Admit: 2018-07-20 | Discharge: 2018-07-20 | Disposition: A | Discharge status: Home / Self Care | Payer: Medicare Other | Source: Ambulatory Visit | Attending: Family Medicine | Admitting: Family Medicine

## 2018-07-20 ENCOUNTER — Ambulatory Visit
Admission: RE | Admit: 2018-07-20 | Discharge: 2018-07-20 | Disposition: A | Discharge status: Home / Self Care | Payer: Medicare Other | Attending: Family Medicine | Admitting: Family Medicine

## 2018-07-20 DIAGNOSIS — M25552 Pain in left hip: Secondary | ICD-10-CM | POA: Insufficient documentation

## 2018-07-24 ENCOUNTER — Telehealth: Payer: Self-pay | Admitting: Family Medicine

## 2018-07-24 DIAGNOSIS — E118 Type 2 diabetes mellitus with unspecified complications: Secondary | ICD-10-CM

## 2018-07-24 NOTE — Telephone Encounter (Signed)
°  Pt needing a blood glucose meter called into:   [id number] [place], [place] - [address] [phone] (Phone) [phone] (Fax)   Pt was told it's not been called in by pharmacy  Thanks, [facility]

## 2018-07-25 MED ORDER — BLOOD GLUCOSE MONITOR KIT: PACK | 0 refills | Status: DC

## 2018-07-25 NOTE — Telephone Encounter (Signed)
Reprinted and faxed to pharmacy

## 2018-07-25 NOTE — Telephone Encounter (Signed)
Faxed Rx for blood glucose meter kit and supplies KIT to [facility]. TNP

## 2018-07-26 ENCOUNTER — Telehealth: Payer: Self-pay | Admitting: *Deleted

## 2018-07-26 DIAGNOSIS — M5416 Radiculopathy, lumbar region: Secondary | ICD-10-CM

## 2018-07-26 DIAGNOSIS — M48062 Spinal stenosis, lumbar region with neurogenic claudication: Secondary | ICD-10-CM

## 2018-07-26 DIAGNOSIS — M5137 Other intervertebral disc degeneration, lumbosacral region: Secondary | ICD-10-CM

## 2018-07-26 NOTE — Telephone Encounter (Signed)
Patient was notified of results. Patient wanted to know what to do concerning the screw noted on x-ray or what the next step is? Please advise?

## 2018-07-26 NOTE — Telephone Encounter (Signed)
She would need to see her neurosurgeon. We can refer her back if she wants.

## 2018-07-27 NOTE — Telephone Encounter (Signed)
Patient was advised. Patient states she would like to be referred back to neurosurgeon. Neurosurgeon was Dr. Wyline Mood in [place].

## 2018-08-28 ENCOUNTER — Other Ambulatory Visit: Payer: Self-pay | Admitting: Family Medicine

## 2018-08-28 DIAGNOSIS — I10 Essential (primary) hypertension: Secondary | ICD-10-CM

## 2018-08-30 ENCOUNTER — Ambulatory Visit: Payer: Self-pay

## 2018-08-30 ENCOUNTER — Ambulatory Visit: Payer: Self-pay | Admitting: Family Medicine

## 2018-09-06 ENCOUNTER — Ambulatory Visit: Payer: Medicare Other

## 2018-09-27 ENCOUNTER — Telehealth: Payer: Self-pay

## 2018-09-27 NOTE — Telephone Encounter (Signed)
Have not seen

## 2018-09-27 NOTE — Telephone Encounter (Signed)
Placed in your stack to review. Thanks!  

## 2018-09-27 NOTE — Telephone Encounter (Signed)
Just signed it

## 2018-09-27 NOTE — Telephone Encounter (Signed)
[facility] called to find out if you received a fax for new wheel chair cushion for the patient.

## 2018-10-11 ENCOUNTER — Telehealth: Payer: Self-pay | Admitting: Family Medicine

## 2018-10-11 NOTE — Telephone Encounter (Signed)
Order written

## 2018-10-11 NOTE — Telephone Encounter (Signed)
Ok to order? Please advise. Thanks!  

## 2018-10-11 NOTE — Telephone Encounter (Signed)
Crystal at [facility] called asking for a lumbar support brace.   They will order it from their medical supply  Please send a written order to   Fax  [phone]  Thanks teri

## 2018-10-11 NOTE — Telephone Encounter (Signed)
ok 

## 2018-11-27 ENCOUNTER — Telehealth: Payer: Self-pay | Admitting: Family Medicine

## 2018-11-27 NOTE — Telephone Encounter (Signed)
Patient reports that this is a chronic issue that seems to be worsening. She reports that she was told she has COPD on an xray she had done in the past. She reports that she always has trouble breathing, but it seems to be getting worse. Patient does not currently have any inhalers. She denies fevers. She reports that shortness of breath is exertional. She denies chest pain. The numbness that she has is due to neuropathy. No new symptoms. She does not have any known exposure to [phone]. She lives at the village of [place], and reports that they have had 0 cases so far.   Patient does not want to come in for an appt. She is wanting to know if we could call in something to help with her breathing? Please advise. Thanks!

## 2018-11-27 NOTE — Telephone Encounter (Signed)
Pt has been  Coughing-wheezing in chest.  Breathing hard that causes weakness. No fever.  Wants to know what Dr. Rosanna Randy thinks she should do for a visit.  She will be available after 3 pm today.  Thanks, [employer]

## 2018-11-28 MED ORDER — ALBUTEROL SULFATE HFA 108 (90 BASE) MCG/ACT IN AERS: 2.0000 | INHALATION_SPRAY | RESPIRATORY_TRACT | 11 refills | Status: DC | PRN

## 2018-11-28 NOTE — Telephone Encounter (Signed)
Medication was sent into the pharmacy.  

## 2018-11-28 NOTE — Telephone Encounter (Signed)
Try albuterol MDI every 4 hours as needed.

## 2019-01-24 ENCOUNTER — Other Ambulatory Visit: Payer: Self-pay

## 2019-01-24 ENCOUNTER — Telehealth: Payer: Self-pay | Admitting: Family Medicine

## 2019-01-24 ENCOUNTER — Ambulatory Visit
Admission: RE | Admit: 2019-01-24 | Discharge: 2019-01-24 | Disposition: A | Discharge status: Home / Self Care | Payer: Medicare Other | Source: Ambulatory Visit | Attending: Family Medicine | Admitting: Family Medicine

## 2019-01-24 ENCOUNTER — Ambulatory Visit (INDEPENDENT_AMBULATORY_CARE_PROVIDER_SITE_OTHER): Payer: Medicare Other | Admitting: Family Medicine

## 2019-01-24 ENCOUNTER — Encounter: Payer: Self-pay | Admitting: Family Medicine

## 2019-01-24 VITALS — BP 122/80 | HR 100 | Temp 98.6°F | Resp 16 | Ht 65.0 in | Wt 180.0 lb

## 2019-01-24 DIAGNOSIS — G8929 Other chronic pain: Secondary | ICD-10-CM

## 2019-01-24 DIAGNOSIS — M48062 Spinal stenosis, lumbar region with neurogenic claudication: Secondary | ICD-10-CM

## 2019-01-24 DIAGNOSIS — M25511 Pain in right shoulder: Secondary | ICD-10-CM | POA: Diagnosis present

## 2019-01-24 DIAGNOSIS — E118 Type 2 diabetes mellitus with unspecified complications: Secondary | ICD-10-CM | POA: Diagnosis not present

## 2019-01-24 DIAGNOSIS — M542 Cervicalgia: Secondary | ICD-10-CM

## 2019-01-24 DIAGNOSIS — M5410 Radiculopathy, site unspecified: Secondary | ICD-10-CM

## 2019-01-24 DIAGNOSIS — D649 Anemia, unspecified: Secondary | ICD-10-CM

## 2019-01-24 DIAGNOSIS — R51 Headache: Secondary | ICD-10-CM

## 2019-01-24 DIAGNOSIS — I10 Essential (primary) hypertension: Secondary | ICD-10-CM

## 2019-01-24 DIAGNOSIS — M25519 Pain in unspecified shoulder: Secondary | ICD-10-CM

## 2019-01-24 DIAGNOSIS — E1142 Type 2 diabetes mellitus with diabetic polyneuropathy: Secondary | ICD-10-CM

## 2019-01-24 MED ORDER — MELOXICAM 7.5 MG PO TABS: 7.5000 mg | ORAL_TABLET | Freq: Every day | ORAL | 1 refills | Status: DC

## 2019-01-24 NOTE — Telephone Encounter (Signed)
Spoke with patient in regards to pain. Has some concern with location of pain. Appointment scheduled for this afternoon.

## 2019-01-24 NOTE — Telephone Encounter (Signed)
pt called and is having some rt side pain in her jaw, neck and possibly her teeth.  She asked for an antibiotic.  Not sure if she needs to see her dentist or come in to see Dr. Rosanna Randy.  Please advise  [phone]  [place]

## 2019-01-24 NOTE — Progress Notes (Signed)
Patient: Renee Carpenter Female    DOB: 1925/08/13   85 y.o.   MRN: [id number] Visit Date: 01/24/2019  Today's Provider: [name], MD   Chief Complaint  Patient presents with  . Neck Pain   Subjective:   HPI Patient comes in today c/o neck pain that has progressively gotten worse over the last month. She reports that she had an abscessed tooth several months ago, and feels that she may have another one.   She reports that the pain is located in her right jaw, and radiates to her right neck and down to her shoulder. She has used heat and tylenol with no relief.  She also c/o chronic right shoulder pain with pain with abducting her right arm. She says her glucoses are fine.BS about 200 fasting. Chronic neuropathy stable.  BP Readings from Last 3 Encounters:  01/24/19 122/80  07/18/18 126/70  06/08/18 (!) 118/58   Wt Readings from Last 3 Encounters:  01/24/19 180 lb (81.6 kg)  07/18/18 184 lb (83.5 kg)  05/01/18 178 lb (80.7 kg)    Allergies  Allergen Reactions  . Acetaminophen Itching and Other (See Comments)    Stomach upset  . Aspirin Itching and Other (See Comments)    Stomach pain  . Metformin And Related     nausea  . Other Other (See Comments)    Novocaine, strange feeling  . Sulfa Antibiotics Other (See Comments)  . Tizanidine     Diarrhea, almost passed out, felt dizzy and confused  . Caffeine Palpitations  . Ginger Palpitations  . Ibuprofen Rash     Current Outpatient Medications:  .  acetaminophen (TYLENOL) 500 MG tablet, Take 500 mg by mouth every 6 (six) hours as needed., Disp: , Rfl:  .  albuterol (VENTOLIN HFA) 108 (90 Base) MCG/ACT inhaler, Inhale 2 puffs into the lungs every 4 (four) hours as needed for wheezing or shortness of breath., Disp: 8 g, Rfl: 11 .  aspirin EC 81 MG tablet, Take 81 mg by mouth daily. , Disp: , Rfl:  .  blood glucose meter kit and supplies KIT, Dispense based on patient and insurance preference. Use up  to four times daily as directed. (FOR ICD-9 250.00, 250.01)., Disp: 1 each, Rfl: 0 .  Coenzyme Q10 (CO Q10) 200 MG CAPS, Take by mouth daily. , Disp: , Rfl:  .  DULoxetine (CYMBALTA) 20 MG capsule, Take 1 capsule (20 mg total) by mouth daily., Disp: 30 capsule, Rfl: 3 .  esomeprazole (NEXIUM) 40 MG capsule, TAKE 1 CAPSULE BY MOUTH EVERY DAY, Disp: 90 capsule, Rfl: 5 .  gabapentin (NEURONTIN) 600 MG tablet, Take 1 tablet (600 mg total) by mouth 3 (three) times daily., Disp: 270 tablet, Rfl: 3 .  glimepiride (AMARYL) 4 MG tablet, TAKE 1 TABLET(4 MG) BY MOUTH DAILY BEFORE BREAKFAST, Disp: 30 tablet, Rfl: 12 .  glucose blood (ACCU-CHEK AVIVA PLUS) test strip, Check blood sugar once daily, fasting Use as instructed, Disp: 100 each, Rfl: 12 .  hydroxypropyl methylcellulose (ISOPTO TEARS) 2.5 % ophthalmic solution, Place 1 drop into both eyes 3 (three) times daily as needed. Dry eyes , Disp: , Rfl:  .  Lancets (ACCU-CHEK SOFT TOUCH) lancets, Use as instructed, Disp: 100 each, Rfl: 12 .  losartan (COZAAR) 25 MG tablet, TAKE 1 TABLET BY MOUTH EVERY DAY, Disp: 90 tablet, Rfl: 3 .  MAGNESIUM-OXIDE 400 (241.3 Mg) MG tablet, TAKE 1 TABLET BY MOUTH TWICE DAILY, Disp: 60 tablet,  Rfl: 11 .  MULTIPLE VITAMIN PO, Take by mouth., Disp: , Rfl:  .  pregabalin (LYRICA) 25 MG capsule, Take 1 capsule (25 mg total) by mouth daily., Disp: 30 capsule, Rfl: 5 .  rOPINIRole (REQUIP) 1 MG tablet, Take 1 mg by mouth. 1/2 tablet four times daily., Disp: , Rfl:  .  senna-docusate (SENOKOT-S) 8.6-50 MG tablet, Take 1 tablet by mouth daily as needed., Disp: , Rfl:  .  tamsulosin (FLOMAX) 0.4 MG CAPS capsule, TAKE 1 CAPSULE(0.4 MG) BY MOUTH DAILY, Disp: 90 capsule, Rfl: 3  Review of Systems  Constitutional: Negative for activity change and fatigue.  HENT: Negative.   Eyes: Negative.   Respiratory: Negative for cough and shortness of breath.   Cardiovascular: Negative for chest pain, palpitations and leg swelling.   Gastrointestinal: Negative.   Musculoskeletal: Positive for arthralgias, myalgias, neck pain and neck stiffness.  Skin: Negative for color change and rash.  Neurological: Positive for weakness. Negative for dizziness, light-headedness and headaches.       Chronic leg weakness.  Psychiatric/Behavioral: Negative for agitation, self-injury, sleep disturbance and suicidal ideas.    Social History   Tobacco Use  . Smoking status: Never Smoker  . Smokeless tobacco: Never Used  Substance Use Topics  . Alcohol use: No      Objective:   BP 122/80   Pulse 100   Temp 98.6 F (37 C)   Resp 16   Ht '5\' 5"'$  (1.651 m)   Wt 180 lb (81.6 kg)   SpO2 95%   BMI 29.95 kg/m  Vitals:   01/24/19 1406  BP: 122/80  Pulse: 100  Resp: 16  Temp: 98.6 F (37 C)  SpO2: 95%  Weight: 180 lb (81.6 kg)  Height: '5\' 5"'$  (1.651 m)     Physical Exam Vitals signs reviewed.  HENT:     Head: Normocephalic and atraumatic.     Right Ear: Tympanic membrane and external ear normal.     Left Ear: Tympanic membrane and external ear normal.  Eyes:     General: No scleral icterus. Neck:     Musculoskeletal: No muscular tenderness.     Vascular: No carotid bruit.  Cardiovascular:     Rate and Rhythm: Normal rate and regular rhythm.     Heart sounds: Normal heart sounds.  Pulmonary:     Breath sounds: Normal breath sounds.  Abdominal:     Palpations: Abdomen is soft.  Musculoskeletal:     Comments: Pain with abduction of right shoulder beyond 90degrees.  Lymphadenopathy:     Cervical: No cervical adenopathy.  Skin:    General: Skin is warm and dry.  Neurological:     General: No focal deficit present.     Mental Status: She is alert and oriented to person, place, and time.  Psychiatric:        Mood and Affect: Mood normal.        Behavior: Behavior normal.        Thought Content: Thought content normal.        Judgment: Judgment normal.      No results found for any visits on 01/24/19.      Assessment & Plan    1. Acute pain of right shoulder Chronic shoulder radiculopathy--may need ortho referral. - DG Shoulder Right; Future - meloxicam (MOBIC) 7.5 MG tablet; Take 1 tablet (7.5 mg total) by mouth daily.  Dispense: 30 tablet; Refill: 1 - Sedimentation rate  2. Neck pain Chronic issue--pt has had  several surgeries. She might also have TMJ. - DG Cervical Spine Complete; Future - meloxicam (MOBIC) 7.5 MG tablet; Take 1 tablet (7.5 mg total) by mouth daily.  Dispense: 30 tablet; Refill: 1 - Sedimentation rate  3. Type 2 diabetes mellitus with complication, without long-term current use of insulin (HCC) A1C is 8.3 today - Comprehensive metabolic panel  4. Type 2 diabetes mellitus with diabetic polyneuropathy, without long-term current use of insulin ([facility])   5. Essential hypertension Controlled. - CBC with Differential/Platelet - TSH  6. Chronic anemia Anemia of chronic disease.More than 50% of 40 minute visit spent in counseling or coordination of care   7. Shoulder pain, unspecified chronicity, unspecified laterality Mobic for 1 month only.  8. Radicular pain of both lower extremities   9. Spinal stenosis of lumbar region with neurogenic claudication Goes to water exercises at pool twice a week. 10.Headache Mild--obtain ESR to r/o temporal arteritis.     Cranford Mon, MD  [facility]

## 2019-01-25 ENCOUNTER — Telehealth: Payer: Self-pay

## 2019-01-25 LAB — CBC WITH DIFFERENTIAL/PLATELET
Basophils Absolute: 0.1 10*3/uL (ref 0.0–0.2)
Basos: 1 %
EOS (ABSOLUTE): 0.2 10*3/uL (ref 0.0–0.4)
Eos: 2 %
Hematocrit: 31 % — ABNORMAL LOW (ref 34.0–46.6)
Hemoglobin: 10.3 g/dL — ABNORMAL LOW (ref 11.1–15.9)
Immature Grans (Abs): 0.1 10*3/uL (ref 0.0–0.1)
Immature Granulocytes: 1 %
Lymphocytes Absolute: 3.9 10*3/uL — ABNORMAL HIGH (ref 0.7–3.1)
Lymphs: 40 %
MCH: 38 pg — ABNORMAL HIGH (ref 26.6–33.0)
MCHC: 33.2 g/dL (ref 31.5–35.7)
MCV: 114 fL — ABNORMAL HIGH (ref 79–97)
Monocytes Absolute: 0.8 10*3/uL (ref 0.1–0.9)
Monocytes: 9 %
Neutrophils Absolute: 4.5 10*3/uL (ref 1.4–7.0)
Neutrophils: 47 %
Platelets: 258 10*3/uL (ref 150–450)
RBC: 2.71 x10E6/uL — CL (ref 3.77–5.28)
RDW: 15.8 % — ABNORMAL HIGH (ref 11.7–15.4)
WBC: 9.6 10*3/uL (ref 3.4–10.8)

## 2019-01-25 LAB — COMPREHENSIVE METABOLIC PANEL
ALT: 18 IU/L (ref 0–32)
AST: 23 IU/L (ref 0–40)
Albumin/Globulin Ratio: 1.8 (ref 1.2–2.2)
Albumin: 4.2 g/dL (ref 3.5–4.6)
Alkaline Phosphatase: 87 IU/L (ref 39–117)
BUN/Creatinine Ratio: 13 (ref 12–28)
BUN: 21 mg/dL (ref 10–36)
Bilirubin Total: 0.3 mg/dL (ref 0.0–1.2)
CO2: 26 mmol/L (ref 20–29)
Calcium: 9.2 mg/dL (ref 8.7–10.3)
Chloride: 102 mmol/L (ref 96–106)
Creatinine, Ser: 1.59 mg/dL — ABNORMAL HIGH (ref 0.57–1.00)
GFR calc Af Amer: 32 mL/min/{1.73_m2} — ABNORMAL LOW (ref >59)
GFR calc non Af Amer: 28 mL/min/{1.73_m2} — ABNORMAL LOW (ref >59)
Globulin, Total: 2.4 g/dL (ref 1.5–4.5)
Glucose: 170 mg/dL — ABNORMAL HIGH (ref 65–99)
Potassium: 5 mmol/L (ref 3.5–5.2)
Sodium: 142 mmol/L (ref 134–144)
Total Protein: 6.6 g/dL (ref 6.0–8.5)

## 2019-01-25 LAB — SEDIMENTATION RATE: Sed Rate: 33 mm/hr (ref 0–40)

## 2019-01-25 LAB — TSH: TSH: 2.68 u[IU]/mL (ref 0.450–4.500)

## 2019-01-25 NOTE — Telephone Encounter (Signed)
Patient advised as below.  

## 2019-01-25 NOTE — Telephone Encounter (Signed)
-----   Message from Jerrol Banana., MD sent at 01/25/2019  8:20 AM EDT ----- Labs stable.

## 2019-01-25 NOTE — Telephone Encounter (Signed)
-----   Message from Jerrol Banana., MD sent at 01/25/2019  8:19 AM EDT ----- Some arthritis in shoulder.

## 2019-01-25 NOTE — Telephone Encounter (Signed)
-----   Message from Jerrol Banana., MD sent at 01/25/2019  8:19 AM EDT ----- Extensive degenerative changes of neck.

## 2019-01-29 ENCOUNTER — Other Ambulatory Visit: Payer: Self-pay | Admitting: Family Medicine

## 2019-01-29 NOTE — Telephone Encounter (Signed)
Left message to call back  

## 2019-01-29 NOTE — Telephone Encounter (Signed)
Still having pain in jaw and going up to ear when swallowing.  Please call pt back to discuss.  Thanks, [employer]

## 2019-01-29 NOTE — Telephone Encounter (Signed)
Patient reports the pain in her jaw radiates to her ear. It is painful to open and close her mouth. She also has difficulty swallowing. She has taken Mobic 7.5mg  daily as prescribed, but it hasn't helped. She is wanting to know what else she can take for persistent jaw pain? [facility] [address] in [place]. Please advise. Thanks!

## 2019-01-30 MED ORDER — PREDNISONE 10 MG (21) PO TBPK: ORAL_TABLET | ORAL | 0 refills | Status: DC

## 2019-01-30 NOTE — Telephone Encounter (Signed)
I spoke with patient and her caregiver and they have concerns of infection in her mouth and states that she has oral pain and sensitivity to touch on the right side. Caregiver states that swelling is recent and wants to be prescribed antibiotic. Amparo [place]

## 2019-01-30 NOTE — Telephone Encounter (Signed)
Stop meloxicam and try Prednisone 10mg  6 day taper.

## 2019-02-01 ENCOUNTER — Telehealth: Payer: Self-pay | Admitting: Family Medicine

## 2019-02-01 NOTE — Telephone Encounter (Signed)
Pt came in last Thursday had labs and xray.  Her pain is still bad and they called Tuesday to see if there was something else for pain in jaw.  Caregiver is wondering if it is an infection and she also needs an antibiotic  [place] [facility]  CB#  [place]

## 2019-02-01 NOTE — Telephone Encounter (Signed)
We can try amoxil 500mg  TID for 1 week. For presumed dental infection

## 2019-02-01 NOTE — Telephone Encounter (Signed)
Please review. Thanks!  

## 2019-02-02 MED ORDER — AMOXICILLIN 500 MG PO CAPS: 500.0000 mg | ORAL_CAPSULE | Freq: Three times a day (TID) | ORAL | 0 refills | Status: AC

## 2019-02-02 NOTE — Telephone Encounter (Signed)
Medication sent into the pharmacy. 

## 2019-02-02 NOTE — Telephone Encounter (Signed)
This was taken care of

## 2019-02-07 NOTE — Progress Notes (Signed)
Subjective:   Renee Carpenter is a 85 y.o. female who presents for Medicare Annual (Subsequent) preventive examination.    This visit is being conducted through telemedicine due to the COVID-19 pandemic. This patient has given me verbal consent via doximity to conduct this visit, patient states they are participating from their home address. Some vital signs may be absent or patient reported.    Patient identification: identified by name, DOB, and current address  Review of Systems:  N/A  Cardiac Risk Factors include: advanced age (>52mn, >>72women);diabetes mellitus     Objective:     Vitals: There were no vitals taken for this visit.  There is no height or weight on file to calculate BMI. Unable to obtain vitals due to visit being conducted via telephonically.   Advanced Directives 02/08/2019 02/27/2018 08/24/2017 01/19/2017 04/20/2016 04/07/2016 04/14/2011  Does Patient Have a Medical Advance Directive? _0  Yes -  Type of AParamedicof [place] will Healthcare Power of [place] will [place] will Living will;Healthcare Power of [place] will [place]in Chart? No - copy requested - No - copy requested No - copy requested No - copy requested - -  Pre-existing out of facility DNR order (yellow form or pink MOST form) - - - - - - No    Tobacco Social History   Tobacco Use  Smoking Status Never Smoker  Smokeless Tobacco Never Used     Counseling given: Not Answered   Clinical Intake:  Pre-visit preparation completed: Yes  Pain : 0-10 Pain Score: 2  Pain Type: Acute pain Pain Location: Jaw Pain Descriptors / Indicators: Aching Pain Frequency: Intermittent     Nutritional Risks: None Diabetes: Yes  How often do you need to have someone help you when you read  instructions, pamphlets, or other written materials from your doctor or pharmacy?: 1 - Never   Diabetes:  Is the patient diabetic?  Yes type 2 If diabetic, was a CBG obtained today?  No  Did the patient bring in their glucometer from home?  No  How often do you monitor your CBG's? Once a week.   Financial Strains and Diabetes Management:  Are you having any financial strains with the device, your supplies or your medication? No .  Does the patient want to be seen by Chronic Care Management for management of their diabetes?  No  Would the patient like to be referred to a Nutritionist or for Diabetic Management?  No   Diabetic Exams:  Diabetic Eye Exam: Completed 08/30/17. Overdue for diabetic eye exam. Pt has been advised about the importance in completing this exam.   Diabetic Foot Exam: Completed 05/23/17. Pt has been advised about the importance in completing this exam. Note made to follow up on this at next in office visit.     Interpreter Needed?: No  Information entered by :: [facility] LPN  Past Medical History:  Diagnosis Date  . Anemia    WAS ANEMIC  . Arthritis   . Blood transfusion    HAD 1 SEVERAL YRS AGO...3-4 YR  . Chronic kidney disease    SOME DECREASE IN KIDNEY FUNCTION  . Diabetes mellitus without complication (HSelma   . Hypertension   . Normal cardiac stress test    REQUESTING COPY FROM [facility]  . Normal echocardiogram    REQUESTING THAT  RESULT -- DR [name]   . PONV (postoperative nausea and vomiting)   . Shortness of breath   . Spondylolysis 1998   Past Surgical History:  Procedure Laterality Date  . ABDOMINAL HYSTERECTOMY    . BACK SURGERY     2 CERVICAL SURGERIES ALSO  . CATARACTS     RIGHT EYE CATARACT  . CERVICAL DISCECTOMY  08/1995 and 2011   C4-,C5-6, C6-7 anterior cervical; Dr. Ellene Route fusion and plating  . LAMINECTOMY     decompressive; Dr. Burman Riis in [place] MICRODISCECTOMY  04/14/2011   Procedure:  LUMBAR LAMINECTOMY/DECOMPRESSION MICRODISCECTOMY;  Surgeon: [name];  Location: [facility];  Service: Neurosurgery;  Laterality: N/A;  Lumbar Three and Lumbar Four Laminectomy  . TONSILLECTOMY AND ADENOIDECTOMY  1939   Family History  Problem Relation Age of Onset  . Heart failure Father   . Heart disease Father   . Hypertension Mother   . Diabetes Mother   . Heart disease Mother   . Cancer Sister        breast  . Breast cancer Sister 3  . Cancer Maternal Grandfather   . Cancer Maternal Aunt        x3  . Breast cancer Daughter 78   Social History   Socioeconomic History  . Marital status: Widowed    Spouse name: Not on file  . Number of children: 1  . Years of education: Not on file  . Highest education level: Bachelor's degree (e.g., BA, AB, BS)  Occupational History  . Occupation: retired  Scientific laboratory technician  . Financial resource strain: Not hard at all  . Food insecurity    Worry: Never true    Inability: Never true  . Transportation needs    Medical: No    Non-medical: No  Tobacco Use  . Smoking status: Never Smoker  . Smokeless tobacco: Never Used  Substance and Sexual Activity  . Alcohol use: No  . Drug use: No  . Sexual activity: Never  Lifestyle  . Physical activity    Days per week: 0 days    Minutes per session: 0 min  . Stress: To some extent  Relationships  . Social Herbalist on phone: Patient refused    Gets together: Patient refused    Attends religious service: Patient refused    Active member of club or organization: Patient refused    Attends meetings of clubs or organizations: Patient refused    Relationship status: Patient refused  Other Topics Concern  . Not on file  Social History Narrative  . Not on file    Outpatient Encounter Medications as of 02/08/2019  Medication Sig  . acetaminophen (TYLENOL) 500 MG tablet Take 500 mg by mouth every 6 (six) hours as needed.  Marland Kitchen albuterol (VENTOLIN HFA) 108 (90 Base) MCG/ACT  inhaler Inhale 2 puffs into the lungs every 4 (four) hours as needed for wheezing or shortness of breath.  . Alpha-Lipoic Acid 600 MG TABS Take 600 mg by mouth daily.   Marland Kitchen amoxicillin (AMOXIL) 500 MG capsule Take 1 capsule (500 mg total) by mouth 3 (three) times daily for 7 days.  Marland Kitchen aspirin EC 81 MG tablet Take 81 mg by mouth daily.   . blood glucose meter kit and supplies KIT Dispense based on patient and insurance preference. Use up to four times daily as directed. (FOR ICD-9 250.00, 250.01).  . Coenzyme Q10 (CO Q10) 200 MG CAPS Take by mouth daily.   Marland Kitchen  DULoxetine (CYMBALTA) 20 MG capsule Take 1 capsule (20 mg total) by mouth daily.  Marland Kitchen esomeprazole (NEXIUM) 40 MG capsule TAKE 1 CAPSULE BY MOUTH EVERY DAY  . gabapentin (NEURONTIN) 600 MG tablet Take 1 tablet (600 mg total) by mouth 3 (three) times daily.  Marland Kitchen glimepiride (AMARYL) 4 MG tablet TAKE 1 TABLET(4 MG) BY MOUTH DAILY BEFORE BREAKFAST  . glucose blood (ACCU-CHEK AVIVA PLUS) test strip Check blood sugar once daily, fasting Use as instructed  . hydroxypropyl methylcellulose (ISOPTO TEARS) 2.5 % ophthalmic solution Place 1 drop into both eyes 3 (three) times daily as needed. Dry eyes   . Lancets (ACCU-CHEK SOFT TOUCH) lancets Use as instructed  . losartan (COZAAR) 25 MG tablet TAKE 1 TABLET BY MOUTH EVERY DAY  . MAGNESIUM-OXIDE 400 (241.3 Mg) MG tablet TAKE 1 TABLET BY MOUTH TWICE DAILY  . meloxicam (MOBIC) 7.5 MG tablet Take 1 tablet (7.5 mg total) by mouth daily.  . MULTIPLE VITAMIN PO Take by mouth.  . predniSONE (STERAPRED UNI-PAK 21 TAB) 10 MG (21) TBPK tablet Take as per directed  . rOPINIRole (REQUIP) 1 MG tablet Take 1 mg by mouth. 1/2 tablet four times daily.  . tamsulosin (FLOMAX) 0.4 MG CAPS capsule TAKE 1 CAPSULE(0.4 MG) BY MOUTH DAILY  . pregabalin (LYRICA) 25 MG capsule Take 1 capsule (25 mg total) by mouth daily. (Patient not taking: Reported on 02/08/2019)  . senna-docusate (SENOKOT-S) 8.6-50 MG tablet Take 1 tablet by mouth  daily as needed.   No facility-administered encounter medications on file as of 02/08/2019.     Activities of Daily Living In your present state of health, do you have any difficulty performing the following activities: 02/08/2019  Hearing? Y  Comment Has hearing aids but does not wear them.  Vision? Y  Comment Pt to schedule an eye exam soon.  Difficulty concentrating or making decisions? N  Walking or climbing stairs? Y  Comment Unable to walk.  Dressing or bathing? N  Doing errands, shopping? Y  Comment Does not drive.  Preparing Food and eating ? Y  Comment Has a caregiver to assist with cooking.  Using the Toilet? N  In the past six months, have you accidently leaked urine? N  Do you have problems with loss of bowel control? N  Managing your Medications? Y  Comment Caregiver manages medications.  Managing your Finances? N  Housekeeping or managing your Housekeeping? Y  Comment Has a caregiver for cleaning.  Some recent data might be hidden    Patient Care Team: Jerrol Banana., MD as PCP - General (Unknown Physician Specialty) Vladimir Crofts, MD as Consulting Physician (Neurology) Troxler, Adele Schilder as Attending Physician (Podiatry) Jenne Campus, MD as Referring Physician (Neurosurgery) Ralene Bathe, MD as Consulting Physician (Dermatology) Leandrew Koyanagi, MD as Referring Physician (Ophthalmology)    Assessment:   This is a routine wellness examination for Renee Carpenter.  Exercise Activities and Dietary recommendations Current Exercise Habits: Home exercise routine, Type of exercise: Other - see comments(Has rehab 1 day a week and goes to the pool 2 days a week.), Time (Minutes): 45, Frequency (Times/Week): 3, Weekly Exercise (Minutes/Week): 135, Intensity: Mild, Exercise limited by: Other - see comments(Unable to walk.)  Goals    . Increase water intake     Starting 04/20/16, I will increase my water intake to 4 glasses a day.       Fall Risk:  Fall Risk  02/08/2019 08/24/2017 01/19/2017 04/20/2016 08/27/2015  Falls in the past year?  0 Yes No Yes No  Number falls in past yr: - 2 or more - 1 -  Injury with Fall? - No - No -  Risk for fall due to : - - - Impaired balance/gait -  Follow up - Falls prevention discussed - - -    FALL RISK PREVENTION PERTAINING TO THE HOME:  Any stairs in or around the home? No  If so, are there any without handrails? N/A  Home free of loose throw rugs in walkways, pet beds, electrical cords, etc? Yes  Adequate lighting in your home to reduce risk of falls? Yes   ASSISTIVE DEVICES UTILIZED TO PREVENT FALLS:  Life alert? Yes  Use of a cane, walker or w/c? Yes  Grab bars in the bathroom? Yes  Shower chair or bench in shower? Yes  Elevated toilet seat or a handicapped toilet? No   TIMED UP AND GO:  Was the test performed? No .    Depression Screen PHQ 2/9 Scores 02/08/2019 08/24/2017 05/23/2017 01/19/2017  PHQ - 2 Score 0 _0 PHQ- 9 Score - - 3 -     Cognitive Function     6CIT Screen 02/08/2019 01/19/2017 04/20/2016  What Year? 0 points 0 points 0 points  What month? 0 points 0 points 0 points  What time? 0 points 0 points 0 points  Count back from 20 0 points 0 points 0 points  Months in reverse 0 points 0 points 0 points  Repeat phrase 0 points 0 points 0 points  Total Score 0 0 0    Immunization History  Administered Date(s) Administered  . Influenza, High Dose Seasonal PF 04/29/2015, 04/20/2016, 03/24/2017, 05/01/2018  . Pneumococcal Conjugate-13 04/29/2015    Qualifies for Shingles Vaccine? Yes . Due for Shingrix. Education has been provided regarding the importance of this vaccine. Pt has been advised to call insurance company to determine out of pocket expense. Advised may also receive vaccine at local pharmacy or Health Dept. Verbalized acceptance and understanding.  Tdap: Although this vaccine is not a covered service during a Wellness Exam, does the patient still wish to  receive this vaccine today?  No .   Flu Vaccine: Due for Flu vaccine. Does the patient want to receive this vaccine today?  No .   Pneumococcal Vaccine: Completed series  Screening Tests Health Maintenance  Topic Date Due  . FOOT EXAM  05/23/2018  . OPHTHALMOLOGY EXAM  08/31/2018  . HEMOGLOBIN A1C  10/30/2018  . INFLUENZA VACCINE  01/06/2019  . TETANUS/TDAP  04/20/2026 (Originally 12/23/1944)  . DEXA SCAN  Completed  . PNA vac Low Risk Adult  Completed    Cancer Screenings:  Colorectal Screening: No longer required.   Mammogram: No longer required.   Bone Density: Completed 12/29/10. Results not on file and pt does not recall. Ordered today. Pt provided with contact info and advised to call to schedule appt. Pt aware the office will call re: appt.  Lung Cancer Screening: (Low Dose CT Chest recommended if Age 81-80 years, 30 pack-year currently smoking OR have quit w/in 15years.) does not qualify.   Additional Screening:  Dental Screening: Recommended annual dental exams for proper oral hygiene   Community Resource Referral:  CRR required this visit?  No       Plan:  I have personally reviewed and addressed the Medicare Annual Wellness questionnaire and have noted the following in the patient's chart:  A. Medical and social history B. Use of alcohol, tobacco  or illicit drugs  C. Current medications and supplements D. Functional ability and status E.  Nutritional status F.  Physical activity G. Advance directives H. List of other physicians I.  Hospitalizations, surgeries, and ER visits in previous 12 months J.  [place] such as hearing and vision if needed, cognitive and depression L. Referrals and appointments   In addition, I have reviewed and discussed with patient certain preventive protocols, quality metrics, and best practice recommendations. A written personalized care plan for preventive services as well as general preventive health  recommendations were provided to patient.   Glendora Score, [place]  01/06/7077 Nurse Health Advisor   Nurse Notes: Pt to receive at an influenza vaccine at today's OV. Pt needs a diabetic foot exam and Hgb A1c checked at next diabetic follow up apt. Pt to schedule an eye exam soon.

## 2019-02-08 ENCOUNTER — Other Ambulatory Visit: Payer: Self-pay

## 2019-02-08 ENCOUNTER — Ambulatory Visit (INDEPENDENT_AMBULATORY_CARE_PROVIDER_SITE_OTHER): Payer: Medicare Other | Admitting: Family Medicine

## 2019-02-08 ENCOUNTER — Ambulatory Visit (INDEPENDENT_AMBULATORY_CARE_PROVIDER_SITE_OTHER): Payer: Medicare Other

## 2019-02-08 ENCOUNTER — Encounter: Payer: Self-pay | Admitting: Family Medicine

## 2019-02-08 VITALS — BP 151/83 | HR 94 | Temp 98.3°F | Resp 18 | Wt 182.8 lb

## 2019-02-08 DIAGNOSIS — Z13820 Encounter for screening for osteoporosis: Secondary | ICD-10-CM | POA: Diagnosis not present

## 2019-02-08 DIAGNOSIS — M5090 Cervical disc disorder, unspecified, unspecified cervical region: Secondary | ICD-10-CM

## 2019-02-08 DIAGNOSIS — R6884 Jaw pain: Secondary | ICD-10-CM

## 2019-02-08 DIAGNOSIS — Z0000 Encounter for general adult medical examination without abnormal findings: Secondary | ICD-10-CM | POA: Diagnosis not present

## 2019-02-08 DIAGNOSIS — I10 Essential (primary) hypertension: Secondary | ICD-10-CM

## 2019-02-08 DIAGNOSIS — M5137 Other intervertebral disc degeneration, lumbosacral region: Secondary | ICD-10-CM

## 2019-02-08 DIAGNOSIS — G4752 REM sleep behavior disorder: Secondary | ICD-10-CM | POA: Insufficient documentation

## 2019-02-08 DIAGNOSIS — K047 Periapical abscess without sinus: Secondary | ICD-10-CM | POA: Diagnosis not present

## 2019-02-08 DIAGNOSIS — E1142 Type 2 diabetes mellitus with diabetic polyneuropathy: Secondary | ICD-10-CM | POA: Diagnosis not present

## 2019-02-08 DIAGNOSIS — N183 Chronic kidney disease, stage 3 unspecified: Secondary | ICD-10-CM

## 2019-02-08 NOTE — Patient Instructions (Addendum)
Renee Carpenter , Thank you for taking time to come for your Medicare Wellness Visit. I appreciate your ongoing commitment to your health goals. Please review the following plan we discussed and let me know if I can assist you in the future.   Screening recommendations/referrals: Colonoscopy: No longer required.  Mammogram: No longer required.  Bone Density: Ordered today. Pt provided with contact info and advised to call to schedule appt. Pt aware the office will call re: appt. Recommended yearly ophthalmology/optometry visit for glaucoma screening and checkup Recommended yearly dental visit for hygiene and checkup  Vaccinations: Influenza vaccine: Currentlydue Pneumococcal vaccine: Completed series Tdap vaccine: Pt to receive at today's OV.   Shingles vaccine: Pt declines today.     Advanced directives: Please bring a copy of your POA (Power of Attorney) and/or Living Will to your next appointment.   Conditions/risks identified: Recommend to continue increasing water intake to 6-8 8 oz glasses a day.   Next appointment: 1:20 PM with Dr Rosanna Randy. Declined scheduling an AWV for 2021 or a CPE for this year.    Preventive Care 21 Years and Older, Female Preventive care refers to lifestyle choices and visits with your health care provider that can promote health and wellness. What does preventive care include?  A yearly physical exam. This is also called an annual well check.  Dental exams once or twice a year.  Routine eye exams. Ask your health care provider how often you should have your eyes checked.  Personal lifestyle choices, including:  Daily care of your teeth and gums.  Regular physical activity.  Eating a healthy diet.  Avoiding tobacco and drug use.  Limiting alcohol use.  Practicing safe sex.  Taking low-dose aspirin every day.  Taking vitamin and mineral supplements as recommended by your health care provider. What happens during an annual well check? The  services and screenings done by your health care provider during your annual well check will depend on your age, overall health, lifestyle risk factors, and family history of disease. Counseling  Your health care provider may ask you questions about your:  Alcohol use.  Tobacco use.  Drug use.  Emotional well-being.  Home and relationship well-being.  Sexual activity.  Eating habits.  History of falls.  Memory and ability to understand (cognition).  Work and work Statistician.  Reproductive health. Screening  You may have the following tests or measurements:  Height, weight, and BMI.  Blood pressure.  Lipid and cholesterol levels. These may be checked every 5 years, or more frequently if you are over 60 years old.  Skin check.  Lung cancer screening. You may have this screening every year starting at age 52 if you have a 30-pack-year history of smoking and currently smoke or have quit within the past 15 years.  Fecal occult blood test (FOBT) of the stool. You may have this test every year starting at age 16.  Flexible sigmoidoscopy or colonoscopy. You may have a sigmoidoscopy every 5 years or a colonoscopy every 10 years starting at age 38.  Hepatitis C blood test.  Hepatitis B blood test.  Sexually transmitted disease (STD) testing.  Diabetes screening. This is done by checking your blood sugar (glucose) after you have not eaten for a while (fasting). You may have this done every 1-3 years.  Bone density scan. This is done to screen for osteoporosis. You may have this done starting at age 34.  Mammogram. This may be done every 1-2 years. Talk to your health  care provider about how often you should have regular mammograms. Talk with your health care provider about your test results, treatment options, and if necessary, the need for more tests. Vaccines  Your health care provider may recommend certain vaccines, such as:  Influenza vaccine. This is recommended  every year.  Tetanus, diphtheria, and acellular pertussis (Tdap, Td) vaccine. You may need a Td booster every 10 years.  Zoster vaccine. You may need this after age 21.  Pneumococcal 13-valent conjugate (PCV13) vaccine. One dose is recommended after age 68.  Pneumococcal polysaccharide (PPSV23) vaccine. One dose is recommended after age 46. Talk to your health care provider about which screenings and vaccines you need and how often you need them. This information is not intended to replace advice given to you by your health care provider. Make sure you discuss any questions you have with your health care provider. Document Released: 06/20/2015 Document Revised: 02/11/2016 Document Reviewed: 03/25/2015 Elsevier Interactive Patient Education  2017 [place] Prevention in the Home Falls can cause injuries. They can happen to people of all ages. There are many things you can do to make your home safe and to help prevent falls. What can I do on the outside of my home?  Regularly fix the edges of walkways and driveways and fix any cracks.  Remove anything that might make you trip as you walk through a door, such as a raised step or threshold.  Trim any bushes or trees on the path to your home.  Use bright outdoor lighting.  Clear any walking paths of anything that might make someone trip, such as rocks or tools.  Regularly check to see if handrails are loose or broken. Make sure that both sides of any steps have handrails.  Any raised decks and porches should have guardrails on the edges.  Have any leaves, snow, or ice cleared regularly.  Use sand or salt on walking paths during winter.  Clean up any spills in your garage right away. This includes oil or grease spills. What can I do in the bathroom?  Use night lights.  Install grab bars by the toilet and in the tub and shower. Do not use towel bars as grab bars.  Use non-skid mats or decals in the tub or shower.  If  you need to sit down in the shower, use a plastic, non-slip stool.  Keep the floor dry. Clean up any water that spills on the floor as soon as it happens.  Remove soap buildup in the tub or shower regularly.  Attach bath mats securely with double-sided non-slip rug tape.  Do not have throw rugs and other things on the floor that can make you trip. What can I do in the bedroom?  Use night lights.  Make sure that you have a light by your bed that is easy to reach.  Do not use any sheets or blankets that are too big for your bed. They should not hang down onto the floor.  Have a firm chair that has side arms. You can use this for support while you get dressed.  Do not have throw rugs and other things on the floor that can make you trip. What can I do in the kitchen?  Clean up any spills right away.  Avoid walking on wet floors.  Keep items that you use a lot in easy-to-reach places.  If you need to reach something above you, use a strong step stool that has a grab  bar.  Keep electrical cords out of the way.  Do not use floor polish or wax that makes floors slippery. If you must use wax, use non-skid floor wax.  Do not have throw rugs and other things on the floor that can make you trip. What can I do with my stairs?  Do not leave any items on the stairs.  Make sure that there are handrails on both sides of the stairs and use them. Fix handrails that are broken or loose. Make sure that handrails are as long as the stairways.  Check any carpeting to make sure that it is firmly attached to the stairs. Fix any carpet that is loose or worn.  Avoid having throw rugs at the top or bottom of the stairs. If you do have throw rugs, attach them to the floor with carpet tape.  Make sure that you have a light switch at the top of the stairs and the bottom of the stairs. If you do not have them, ask someone to add them for you. What else can I do to help prevent falls?  Wear shoes  that:  Do not have high heels.  Have rubber bottoms.  Are comfortable and fit you well.  Are closed at the toe. Do not wear sandals.  If you use a stepladder:  Make sure that it is fully opened. Do not climb a closed stepladder.  Make sure that both sides of the stepladder are locked into place.  Ask someone to hold it for you, if possible.  Clearly mark and make sure that you can see:  Any grab bars or handrails.  First and last steps.  Where the edge of each step is.  Use tools that help you move around (mobility aids) if they are needed. These include:  Canes.  Walkers.  Scooters.  Crutches.  Turn on the lights when you go into a dark area. Replace any light bulbs as soon as they burn out.  Set up your furniture so you have a clear path. Avoid moving your furniture around.  If any of your floors are uneven, fix them.  If there are any pets around you, be aware of where they are.  Review your medicines with your doctor. Some medicines can make you feel dizzy. This can increase your chance of falling. Ask your doctor what other things that you can do to help prevent falls. This information is not intended to replace advice given to you by your health care provider. Make sure you discuss any questions you have with your health care provider. Document Released: 03/20/2009 Document Revised: 10/30/2015 Document Reviewed: 06/28/2014 Elsevier Interactive Patient Education  2017 [employer].

## 2019-02-08 NOTE — Patient Instructions (Signed)
Finish all antibiotics

## 2019-02-08 NOTE — Progress Notes (Signed)
Patient: Renee Carpenter Female    DOB: January 29, 1926   85 y.o.   MRN: [id number] Visit Date: 02/08/2019  Today's Provider: [name], MD   Chief Complaint  Patient presents with  . Follow-up  . Neck Pain   Subjective:     HPI 3 Week follow up for neck pain. Patient is still having pain under right jaw. She just picked up prescription we called in for possible tooth abscess. She has been seen by oral surgery and dentistry at [facility].  Allergies  Allergen Reactions  . Acetaminophen Itching and Other (See Comments)    Stomach upset  . Aspirin Itching and Other (See Comments)    Stomach pain  . Metformin And Related     nausea  . Other Other (See Comments)    Novocaine, strange feeling  . Sulfa Antibiotics Other (See Comments)  . Tizanidine     Diarrhea, almost passed out, felt dizzy and confused  . Caffeine Palpitations  . Ginger Palpitations  . Ibuprofen Rash     Current Outpatient Medications:  .  acetaminophen (TYLENOL) 500 MG tablet, Take 500 mg by mouth every 6 (six) hours as needed., Disp: , Rfl:  .  albuterol (VENTOLIN HFA) 108 (90 Base) MCG/ACT inhaler, Inhale 2 puffs into the lungs every 4 (four) hours as needed for wheezing or shortness of breath., Disp: 8 g, Rfl: 11 .  Alpha-Lipoic Acid 600 MG TABS, Take 600 mg by mouth daily. , Disp: , Rfl:  .  amoxicillin (AMOXIL) 500 MG capsule, Take 1 capsule (500 mg total) by mouth 3 (three) times daily for 7 days., Disp: 21 capsule, Rfl: 0 .  aspirin EC 81 MG tablet, Take 81 mg by mouth daily. , Disp: , Rfl:  .  blood glucose meter kit and supplies KIT, Dispense based on patient and insurance preference. Use up to four times daily as directed. (FOR ICD-9 250.00, 250.01)., Disp: 1 each, Rfl: 0 .  Coenzyme Q10 (CO Q10) 200 MG CAPS, Take by mouth daily. , Disp: , Rfl:  .  DULoxetine (CYMBALTA) 20 MG capsule, Take 1 capsule (20 mg total) by mouth daily., Disp: 30 capsule, Rfl: 3 .  esomeprazole (NEXIUM) 40 MG  capsule, TAKE 1 CAPSULE BY MOUTH EVERY DAY, Disp: 90 capsule, Rfl: 5 .  gabapentin (NEURONTIN) 600 MG tablet, Take 1 tablet (600 mg total) by mouth 3 (three) times daily., Disp: 270 tablet, Rfl: 3 .  glimepiride (AMARYL) 4 MG tablet, TAKE 1 TABLET(4 MG) BY MOUTH DAILY BEFORE BREAKFAST, Disp: 30 tablet, Rfl: 12 .  glucose blood (ACCU-CHEK AVIVA PLUS) test strip, Check blood sugar once daily, fasting Use as instructed, Disp: 100 each, Rfl: 12 .  hydroxypropyl methylcellulose (ISOPTO TEARS) 2.5 % ophthalmic solution, Place 1 drop into both eyes 3 (three) times daily as needed. Dry eyes , Disp: , Rfl:  .  Lancets (ACCU-CHEK SOFT TOUCH) lancets, Use as instructed, Disp: 100 each, Rfl: 12 .  losartan (COZAAR) 25 MG tablet, TAKE 1 TABLET BY MOUTH EVERY DAY, Disp: 90 tablet, Rfl: 3 .  MAGNESIUM-OXIDE 400 (241.3 Mg) MG tablet, TAKE 1 TABLET BY MOUTH TWICE DAILY, Disp: 60 tablet, Rfl: 11 .  meloxicam (MOBIC) 7.5 MG tablet, Take 1 tablet (7.5 mg total) by mouth daily., Disp: 30 tablet, Rfl: 1 .  MULTIPLE VITAMIN PO, Take by mouth., Disp: , Rfl:  .  predniSONE (STERAPRED UNI-PAK 21 TAB) 10 MG (21) TBPK tablet, Take as per directed, Disp:  21 tablet, Rfl: 0 .  pregabalin (LYRICA) 25 MG capsule, Take 1 capsule (25 mg total) by mouth daily. (Patient not taking: Reported on 02/08/2019), Disp: 30 capsule, Rfl: 5 .  rOPINIRole (REQUIP) 1 MG tablet, Take 1 mg by mouth. 1/2 tablet four times daily., Disp: , Rfl:  .  senna-docusate (SENOKOT-S) 8.6-50 MG tablet, Take 1 tablet by mouth daily as needed., Disp: , Rfl:  .  tamsulosin (FLOMAX) 0.4 MG CAPS capsule, TAKE 1 CAPSULE(0.4 MG) BY MOUTH DAILY, Disp: 90 capsule, Rfl: 3  Review of Systems  Constitutional: Negative.   HENT: Positive for congestion.   Eyes: Negative.   Respiratory: Positive for cough.   Cardiovascular: Negative.   Gastrointestinal: Negative.   Endocrine: Negative.   Genitourinary: Negative.   Musculoskeletal: Positive for back pain.   Allergic/Immunologic: Negative.   Neurological: Positive for weakness.  Psychiatric/Behavioral: Negative.     Social History   Tobacco Use  . Smoking status: Never Smoker  . Smokeless tobacco: Never Used  Substance Use Topics  . Alcohol use: No      Objective:   BP (!) 151/83 (BP Location: Left Arm, Patient Position: Sitting, Cuff Size: Normal)   Pulse 94   Temp 98.3 F (36.8 C) (Temporal)   Resp 18   Wt 182 lb 12.8 oz (82.9 kg)   SpO2 94%   BMI 30.42 kg/m  Vitals:   02/08/19 1334  BP: (!) 151/83  Pulse: 94  Resp: 18  Temp: 98.3 F (36.8 C)  TempSrc: Temporal  SpO2: 94%  Weight: 182 lb 12.8 oz (82.9 kg)  Body mass index is 30.42 kg/m.   Physical Exam Vitals signs reviewed.  Constitutional:      Appearance: Normal appearance.  HENT:     Head: Normocephalic and atraumatic.     Right Ear: External ear normal.     Left Ear: External ear normal.     Nose: Nose normal.     Mouth/Throat:     Mouth: Mucous membranes are moist.     Pharynx: Oropharynx is clear.  Eyes:     General: No scleral icterus. Neck:     Comments: Mild tenderness of right mandible just below TMJ. Cardiovascular:     Rate and Rhythm: Normal rate and regular rhythm.     Pulses: Normal pulses.     Heart sounds: Normal heart sounds.  Pulmonary:     Breath sounds: Normal breath sounds.  Abdominal:     Palpations: Abdomen is soft.  Lymphadenopathy:     Cervical: No cervical adenopathy.  Skin:    General: Skin is warm and dry.  Neurological:     General: No focal deficit present.     Mental Status: She is alert and oriented to person, place, and time.  Psychiatric:        Mood and Affect: Mood normal.        Behavior: Behavior normal.        Thought Content: Thought content normal.        Judgment: Judgment normal.      No results found for any visits on 02/08/19.     Assessment & Plan    1. Tooth abscess Start antibiotics today.  2. Pain in lower jaw  Noncardiac,nonexertional,no jaw claudication.  3. Essential hypertension   4. Type 2 diabetes mellitus with diabetic polyneuropathy, without long-term current use of insulin ([place])   5. DDD (degenerative disc disease), lumbosacral   6. Chronic kidney disease, stage 3, mod decreased GFR (  [place])   7. Cervical disc disease This also might be contributing to her jaw pain.     Modell Fendrick Cranford Mon, MD  [facility]

## 2019-02-27 ENCOUNTER — Ambulatory Visit
Admission: RE | Admit: 2019-02-27 | Discharge: 2019-02-27 | Disposition: A | Discharge status: Home / Self Care | Payer: Medicare Other | Source: Ambulatory Visit | Attending: Family Medicine | Admitting: Family Medicine

## 2019-02-27 DIAGNOSIS — Z13820 Encounter for screening for osteoporosis: Secondary | ICD-10-CM | POA: Diagnosis present

## 2019-02-27 DIAGNOSIS — Z780 Asymptomatic menopausal state: Secondary | ICD-10-CM | POA: Diagnosis not present

## 2019-02-28 ENCOUNTER — Telehealth: Payer: Self-pay

## 2019-02-28 NOTE — Telephone Encounter (Signed)
-----   Message from Jerrol Banana., MD sent at 02/28/2019  8:43 AM EDT ----- Normal.

## 2019-02-28 NOTE — Telephone Encounter (Signed)
-----   Message from Richard L Gilbert Jr., MD sent at 02/28/2019  8:43 AM EDT ----- Normal. 

## 2019-02-28 NOTE — Telephone Encounter (Signed)
Patient notified of results.

## 2019-03-07 ENCOUNTER — Other Ambulatory Visit (INDEPENDENT_AMBULATORY_CARE_PROVIDER_SITE_OTHER): Payer: Self-pay | Admitting: Vascular Surgery

## 2019-03-07 DIAGNOSIS — M79671 Pain in right foot: Secondary | ICD-10-CM

## 2019-03-08 ENCOUNTER — Encounter (INDEPENDENT_AMBULATORY_CARE_PROVIDER_SITE_OTHER): Payer: Self-pay | Admitting: Vascular Surgery

## 2019-03-08 ENCOUNTER — Other Ambulatory Visit: Payer: Self-pay

## 2019-03-08 ENCOUNTER — Ambulatory Visit (INDEPENDENT_AMBULATORY_CARE_PROVIDER_SITE_OTHER): Payer: Medicare Other | Admitting: Vascular Surgery

## 2019-03-08 ENCOUNTER — Ambulatory Visit (INDEPENDENT_AMBULATORY_CARE_PROVIDER_SITE_OTHER): Payer: Medicare Other

## 2019-03-08 VITALS — BP 132/73 | HR 85 | Resp 16 | Wt 184.0 lb

## 2019-03-08 DIAGNOSIS — I872 Venous insufficiency (chronic) (peripheral): Secondary | ICD-10-CM

## 2019-03-08 DIAGNOSIS — E1142 Type 2 diabetes mellitus with diabetic polyneuropathy: Secondary | ICD-10-CM

## 2019-03-08 DIAGNOSIS — M79671 Pain in right foot: Secondary | ICD-10-CM

## 2019-03-08 DIAGNOSIS — I10 Essential (primary) hypertension: Secondary | ICD-10-CM

## 2019-03-08 DIAGNOSIS — J449 Chronic obstructive pulmonary disease, unspecified: Secondary | ICD-10-CM

## 2019-03-08 DIAGNOSIS — I890 Lymphedema, not elsewhere classified: Secondary | ICD-10-CM

## 2019-03-08 DIAGNOSIS — M79672 Pain in left foot: Secondary | ICD-10-CM

## 2019-03-13 ENCOUNTER — Encounter (INDEPENDENT_AMBULATORY_CARE_PROVIDER_SITE_OTHER): Payer: Self-pay | Admitting: Vascular Surgery

## 2019-03-13 ENCOUNTER — Encounter: Payer: Self-pay | Admitting: Family Medicine

## 2019-03-13 ENCOUNTER — Ambulatory Visit (INDEPENDENT_AMBULATORY_CARE_PROVIDER_SITE_OTHER): Payer: Medicare Other | Admitting: Family Medicine

## 2019-03-13 ENCOUNTER — Other Ambulatory Visit: Payer: Self-pay

## 2019-03-13 VITALS — BP 102/58 | HR 92 | Temp 96.6°F | Resp 16 | Ht 65.0 in | Wt 180.0 lb

## 2019-03-13 DIAGNOSIS — I952 Hypotension due to drugs: Secondary | ICD-10-CM

## 2019-03-13 DIAGNOSIS — I872 Venous insufficiency (chronic) (peripheral): Secondary | ICD-10-CM | POA: Diagnosis not present

## 2019-03-13 DIAGNOSIS — I5022 Chronic systolic (congestive) heart failure: Secondary | ICD-10-CM | POA: Diagnosis not present

## 2019-03-13 DIAGNOSIS — Z23 Encounter for immunization: Secondary | ICD-10-CM | POA: Diagnosis not present

## 2019-03-13 DIAGNOSIS — E0821 Diabetes mellitus due to underlying condition with diabetic nephropathy: Secondary | ICD-10-CM

## 2019-03-13 DIAGNOSIS — R6884 Jaw pain: Secondary | ICD-10-CM

## 2019-03-13 DIAGNOSIS — I890 Lymphedema, not elsewhere classified: Secondary | ICD-10-CM | POA: Insufficient documentation

## 2019-03-13 DIAGNOSIS — R259 Unspecified abnormal involuntary movements: Secondary | ICD-10-CM

## 2019-03-13 NOTE — Patient Instructions (Signed)
1. Stop taking Losartan 

## 2019-03-13 NOTE — Progress Notes (Signed)
Patient: Renee Carpenter Female    DOB: February 05, 1926   85 y.o.   MRN: [id number] Visit Date: 03/13/2019  Today's Provider: [name], MD   Chief Complaint  Patient presents with  . Follow-up   Subjective:     HPI   Tooth abscess From 02/08/2019-Started antibiotics. feels much better. Pain in lower jaw From 02/08/2019-Noncardiac,nonexertional,no jaw claudication. Resolved.  Patient states she has RUQ-bulging and uncomfortable feeling with certain movements.   Also patient is having some lightheadedness when she stands up. Short,resolves.  Overall she is feeling better.     Allergies  Allergen Reactions  . Acetaminophen Itching and Other (See Comments)    Stomach upset  . Aspirin Itching and Other (See Comments)    Stomach pain  . Metformin And Related     nausea  . Other Other (See Comments)    Novocaine, strange feeling  . Sulfa Antibiotics Other (See Comments)  . Tizanidine     Diarrhea, almost passed out, felt dizzy and confused  . Caffeine Palpitations  . Ginger Palpitations  . Ibuprofen Rash     Current Outpatient Medications:  .  acetaminophen (TYLENOL) 500 MG tablet, Take 500 mg by mouth every 6 (six) hours as needed., Disp: , Rfl:  .  ALPHA LIPOIC ACID PO, Take by mouth daily., Disp: , Rfl:  .  aspirin EC 81 MG tablet, Take 81 mg by mouth daily. , Disp: , Rfl:  .  blood glucose meter kit and supplies KIT, Dispense based on patient and insurance preference. Use up to four times daily as directed. (FOR ICD-9 250.00, 250.01)., Disp: 1 each, Rfl: 0 .  Coenzyme Q10 (CO Q10) 200 MG CAPS, Take by mouth daily. , Disp: , Rfl:  .  DULoxetine (CYMBALTA) 20 MG capsule, Take 1 capsule (20 mg total) by mouth daily. (Patient taking differently: Take 20 mg by mouth daily. ), Disp: 30 capsule, Rfl: 3 .  esomeprazole (NEXIUM) 40 MG capsule, TAKE 1 CAPSULE BY MOUTH EVERY DAY, Disp: 90 capsule, Rfl: 5 .  gabapentin (NEURONTIN) 600 MG tablet, Take 1 tablet  (600 mg total) by mouth 3 (three) times daily., Disp: 270 tablet, Rfl: 3 .  glimepiride (AMARYL) 4 MG tablet, TAKE 1 TABLET(4 MG) BY MOUTH DAILY BEFORE BREAKFAST, Disp: 30 tablet, Rfl: 12 .  glucose blood (ACCU-CHEK AVIVA PLUS) test strip, Check blood sugar once daily, fasting Use as instructed, Disp: 100 each, Rfl: 12 .  hydroxypropyl methylcellulose (ISOPTO TEARS) 2.5 % ophthalmic solution, Place 1 drop into both eyes 3 (three) times daily as needed. Dry eyes , Disp: , Rfl:  .  Lancets (ACCU-CHEK SOFT TOUCH) lancets, Use as instructed, Disp: 100 each, Rfl: 12 .  losartan (COZAAR) 25 MG tablet, TAKE 1 TABLET BY MOUTH EVERY DAY, Disp: 90 tablet, Rfl: 3 .  MAGNESIUM-OXIDE 400 (241.3 Mg) MG tablet, TAKE 1 TABLET BY MOUTH TWICE DAILY, Disp: 60 tablet, Rfl: 11 .  meloxicam (MOBIC) 7.5 MG tablet, Take 1 tablet (7.5 mg total) by mouth daily., Disp: 30 tablet, Rfl: 1 .  MULTIPLE VITAMIN PO, Take by mouth., Disp: , Rfl:  .  pregabalin (LYRICA) 25 MG capsule, Take 1 capsule (25 mg total) by mouth daily., Disp: 30 capsule, Rfl: 5 .  rOPINIRole (REQUIP) 1 MG tablet, Take 1 mg by mouth. 1/2 tablet four times daily., Disp: , Rfl:  .  senna-docusate (SENOKOT-S) 8.6-50 MG tablet, Take 1 tablet by mouth daily as needed., Disp: , Rfl:  .  tamsulosin (FLOMAX) 0.4 MG CAPS capsule, TAKE 1 CAPSULE(0.4 MG) BY MOUTH DAILY, Disp: 90 capsule, Rfl: 3 .  albuterol (VENTOLIN HFA) 108 (90 Base) MCG/ACT inhaler, Inhale 2 puffs into the lungs every 4 (four) hours as needed for wheezing or shortness of breath. (Patient not taking: Reported on 03/13/2019), Disp: 8 g, Rfl: 11 .  predniSONE (STERAPRED UNI-PAK 21 TAB) 10 MG (21) TBPK tablet, Take as per directed (Patient not taking: Reported on 03/13/2019), Disp: 21 tablet, Rfl: 0  Review of Systems  Constitutional: Negative for appetite change, chills, fatigue and fever.  HENT: Negative.   Eyes: Negative.   Respiratory: Negative for chest tightness and shortness of breath.    Cardiovascular: Negative for chest pain and palpitations.  Gastrointestinal: Negative for abdominal pain, nausea and vomiting.  Endocrine: Negative.   Musculoskeletal: Positive for back pain and neck pain.  Allergic/Immunologic: Negative.   Neurological: Negative for dizziness and weakness.  Psychiatric/Behavioral: Negative.     Social History   Tobacco Use  . Smoking status: Never Smoker  . Smokeless tobacco: Never Used  Substance Use Topics  . Alcohol use: No      Objective:   BP (!) 102/58 (BP Location: Left Arm, Patient Position: Sitting, Cuff Size: Large)   Pulse 92   Temp (!) 96.6 F (35.9 C) (Other (Comment))   Resp 16   Ht '5\' 5"'$  (1.651 m)   Wt 180 lb (81.6 kg)   SpO2 97%   BMI 29.95 kg/m  Vitals:   03/13/19 1338  BP: (!) 102/58  Pulse: 92  Resp: 16  Temp: (!) 96.6 F (35.9 C)  TempSrc: Other (Comment)  SpO2: 97%  Weight: 180 lb (81.6 kg)  Height: '5\' 5"'$  (1.651 m)  Body mass index is 29.95 kg/m.   Physical Exam Vitals signs reviewed.  HENT:     Head: Normocephalic and atraumatic.     Right Ear: External ear normal.     Left Ear: External ear normal.  Eyes:     General: No scleral icterus.    Conjunctiva/sclera: Conjunctivae normal.  Neck:     Musculoskeletal: No muscular tenderness.     Vascular: No carotid bruit.  Cardiovascular:     Rate and Rhythm: Normal rate and regular rhythm.     Heart sounds: Normal heart sounds.  Pulmonary:     Breath sounds: Normal breath sounds.  Abdominal:     Palpations: Abdomen is soft. There is no mass.     Comments: Normal abdominal exam.  Musculoskeletal:     Comments: Signifcant thoracic kyphosis.  Lymphadenopathy:     Cervical: No cervical adenopathy.  Skin:    General: Skin is warm and dry.  Neurological:     General: No focal deficit present.     Mental Status: She is alert and oriented to person, place, and time.  Psychiatric:        Mood and Affect: Mood normal.        Behavior: Behavior normal.         Thought Content: Thought content normal.        Judgment: Judgment normal.      No results found for any visits on 03/13/19.     Assessment & Plan    1. Need for influenza vaccination  - Flu Vaccine QUAD High Dose(Fluad)  2. Hypotension due to drugs Stop losartan and RTC 2-3 months. More than 50% 25 minute visit spent in counseling or coordination of care  3. Chronic venous insufficiency  4. Chronic systolic CHF (congestive heart failure), NYHA class 3 ([place]) Watch off of losartan.  5. Diabetes mellitus due to underlying condition with diabetic nephropathy, without long-term current use of insulin (HCC) Needs A1C.  6. Parkinsonian features   7. Jaw pain Resolved.     Richard Cranford Mon, MD  [facility]

## 2019-03-13 NOTE — Progress Notes (Signed)
MRN : [id number]  Renee Carpenter is a 85 y.o. (April 27, 1926) female who presents with chief complaint of  Chief Complaint  Patient presents with   New Patient (Initial Visit)    ref [name] bil le swelling   .  History of Present Illness:   The patient is seen for evaluation of painful lower extremities. Patient notes the pain is variable and not always associated with activity.  The pain is somewhat consistent day to day occurring on most days. The patient notes the pain also occurs with standing and routinely seems worse as the day wears on. The pain has been progressive over the past several years. The patient states these symptoms are causing  a profound negative impact on quality of life and daily activities.  The patient denies rest pain or dangling of an extremity off the side of the bed during the night for relief. No open wounds or sores at this time. No history of DVT or phlebitis. No prior interventions or surgeries.  There is a  history of back problems and DJD of the lumbar and sacral spine.   ABI Rt=1.18 (triphasic) and LT=0.99 (biphasic to triphasic)  Current Meds  Medication Sig   ALPHA LIPOIC ACID PO Take by mouth daily.   aspirin EC 81 MG tablet Take 81 mg by mouth daily.    Coenzyme Q10 (CO Q10) 200 MG CAPS Take by mouth daily.    DULoxetine (CYMBALTA) 20 MG capsule Take 1 capsule (20 mg total) by mouth daily. (Patient taking differently: Take 20 mg by mouth daily. )   esomeprazole (NEXIUM) 40 MG capsule TAKE 1 CAPSULE BY MOUTH EVERY DAY   gabapentin (NEURONTIN) 600 MG tablet Take 1 tablet (600 mg total) by mouth 3 (three) times daily.   glimepiride (AMARYL) 4 MG tablet TAKE 1 TABLET(4 MG) BY MOUTH DAILY BEFORE BREAKFAST   losartan (COZAAR) 25 MG tablet TAKE 1 TABLET BY MOUTH EVERY DAY   MAGNESIUM-OXIDE 400 (241.3 Mg) MG tablet TAKE 1 TABLET BY MOUTH TWICE DAILY   MULTIPLE VITAMIN PO Take by mouth.   rOPINIRole (REQUIP) 1 MG tablet Take 1 mg by  mouth. 1/2 tablet four times daily.   tamsulosin (FLOMAX) 0.4 MG CAPS capsule TAKE 1 CAPSULE(0.4 MG) BY MOUTH DAILY    Past Medical History:  Diagnosis Date   Anemia    WAS ANEMIC   Arthritis    Blood transfusion    HAD 1 SEVERAL YRS AGO...3-4 YR   Chronic kidney disease    SOME DECREASE IN KIDNEY FUNCTION   Diabetes mellitus without complication (HCC)    Hypertension    Normal cardiac stress test    REQUESTING COPY FROM [facility]   Normal echocardiogram    REQUESTING THAT RESULT -- DR KAHN    PONV (postoperative nausea and vomiting)    Shortness of breath    Spondylolysis 1998    Past Surgical History:  Procedure Laterality Date   ABDOMINAL HYSTERECTOMY     BACK SURGERY     2 CERVICAL SURGERIES ALSO   CATARACTS     RIGHT EYE CATARACT   CERVICAL DISCECTOMY  08/1995 and 2011   C4-,C5-6, C6-7 anterior cervical; Dr. Danielle Dess fusion and plating   LAMINECTOMY     decompressive; Dr. Polo Riley in [facility]   LUMBAR LAMINECTOMY/DECOMPRESSION MICRODISCECTOMY  04/14/2011   Procedure: LUMBAR LAMINECTOMY/DECOMPRESSION MICRODISCECTOMY;  Surgeon: Cristi Loron;  Location: [facility];  Service: Neurosurgery;  Laterality: N/A;  Lumbar Three and Lumbar Four  Laminectomy   TONSILLECTOMY AND ADENOIDECTOMY  1939    Social History Social History   Tobacco Use   Smoking status: Never Smoker   Smokeless tobacco: Never Used  Substance Use Topics   Alcohol use: No   Drug use: No    Family History Family History  Problem Relation Age of Onset   Heart failure Father    Heart disease Father    Hypertension Mother    Diabetes Mother    Heart disease Mother    Cancer Sister        breast   Breast cancer Sister [phone]   Cancer Maternal Grandfather    Cancer Maternal Aunt        x3   Breast cancer Daughter [phone]  No family history of bleeding/clotting disorders, porphyria or autoimmune disease   Allergies  Allergen Reactions   Acetaminophen Itching  and Other (See Comments)    Stomach upset   Aspirin Itching and Other (See Comments)    Stomach pain   Metformin And Related     nausea   Other Other (See Comments)    Novocaine, strange feeling   Sulfa Antibiotics Other (See Comments)   Tizanidine     Diarrhea, almost passed out, felt dizzy and confused   Caffeine Palpitations   Ginger Palpitations   Ibuprofen Rash     REVIEW OF SYSTEMS (Negative unless checked)  Constitutional: [] Weight loss  [] Fever  [] Chills Cardiac: [] Chest pain   [] Chest pressure   [] Palpitations   [] Shortness of breath when laying flat   [] Shortness of breath with exertion. Vascular:  [x] Pain in legs with walking   [x] Pain in legs at rest  [] History of DVT   [] Phlebitis   [x] Swelling in legs   [] Varicose veins   [] Non-healing ulcers Pulmonary:   [] Uses home oxygen   [] Productive cough   [] Hemoptysis   [] Wheeze  [x] COPD   [] Asthma Neurologic:  [] Dizziness   [] Seizures   [] History of stroke   [] History of TIA  [] Aphasia   [] Vissual changes   [] Weakness or numbness in arm   [] Weakness or numbness in leg Musculoskeletal:   [] Joint swelling   [x] Joint pain   [x] Low back pain Hematologic:  [] Easy bruising  [] Easy bleeding   [] Hypercoagulable state   [] Anemic Gastrointestinal:  [] Diarrhea   [] Vomiting  [x] Gastroesophageal reflux/heartburn   [] Difficulty swallowing. Genitourinary:  [] Chronic kidney disease   [] Difficult urination  [] Frequent urination   [] Blood in urine Skin:  [] Rashes   [] Ulcers  Psychological:  [] History of anxiety   []  History of major depression.  Physical Examination  Vitals:   03/08/19 1552  BP: 132/73  Pulse: 85  Resp: 16  Weight: 184 lb (83.5 kg)   Body mass index is 30.62 kg/m. Gen: WD/WN, NAD Head: [place]/AT, No temporalis wasting.  Ear/Nose/Throat: Hearing grossly intact, nares w/o erythema or drainage, poor dentition Eyes: PER, EOMI, sclera nonicteric.  Neck: Supple, no masses.  No bruit or JVD.  Pulmonary:  Good air  movement, clear to auscultation bilaterally, no use of accessory muscles.  Cardiac: RRR, normal S1, S2, no Murmurs. Vascular: scattered varicosities present bilaterally.  Mild venous stasis changes to the legs bilaterally.  2+ soft pitting edema on the right and 3-4+ pitting edema of the left Vessel Right Left  Radial Palpable Palpable  PT Palpable Trace Palpable  DP Palpable Trace Palpable   Gastrointestinal: soft, non-distended. No guarding/no peritoneal signs.  Musculoskeletal: M/S 5/5 throughout.  No deformity or atrophy.  Neurologic: CN 2-12  intact. Pain and light touch intact in extremities.  Symmetrical.  Speech is fluent. Motor exam as listed above. Psychiatric: Judgment intact, Mood & affect appropriate for pt's clinical situation. Dermatologic: mild venous rashes no ulcers noted.  No changes consistent with cellulitis. Lymph : No Cervical lymphadenopathy, no lichenification or skin changes of chronic lymphedema.  CBC Lab Results  Component Value Date   WBC 9.6 01/24/2019   HGB 10.3 (L) 01/24/2019   HCT 31.0 (L) 01/24/2019   MCV 114 (H) 01/24/2019   PLT 258 01/24/2019    BMET    Component Value Date/Time   NA 142 01/24/2019 1521   NA 139 02/06/2014 1958   K 5.0 01/24/2019 1521   K 3.9 02/06/2014 1958   CL 102 01/24/2019 1521   CL 103 02/06/2014 1958   CO2 26 01/24/2019 1521   CO2 30 02/06/2014 1958   GLUCOSE 170 (H) 01/24/2019 1521   GLUCOSE 123 (H) 02/06/2014 1958   BUN 21 01/24/2019 1521   BUN 20 (H) 02/06/2014 1958   CREATININE 1.59 (H) 01/24/2019 1521   CREATININE 1.12 02/06/2014 1958   CALCIUM 9.2 01/24/2019 1521   CALCIUM 9.8 02/06/2014 1958   GFRNONAA 28 (L) 01/24/2019 1521   GFRNONAA 44 (L) 02/06/2014 1958   GFRAA 32 (L) 01/24/2019 1521   GFRAA 52 (L) 02/06/2014 1958   CrCl cannot be calculated (Patient's most recent lab result is older than the maximum 21 days allowed.).  COAG No results found for: INR, PROTIME  Radiology Dexascan  Result  Date: 02/27/2019 EXAM: DUAL X-RAY ABSORPTIOMETRY (DXA) FOR BONE MINERAL DENSITY IMPRESSION: Technologist:VLM Your patient Graysen Depaula completed a BMD test on 02/27/2019 using the Lunar iDXA DXA System (analysis version: 14.10) manufactured by [employer]. The following summarizes the results of our evaluation. PATIENT BIOGRAPHICAL: Name: Angely, Dietz Patient ID: [id number] Birth Date: 08-22-1925 Height: 64.0 in. Gender: Female Exam Date: 02/27/2019 Weight: 182.8 lbs. Indications: Advanced Age, Caucasian, COPD, Diabetic Fractures: Treatments: Multi-Vitamin ASSESSMENT: The BMD measured at Forearm Radius 33% is 0.893 g/cm2 with a T-score of 0.2. This patient is considered normal according to World Health Organization [facility]) criteria. Lumbar spine was not utilized due to advanced degenerative changes and surgical hardware. The scan quality is good. Site Region Measured Measured WHO Young Adult BMD Date       Age      Classification T-score DualFemur Neck Right 02/27/2019 91.1 Normal 0.9 1.164 g/cm2 Left Forearm Radius 33% 02/27/2019 91.1 Normal 0.2 0.893 g/cm2 World Health Organization [facility]) criteria for post-menopausal, Caucasian Women: Normal:       T-score at or above -1 SD Osteopenia:   T-score between -1 and -2.5 SD Osteoporosis: T-score at or below -2.5 SD RECOMMENDATIONS: 1. All patients should optimize calcium and vitamin D intake. 2. Consider FDA-approved medical therapies in postmenopausal women and men aged 105 years and older, based on the following: a. A hip or vertebral(clinical or morphometric) fracture b. T-score < -2.5 at the femoral neck or spine after appropriate evaluation to exclude secondary causes c. Low bone mass (T-score between -1.0 and -2.5 at the femoral neck or spine) and a 10-year probability of a hip fracture > 3% or a 10-year probability of a major osteoporosis-related fracture > 20% based on the [place]-adapted WHO algorithm d. Clinician judgment and/or patient preferences may  indicate treatment for people with 10-year fracture probabilities above or below these levels FOLLOW-UP: People with diagnosed cases of osteoporosis or at high risk for fracture should have regular bone  mineral density tests. For patients eligible for Medicare, routine testing is allowed once every 2 years. The testing frequency can be increased to one year for patients who have rapidly progressing disease, those who are receiving or discontinuing medical therapy to restore bone mass, or have additional risk factors. I have reviewed this report, and agree with the above findings. [facility] Radiology Electronically Signed   By: Lowella Grip III M.D.   On: 02/27/2019 14:57   Vas [place] Burnard Bunting With/wo Tbi  Result Date: 03/08/2019 LOWER EXTREMITY DOPPLER STUDY Indications: Left leg pain and discoloration.  Performing Technologist: Charlane Ferretti RT (R)(VS)  Examination Guidelines: A complete evaluation includes at minimum, Doppler waveform signals and systolic blood pressure reading at the level of bilateral brachial, anterior tibial, and posterior tibial arteries, when vessel segments are accessible. Bilateral testing is considered an integral part of a complete examination. Photoelectric Plethysmograph (PPG) waveforms and toe systolic pressure readings are included as required and additional duplex testing as needed. Limited examinations for reoccurring indications may be performed as noted.  ABI Findings: +---------+------------------+-----+---------+--------+  Right     Rt Pressure (mmHg) Index Waveform  Comment   +---------+------------------+-----+---------+--------+  Brachial  157                                          +---------+------------------+-----+---------+--------+  ATA       194                1.24  triphasic           +---------+------------------+-----+---------+--------+  PTA       185                1.18  triphasic           +---------+------------------+-----+---------+--------+  Great  Toe 119                0.76  Normal              +---------+------------------+-----+---------+--------+ +---------+------------------+-----+----------+-------+  Left      Lt Pressure (mmHg) Index Waveform   Comment  +---------+------------------+-----+----------+-------+  Brachial  155                                          +---------+------------------+-----+----------+-------+  ATA       147                0.94  monophasic          +---------+------------------+-----+----------+-------+  PTA       155                0.99  biphasic            +---------+------------------+-----+----------+-------+  Great Toe 97                 0.62  Normal              +---------+------------------+-----+----------+-------+  Summary: Right: Resting right ankle-brachial index is within normal range. No evidence of significant right lower extremity arterial disease. The right toe-brachial index is normal. Left: The left toe-brachial index is abnormal.  *See table(s) above for measurements and observations.  Electronically signed by Hortencia Pilar MD on 03/08/2019 at 4:55:32 PM.   Final      Assessment/Plan 1. Chronic venous  insufficiency I have had a long discussion with the patient regarding swelling and why it  causes symptoms.  Patient will begin wearing graduated compression stockings class 1 (20-30 mmHg) on a daily basis a prescription was given. The patient will  beginning wearing the stockings first thing in the morning and removing them in the evening. The patient is instructed specifically not to sleep in the stockings.   In addition, behavioral modification will be initiated.  This will include frequent elevation, use of over the counter pain medications and exercise such as walking.  I have reviewed systemic causes for chronic edema such as liver, kidney and cardiac etiologies.  The patient denies problems with these organ systems.    Consideration for a lymph pump will also be made based upon the  effectiveness of conservative therapy.  This would help to improve the edema control and prevent sequela such as ulcers and infections   Patient should undergo duplex ultrasound of the venous system to ensure that DVT or reflux is not present.  The patient will follow-up with me after the ultrasound.    A total of 50 minutes was spent with this patient and greater than 50% was spent in counseling and coordination of care with the patient.  Discussion included the treatment options for vascular disease including indications for surgery and intervention.  Also discussed is the appropriate timing of treatment.  In addition medical therapy was discussed.  - VAS [place] LOWER EXTREMITY VENOUS (DVT); Future  2. Lymphedema I have had a long discussion with the patient regarding swelling and why it  causes symptoms.  Patient will begin wearing graduated compression stockings class 1 (20-30 mmHg) on a daily basis a prescription was given. The patient will  beginning wearing the stockings first thing in the morning and removing them in the evening. The patient is instructed specifically not to sleep in the stockings.   In addition, behavioral modification will be initiated.  This will include frequent elevation, use of over the counter pain medications and exercise such as walking.  I have reviewed systemic causes for chronic edema such as liver, kidney and cardiac etiologies.  The patient denies problems with these organ systems.    Consideration for a lymph pump will also be made based upon the effectiveness of conservative therapy.  This would help to improve the edema control and prevent sequela such as ulcers and infections   Patient should undergo duplex ultrasound of the venous system to ensure that DVT or reflux is not present.  The patient will follow-up with me after the ultrasound.   - VAS [place] LOWER EXTREMITY VENOUS (DVT); Future  3. Essential hypertension Continue antihypertensive medications  as already ordered, these medications have been reviewed and there are no changes at this time.   4. COPD, mild (HCC) Continue pulmonary medications and aerosols as already ordered, these medications have been reviewed and there are no changes at this time.    5. Type 2 diabetes mellitus with diabetic polyneuropathy, without long-term current use of insulin (HCC) Continue hypoglycemic medications as already ordered, these medications have been reviewed and there are no changes at this time.  Hgb A1C to be monitored as already arranged by primary service     Levora Dredge, MD  03/13/2019 9:41 AM

## 2019-04-11 ENCOUNTER — Ambulatory Visit (INDEPENDENT_AMBULATORY_CARE_PROVIDER_SITE_OTHER): Payer: Medicare Other

## 2019-04-11 ENCOUNTER — Ambulatory Visit (INDEPENDENT_AMBULATORY_CARE_PROVIDER_SITE_OTHER): Payer: Medicare Other | Admitting: Nurse Practitioner

## 2019-04-11 ENCOUNTER — Other Ambulatory Visit: Payer: Self-pay

## 2019-04-11 ENCOUNTER — Encounter (INDEPENDENT_AMBULATORY_CARE_PROVIDER_SITE_OTHER): Payer: Self-pay | Admitting: Nurse Practitioner

## 2019-04-11 VITALS — BP 142/71 | HR 91 | Resp 16

## 2019-04-11 DIAGNOSIS — M8949 Other hypertrophic osteoarthropathy, multiple sites: Secondary | ICD-10-CM | POA: Diagnosis not present

## 2019-04-11 DIAGNOSIS — I890 Lymphedema, not elsewhere classified: Secondary | ICD-10-CM | POA: Diagnosis not present

## 2019-04-11 DIAGNOSIS — I872 Venous insufficiency (chronic) (peripheral): Secondary | ICD-10-CM

## 2019-04-11 DIAGNOSIS — I10 Essential (primary) hypertension: Secondary | ICD-10-CM | POA: Diagnosis not present

## 2019-04-11 DIAGNOSIS — M159 Polyosteoarthritis, unspecified: Secondary | ICD-10-CM

## 2019-04-15 ENCOUNTER — Encounter (INDEPENDENT_AMBULATORY_CARE_PROVIDER_SITE_OTHER): Payer: Self-pay | Admitting: Nurse Practitioner

## 2019-04-15 NOTE — Progress Notes (Addendum)
SUBJECTIVE:  Patient ID: Renee Carpenter, female    DOB: Nov 27, 1925, 85 y.o.   MRN: [id number] Chief Complaint  Patient presents with   Follow-up    ultrasound follow up    HPI  Renee Carpenter is a 85 y.o. female The patient returns to the office for followup evaluation regarding leg swelling.  The swelling has persisted and the pain associated with swelling continues. There have not been any interval development of a ulcerations or wounds.  Since the previous visit the patient has been wearing graduated compression stockings and has noted little if any improvement in the lymphedema. The patient has been using compression routinely morning until night.  The patient also states elevation during the day and exercise is being done too.  Noninvasive studies today show no evidence of DVT or superficial venous thrombosis. Past Medical History:  Diagnosis Date   Anemia    WAS ANEMIC   Arthritis    Blood transfusion    HAD 1 SEVERAL YRS AGO...3-4 YR   Chronic kidney disease    SOME DECREASE IN KIDNEY FUNCTION   Diabetes mellitus without complication (HCC)    Hypertension    Normal cardiac stress test    REQUESTING COPY FROM [facility]   Normal echocardiogram    REQUESTING THAT RESULT -- DR KAHN    PONV (postoperative nausea and vomiting)    Shortness of breath    Spondylolysis 1998    Past Surgical History:  Procedure Laterality Date   ABDOMINAL HYSTERECTOMY     BACK SURGERY     2 CERVICAL SURGERIES ALSO   CATARACTS     RIGHT EYE CATARACT   CERVICAL DISCECTOMY  08/1995 and 2011   C4-,C5-6, C6-7 anterior cervical; Dr. Ellene Route fusion and plating   LAMINECTOMY     decompressive; Dr. Burman Riis in [place] MICRODISCECTOMY  04/14/2011   Procedure: LUMBAR LAMINECTOMY/DECOMPRESSION MICRODISCECTOMY;  Surgeon: [name];  Location: [facility];  Service: Neurosurgery;  Laterality: N/A;  Lumbar Three and Lumbar Four  Laminectomy   TONSILLECTOMY AND ADENOIDECTOMY  1939    Social History   Socioeconomic History   Marital status: Widowed    Spouse name: Not on file   Number of children: 1   Years of education: Not on file   Highest education level: Bachelor's degree (e.g., BA, AB, BS)  Occupational History   Occupation: retired  Scientist, product/process development strain: Not hard at [employer]    Worry: Never true    Inability: Never true   Transportation needs    Medical: No    Non-medical: No  Tobacco Use   Smoking status: Never Smoker   Smokeless tobacco: Never Used  Substance and Sexual Activity   Alcohol use: No   Drug use: No   Sexual activity: Never  Lifestyle   Physical activity    Days per week: 0 days    Minutes per session: 0 min   Stress: To some extent  Relationships   Social connections    Talks on phone: Patient refused    Gets together: Patient refused    Attends religious service: Patient refused    Active member of club or organization: Patient refused    Attends meetings of clubs or organizations: Patient refused    Relationship status: Patient refused   Intimate partner violence    Fear of current or ex partner: Patient refused    Emotionally abused: Patient refused  Physically abused: Patient refused    Forced sexual activity: Patient refused  Other Topics Concern   Not on file  Social History Narrative   Not on file    Family History  Problem Relation Age of Onset   Heart failure Father    Heart disease Father    Hypertension Mother    Diabetes Mother    Heart disease Mother    Cancer Sister        breast   Breast cancer Sister 16   Cancer Maternal Grandfather    Cancer Maternal Aunt        x3   Breast cancer Daughter 50    Allergies  Allergen Reactions   Acetaminophen Itching and Other (See Comments)    Stomach upset   Aspirin Itching and Other (See Comments)    Stomach pain   Metformin And  Related     nausea   Other Other (See Comments)    Novocaine, strange feeling   Sulfa Antibiotics Other (See Comments)   Tizanidine     Diarrhea, almost passed out, felt dizzy and confused   Caffeine Palpitations   Ginger Palpitations   Ibuprofen Rash     Review of Systems   Review of Systems: Negative Unless Checked Constitutional: '[]'$ Weight loss  '[]'$ Fever  '[]'$ Chills Cardiac: '[]'$ Chest pain   '[]'$  Atrial Fibrillation  '[]'$ Palpitations   '[]'$ Shortness of breath when laying flat   '[]'$ Shortness of breath with exertion. '[]'$ Shortness of breath at rest Vascular:  '[]'$ Pain in legs with walking   '[]'$ Pain in legs with standing '[]'$ Pain in legs when laying flat   '[]'$ Claudication    '[]'$ Pain in feet when laying flat    '[]'$ History of DVT   '[]'$ Phlebitis   '[x]'$ Swelling in legs   '[x]'$ Varicose veins   '[]'$ Non-healing ulcers Pulmonary:   '[]'$ Uses home oxygen   '[]'$ Productive cough   '[]'$ Hemoptysis   '[]'$ Wheeze  '[]'$ COPD   '[]'$ Asthma Neurologic:  '[]'$ Dizziness   '[]'$ Seizures  '[]'$ Blackouts '[]'$ History of stroke   '[]'$ History of TIA  '[]'$ Aphasia   '[]'$ Temporary Blindness   '[]'$ Weakness or numbness in arm   '[]'$ Weakness or numbness in leg Musculoskeletal:   '[]'$ Joint swelling   '[]'$ Joint pain   '[]'$ Low back pain  '[]'$  History of Knee Replacement '[]'$ Arthritis '[]'$ back Surgeries  '[]'$  Spinal Stenosis    Hematologic:  '[]'$ Easy bruising  '[]'$ Easy bleeding   '[]'$ Hypercoagulable state   '[]'$ Anemic Gastrointestinal:  '[]'$ Diarrhea   '[]'$ Vomiting  '[x]'$ Gastroesophageal reflux/heartburn   '[]'$ Difficulty swallowing. '[]'$ Abdominal pain Genitourinary:  '[]'$ Chronic kidney disease   '[]'$ Difficult urination  '[]'$ Anuric   '[]'$ Blood in urine '[]'$ Frequent urination  '[]'$ Burning with urination   '[]'$ Hematuria Skin:  '[]'$ Rashes   '[]'$ Ulcers '[]'$ Wounds Psychological:  '[]'$ History of anxiety   '[]'$  History of major depression  '[]'$  Memory Difficulties      OBJECTIVE:   Physical Exam  BP (!) 142/71 (BP Location: Right Arm)    Pulse 91    Resp 16   Gen: WD/WN, NAD Head: [place]/AT, No temporalis wasting.  Ear/Nose/Throat: Hearing  grossly intact, nares w/o erythema or drainage Eyes: PER, EOMI, sclera nonicteric.  Neck: Supple, no masses.  No JVD.  Pulmonary:  Good air movement, no use of accessory muscles.  Cardiac: RRR Vascular: scattered varicosities present bilaterally.  Mild venous stasis changes to the legs bilaterally.  2+ soft pitting edema on right, 3+ on left  Vessel Right Left  Radial Palpable Palpable  Dorsalis Pedis Palpable Palpable  Posterior Tibial Palpable Palpable   Gastrointestinal: soft, non-distended. No guarding/no peritoneal signs.  Musculoskeletal: M/S 5/5 throughout.  No deformity or atrophy.  Neurologic: Pain and light touch intact in extremities.  Symmetrical.  Speech is fluent. Motor exam as listed above. Psychiatric: Judgment intact, Mood & affect appropriate for pt's clinical situation. Dermatologic:  Bilateral stasis dermatitis No changes consistent with cellulitis. Lymph : No Cervical lymphadenopathy, dermal thickening bilaterally      ASSESSMENT AND PLAN:  1. Lymphedema Recommend:  No surgery or intervention at this point in time.    I have reviewed my previous discussion with the patient regarding swelling and why it causes symptoms.  Patient will continue wearing graduated compression stockings class 1 (20-30 mmHg) on a daily basis. The patient will  beginning wearing the stockings first thing in the morning and removing them in the evening. The patient is instructed specifically not to sleep in the stockings.    In addition, behavioral modification including several periods of elevation of the lower extremities during the day will be continued.  This was reviewed with the patient during the initial visit.  The patient will also continue routine exercise, especially walking on a daily basis as was discussed during the initial visit.    Despite conservative treatments including graduated compression therapy class 1 and behavioral modification including exercise and elevation  the patient  has not obtained adequate control of the lymphedema.  The patient still has stage 3 lymphedema and therefore, I believe that a lymph pump should be added to improve the control of the patient's lymphedema.  Additionally, a lymph pump is warranted because it will reduce the risk of cellulitis and ulceration in the future.  Patient should follow-up in six months    2. Primary osteoarthritis involving multiple joints Continue NSAID medications as already ordered, these medications have been reviewed and there are no changes at this time.  Continued activity and therapy was stressed.   3. Essential hypertension Continue antihypertensive medications as already ordered, these medications have been reviewed and there are no changes at this time.    Current Outpatient Medications on File Prior to Visit  Medication Sig Dispense Refill   acetaminophen (TYLENOL) 500 MG tablet Take 500 mg by mouth every 6 (six) hours as needed.     ALPHA LIPOIC ACID PO Take by mouth daily.     aspirin EC 81 MG tablet Take 81 mg by mouth daily.      blood glucose meter kit and supplies KIT Dispense based on patient and insurance preference. Use up to four times daily as directed. (FOR ICD-9 250.00, 250.01). 1 each 0   Coenzyme Q10 (CO Q10) 200 MG CAPS Take by mouth daily.      DULoxetine (CYMBALTA) 20 MG capsule Take 1 capsule (20 mg total) by mouth daily. (Patient taking differently: Take 20 mg by mouth daily. ) 30 capsule 3   esomeprazole (NEXIUM) 40 MG capsule TAKE 1 CAPSULE BY MOUTH EVERY DAY 90 capsule 5   gabapentin (NEURONTIN) 600 MG tablet Take 1 tablet (600 mg total) by mouth 3 (three) times daily. 270 tablet 3   glimepiride (AMARYL) 4 MG tablet TAKE 1 TABLET(4 MG) BY MOUTH DAILY BEFORE BREAKFAST 30 tablet 12   glucose blood (ACCU-CHEK AVIVA PLUS) test strip Check blood sugar once daily, fasting Use as instructed 100 each 12   hydroxypropyl methylcellulose (ISOPTO TEARS) 2.5 %  ophthalmic solution Place 1 drop into both eyes 3 (three) times daily as needed. Dry eyes      Lancets (ACCU-CHEK SOFT TOUCH) lancets Use as instructed  100 each 12   MAGNESIUM-OXIDE 400 (241.3 Mg) MG tablet TAKE 1 TABLET BY MOUTH TWICE DAILY 60 tablet 11   meloxicam (MOBIC) 7.5 MG tablet Take 1 tablet (7.5 mg total) by mouth daily. 30 tablet 1   MULTIPLE VITAMIN PO Take by mouth.     pregabalin (LYRICA) 25 MG capsule Take 1 capsule (25 mg total) by mouth daily. 30 capsule 5   rOPINIRole (REQUIP) 1 MG tablet Take 1 mg by mouth. 1/2 tablet four times daily.     senna-docusate (SENOKOT-S) 8.6-50 MG tablet Take 1 tablet by mouth daily as needed.     tamsulosin (FLOMAX) 0.4 MG CAPS capsule TAKE 1 CAPSULE(0.4 MG) BY MOUTH DAILY 90 capsule 3   albuterol (VENTOLIN HFA) 108 (90 Base) MCG/ACT inhaler Inhale 2 puffs into the lungs every 4 (four) hours as needed for wheezing or shortness of breath. (Patient not taking: Reported on 03/13/2019) 8 g 11   losartan (COZAAR) 25 MG tablet TAKE 1 TABLET BY MOUTH EVERY DAY (Patient not taking: Reported on 04/11/2019) 90 tablet 3   predniSONE (STERAPRED UNI-PAK 21 TAB) 10 MG (21) TBPK tablet Take as per directed (Patient not taking: Reported on 03/13/2019) 21 tablet 0   No current facility-administered medications on file prior to visit.     There are no Patient Instructions on file for this visit. No follow-ups on file.   Kris Hartmann, NP  This note was completed with Sales executive.  Any errors are purely unintentional.

## 2019-06-07 NOTE — Progress Notes (Signed)
Patient: Renee Carpenter Female    DOB: 1925/12/11   85 y.o.   MRN: [id number] Visit Date: 06/13/2019  Today's Provider: [name], MD   Chief Complaint  Patient presents with  . Follow-up  . Diabetes   Subjective:     HPI  Everything is stable for pt.  Hypotension due to drugs From 03/13/2019-advised to stop losartan and RTC 2-3 months.  Chronic systolic CHF (congestive heart failure), NYHA class 3 ([place]) From 03/13/2019-Watch off of losartan.  Diabetes mellitus due to underlying condition with diabetic nephropathy, without long-term current use of insulin ([place]) From 03/13/2019-Needs A1C at next visit.  Pt says she wears support hose but lymphedema pump is uncomfortable.  Allergies  Allergen Reactions  . Acetaminophen Itching and Other (See Comments)    Stomach upset  . Aspirin Itching and Other (See Comments)    Stomach pain  . Metformin And Related     nausea  . Other Other (See Comments)    Novocaine, strange feeling  . Sulfa Antibiotics Other (See Comments)  . Tizanidine     Diarrhea, almost passed out, felt dizzy and confused  . Caffeine Palpitations  . Ginger Palpitations  . Ibuprofen Rash     Current Outpatient Medications:  .  acetaminophen (TYLENOL) 500 MG tablet, Take 500 mg by mouth every 6 (six) hours as needed., Disp: , Rfl:  .  ALPHA LIPOIC ACID PO, Take by mouth daily., Disp: , Rfl:  .  aspirin EC 81 MG tablet, Take 81 mg by mouth daily. , Disp: , Rfl:  .  blood glucose meter kit and supplies KIT, Dispense based on patient and insurance preference. Use up to four times daily as directed. (FOR ICD-9 250.00, 250.01)., Disp: 1 each, Rfl: 0 .  Coenzyme Q10 (CO Q10) 200 MG CAPS, Take by mouth daily. , Disp: , Rfl:  .  DULoxetine (CYMBALTA) 20 MG capsule, Take 1 capsule (20 mg total) by mouth daily. (Patient taking differently: Take 20 mg by mouth daily. ), Disp: 30 capsule, Rfl: 3 .  esomeprazole (NEXIUM) 40 MG capsule, TAKE 1  CAPSULE BY MOUTH EVERY DAY, Disp: 90 capsule, Rfl: 5 .  gabapentin (NEURONTIN) 600 MG tablet, Take 1 tablet (600 mg total) by mouth 3 (three) times daily., Disp: 270 tablet, Rfl: 3 .  glimepiride (AMARYL) 4 MG tablet, TAKE 1 TABLET(4 MG) BY MOUTH DAILY BEFORE BREAKFAST, Disp: 30 tablet, Rfl: 12 .  glucose blood (ACCU-CHEK AVIVA PLUS) test strip, Check blood sugar once daily, fasting Use as instructed, Disp: 100 each, Rfl: 12 .  hydroxypropyl methylcellulose (ISOPTO TEARS) 2.5 % ophthalmic solution, Place 1 drop into both eyes 3 (three) times daily as needed. Dry eyes , Disp: , Rfl:  .  Lancets (ACCU-CHEK SOFT TOUCH) lancets, Use as instructed, Disp: 100 each, Rfl: 12 .  MAGNESIUM-OXIDE 400 (241.3 Mg) MG tablet, TAKE 1 TABLET BY MOUTH TWICE DAILY, Disp: 60 tablet, Rfl: 11 .  meloxicam (MOBIC) 7.5 MG tablet, Take 1 tablet (7.5 mg total) by mouth daily., Disp: 30 tablet, Rfl: 1 .  MULTIPLE VITAMIN PO, Take by mouth., Disp: , Rfl:  .  pregabalin (LYRICA) 25 MG capsule, Take 1 capsule (25 mg total) by mouth daily., Disp: 30 capsule, Rfl: 5 .  rOPINIRole (REQUIP) 1 MG tablet, Take 1 mg by mouth. 1/2 tablet four times daily., Disp: , Rfl:  .  senna-docusate (SENOKOT-S) 8.6-50 MG tablet, Take 1 tablet by mouth daily as needed., Disp: ,  Rfl:  .  tamsulosin (FLOMAX) 0.4 MG CAPS capsule, TAKE 1 CAPSULE(0.4 MG) BY MOUTH DAILY, Disp: 90 capsule, Rfl: 3 .  albuterol (VENTOLIN HFA) 108 (90 Base) MCG/ACT inhaler, Inhale 2 puffs into the lungs every 4 (four) hours as needed for wheezing or shortness of breath. (Patient not taking: Reported on 03/13/2019), Disp: 8 g, Rfl: 11 .  losartan (COZAAR) 25 MG tablet, TAKE 1 TABLET BY MOUTH EVERY DAY (Patient not taking: Reported on 04/11/2019), Disp: 90 tablet, Rfl: 3 .  predniSONE (STERAPRED UNI-PAK 21 TAB) 10 MG (21) TBPK tablet, Take as per directed (Patient not taking: Reported on 03/13/2019), Disp: 21 tablet, Rfl: 0  Review of Systems  Constitutional: Negative for  appetite change, chills, fatigue and fever.  HENT: Negative.   Eyes: Negative.   Respiratory: Negative for chest tightness and shortness of breath.   Cardiovascular: Negative for chest pain and palpitations.  Gastrointestinal: Negative.  Negative for abdominal pain, nausea and vomiting.  Endocrine: Negative.   Allergic/Immunologic: Negative.   Neurological: Negative for dizziness and weakness.  Hematological: Negative.   Psychiatric/Behavioral: Negative.     Social History   Tobacco Use  . Smoking status: Never Smoker  . Smokeless tobacco: Never Used  Substance Use Topics  . Alcohol use: No      Objective:   BP 117/71 (BP Location: Left Arm, Patient Position: Sitting, Cuff Size: Large)   Pulse 91   Temp (!) 97.1 F (36.2 C) (Other (Comment))   Resp 18   Ht _0  (1.651 m)   Wt 176 lb (79.8 kg)   SpO2 94%   BMI 29.29 kg/m  Vitals:   06/13/19 1416  BP: 117/71  Pulse: 91  Resp: 18  Temp: (!) 97.1 F (36.2 C)  TempSrc: Other (Comment)  SpO2: 94%  Weight: 176 lb (79.8 kg)  Height: _1  (1.651 m)  Body mass index is 29.29 kg/m.   Physical Exam Vitals reviewed.  HENT:     Head: Normocephalic and atraumatic.     Right Ear: External ear normal.     Left Ear: External ear normal.  Eyes:     General: No scleral icterus.    Conjunctiva/sclera: Conjunctivae normal.  Neck:     Vascular: No carotid bruit.  Cardiovascular:     Rate and Rhythm: Normal rate and regular rhythm.     Heart sounds: Normal heart sounds.  Pulmonary:     Breath sounds: Normal breath sounds.  Abdominal:     Palpations: Abdomen is soft. There is no mass.     Comments: Normal abdominal exam.  Musculoskeletal:     Cervical back: No muscular tenderness.     Right lower leg: Edema present.     Left lower leg: Edema present.     Comments: Signifcant thoracic kyphosis. Trace LE edema.  Lymphadenopathy:     Cervical: No cervical adenopathy.  Skin:    General: Skin is warm and dry.    Neurological:     General: No focal deficit present.     Mental Status: She is alert and oriented to person, place, and time.  Psychiatric:        Mood and Affect: Mood normal.        Behavior: Behavior normal.        Thought Content: Thought content normal.        Judgment: Judgment normal.      Results for orders placed or performed in visit on 06/13/19  POCT HgB A1C  Result Value Ref Range   Hemoglobin A1C 9.1 (A) 4.0 - 5.6 %   Est. average glucose Bld gHb Est-mCnc 214        Assessment & Plan    1. Controlled type 2 diabetes mellitus with complication, without long-term current use of insulin (HCC) More than 50% 25 minute visit spent in counseling or coordination of care  - POCT HgB A1C - Renal Function Panel  2. Neuropathy (HCC) Progressive. - CBC with Differential/Platelet - Renal Function Panel  3. Tingling  - Vitamin B12  4. Lymphedema Per vascular.  5. Essential hypertension   6. COPD, mild ([place])   7. Type 2 diabetes mellitus with diabetic polyneuropathy, without long-term current use of insulin (HCC)   8. Age-related osteoporosis without current pathological fracture   9. Stage 3 chronic kidney disease, unspecified whether stage 3a or 3b CKD Stop meloxicam.  10. Neuropathic pain   11. Chronic anemia Stable.     Richard Cranford Mon, MD  [facility]

## 2019-06-13 ENCOUNTER — Other Ambulatory Visit: Payer: Self-pay

## 2019-06-13 ENCOUNTER — Ambulatory Visit (INDEPENDENT_AMBULATORY_CARE_PROVIDER_SITE_OTHER): Payer: Medicare PPO | Admitting: Family Medicine

## 2019-06-13 ENCOUNTER — Encounter: Payer: Self-pay | Admitting: Family Medicine

## 2019-06-13 VITALS — BP 117/71 | HR 91 | Temp 97.1°F | Resp 18 | Ht 65.0 in | Wt 176.0 lb

## 2019-06-13 DIAGNOSIS — M792 Neuralgia and neuritis, unspecified: Secondary | ICD-10-CM

## 2019-06-13 DIAGNOSIS — E1142 Type 2 diabetes mellitus with diabetic polyneuropathy: Secondary | ICD-10-CM

## 2019-06-13 DIAGNOSIS — N183 Chronic kidney disease, stage 3 unspecified: Secondary | ICD-10-CM | POA: Diagnosis not present

## 2019-06-13 DIAGNOSIS — G629 Polyneuropathy, unspecified: Secondary | ICD-10-CM | POA: Diagnosis not present

## 2019-06-13 DIAGNOSIS — R202 Paresthesia of skin: Secondary | ICD-10-CM | POA: Diagnosis not present

## 2019-06-13 DIAGNOSIS — J449 Chronic obstructive pulmonary disease, unspecified: Secondary | ICD-10-CM | POA: Diagnosis not present

## 2019-06-13 DIAGNOSIS — I10 Essential (primary) hypertension: Secondary | ICD-10-CM

## 2019-06-13 DIAGNOSIS — E118 Type 2 diabetes mellitus with unspecified complications: Secondary | ICD-10-CM

## 2019-06-13 DIAGNOSIS — M810 Age-related osteoporosis without current pathological fracture: Secondary | ICD-10-CM | POA: Diagnosis not present

## 2019-06-13 DIAGNOSIS — D649 Anemia, unspecified: Secondary | ICD-10-CM

## 2019-06-13 DIAGNOSIS — I890 Lymphedema, not elsewhere classified: Secondary | ICD-10-CM | POA: Diagnosis not present

## 2019-06-13 LAB — POCT GLYCOSYLATED HEMOGLOBIN (HGB A1C)
Est. average glucose Bld gHb Est-mCnc: 214
Hemoglobin A1C: 9.1 % — AB (ref 4.0–5.6)

## 2019-06-13 NOTE — Patient Instructions (Signed)
Stop Meloxicam 

## 2019-06-14 LAB — CBC WITH DIFFERENTIAL/PLATELET
Basophils Absolute: 0.1 10*3/uL (ref 0.0–0.2)
Basos: 1 %
EOS (ABSOLUTE): 0.2 10*3/uL (ref 0.0–0.4)
Eos: 3 %
Hematocrit: 31.1 % — ABNORMAL LOW (ref 34.0–46.6)
Hemoglobin: 10.9 g/dL — ABNORMAL LOW (ref 11.1–15.9)
Immature Grans (Abs): 0.1 10*3/uL (ref 0.0–0.1)
Immature Granulocytes: 1 %
Lymphocytes Absolute: 3.3 10*3/uL — ABNORMAL HIGH (ref 0.7–3.1)
Lymphs: 43 %
MCH: 39.5 pg — ABNORMAL HIGH (ref 26.6–33.0)
MCHC: 35 g/dL (ref 31.5–35.7)
MCV: 113 fL — ABNORMAL HIGH (ref 79–97)
Monocytes Absolute: 0.8 10*3/uL (ref 0.1–0.9)
Monocytes: 10 %
Neutrophils Absolute: 3.2 10*3/uL (ref 1.4–7.0)
Neutrophils: 42 %
Platelets: 287 10*3/uL (ref 150–450)
RBC: 2.76 x10E6/uL — ABNORMAL LOW (ref 3.77–5.28)
RDW: 15.4 % (ref 11.7–15.4)
WBC: 7.6 10*3/uL (ref 3.4–10.8)

## 2019-06-14 LAB — RENAL FUNCTION PANEL
Albumin: 4.1 g/dL (ref 3.5–4.6)
BUN/Creatinine Ratio: 11 — ABNORMAL LOW (ref 12–28)
BUN: 15 mg/dL (ref 10–36)
CO2: 26 mmol/L (ref 20–29)
Calcium: 9.4 mg/dL (ref 8.7–10.3)
Chloride: 100 mmol/L (ref 96–106)
Creatinine, Ser: 1.35 mg/dL — ABNORMAL HIGH (ref 0.57–1.00)
GFR calc Af Amer: 40 mL/min/{1.73_m2} — ABNORMAL LOW (ref >59)
GFR calc non Af Amer: 34 mL/min/{1.73_m2} — ABNORMAL LOW (ref >59)
Glucose: 193 mg/dL — ABNORMAL HIGH (ref 65–99)
Phosphorus: 3.1 mg/dL (ref 3.0–4.3)
Potassium: 4.7 mmol/L (ref 3.5–5.2)
Sodium: 141 mmol/L (ref 134–144)

## 2019-06-14 LAB — VITAMIN B12: Vitamin B-12: 645 pg/mL (ref 232–1245)

## 2019-06-19 ENCOUNTER — Other Ambulatory Visit (INDEPENDENT_AMBULATORY_CARE_PROVIDER_SITE_OTHER): Payer: Self-pay | Admitting: Nurse Practitioner

## 2019-07-02 ENCOUNTER — Other Ambulatory Visit: Payer: Self-pay | Admitting: Family Medicine

## 2019-07-02 DIAGNOSIS — K2100 Gastro-esophageal reflux disease with esophagitis, without bleeding: Secondary | ICD-10-CM

## 2019-07-02 MED ORDER — ESOMEPRAZOLE MAGNESIUM 40 MG PO CPDR: DELAYED_RELEASE_CAPSULE | ORAL | 3 refills | Status: DC

## 2019-07-02 NOTE — Telephone Encounter (Signed)
[facility] faxed refill request for the following medications:  esomeprazole (NEXIUM) 40 MG capsule   Please advise.  Thanks, [employer]

## 2019-07-10 DIAGNOSIS — M48061 Spinal stenosis, lumbar region without neurogenic claudication: Secondary | ICD-10-CM | POA: Diagnosis not present

## 2019-07-10 DIAGNOSIS — R2689 Other abnormalities of gait and mobility: Secondary | ICD-10-CM | POA: Diagnosis not present

## 2019-07-10 DIAGNOSIS — M6281 Muscle weakness (generalized): Secondary | ICD-10-CM | POA: Diagnosis not present

## 2019-07-13 DIAGNOSIS — R2689 Other abnormalities of gait and mobility: Secondary | ICD-10-CM | POA: Diagnosis not present

## 2019-07-13 DIAGNOSIS — M48061 Spinal stenosis, lumbar region without neurogenic claudication: Secondary | ICD-10-CM | POA: Diagnosis not present

## 2019-07-13 DIAGNOSIS — M6281 Muscle weakness (generalized): Secondary | ICD-10-CM | POA: Diagnosis not present

## 2019-07-17 DIAGNOSIS — R2689 Other abnormalities of gait and mobility: Secondary | ICD-10-CM | POA: Diagnosis not present

## 2019-07-17 DIAGNOSIS — M48061 Spinal stenosis, lumbar region without neurogenic claudication: Secondary | ICD-10-CM | POA: Diagnosis not present

## 2019-07-17 DIAGNOSIS — M6281 Muscle weakness (generalized): Secondary | ICD-10-CM | POA: Diagnosis not present

## 2019-07-20 DIAGNOSIS — R2689 Other abnormalities of gait and mobility: Secondary | ICD-10-CM | POA: Diagnosis not present

## 2019-07-20 DIAGNOSIS — M6281 Muscle weakness (generalized): Secondary | ICD-10-CM | POA: Diagnosis not present

## 2019-07-20 DIAGNOSIS — M48061 Spinal stenosis, lumbar region without neurogenic claudication: Secondary | ICD-10-CM | POA: Diagnosis not present

## 2019-07-24 DIAGNOSIS — R2689 Other abnormalities of gait and mobility: Secondary | ICD-10-CM | POA: Diagnosis not present

## 2019-07-24 DIAGNOSIS — M48061 Spinal stenosis, lumbar region without neurogenic claudication: Secondary | ICD-10-CM | POA: Diagnosis not present

## 2019-07-24 DIAGNOSIS — M6281 Muscle weakness (generalized): Secondary | ICD-10-CM | POA: Diagnosis not present

## 2019-07-27 DIAGNOSIS — M6281 Muscle weakness (generalized): Secondary | ICD-10-CM | POA: Diagnosis not present

## 2019-07-27 DIAGNOSIS — R2689 Other abnormalities of gait and mobility: Secondary | ICD-10-CM | POA: Diagnosis not present

## 2019-07-27 DIAGNOSIS — M48061 Spinal stenosis, lumbar region without neurogenic claudication: Secondary | ICD-10-CM | POA: Diagnosis not present

## 2019-07-31 DIAGNOSIS — M48061 Spinal stenosis, lumbar region without neurogenic claudication: Secondary | ICD-10-CM | POA: Diagnosis not present

## 2019-07-31 DIAGNOSIS — M6281 Muscle weakness (generalized): Secondary | ICD-10-CM | POA: Diagnosis not present

## 2019-07-31 DIAGNOSIS — R2689 Other abnormalities of gait and mobility: Secondary | ICD-10-CM | POA: Diagnosis not present

## 2019-08-03 DIAGNOSIS — M48061 Spinal stenosis, lumbar region without neurogenic claudication: Secondary | ICD-10-CM | POA: Diagnosis not present

## 2019-08-03 DIAGNOSIS — R2689 Other abnormalities of gait and mobility: Secondary | ICD-10-CM | POA: Diagnosis not present

## 2019-08-03 DIAGNOSIS — M6281 Muscle weakness (generalized): Secondary | ICD-10-CM | POA: Diagnosis not present

## 2019-08-07 DIAGNOSIS — M6281 Muscle weakness (generalized): Secondary | ICD-10-CM | POA: Diagnosis not present

## 2019-08-07 DIAGNOSIS — R2689 Other abnormalities of gait and mobility: Secondary | ICD-10-CM | POA: Diagnosis not present

## 2019-08-07 DIAGNOSIS — M48061 Spinal stenosis, lumbar region without neurogenic claudication: Secondary | ICD-10-CM | POA: Diagnosis not present

## 2019-08-16 DIAGNOSIS — M48061 Spinal stenosis, lumbar region without neurogenic claudication: Secondary | ICD-10-CM | POA: Diagnosis not present

## 2019-08-16 DIAGNOSIS — R2689 Other abnormalities of gait and mobility: Secondary | ICD-10-CM | POA: Diagnosis not present

## 2019-08-16 DIAGNOSIS — M6281 Muscle weakness (generalized): Secondary | ICD-10-CM | POA: Diagnosis not present

## 2019-08-17 DIAGNOSIS — M48061 Spinal stenosis, lumbar region without neurogenic claudication: Secondary | ICD-10-CM | POA: Diagnosis not present

## 2019-08-17 DIAGNOSIS — R2689 Other abnormalities of gait and mobility: Secondary | ICD-10-CM | POA: Diagnosis not present

## 2019-08-17 DIAGNOSIS — M6281 Muscle weakness (generalized): Secondary | ICD-10-CM | POA: Diagnosis not present

## 2019-08-21 DIAGNOSIS — M6281 Muscle weakness (generalized): Secondary | ICD-10-CM | POA: Diagnosis not present

## 2019-08-21 DIAGNOSIS — R2689 Other abnormalities of gait and mobility: Secondary | ICD-10-CM | POA: Diagnosis not present

## 2019-08-21 DIAGNOSIS — M48061 Spinal stenosis, lumbar region without neurogenic claudication: Secondary | ICD-10-CM | POA: Diagnosis not present

## 2019-08-24 DIAGNOSIS — R2689 Other abnormalities of gait and mobility: Secondary | ICD-10-CM | POA: Diagnosis not present

## 2019-08-24 DIAGNOSIS — M6281 Muscle weakness (generalized): Secondary | ICD-10-CM | POA: Diagnosis not present

## 2019-08-24 DIAGNOSIS — M48061 Spinal stenosis, lumbar region without neurogenic claudication: Secondary | ICD-10-CM | POA: Diagnosis not present

## 2019-08-28 DIAGNOSIS — M48061 Spinal stenosis, lumbar region without neurogenic claudication: Secondary | ICD-10-CM | POA: Diagnosis not present

## 2019-08-28 DIAGNOSIS — M6281 Muscle weakness (generalized): Secondary | ICD-10-CM | POA: Diagnosis not present

## 2019-08-28 DIAGNOSIS — R2689 Other abnormalities of gait and mobility: Secondary | ICD-10-CM | POA: Diagnosis not present

## 2019-08-29 DIAGNOSIS — H903 Sensorineural hearing loss, bilateral: Secondary | ICD-10-CM | POA: Diagnosis not present

## 2019-08-30 DIAGNOSIS — M48061 Spinal stenosis, lumbar region without neurogenic claudication: Secondary | ICD-10-CM | POA: Diagnosis not present

## 2019-08-30 DIAGNOSIS — R2689 Other abnormalities of gait and mobility: Secondary | ICD-10-CM | POA: Diagnosis not present

## 2019-08-30 DIAGNOSIS — M6281 Muscle weakness (generalized): Secondary | ICD-10-CM | POA: Diagnosis not present

## 2019-09-04 DIAGNOSIS — R2689 Other abnormalities of gait and mobility: Secondary | ICD-10-CM | POA: Diagnosis not present

## 2019-09-04 DIAGNOSIS — M48061 Spinal stenosis, lumbar region without neurogenic claudication: Secondary | ICD-10-CM | POA: Diagnosis not present

## 2019-09-04 DIAGNOSIS — M6281 Muscle weakness (generalized): Secondary | ICD-10-CM | POA: Diagnosis not present

## 2019-09-07 DIAGNOSIS — M6281 Muscle weakness (generalized): Secondary | ICD-10-CM | POA: Diagnosis not present

## 2019-09-07 DIAGNOSIS — M48061 Spinal stenosis, lumbar region without neurogenic claudication: Secondary | ICD-10-CM | POA: Diagnosis not present

## 2019-09-07 DIAGNOSIS — R2689 Other abnormalities of gait and mobility: Secondary | ICD-10-CM | POA: Diagnosis not present

## 2019-09-11 DIAGNOSIS — M48061 Spinal stenosis, lumbar region without neurogenic claudication: Secondary | ICD-10-CM | POA: Diagnosis not present

## 2019-09-11 DIAGNOSIS — M6281 Muscle weakness (generalized): Secondary | ICD-10-CM | POA: Diagnosis not present

## 2019-09-11 DIAGNOSIS — R2689 Other abnormalities of gait and mobility: Secondary | ICD-10-CM | POA: Diagnosis not present

## 2019-09-18 DIAGNOSIS — L820 Inflamed seborrheic keratosis: Secondary | ICD-10-CM | POA: Diagnosis not present

## 2019-09-21 DIAGNOSIS — M48061 Spinal stenosis, lumbar region without neurogenic claudication: Secondary | ICD-10-CM | POA: Diagnosis not present

## 2019-09-21 DIAGNOSIS — M6281 Muscle weakness (generalized): Secondary | ICD-10-CM | POA: Diagnosis not present

## 2019-09-21 DIAGNOSIS — R2689 Other abnormalities of gait and mobility: Secondary | ICD-10-CM | POA: Diagnosis not present

## 2019-09-25 DIAGNOSIS — M48061 Spinal stenosis, lumbar region without neurogenic claudication: Secondary | ICD-10-CM | POA: Diagnosis not present

## 2019-09-25 DIAGNOSIS — R2689 Other abnormalities of gait and mobility: Secondary | ICD-10-CM | POA: Diagnosis not present

## 2019-09-25 DIAGNOSIS — M6281 Muscle weakness (generalized): Secondary | ICD-10-CM | POA: Diagnosis not present

## 2019-09-28 DIAGNOSIS — M6281 Muscle weakness (generalized): Secondary | ICD-10-CM | POA: Diagnosis not present

## 2019-09-28 DIAGNOSIS — M48061 Spinal stenosis, lumbar region without neurogenic claudication: Secondary | ICD-10-CM | POA: Diagnosis not present

## 2019-09-28 DIAGNOSIS — R2689 Other abnormalities of gait and mobility: Secondary | ICD-10-CM | POA: Diagnosis not present

## 2019-09-30 ENCOUNTER — Other Ambulatory Visit: Payer: Self-pay | Admitting: Family Medicine

## 2019-09-30 NOTE — Telephone Encounter (Signed)
Requested Prescriptions  Pending Prescriptions Disp Refills  . tamsulosin (FLOMAX) 0.4 MG CAPS capsule [Pharmacy Med Name: TAMSULOSIN 0.4MG  CAPSULES] 90 capsule 3    Sig: TAKE 1 CAPSULE(0.4 MG) BY MOUTH DAILY     Urology: Alpha-Adrenergic Blocker Passed - 09/30/2019  6:20 AM      Passed - Last BP in normal range    BP Readings from Last 1 Encounters:  06/13/19 117/71         Passed - Valid encounter within last 12 months    Recent Outpatient Visits          3 months ago Controlled type 2 diabetes mellitus with complication, without long-term current use of insulin [facility])   [facility] Maple Hudson., MD   6 months ago Need for influenza vaccination   [facility] Maple Hudson., MD   7 months ago Tooth abscess   [facility] Maple Hudson., MD   8 months ago Acute pain of right shoulder   [facility] Maple Hudson., MD   1 year ago Primary osteoarthritis involving multiple joints   [facility] Maple Hudson., MD      Future Appointments            In 1 week Maple Hudson., MD [facility]

## 2019-10-02 DIAGNOSIS — R2689 Other abnormalities of gait and mobility: Secondary | ICD-10-CM | POA: Diagnosis not present

## 2019-10-02 DIAGNOSIS — M6281 Muscle weakness (generalized): Secondary | ICD-10-CM | POA: Diagnosis not present

## 2019-10-02 DIAGNOSIS — M48061 Spinal stenosis, lumbar region without neurogenic claudication: Secondary | ICD-10-CM | POA: Diagnosis not present

## 2019-10-05 DIAGNOSIS — R2689 Other abnormalities of gait and mobility: Secondary | ICD-10-CM | POA: Diagnosis not present

## 2019-10-05 DIAGNOSIS — M6281 Muscle weakness (generalized): Secondary | ICD-10-CM | POA: Diagnosis not present

## 2019-10-05 DIAGNOSIS — M48061 Spinal stenosis, lumbar region without neurogenic claudication: Secondary | ICD-10-CM | POA: Diagnosis not present

## 2019-10-09 DIAGNOSIS — R2689 Other abnormalities of gait and mobility: Secondary | ICD-10-CM | POA: Diagnosis not present

## 2019-10-09 DIAGNOSIS — M48061 Spinal stenosis, lumbar region without neurogenic claudication: Secondary | ICD-10-CM | POA: Diagnosis not present

## 2019-10-09 DIAGNOSIS — M6281 Muscle weakness (generalized): Secondary | ICD-10-CM | POA: Diagnosis not present

## 2019-10-10 ENCOUNTER — Encounter: Payer: Self-pay | Admitting: Family Medicine

## 2019-10-10 ENCOUNTER — Ambulatory Visit: Payer: Medicare PPO | Admitting: Family Medicine

## 2019-10-10 ENCOUNTER — Other Ambulatory Visit: Payer: Self-pay

## 2019-10-10 VITALS — BP 128/77 | HR 87 | Temp 97.1°F | Wt 179.0 lb

## 2019-10-10 DIAGNOSIS — I872 Venous insufficiency (chronic) (peripheral): Secondary | ICD-10-CM | POA: Diagnosis not present

## 2019-10-10 DIAGNOSIS — E1142 Type 2 diabetes mellitus with diabetic polyneuropathy: Secondary | ICD-10-CM | POA: Diagnosis not present

## 2019-10-10 DIAGNOSIS — G629 Polyneuropathy, unspecified: Secondary | ICD-10-CM | POA: Diagnosis not present

## 2019-10-10 DIAGNOSIS — F329 Major depressive disorder, single episode, unspecified: Secondary | ICD-10-CM | POA: Diagnosis not present

## 2019-10-10 DIAGNOSIS — J449 Chronic obstructive pulmonary disease, unspecified: Secondary | ICD-10-CM

## 2019-10-10 DIAGNOSIS — R259 Unspecified abnormal involuntary movements: Secondary | ICD-10-CM | POA: Diagnosis not present

## 2019-10-10 DIAGNOSIS — F32A Depression, unspecified: Secondary | ICD-10-CM

## 2019-10-10 DIAGNOSIS — F411 Generalized anxiety disorder: Secondary | ICD-10-CM

## 2019-10-10 DIAGNOSIS — I10 Essential (primary) hypertension: Secondary | ICD-10-CM

## 2019-10-10 DIAGNOSIS — M48062 Spinal stenosis, lumbar region with neurogenic claudication: Secondary | ICD-10-CM

## 2019-10-10 DIAGNOSIS — G2581 Restless legs syndrome: Secondary | ICD-10-CM

## 2019-10-10 DIAGNOSIS — R29818 Other symptoms and signs involving the nervous system: Secondary | ICD-10-CM

## 2019-10-10 DIAGNOSIS — I5022 Chronic systolic (congestive) heart failure: Secondary | ICD-10-CM

## 2019-10-10 LAB — POCT GLYCOSYLATED HEMOGLOBIN (HGB A1C)
Estimated Average Glucose: 214
Hemoglobin A1C: 9.1 % — AB (ref 4.0–5.6)

## 2019-10-10 MED ORDER — DULOXETINE HCL 30 MG PO CPEP: 30.0000 mg | ORAL_CAPSULE | Freq: Every day | ORAL | 3 refills | Status: DC

## 2019-10-10 NOTE — Progress Notes (Signed)
Trena Platt Cummings,acting as a scribe for [name], MD.,have documented all relevant documentation on the behalf of [name], MD,as directed by  [name], MD while in the presence of [name], MD.  Established patient visit   Patient: Renee Carpenter   DOB: 15-Jun-1925   86 y.o. Female  MRN: [id number] Visit Date: 10/10/2019  Today's healthcare provider: [name], MD   Chief Complaint  Patient presents with  . Diabetes Mellitus  . Hypertension   Subjective    HPI Diabetes Mellitus Type II, follow-up  Lab Results  Component Value Date   HGBA1C 9.1 (A) 06/13/2019   HGBA1C 7.6 (A) 05/01/2018   HGBA1C 7.5 (A) 12/26/2017   Last seen for diabetes 4 months ago.  Management since then includes continuing the same treatment. She reports good compliance with treatment. She is not having side effects.   Home blood sugar records: has not been checking  Episodes of hypoglycemia? No    Current insulin regiment:   Most Recent Eye Exam: 2020  ----------------------------------------------------------------------------------------- Hypertension, follow-up  BP Readings from Last 3 Encounters:  10/10/19 128/77  06/13/19 117/71  04/11/19 (!) 142/71   She was last seen for hypertension 4 months ago.  BP at that visit was 117/71. Management since that visit includes controlled. She reports excellent compliance with treatment. She is not having side effects.  She is not exercising. She is not adherent to low salt diet.   Outside blood pressures are not checking.  She does not smoke.  Use of agents associated with hypertension: none.   ----------------------------------------------------------------------------------------  Neuropathy ([place]) From 06/13/2019-Progressive. Labs checked showing-stable. She has limited mobility but gets around nicely in an electric wheelchair she has  called a zinger Stage 3 chronic kidney  disease, unspecified whether stage 3a or 3b CKD From 06/13/2019-Stop meloxicam.  Chronic anemia From 06/13/2019-Stable.  Past Medical History:  Diagnosis Date  . Anemia    WAS ANEMIC  . Arthritis   . Blood transfusion    HAD 1 SEVERAL YRS AGO...3-4 YR  . Chronic kidney disease    SOME DECREASE IN KIDNEY FUNCTION  . Diabetes mellitus without complication ([place])   . Hypertension   . Normal cardiac stress test    REQUESTING COPY FROM [facility]  . Normal echocardiogram    REQUESTING THAT RESULT -- DR [name]   . PONV (postoperative nausea and vomiting)   . Shortness of breath   . Spondylolysis 1998       Medications: Outpatient Medications Prior to Visit  Medication Sig  . acetaminophen (TYLENOL) 500 MG tablet Take 500 mg by mouth every 6 (six) hours as needed.  Marland Kitchen albuterol (VENTOLIN HFA) 108 (90 Base) MCG/ACT inhaler Inhale 2 puffs into the lungs every 4 (four) hours as needed for wheezing or shortness of breath.  . ALPHA LIPOIC ACID PO Take by mouth daily.  Marland Kitchen aspirin EC 81 MG tablet Take 81 mg by mouth daily.   . blood glucose meter kit and supplies KIT Dispense based on patient and insurance preference. Use up to four times daily as directed. (FOR ICD-9 250.00, 250.01).  . Coenzyme Q10 (CO Q10) 200 MG CAPS Take by mouth daily.   . DULoxetine (CYMBALTA) 20 MG capsule Take 1 capsule (20 mg total) by mouth daily. (Patient taking differently: Take 20 mg by mouth daily. )  . esomeprazole (NEXIUM) 40 MG capsule TAKE 1 CAPSULE BY MOUTH EVERY DAY  . gabapentin (NEURONTIN)  600 MG tablet Take 1 tablet (600 mg total) by mouth 3 (three) times daily.  . glimepiride (AMARYL) 4 MG tablet TAKE 1 TABLET(4 MG) BY MOUTH DAILY BEFORE BREAKFAST  . glucose blood (ACCU-CHEK AVIVA PLUS) test strip Check blood sugar once daily, fasting Use as instructed  . hydroxypropyl methylcellulose (ISOPTO TEARS) 2.5 % ophthalmic solution Place 1 drop into both eyes 3 (three) times daily as needed. Dry eyes   .  Lancets (ACCU-CHEK SOFT TOUCH) lancets Use as instructed  . MAGNESIUM-OXIDE 400 (241.3 Mg) MG tablet TAKE 1 TABLET BY MOUTH TWICE DAILY  . MULTIPLE VITAMIN PO Take by mouth.  . pregabalin (LYRICA) 25 MG capsule Take 1 capsule (25 mg total) by mouth daily.  . rOPINIRole (REQUIP) 1 MG tablet Take 1 mg by mouth. 1/2 tablet four times daily.  . senna-docusate (SENOKOT-S) 8.6-50 MG tablet Take 1 tablet by mouth daily as needed.  . tamsulosin (FLOMAX) 0.4 MG CAPS capsule TAKE 1 CAPSULE(0.4 MG) BY MOUTH DAILY  . losartan (COZAAR) 25 MG tablet TAKE 1 TABLET BY MOUTH EVERY DAY (Patient not taking: Reported on 04/11/2019)  . predniSONE (STERAPRED UNI-PAK 21 TAB) 10 MG (21) TBPK tablet Take as per directed (Patient not taking: Reported on 03/13/2019)   No facility-administered medications prior to visit.    Review of Systems  Constitutional: Negative for appetite change, chills, fatigue and fever.  HENT: Positive for ear pain.        Chronic ongoing right ear pain that originates in right occiput  Eyes: Negative.   Respiratory: Negative for chest tightness and shortness of breath.   Cardiovascular: Negative for chest pain and palpitations.  Gastrointestinal: Negative for abdominal pain, nausea and vomiting.  Endocrine: Negative.   Musculoskeletal: Positive for arthralgias and back pain.  Neurological: Negative for dizziness and weakness.  Psychiatric/Behavioral: Negative.     Last hemoglobin A1c Lab Results  Component Value Date   HGBA1C 9.1 (A) 10/10/2019      Objective    BP 128/77 (BP Location: Left Arm, Patient Position: Sitting, Cuff Size: Large)   Pulse 87   Temp (!) 97.1 F (36.2 C) (Temporal)   Wt 179 lb (81.2 kg)   BMI 29.79 kg/m  BP Readings from Last 3 Encounters:  10/10/19 128/77  06/13/19 117/71  04/11/19 (!) 142/71   Wt Readings from Last 3 Encounters:  10/10/19 179 lb (81.2 kg)  06/13/19 176 lb (79.8 kg)  03/13/19 180 lb (81.6 kg)      Physical Exam Vitals  reviewed.  HENT:     Head: Normocephalic and atraumatic.     Right Ear: Tympanic membrane, ear canal and external ear normal.     Left Ear: Tympanic membrane, ear canal and external ear normal.  Eyes:     General: No scleral icterus.    Conjunctiva/sclera: Conjunctivae normal.  Neck:     Vascular: No carotid bruit.  Cardiovascular:     Rate and Rhythm: Normal rate and regular rhythm.     Heart sounds: Normal heart sounds.  Pulmonary:     Breath sounds: Normal breath sounds.  Abdominal:     Palpations: Abdomen is soft. There is no mass.     Comments: Normal abdominal exam.  Musculoskeletal:     Cervical back: No muscular tenderness.     Right lower leg: Edema present.     Left lower leg: Edema present.     Comments: Signifcant thoracic kyphosis. Trace LE edema.  Lymphadenopathy:     Cervical: No   cervical adenopathy.  Skin:    General: Skin is warm and dry.  Neurological:     General: No focal deficit present.     Mental Status: She is alert and oriented to person, place, and time.  Psychiatric:        Mood and Affect: Mood normal.        Behavior: Behavior normal.        Thought Content: Thought content normal.        Judgment: Judgment normal.       No results found for any visits on 10/10/19.  Assessment & Plan     1. Type 2 diabetes mellitus with diabetic polyneuropathy, without long-term current use of insulin (HCC) A1c is 9.1 today.  Patient eats sugar every night in the form of desserts.  I am not sure she is willing to give up the streets at 86 years old.  She is on glimepiride - POCT HgB A1C  2. Depression, unspecified depression type In partial remission.  3. Neuropathy (HCC) Increase duloxetine from 20 to 30 mg.  Possibly go to 60 on next visit. - DULoxetine (CYMBALTA) 30 MG capsule; Take 1 capsule (30 mg total) by mouth daily.  Dispense: 30 capsule; Refill: 3  4. Essential hypertension Controlled on losartan.  5. Chronic systolic CHF (congestive  heart failure), NYHA class 3 (HCC) Clinically stable  6. Chronic venous insufficiency   7. COPD, mild (HCC)   8. Restless leg Controlled on ropinirole, this is helped a lot.  9. Parkinsonian features   10. Spinal stenosis of lumbar region with neurogenic claudication I think your ear and occipital pain is from cervical spinal disease.  11. Anxiety, generalized Stable   No follow-ups on file.      I, Richard Gilbert Jr, MD, have reviewed all documentation for this visit. The documentation on 10/15/19 for the exam, diagnosis, procedures, and orders are all accurate and complete.    Richard Gilbert Jr, MD  [facility] [phone] (phone) [phone] (fax)  [facility] 

## 2019-10-11 ENCOUNTER — Ambulatory Visit (INDEPENDENT_AMBULATORY_CARE_PROVIDER_SITE_OTHER): Payer: Medicare Other | Admitting: Vascular Surgery

## 2019-10-12 DIAGNOSIS — R2689 Other abnormalities of gait and mobility: Secondary | ICD-10-CM | POA: Diagnosis not present

## 2019-10-12 DIAGNOSIS — M48061 Spinal stenosis, lumbar region without neurogenic claudication: Secondary | ICD-10-CM | POA: Diagnosis not present

## 2019-10-12 DIAGNOSIS — M6281 Muscle weakness (generalized): Secondary | ICD-10-CM | POA: Diagnosis not present

## 2019-10-16 DIAGNOSIS — M6281 Muscle weakness (generalized): Secondary | ICD-10-CM | POA: Diagnosis not present

## 2019-10-16 DIAGNOSIS — M48061 Spinal stenosis, lumbar region without neurogenic claudication: Secondary | ICD-10-CM | POA: Diagnosis not present

## 2019-10-16 DIAGNOSIS — R2689 Other abnormalities of gait and mobility: Secondary | ICD-10-CM | POA: Diagnosis not present

## 2019-10-19 DIAGNOSIS — M48061 Spinal stenosis, lumbar region without neurogenic claudication: Secondary | ICD-10-CM | POA: Diagnosis not present

## 2019-10-19 DIAGNOSIS — R2689 Other abnormalities of gait and mobility: Secondary | ICD-10-CM | POA: Diagnosis not present

## 2019-10-19 DIAGNOSIS — M6281 Muscle weakness (generalized): Secondary | ICD-10-CM | POA: Diagnosis not present

## 2019-10-25 ENCOUNTER — Other Ambulatory Visit: Payer: Self-pay

## 2019-10-25 ENCOUNTER — Ambulatory Visit (INDEPENDENT_AMBULATORY_CARE_PROVIDER_SITE_OTHER): Payer: Medicare PPO | Admitting: Nurse Practitioner

## 2019-10-25 ENCOUNTER — Encounter (INDEPENDENT_AMBULATORY_CARE_PROVIDER_SITE_OTHER): Payer: Self-pay | Admitting: Vascular Surgery

## 2019-10-25 VITALS — BP 143/76 | HR 88 | Resp 16 | Wt 179.0 lb

## 2019-10-25 DIAGNOSIS — I890 Lymphedema, not elsewhere classified: Secondary | ICD-10-CM

## 2019-10-25 DIAGNOSIS — I10 Essential (primary) hypertension: Secondary | ICD-10-CM | POA: Diagnosis not present

## 2019-10-25 DIAGNOSIS — I5022 Chronic systolic (congestive) heart failure: Secondary | ICD-10-CM

## 2019-10-25 NOTE — Progress Notes (Signed)
Subjective:    Patient ID: Renee Carpenter, female    DOB: [birth year], 86 y.o.   MRN: [id number] Chief Complaint  Patient presents with  . Follow-up    49monthfollow up     The patient returns to the office for followup evaluation regarding leg swelling.  The patient does have neuropathic pain in her lower extremities so wearing compression is difficult however she manages to wear them for several hours a day.  This seems to control her swelling well at this time.  The pain associated with swelling is essentially eliminated. There have not been any interval development of a ulcerations or wounds.  No episodes of cellulitis or infection over the past 12 months  The patient denies problems with the pump, noting it is working well and the leggings are in good condition.  However, the settings are little too high for the patient and it aggravates her nerve pain.  Since the previous visit the patient has been wearing graduated compression stockings  on a routine basis and  has noted significant improvement in the lymphedema.         Review of Systems  Cardiovascular: Positive for leg swelling.  Musculoskeletal: Positive for arthralgias.  Neurological: Positive for weakness and numbness.  All other systems reviewed and are negative.      Objective:   Physical Exam Vitals reviewed.  Constitutional:      Appearance: Normal appearance.  Cardiovascular:     Rate and Rhythm: Normal rate and regular rhythm.     Pulses: Normal pulses.     Heart sounds: Normal heart sounds.  Pulmonary:     Effort: Pulmonary effort is normal.     Breath sounds: Normal breath sounds.  Musculoskeletal:     Right lower leg: Edema present.     Left lower leg: Edema present.  Skin:    General: Skin is warm and dry.     Capillary Refill: Capillary refill takes less than 2 seconds.  Neurological:     Mental Status: She is alert and oriented to person, place, and time.  Psychiatric:        Mood and  Affect: Mood normal.        Behavior: Behavior normal.        Thought Content: Thought content normal.        Judgment: Judgment normal.     BP (!) 143/76 (BP Location: Right Arm)   Pulse 88   Resp 16   Wt 179 lb (81.2 kg)   BMI 29.79 kg/m   Past Medical History:  Diagnosis Date  . Anemia    WAS ANEMIC  . Arthritis   . Blood transfusion    HAD 1 SEVERAL YRS AGO...3-4 YR  . Chronic kidney disease    SOME DECREASE IN KIDNEY FUNCTION  . [place]   . Hypertension   . Normal cardiac stress test    REQUESTING COPY FROM [facility]  . Normal echocardiogram    REQUESTING THAT RESULT -- DR KChancy Milroy  . PONV (postoperative nausea and vomiting)   . Shortness of breath   . Spondylolysis 1998    Social History   Socioeconomic History  . Marital status: Widowed    Spouse name: Not on file  . Number of children: 1  . Years of education: Not on file  . Highest education level: Bachelor's degree (e.g., BA, AB, BS)  Occupational History  . Occupation: retired  Tobacco Use  .  Smoking status: Never Smoker  . Smokeless tobacco: Never Used  Substance and Sexual Activity  . Alcohol use: No  . Drug use: No  . Sexual activity: Never  Other Topics Concern  . Not on file  Social History Narrative  . Not on file   Social Determinants of Health   Financial Resource Strain:   . Difficulty of Paying Living Expenses:   Food Insecurity:   . Worried About Charity fundraiser in the Last Year:   . Arboriculturist in the Last Year:   Transportation Needs:   . Film/video editor (Medical):   Marland Kitchen Lack of Transportation (Non-Medical):   Physical Activity: Inactive  . Days of Exercise per Week: 0 days  . Minutes of Exercise per Session: 0 min  Stress: Stress Concern Present  . Feeling of Stress : To some extent  Social Connections: Unknown  . Frequency of Communication with Friends and Family: Patient refused  . Frequency of Social Gatherings  with Friends and Family: Patient refused  . Attends Religious Services: Patient refused  . Active Member of Clubs or Organizations: Patient refused  . Attends Archivist Meetings: Patient refused  . Marital Status: Patient refused  Intimate Partner Violence: Unknown  . Fear of Current or Ex-Partner: Patient refused  . Emotionally Abused: Patient refused  . Physically Abused: Patient refused  . Sexually Abused: Patient refused    Past Surgical History:  Procedure Laterality Date  . ABDOMINAL HYSTERECTOMY    . BACK SURGERY     2 CERVICAL SURGERIES ALSO  . CATARACTS     RIGHT EYE CATARACT  . CERVICAL DISCECTOMY  08/1995 and 2011   C4-,C5-6, C6-7 anterior cervical; Dr. Ellene Route fusion and plating  . LAMINECTOMY     decompressive; Dr. Burman Riis in [place] MICRODISCECTOMY  04/14/2011   Procedure: LUMBAR LAMINECTOMY/DECOMPRESSION MICRODISCECTOMY;  Surgeon: [name];  Location: [facility];  Service: Neurosurgery;  Laterality: N/A;  Lumbar Three and Lumbar Four Laminectomy  . TONSILLECTOMY AND ADENOIDECTOMY  1939    Family History  Problem Relation Age of Onset  . Heart failure Father   . Heart disease Father   . Hypertension Mother   . Diabetes Mother   . Heart disease Mother   . Cancer Sister        breast  . Breast cancer Sister 47  . Cancer Maternal Grandfather   . Cancer Maternal Aunt        x3  . Breast cancer Daughter 87    Allergies  Allergen Reactions  . Acetaminophen Itching and Other (See Comments)    Stomach upset  . Aspirin Itching and Other (See Comments)    Stomach pain  . Metformin And Related     nausea  . Other Other (See Comments)    Novocaine, strange feeling  . Sulfa Antibiotics Other (See Comments)  . Sulfasalazine     Other reaction(s): Other (See Comments)  . Tizanidine     Diarrhea, almost passed out, felt dizzy and confused  . Caffeine Palpitations  . Ginger Palpitations  . Ibuprofen Rash        Assessment & Plan:   1. Lymphedema  No surgery or intervention at this point in time.    I have reviewed my discussion with the patient regarding lymphedema and why it  causes symptoms.  Patient will continue wearing graduated compression stockings class 1 (20-30 mmHg) on a daily basis a prescription was given.  The patient is reminded to put the stockings on first thing in the morning and removing them in the evening. The patient is instructed specifically not to sleep in the stockings.   In addition, behavioral modification throughout the day will be continued.  This will include frequent elevation (such as in a recliner), use of over the counter pain medications as needed and exercise such as walking.  I have reviewed systemic causes for chronic edema such as liver, kidney and cardiac etiologies and there does not appear to be any significant changes in these organ systems over the past year.  The patient is under the impression that these organ systems are all stable and unchanged.    The patient will continue aggressive use of the  lymph pump.  This will continue to improve the edema control and prevent sequela such as ulcers and infections.   The patient will follow-up with me on an annual basis.    2. Chronic systolic CHF (congestive heart failure), NYHA class 3 ([place]) This may also exacerbate the patient's lymphedema however at this time the patient swelling is very much under control.  The patient is on appropriate medications.  Patient will continue to be managed by cardiology.  3. Essential hypertension Continue antihypertensive medications as already ordered, these medications have been reviewed and there are no changes at this time.    Current Outpatient Medications on File Prior to Visit  Medication Sig Dispense Refill  . acetaminophen (TYLENOL) 500 MG tablet Take 500 mg by mouth every 6 (six) hours as needed.    Marland Kitchen albuterol (VENTOLIN HFA) 108 (90 Base) MCG/ACT  inhaler Inhale 2 puffs into the lungs every 4 (four) hours as needed for wheezing or shortness of breath. 8 g 11  . ALPHA LIPOIC ACID PO Take by mouth daily.    Marland Kitchen aspirin EC 81 MG tablet Take 81 mg by mouth daily.     . blood glucose meter kit and supplies KIT Dispense based on patient and insurance preference. Use up to four times daily as directed. (FOR ICD-9 250.00, 250.01). 1 each 0  . Coenzyme Q10 (CO Q10) 200 MG CAPS Take by mouth daily.     . DULoxetine (CYMBALTA) 30 MG capsule Take 1 capsule (30 mg total) by mouth daily. 30 capsule 3  . esomeprazole (NEXIUM) 40 MG capsule TAKE 1 CAPSULE BY MOUTH EVERY DAY 90 capsule 3  . gabapentin (NEURONTIN) 600 MG tablet Take 1 tablet (600 mg total) by mouth 3 (three) times daily. 270 tablet 3  . glimepiride (AMARYL) 4 MG tablet TAKE 1 TABLET(4 MG) BY MOUTH DAILY BEFORE BREAKFAST 30 tablet 12  . glucose blood (ACCU-CHEK AVIVA PLUS) test strip Check blood sugar once daily, fasting Use as instructed 100 each 12  . hydroxypropyl methylcellulose (ISOPTO TEARS) 2.5 % ophthalmic solution Place 1 drop into both eyes 3 (three) times daily as needed. Dry eyes     . Lancets (ACCU-CHEK SOFT TOUCH) lancets Use as instructed 100 each 12  . MAGNESIUM-OXIDE 400 (241.3 Mg) MG tablet TAKE 1 TABLET BY MOUTH TWICE DAILY 60 tablet 11  . MULTIPLE VITAMIN PO Take by mouth.    . pregabalin (LYRICA) 25 MG capsule Take 1 capsule (25 mg total) by mouth daily. 30 capsule 5  . rOPINIRole (REQUIP) 1 MG tablet Take 1 mg by mouth. 1/2 tablet four times daily.    Marland Kitchen senna-docusate (SENOKOT-S) 8.6-50 MG tablet Take 1 tablet by mouth daily as needed.    . tamsulosin (  FLOMAX) 0.4 MG CAPS capsule TAKE 1 CAPSULE(0.4 MG) BY MOUTH DAILY 90 capsule 3  . losartan (COZAAR) 25 MG tablet TAKE 1 TABLET BY MOUTH EVERY DAY (Patient not taking: Reported on 04/11/2019) 90 tablet 3  . predniSONE (STERAPRED UNI-PAK 21 TAB) 10 MG (21) TBPK tablet Take as per directed (Patient not taking: Reported on  03/13/2019) 21 tablet 0   No current facility-administered medications on file prior to visit.    There are no Patient Instructions on file for this visit. No follow-ups on file.   Kris Hartmann, NP

## 2019-10-30 ENCOUNTER — Other Ambulatory Visit: Payer: Self-pay | Admitting: Family Medicine

## 2019-11-13 DIAGNOSIS — L570 Actinic keratosis: Secondary | ICD-10-CM | POA: Diagnosis not present

## 2019-11-13 DIAGNOSIS — L821 Other seborrheic keratosis: Secondary | ICD-10-CM | POA: Diagnosis not present

## 2019-11-13 DIAGNOSIS — L814 Other melanin hyperpigmentation: Secondary | ICD-10-CM | POA: Diagnosis not present

## 2019-11-15 DIAGNOSIS — M6281 Muscle weakness (generalized): Secondary | ICD-10-CM | POA: Diagnosis not present

## 2019-11-15 DIAGNOSIS — R2689 Other abnormalities of gait and mobility: Secondary | ICD-10-CM | POA: Diagnosis not present

## 2019-11-15 DIAGNOSIS — M48061 Spinal stenosis, lumbar region without neurogenic claudication: Secondary | ICD-10-CM | POA: Diagnosis not present

## 2019-11-20 DIAGNOSIS — M48061 Spinal stenosis, lumbar region without neurogenic claudication: Secondary | ICD-10-CM | POA: Diagnosis not present

## 2019-11-20 DIAGNOSIS — M6281 Muscle weakness (generalized): Secondary | ICD-10-CM | POA: Diagnosis not present

## 2019-11-20 DIAGNOSIS — R2689 Other abnormalities of gait and mobility: Secondary | ICD-10-CM | POA: Diagnosis not present

## 2019-11-23 DIAGNOSIS — Z741 Need for assistance with personal care: Secondary | ICD-10-CM | POA: Diagnosis not present

## 2019-11-23 DIAGNOSIS — M6281 Muscle weakness (generalized): Secondary | ICD-10-CM | POA: Diagnosis not present

## 2019-11-23 DIAGNOSIS — M5416 Radiculopathy, lumbar region: Secondary | ICD-10-CM | POA: Diagnosis not present

## 2019-11-23 DIAGNOSIS — R262 Difficulty in walking, not elsewhere classified: Secondary | ICD-10-CM | POA: Diagnosis not present

## 2019-11-23 DIAGNOSIS — M48062 Spinal stenosis, lumbar region with neurogenic claudication: Secondary | ICD-10-CM | POA: Diagnosis not present

## 2019-12-27 DIAGNOSIS — R262 Difficulty in walking, not elsewhere classified: Secondary | ICD-10-CM | POA: Diagnosis not present

## 2019-12-27 DIAGNOSIS — M6281 Muscle weakness (generalized): Secondary | ICD-10-CM | POA: Diagnosis not present

## 2019-12-27 DIAGNOSIS — Z741 Need for assistance with personal care: Secondary | ICD-10-CM | POA: Diagnosis not present

## 2019-12-27 DIAGNOSIS — M5416 Radiculopathy, lumbar region: Secondary | ICD-10-CM | POA: Diagnosis not present

## 2019-12-27 DIAGNOSIS — M48062 Spinal stenosis, lumbar region with neurogenic claudication: Secondary | ICD-10-CM | POA: Diagnosis not present

## 2020-01-03 DIAGNOSIS — M5416 Radiculopathy, lumbar region: Secondary | ICD-10-CM | POA: Diagnosis not present

## 2020-01-03 DIAGNOSIS — Z741 Need for assistance with personal care: Secondary | ICD-10-CM | POA: Diagnosis not present

## 2020-01-03 DIAGNOSIS — M6281 Muscle weakness (generalized): Secondary | ICD-10-CM | POA: Diagnosis not present

## 2020-01-03 DIAGNOSIS — R262 Difficulty in walking, not elsewhere classified: Secondary | ICD-10-CM | POA: Diagnosis not present

## 2020-01-03 DIAGNOSIS — M48062 Spinal stenosis, lumbar region with neurogenic claudication: Secondary | ICD-10-CM | POA: Diagnosis not present

## 2020-01-07 DIAGNOSIS — M5416 Radiculopathy, lumbar region: Secondary | ICD-10-CM | POA: Diagnosis not present

## 2020-01-07 DIAGNOSIS — Z741 Need for assistance with personal care: Secondary | ICD-10-CM | POA: Diagnosis not present

## 2020-01-07 DIAGNOSIS — M6281 Muscle weakness (generalized): Secondary | ICD-10-CM | POA: Diagnosis not present

## 2020-01-07 DIAGNOSIS — R262 Difficulty in walking, not elsewhere classified: Secondary | ICD-10-CM | POA: Diagnosis not present

## 2020-01-07 DIAGNOSIS — M48062 Spinal stenosis, lumbar region with neurogenic claudication: Secondary | ICD-10-CM | POA: Diagnosis not present

## 2020-01-10 ENCOUNTER — Ambulatory Visit: Payer: Self-pay | Admitting: Family Medicine

## 2020-01-10 DIAGNOSIS — M6281 Muscle weakness (generalized): Secondary | ICD-10-CM | POA: Diagnosis not present

## 2020-01-10 DIAGNOSIS — M48062 Spinal stenosis, lumbar region with neurogenic claudication: Secondary | ICD-10-CM | POA: Diagnosis not present

## 2020-01-10 DIAGNOSIS — M5416 Radiculopathy, lumbar region: Secondary | ICD-10-CM | POA: Diagnosis not present

## 2020-01-10 DIAGNOSIS — Z741 Need for assistance with personal care: Secondary | ICD-10-CM | POA: Diagnosis not present

## 2020-01-10 DIAGNOSIS — R262 Difficulty in walking, not elsewhere classified: Secondary | ICD-10-CM | POA: Diagnosis not present

## 2020-01-15 NOTE — Progress Notes (Signed)
I,[name],acting as a scribe for [name], MD.,have documented all relevant documentation on the behalf of [name], MD,as directed by  [name], MD while in the presence of [name], MD.   Established patient visit   Patient: Renee Carpenter   DOB: 1926/03/15   86 y.o. Female  MRN: [id number] Visit Date: 01/17/2020  Today's healthcare provider: [name], MD   Chief Complaint  Patient presents with  . Diabetes  . Follow-up   Subjective    HPI  Diabetes Mellitus Type II, follow-up  Lab Results  Component Value Date   HGBA1C 8.8 (A) 01/17/2020   HGBA1C 9.1 (A) 10/10/2019   HGBA1C 9.1 (A) 06/13/2019   Last seen for diabetes 3 months ago.  Management since then includes; Patient eats sugar every night in the form of desserts.  I am not sure she is willing to give up the sweets at 86 years old.  She is on glimepiride. She reports good compliance with treatment. She is not having side effects. none  Home blood sugar records: fasting range: 178-200  Episodes of hypoglycemia? No none   Current insulin regiment: n/a Most Recent Eye Exam: due  --------------------------------------------------------------------  Hypertension, follow-up  BP Readings from Last 3 Encounters:  01/17/20 (!) 149/86  10/25/19 (!) 143/76  10/10/19 128/77   Wt Readings from Last 3 Encounters:  01/17/20 172 lb (78 kg)  10/25/19 179 lb (81.2 kg)  10/10/19 179 lb (81.2 kg)     She was last seen for hypertension 3 months ago.  BP at that visit was 143/76. Management since that visit includes; Controlled on losartan. She reports good compliance with treatment. She is not having side effects. none She is not exercising. She is adherent to low salt diet.   Outside blood pressures are not checking.  She does not smoke.  Use of agents associated with hypertension: none.    --------------------------------------------------------------------  Anxiety, Follow-up  She was last seen for anxiety 3 months ago. Changes made at last visit include; stable.   She reports good compliance with treatment. She reports good tolerance of treatment. She is not having side effects. none  She feels her anxiety is mild and Unchanged since last visit.  GAD-7 Results GAD-7 Generalized Anxiety Disorder Screening Tool 01/17/2020  1. Feeling Nervous, Anxious, or on Edge 1  2. Not Being Able to Stop or Control Worrying 1  3. Worrying Too Much About Different Things 1  4. Trouble Relaxing 1  5. Being So Restless it's Hard To Sit Still 1  6. Becoming Easily Annoyed or Irritable 1  7. Feeling Afraid As If Something Awful Might Happen 1  Total GAD-7 Score 7  Difficulty At Work, Home, or Getting  Along With Others? Somewhat difficult    PHQ-9 Scores PHQ9 SCORE ONLY 01/17/2020 02/08/2019 08/24/2017  PHQ-9 Total Score 9 0 1    --------------------------------------------------------------------  Depression, Follow-up  She  was last seen for this 3 months ago. Changes made at last visit include; In partial remission.   She reports good compliance with treatment. She is not having side effects. none  She reports good tolerance of treatment. Current symptoms include: fatigue She feels she is Unchanged since last visit.  Depression screen [facility] 2/9 01/17/2020 02/08/2019 08/24/2017  Decreased Interest 1 0 0  Down, Depressed, Hopeless 1 0 1  PHQ - 2 Score 2 0 1  Altered sleeping 2 - -  Tired, decreased energy 1 - -  Change in appetite 1 - -  Feeling bad or failure about yourself  1 - -  Trouble concentrating 1 - -  Moving slowly or fidgety/restless 1 - -  Suicidal thoughts 0 - -  PHQ-9 Score 9 - -  Difficult doing work/chores Somewhat difficult - -    --------------------------------------------------------------------  Neuropathy (HCC) From 10/10/2019-Increased  duloxetine from 20 to 30 mg.  Possibly go to 60 on next visit.      Medications: Outpatient Medications Prior to Visit  Medication Sig  . acetaminophen (TYLENOL) 500 MG tablet Take 500 mg by mouth every 6 (six) hours as needed.  Marland Kitchen albuterol (VENTOLIN HFA) 108 (90 Base) MCG/ACT inhaler Inhale 2 puffs into the lungs every 4 (four) hours as needed for wheezing or shortness of breath.  . ALPHA LIPOIC ACID PO Take by mouth daily.  Marland Kitchen aspirin EC 81 MG tablet Take 81 mg by mouth daily.   . blood glucose meter kit and supplies KIT Dispense based on patient and insurance preference. Use up to four times daily as directed. (FOR ICD-9 250.00, 250.01).  . Coenzyme Q10 (CO Q10) 200 MG CAPS Take by mouth daily.   . DULoxetine (CYMBALTA) 30 MG capsule Take 1 capsule (30 mg total) by mouth daily.  Marland Kitchen esomeprazole (NEXIUM) 40 MG capsule TAKE 1 CAPSULE BY MOUTH EVERY DAY  . gabapentin (NEURONTIN) 600 MG tablet Take 1 tablet (600 mg total) by mouth 3 (three) times daily.  Marland Kitchen glimepiride (AMARYL) 4 MG tablet TAKE 1 TABLET(4 MG) BY MOUTH DAILY BEFORE BREAKFAST  . glucose blood (ACCU-CHEK AVIVA PLUS) test strip Check blood sugar once daily, fasting Use as instructed  . hydroxypropyl methylcellulose (ISOPTO TEARS) 2.5 % ophthalmic solution Place 1 drop into both eyes 3 (three) times daily as needed. Dry eyes   . Lancets (ACCU-CHEK SOFT TOUCH) lancets Use as instructed  . losartan (COZAAR) 25 MG tablet TAKE 1 TABLET BY MOUTH EVERY DAY  . MAGNESIUM-OXIDE 400 (241.3 Mg) MG tablet TAKE 1 TABLET BY MOUTH TWICE DAILY  . MULTIPLE VITAMIN PO Take by mouth.  . predniSONE (STERAPRED UNI-PAK 21 TAB) 10 MG (21) TBPK tablet Take as per directed  . rOPINIRole (REQUIP) 1 MG tablet Take 1 mg by mouth. 1/2 tablet four times daily.  Marland Kitchen senna-docusate (SENOKOT-S) 8.6-50 MG tablet Take 1 tablet by mouth daily as needed.  . tamsulosin (FLOMAX) 0.4 MG CAPS capsule TAKE 1 CAPSULE(0.4 MG) BY MOUTH DAILY  . [DISCONTINUED] pregabalin  (LYRICA) 25 MG capsule Take 1 capsule (25 mg total) by mouth daily.   No facility-administered medications prior to visit.    Review of Systems  Constitutional: Negative for appetite change, chills, fatigue and fever.  Respiratory: Negative for chest tightness and shortness of breath.   Cardiovascular: Negative for chest pain and palpitations.  Gastrointestinal: Negative for abdominal pain, nausea and vomiting.  Neurological: Negative for dizziness and weakness.    Last hemoglobin A1c Lab Results  Component Value Date   HGBA1C 8.8 (A) 01/17/2020      Objective    BP (!) 149/86 (BP Location: Left Arm, Patient Position: Sitting, Cuff Size: Large)   Pulse 90   Temp 98.1 F (36.7 C) (Oral)   Resp 18   Ht 5\' 5"  (1.651 m)   Wt 172 lb (78 kg)   SpO2 99%   BMI 28.62 kg/m  BP Readings from Last 3 Encounters:  01/17/20 (!) 149/86  10/25/19 (!) 143/76  10/10/19 128/77   Wt Readings from Last 3  Encounters:  01/17/20 172 lb (78 kg)  10/25/19 179 lb (81.2 kg)  10/10/19 179 lb (81.2 kg)      Physical Exam Vitals reviewed.  HENT:     Head: Normocephalic and atraumatic.     Right Ear: Tympanic membrane, ear canal and external ear normal.     Left Ear: Tympanic membrane, ear canal and external ear normal.  Eyes:     General: No scleral icterus.    Conjunctiva/sclera: Conjunctivae normal.  Neck:     Vascular: No carotid bruit.  Cardiovascular:     Rate and Rhythm: Normal rate and regular rhythm.     Heart sounds: Normal heart sounds.  Pulmonary:     Breath sounds: Normal breath sounds.  Abdominal:     Palpations: Abdomen is soft. There is no mass.     Comments: Normal abdominal exam.  Musculoskeletal:     Cervical back: No muscular tenderness.     Right lower leg: Edema present.     Left lower leg: Edema present.     Comments: Signifcant thoracic kyphosis. Trace LE edema.Lymphedema.  Lymphadenopathy:     Cervical: No cervical adenopathy.  Skin:    General: Skin is  warm and dry.  Neurological:     General: No focal deficit present.     Mental Status: She is alert and oriented to person, place, and time.  Psychiatric:        Mood and Affect: Mood normal.        Behavior: Behavior normal.        Thought Content: Thought content normal.        Judgment: Judgment normal.       Results for orders placed or performed in visit on 01/17/20  POCT HgB A1C  Result Value Ref Range   Hemoglobin A1C 8.8 (A) 4.0 - 5.6 %   Est. average glucose Bld gHb Est-mCnc 206     Assessment & Plan     1. Type 2 diabetes mellitus with diabetic polyneuropathy, without long-term current use of insulin (HCC) A1c 8.0 today. - POCT HgB A1C  2. Essential hypertension   3. Anxiety, generalized   4. Depression, unspecified depression type   5. Neuropathy (HCC) Double Lyrica to twice daily. - pregabalin (LYRICA) 25 MG capsule; Take 1 capsule (25 mg total) by mouth 2 (two) times daily.  Dispense: 60 capsule; Refill: 5  6. Lymphedema  - Ambulatory referral to Vascular Surgery  7. Constipation, unspecified constipation type Stop alpha lipoic acid and Senokot and try Metamucil daily - CBC w/Diff/Platelet - Comprehensive Metabolic Panel (CMET)  8. Age-related osteoporosis without current pathological fracture  - CBC w/Diff/Platelet - Comprehensive Metabolic Panel (CMET)  9. Chronic anemia  - Comprehensive Metabolic Panel (CMET)  10. Stage 3 chronic kidney disease, unspecified whether stage 3a or 3b CKD  - CBC w/Diff/Platelet - Comprehensive Metabolic Panel (CMET)  11. Sleep apnea, unspecified type I am not sure she has sleep apnea but home aide says she snores and has apneic spells. - Home sleep test  12. Snores  - Home sleep test   Return in about 3 months (around 04/18/2020).         Kamelia Lampkins Cranford Mon, MD  [facility] [phone] (phone) [phone] (fax)  [place]

## 2020-01-16 DIAGNOSIS — M5416 Radiculopathy, lumbar region: Secondary | ICD-10-CM | POA: Diagnosis not present

## 2020-01-16 DIAGNOSIS — M6281 Muscle weakness (generalized): Secondary | ICD-10-CM | POA: Diagnosis not present

## 2020-01-16 DIAGNOSIS — Z741 Need for assistance with personal care: Secondary | ICD-10-CM | POA: Diagnosis not present

## 2020-01-16 DIAGNOSIS — M48062 Spinal stenosis, lumbar region with neurogenic claudication: Secondary | ICD-10-CM | POA: Diagnosis not present

## 2020-01-16 DIAGNOSIS — R262 Difficulty in walking, not elsewhere classified: Secondary | ICD-10-CM | POA: Diagnosis not present

## 2020-01-17 ENCOUNTER — Ambulatory Visit: Payer: Medicare PPO | Admitting: Family Medicine

## 2020-01-17 ENCOUNTER — Other Ambulatory Visit: Payer: Self-pay

## 2020-01-17 ENCOUNTER — Encounter: Payer: Self-pay | Admitting: Family Medicine

## 2020-01-17 VITALS — BP 149/86 | HR 90 | Temp 98.1°F | Resp 18 | Ht 65.0 in | Wt 172.0 lb

## 2020-01-17 DIAGNOSIS — D649 Anemia, unspecified: Secondary | ICD-10-CM

## 2020-01-17 DIAGNOSIS — M810 Age-related osteoporosis without current pathological fracture: Secondary | ICD-10-CM

## 2020-01-17 DIAGNOSIS — I10 Essential (primary) hypertension: Secondary | ICD-10-CM

## 2020-01-17 DIAGNOSIS — F329 Major depressive disorder, single episode, unspecified: Secondary | ICD-10-CM

## 2020-01-17 DIAGNOSIS — R0683 Snoring: Secondary | ICD-10-CM

## 2020-01-17 DIAGNOSIS — G629 Polyneuropathy, unspecified: Secondary | ICD-10-CM | POA: Diagnosis not present

## 2020-01-17 DIAGNOSIS — N183 Chronic kidney disease, stage 3 unspecified: Secondary | ICD-10-CM

## 2020-01-17 DIAGNOSIS — K5900 Constipation, unspecified: Secondary | ICD-10-CM | POA: Diagnosis not present

## 2020-01-17 DIAGNOSIS — I890 Lymphedema, not elsewhere classified: Secondary | ICD-10-CM

## 2020-01-17 DIAGNOSIS — F411 Generalized anxiety disorder: Secondary | ICD-10-CM

## 2020-01-17 DIAGNOSIS — G4730 Sleep apnea, unspecified: Secondary | ICD-10-CM

## 2020-01-17 DIAGNOSIS — F32A Depression, unspecified: Secondary | ICD-10-CM

## 2020-01-17 DIAGNOSIS — E1142 Type 2 diabetes mellitus with diabetic polyneuropathy: Secondary | ICD-10-CM

## 2020-01-17 LAB — POCT GLYCOSYLATED HEMOGLOBIN (HGB A1C)
Est. average glucose Bld gHb Est-mCnc: 206
Hemoglobin A1C: 8.8 % — AB (ref 4.0–5.6)

## 2020-01-17 MED ORDER — PREGABALIN 25 MG PO CAPS: 25.0000 mg | ORAL_CAPSULE | Freq: Two times a day (BID) | ORAL | 5 refills | Status: DC

## 2020-01-17 NOTE — Patient Instructions (Addendum)
Stop Lipoic Acid and Senokot. Try over-the-counter Metamucil dose every morning. Increase Lyrica 25 mg to twice daily.

## 2020-01-18 ENCOUNTER — Telehealth: Payer: Self-pay

## 2020-01-18 LAB — CBC WITH DIFFERENTIAL/PLATELET
Basophils Absolute: 0.1 10*3/uL (ref 0.0–0.2)
Basos: 1 %
EOS (ABSOLUTE): 0.2 10*3/uL (ref 0.0–0.4)
Eos: 3 %
Hematocrit: 32.6 % — ABNORMAL LOW (ref 34.0–46.6)
Hemoglobin: 11.4 g/dL (ref 11.1–15.9)
Immature Grans (Abs): 0 10*3/uL (ref 0.0–0.1)
Immature Granulocytes: 1 %
Lymphocytes Absolute: 3.7 10*3/uL — ABNORMAL HIGH (ref 0.7–3.1)
Lymphs: 46 %
MCH: 40 pg — ABNORMAL HIGH (ref 26.6–33.0)
MCHC: 35 g/dL (ref 31.5–35.7)
MCV: 114 fL — ABNORMAL HIGH (ref 79–97)
Monocytes Absolute: 0.8 10*3/uL (ref 0.1–0.9)
Monocytes: 10 %
Neutrophils Absolute: 3.1 10*3/uL (ref 1.4–7.0)
Neutrophils: 39 %
Platelets: 275 10*3/uL (ref 150–450)
RBC: 2.85 x10E6/uL — ABNORMAL LOW (ref 3.77–5.28)
RDW: 14.8 % (ref 11.7–15.4)
WBC: 8 10*3/uL (ref 3.4–10.8)

## 2020-01-18 LAB — COMPREHENSIVE METABOLIC PANEL
ALT: 22 IU/L (ref 0–32)
AST: 30 IU/L (ref 0–40)
Albumin/Globulin Ratio: 1.6 (ref 1.2–2.2)
Albumin: 4.1 g/dL (ref 3.5–4.6)
Alkaline Phosphatase: 96 IU/L (ref 48–121)
BUN/Creatinine Ratio: 17 (ref 12–28)
BUN: 21 mg/dL (ref 10–36)
Bilirubin Total: 0.3 mg/dL (ref 0.0–1.2)
CO2: 30 mmol/L — ABNORMAL HIGH (ref 20–29)
Calcium: 9.3 mg/dL (ref 8.7–10.3)
Chloride: 104 mmol/L (ref 96–106)
Creatinine, Ser: 1.23 mg/dL — ABNORMAL HIGH (ref 0.57–1.00)
GFR calc Af Amer: 44 mL/min/{1.73_m2} — ABNORMAL LOW (ref >59)
GFR calc non Af Amer: 38 mL/min/{1.73_m2} — ABNORMAL LOW (ref >59)
Globulin, Total: 2.6 g/dL (ref 1.5–4.5)
Glucose: 205 mg/dL — ABNORMAL HIGH (ref 65–99)
Potassium: 4.7 mmol/L (ref 3.5–5.2)
Sodium: 145 mmol/L — ABNORMAL HIGH (ref 134–144)
Total Protein: 6.7 g/dL (ref 6.0–8.5)

## 2020-01-18 NOTE — Telephone Encounter (Signed)
-----   Message from Maple Hudson., MD sent at 01/18/2020  9:39 AM EDT ----- Labs stable.

## 2020-01-18 NOTE — Telephone Encounter (Signed)
Patient advised of lab results

## 2020-01-20 MED ORDER — PREGABALIN 25 MG PO CAPS: 25.0000 mg | ORAL_CAPSULE | Freq: Two times a day (BID) | ORAL | 5 refills | Status: DC

## 2020-01-21 ENCOUNTER — Other Ambulatory Visit: Payer: Self-pay | Admitting: Family Medicine

## 2020-01-21 MED ORDER — GABAPENTIN 600 MG PO TABS: 600.0000 mg | ORAL_TABLET | Freq: Three times a day (TID) | ORAL | 3 refills | Status: DC

## 2020-01-21 MED ORDER — ALBUTEROL SULFATE HFA 108 (90 BASE) MCG/ACT IN AERS: 2.0000 | INHALATION_SPRAY | RESPIRATORY_TRACT | 2 refills | Status: DC | PRN

## 2020-01-21 NOTE — Telephone Encounter (Signed)
Pt request refill  gabapentin (NEURONTIN) 600 MG tablet  Pt takes TID.  Pt unsure if the neurologist was last to prescribe, but she states what is wrong with Dr Sullivan Lone prescribing?   Also requesting albuterol (VENTOLIN HFA) 108 (90 Base) MCG/ACT inhaler  [id number] [place], [place] - [address] AT [facility] OF [address] Phone:  [phone]  Fax:  [phone]

## 2020-01-21 NOTE — Telephone Encounter (Signed)
Requested medication (s) are due for refill today: Yes  Requested medication (s) are on the active medication list: Yes  Last refill:  Gabapentin 09/22/16  Albuterol 11/28/18  Future visit scheduled: Yes  Notes to clinic:  Prescriptions have expired.    Requested Prescriptions  Pending Prescriptions Disp Refills   gabapentin (NEURONTIN) 600 MG tablet 270 tablet 3    Sig: Take 1 tablet (600 mg total) by mouth 3 (three) times daily.      Neurology: Anticonvulsants - gabapentin Passed - 01/21/2020 12:53 PM      Passed - Valid encounter within last 12 months    Recent Outpatient Visits           4 days ago Type 2 diabetes mellitus with diabetic polyneuropathy, without long-term current use of insulin [facility])   [facility] Maple Hudson., MD   3 months ago Type 2 diabetes mellitus with diabetic polyneuropathy, without long-term current use of insulin [facility])   [facility] Maple Hudson., MD   7 months ago Controlled type 2 diabetes mellitus with complication, without long-term current use of insulin [facility])   [facility] Maple Hudson., MD   10 months ago Need for influenza vaccination   [facility] Maple Hudson., MD   11 months ago Tooth abscess   [facility] Maple Hudson., MD       Future Appointments             In 3 months Maple Hudson., MD [facility]              albuterol (VENTOLIN HFA) 108 (90 Base) MCG/ACT inhaler 8 g 2    Sig: Inhale 2 puffs into the lungs every 4 (four) hours as needed for wheezing or shortness of breath.      Pulmonology:  Beta Agonists Failed - 01/21/2020 12:53 PM      Failed - One inhaler should last at least one month. If the patient is requesting refills earlier, contact the patient to check for uncontrolled symptoms.      Passed - Valid encounter within last 12 months    Recent Outpatient Visits            4 days ago Type 2 diabetes mellitus with diabetic polyneuropathy, without long-term current use of insulin [facility])   [facility] Maple Hudson., MD   3 months ago Type 2 diabetes mellitus with diabetic polyneuropathy, without long-term current use of insulin [facility])   [facility] Maple Hudson., MD   7 months ago Controlled type 2 diabetes mellitus with complication, without long-term current use of insulin [facility])   [facility] Maple Hudson., MD   10 months ago Need for influenza vaccination   [facility] Maple Hudson., MD   11 months ago Tooth abscess   [facility] Maple Hudson., MD       Future Appointments             In 3 months Maple Hudson., MD [facility]

## 2020-01-22 ENCOUNTER — Telehealth: Payer: Self-pay | Admitting: *Deleted

## 2020-01-22 NOTE — Telephone Encounter (Signed)
Copied from CRM [id number]. Topic: General - Other >> Jan 22, 2020  9:42 AM Lyn Hollingshead D wrote: PT need advise on how to take this medication / tajpregabalin (LYRICA) 25 MG capsule [[id number]]

## 2020-01-22 NOTE — Telephone Encounter (Signed)
Patient advised that she is only supposed to take 1 tablet of Duloxetine daily and Pregablin (Lyrica) is to be taken twice a day. Patient verbalized understanding.

## 2020-01-24 DIAGNOSIS — R262 Difficulty in walking, not elsewhere classified: Secondary | ICD-10-CM | POA: Diagnosis not present

## 2020-01-24 DIAGNOSIS — Z741 Need for assistance with personal care: Secondary | ICD-10-CM | POA: Diagnosis not present

## 2020-01-24 DIAGNOSIS — M6281 Muscle weakness (generalized): Secondary | ICD-10-CM | POA: Diagnosis not present

## 2020-01-24 DIAGNOSIS — M48062 Spinal stenosis, lumbar region with neurogenic claudication: Secondary | ICD-10-CM | POA: Diagnosis not present

## 2020-01-24 DIAGNOSIS — M5416 Radiculopathy, lumbar region: Secondary | ICD-10-CM | POA: Diagnosis not present

## 2020-01-28 ENCOUNTER — Telehealth: Payer: Self-pay | Admitting: Family Medicine

## 2020-01-28 ENCOUNTER — Other Ambulatory Visit: Payer: Self-pay | Admitting: Family Medicine

## 2020-01-28 DIAGNOSIS — E118 Type 2 diabetes mellitus with unspecified complications: Secondary | ICD-10-CM

## 2020-01-28 NOTE — Telephone Encounter (Signed)
Copied from CRM [id number]. Topic: Quick Communication - See Telephone Encounter >> Jan 28, 2020 10:03 AM Aretta Nip wrote: CRM for notification. See Telephone encounter for: 01/28/20.Reach out to pt re: sleep study referral that she thought that Dr Reece Agar mentioned? [phone] A nurse just to clarify would be fine.

## 2020-01-28 NOTE — Telephone Encounter (Signed)
Requested Prescriptions  Pending Prescriptions Disp Refills  . Accu-Chek FastClix Lancets MISC [Pharmacy Med Name: ACCU-CHEK FASTCLIX LANCETS 102'S] 102 each 1    Sig: USE TO TEST BLOOD SUGAR UP TO 4 TIMES DAILY AS DIRECTED     Endocrinology: Diabetes - Testing Supplies Passed - 01/28/2020 11:38 AM      Passed - Valid encounter within last 12 months    Recent Outpatient Visits          1 week ago Type 2 diabetes mellitus with diabetic polyneuropathy, without long-term current use of insulin [facility])   [facility] Maple Hudson., MD   3 months ago Type 2 diabetes mellitus with diabetic polyneuropathy, without long-term current use of insulin [facility])   [facility] Maple Hudson., MD   7 months ago Controlled type 2 diabetes mellitus with complication, without long-term current use of insulin [facility])   [facility] Maple Hudson., MD   10 months ago Need for influenza vaccination   [facility] Maple Hudson., MD   11 months ago Tooth abscess   [facility] Maple Hudson., MD      Future Appointments            In 2 months Maple Hudson., MD [facility]           . ACCU-CHEK GUIDE test strip [Pharmacy Med Name: ACCU-CHEK GUIDE TEST STRIPS 100S] 100 strip 1    Sig: USE TO TEST BLOOD SUGAR UP TO 4 X DAILY     Endocrinology: Diabetes - Testing Supplies Passed - 01/28/2020 11:38 AM      Passed - Valid encounter within last 12 months    Recent Outpatient Visits          1 week ago Type 2 diabetes mellitus with diabetic polyneuropathy, without long-term current use of insulin [facility])   [facility] Maple Hudson., MD   3 months ago Type 2 diabetes mellitus with diabetic polyneuropathy, without long-term current use of insulin [facility])   [facility] Maple Hudson., MD   7 months ago Controlled type 2 diabetes  mellitus with complication, without long-term current use of insulin [facility])   [facility] Maple Hudson., MD   10 months ago Need for influenza vaccination   [facility] Maple Hudson., MD   11 months ago Tooth abscess   [facility] Maple Hudson., MD      Future Appointments            In 2 months Maple Hudson., MD [facility]

## 2020-01-29 ENCOUNTER — Other Ambulatory Visit: Payer: Self-pay | Admitting: *Deleted

## 2020-01-29 DIAGNOSIS — G4730 Sleep apnea, unspecified: Secondary | ICD-10-CM

## 2020-01-29 DIAGNOSIS — R0683 Snoring: Secondary | ICD-10-CM

## 2020-01-29 NOTE — Telephone Encounter (Signed)
Home sleep study please.  Thanks

## 2020-01-29 NOTE — Telephone Encounter (Signed)
Sleep study was ordered but not done yet. Reordered sleep study. Tried calling patient to advise but line was busy. Will try again later.

## 2020-01-31 DIAGNOSIS — R262 Difficulty in walking, not elsewhere classified: Secondary | ICD-10-CM | POA: Diagnosis not present

## 2020-01-31 DIAGNOSIS — M6281 Muscle weakness (generalized): Secondary | ICD-10-CM | POA: Diagnosis not present

## 2020-01-31 DIAGNOSIS — M48062 Spinal stenosis, lumbar region with neurogenic claudication: Secondary | ICD-10-CM | POA: Diagnosis not present

## 2020-01-31 DIAGNOSIS — Z741 Need for assistance with personal care: Secondary | ICD-10-CM | POA: Diagnosis not present

## 2020-01-31 DIAGNOSIS — M5416 Radiculopathy, lumbar region: Secondary | ICD-10-CM | POA: Diagnosis not present

## 2020-02-01 NOTE — Telephone Encounter (Signed)
Pt returned office call. Pt says that she hasn't received a call to scheduled sleep study. Pt says that she has been waiting. Also, pt says that she was suppose to have another vein and vascular visit sooner that May 2022, pt would like further assistance.

## 2020-02-04 NOTE — Telephone Encounter (Signed)
Called to advise patient that both referrals have already been placed. Referral to pulmonology placed on 8/24 for patient to have sleep study and referral for Vein and Vascular placed 8/12. Also seen note from 8/16 where Philipp Ovens was asking if these where new symptoms that the patient was having because she is already a patient at the office. Unable to leave a voicemail for the patient to call back, will try again later.

## 2020-02-06 ENCOUNTER — Telehealth: Payer: Self-pay | Admitting: *Deleted

## 2020-02-06 NOTE — Telephone Encounter (Signed)
Copied from CRM [id number]. Topic: General - Other >> Feb 06, 2020 11:51 AM Renee Carpenter wrote: Reason for CRM: Pt stated she had a visit with Dr. Sullivan Lone and now she would like to speak with him or his nurse regarding her breathing. Offered to schedule an appt but pt declined and requested that Dr. Sullivan Lone or his nurse return her call. Cb# [phone]

## 2020-02-07 DIAGNOSIS — M5416 Radiculopathy, lumbar region: Secondary | ICD-10-CM | POA: Diagnosis not present

## 2020-02-07 DIAGNOSIS — R262 Difficulty in walking, not elsewhere classified: Secondary | ICD-10-CM | POA: Diagnosis not present

## 2020-02-07 DIAGNOSIS — M6281 Muscle weakness (generalized): Secondary | ICD-10-CM | POA: Diagnosis not present

## 2020-02-07 DIAGNOSIS — Z741 Need for assistance with personal care: Secondary | ICD-10-CM | POA: Diagnosis not present

## 2020-02-07 DIAGNOSIS — M48062 Spinal stenosis, lumbar region with neurogenic claudication: Secondary | ICD-10-CM | POA: Diagnosis not present

## 2020-02-07 NOTE — Telephone Encounter (Signed)
This is the 4th call where patient did not answer and does not have vm. Will close message for now.

## 2020-02-07 NOTE — Telephone Encounter (Signed)
No answer and no vm. Will try calling pt later.

## 2020-02-13 ENCOUNTER — Ambulatory Visit: Payer: Medicare Other | Admitting: Adult Health

## 2020-02-14 NOTE — Progress Notes (Signed)
Subjective:   Renee Carpenter is a 86 y.o. female who presents for Medicare Annual (Subsequent) preventive examination.  I connected with Renee Carpenter today by telephone and verified that I am speaking with the correct person using two identifiers. Location patient: home Location provider: work Persons participating in the virtual visit: patient, provider.   I discussed the limitations, risks, security and privacy concerns of performing an evaluation and management service by telephone and the availability of in person appointments. I also discussed with the patient that there may be a patient responsible charge related to this service. The patient expressed understanding and verbally consented to this telephonic visit.    Interactive audio and video telecommunications were attempted between this provider and patient, however failed, due to patient having technical difficulties OR patient did not have access to video capability.  We continued and completed visit with audio only.   Review of Systems    N/A  Cardiac Risk Factors include: advanced age (>71men, >32 women);diabetes mellitus;hypertension     Objective:    There were no vitals filed for this visit. There is no height or weight on file to calculate BMI.  Advanced Directives 02/18/2020 02/08/2019 02/27/2018 08/24/2017 01/19/2017 04/20/2016 04/07/2016  Does Patient Have a Medical Advance Directive? Yes Yes Yes Yes Yes Yes Yes  Type of Paramedic of [place];Living will [place];Living will Healthcare Power of [place];Living will [place];Living will Living will;Healthcare Power of [place];Living will  Copy of [place] in Chart? No - copy requested No - copy requested - No - copy requested No - copy requested No - copy requested -  Pre-existing out of facility DNR order (yellow form or  pink MOST form) - - - - - - -    Current Medications (verified) Outpatient Encounter Medications as of 02/18/2020  Medication Sig  . Accu-Chek FastClix Lancets MISC USE TO TEST BLOOD SUGAR UP TO 4 TIMES DAILY AS DIRECTED  . ACCU-CHEK GUIDE test strip USE TO TEST BLOOD SUGAR UP TO 4 X DAILY  . acetaminophen (TYLENOL) 500 MG tablet Take 500 mg by mouth every 6 (six) hours as needed.  Marland Kitchen albuterol (VENTOLIN HFA) 108 (90 Base) MCG/ACT inhaler Inhale 2 puffs into the lungs every 4 (four) hours as needed for wheezing or shortness of breath.  . ALPHA LIPOIC ACID PO Take by mouth daily.  Marland Kitchen aspirin EC 81 MG tablet Take 81 mg by mouth daily.   . blood glucose meter kit and supplies KIT Dispense based on patient and insurance preference. Use up to four times daily as directed. (FOR ICD-9 250.00, 250.01).  . Coenzyme Q10 (CO Q10) 200 MG CAPS Take by mouth daily.   . DULoxetine (CYMBALTA) 30 MG capsule Take 1 capsule (30 mg total) by mouth daily.  Marland Kitchen esomeprazole (NEXIUM) 40 MG capsule TAKE 1 CAPSULE BY MOUTH EVERY DAY  . gabapentin (NEURONTIN) 600 MG tablet Take 1 tablet (600 mg total) by mouth 3 (three) times daily.  Marland Kitchen glimepiride (AMARYL) 4 MG tablet TAKE 1 TABLET(4 MG) BY MOUTH DAILY BEFORE BREAKFAST  . MAGNESIUM-OXIDE 400 (241.3 Mg) MG tablet TAKE 1 TABLET BY MOUTH TWICE DAILY  . MULTIPLE VITAMIN PO Take by mouth daily.   . pregabalin (LYRICA) 25 MG capsule Take 1 capsule (25 mg total) by mouth 2 (two) times daily.  . ropinirole (REQUIP) 5 MG tablet Take 5 mg by mouth in the  morning, at noon, in the evening, and at bedtime.  . tamsulosin (FLOMAX) 0.4 MG CAPS capsule TAKE 1 CAPSULE(0.4 MG) BY MOUTH DAILY  . hydroxypropyl methylcellulose (ISOPTO TEARS) 2.5 % ophthalmic solution Place 1 drop into both eyes 3 (three) times daily as needed. Dry eyes  (Patient not taking: Reported on 02/18/2020)  . losartan (COZAAR) 25 MG tablet TAKE 1 TABLET BY MOUTH EVERY DAY (Patient not taking: Reported on 02/18/2020)  .  predniSONE (STERAPRED UNI-PAK 21 TAB) 10 MG (21) TBPK tablet Take as per directed (Patient not taking: Reported on 02/18/2020)  . rOPINIRole (REQUIP) 1 MG tablet Take 1 mg by mouth 3 (three) times daily.  (Patient not taking: Reported on 02/18/2020)  . senna-docusate (SENOKOT-S) 8.6-50 MG tablet Take 1 tablet by mouth daily as needed. (Patient not taking: Reported on 02/18/2020)   No facility-administered encounter medications on file as of 02/18/2020.    Allergies (verified) Acetaminophen, Aspirin, Metformin and related, Other, Sulfa antibiotics, Sulfasalazine, Tizanidine, Caffeine, Ginger, and Ibuprofen   History: Past Medical History:  Diagnosis Date  . Anemia    WAS ANEMIC  . Arthritis   . Blood transfusion    HAD 1 SEVERAL YRS AGO...3-4 YR  . Chronic kidney disease    SOME DECREASE IN KIDNEY FUNCTION  . Diabetes mellitus without complication ([place])   . Hypertension   . Normal cardiac stress test    REQUESTING COPY FROM [facility]  . Normal echocardiogram    REQUESTING THAT RESULT -- DR [name]   . PONV (postoperative nausea and vomiting)   . Shortness of breath   . Spondylolysis 1998   Past Surgical History:  Procedure Laterality Date  . ABDOMINAL HYSTERECTOMY    . BACK SURGERY     2 CERVICAL SURGERIES ALSO  . CATARACTS     RIGHT EYE CATARACT  . CERVICAL DISCECTOMY  08/1995 and 2011   C4-,C5-6, C6-7 anterior cervical; Dr. Ellene Route fusion and plating  . LAMINECTOMY     decompressive; Dr. Burman Riis in [place] MICRODISCECTOMY  04/14/2011   Procedure: LUMBAR LAMINECTOMY/DECOMPRESSION MICRODISCECTOMY;  Surgeon: [name];  Location: [facility];  Service: Neurosurgery;  Laterality: N/A;  Lumbar Three and Lumbar Four Laminectomy  . TONSILLECTOMY AND ADENOIDECTOMY  1939   Family History  Problem Relation Age of Onset  . Heart failure Father   . Heart disease Father   . Hypertension Mother   . Diabetes Mother   . Heart disease Mother     . Cancer Sister        breast  . Breast cancer Sister 16  . Cancer Maternal Grandfather   . Cancer Maternal Aunt        x3  . Breast cancer Daughter 30   Social History   Socioeconomic History  . Marital status: Widowed    Spouse name: Not on file  . Number of children: 1  . Years of education: Not on file  . Highest education level: Bachelor's degree (e.g., BA, AB, BS)  Occupational History  . Occupation: retired  Tobacco Use  . Smoking status: Never Smoker  . Smokeless tobacco: Never Used  Substance and Sexual Activity  . Alcohol use: No  . Drug use: No  . Sexual activity: Never  Other Topics Concern  . Not on file  Social History Narrative  . Not on file   Social Determinants of Health   Financial Resource Strain: Low Risk   . Difficulty of Paying Living Expenses: Not hard  at all  Food Insecurity: No Food Insecurity  . Worried About Charity fundraiser in the Last Year: Never true  . Ran Out of Food in the Last Year: Never true  Transportation Needs: No Transportation Needs  . Lack of Transportation (Medical): No  . Lack of Transportation (Non-Medical): No  Physical Activity: Insufficiently Active  . Days of Exercise per Week: 1 day  . Minutes of Exercise per Session: 20 min  Stress: Stress Concern Present  . Feeling of Stress : Rather much  Social Connections: Moderately Isolated  . Frequency of Communication with Friends and Family: Three times a week  . Frequency of Social Gatherings with Friends and Family: More than three times a week  . Attends Religious Services: Never  . Active Member of Clubs or Organizations: Yes  . Attends Archivist Meetings: More than 4 times per year  . Marital Status: Widowed    Tobacco Counseling Counseling given: Not Answered   Clinical Intake:  Pre-visit preparation completed: Yes  Pain : No/denies pain     Nutritional Risks: Nausea/ vomitting/ diarrhea (Diarrhea frequently due to diet.) Diabetes:  Yes  How often do you need to have someone help you when you read instructions, pamphlets, or other written materials from your doctor or pharmacy?: 1 - Never  Diabetic? Yes  Nutrition Risk Assessment:  Has the patient had any N/V/D within the last 2 months?  No  Does the patient have any non-healing wounds?  No  Has the patient had any unintentional weight loss or weight gain?  No   Diabetes:  Is the patient diabetic?  Yes  If diabetic, was a CBG obtained today?  No  Did the patient bring in their glucometer from home?  No  How often do you monitor your CBG's? Not often.   Financial Strains and Diabetes Management:  Are you having any financial strains with the device, your supplies or your medication? No .  Does the patient want to be seen by Chronic Care Management for management of their diabetes?  No  Would the patient like to be referred to a Nutritionist or for Diabetic Management?  No   Diabetic Exams:  Diabetic Eye Exam: Overdue for diabetic eye exam. Pt has been advised about the importance in completing this exam. Patient advised to call and schedule an eye exam. Diabetic Foot Exam: Overdue, Pt has been advised about the importance in completing this exam. Note made to follow up on this at next in office apt.   Interpreter Needed?: No  Information entered by :: [facility], LPN   Activities of Daily Living In your present state of health, do you have any difficulty performing the following activities: 02/18/2020  Hearing? Y  Comment Has bilateral hearing aids.  Vision? N  Difficulty concentrating or making decisions? N  Walking or climbing stairs? Y  Comment Unable to walk.  Dressing or bathing? N  Doing errands, shopping? Y  Comment Does not drive.  Preparing Food and eating ? Y  Comment Has all meals prepared at [facility].  Using the Toilet? N  In the past six months, have you accidently leaked urine? N  Do you have problems with loss of bowel  control? N  Managing your Medications? N  Managing your Finances? N  Housekeeping or managing your Housekeeping? Y  Comment Has assistance cleaning room.  Some recent data might be hidden    Patient Care Team: Jerrol Banana., MD as PCP -  General (Unknown Physician Specialty) Vladimir Crofts, MD as Consulting Physician (Neurology) Troxler, Adele Schilder as Attending Physician (Podiatry) Jenne Campus, MD as Referring Physician (Neurosurgery) Ralene Bathe, MD as Consulting Physician (Dermatology) Leandrew Koyanagi, MD as Referring Physician (Ophthalmology) Kris Hartmann, NP as Nurse Practitioner (Vascular Surgery)  Indicate any recent Medical Services you may have received from other than [facility] providers in the past year (date may be approximate).     Assessment:   This is a routine wellness examination for Renee Carpenter.  Hearing/Vision screen No exam data present  Dietary issues and exercise activities discussed: Current Exercise Habits: Home exercise routine, Type of exercise: walking, Time (Minutes): 20, Frequency (Times/Week): 1, Weekly Exercise (Minutes/Week): 20, Intensity: Mild, Exercise limited by: orthopedic condition(s)  Goals    . Exercise 3x per week (30 min per time)     Recommend to exercise for 3 days a week for at least 30 minutes at a time.     . Increase water intake     Starting 04/20/16, I will increase my water intake to 4 glasses a day.      Depression Screen PHQ 2/9 Scores 01/17/2020 02/08/2019 08/24/2017 05/23/2017 01/19/2017 04/20/2016 08/27/2015  PHQ - 2 Score 2 0 $R'1 2 1 1 'LI$ 0  PHQ- 9 Score 9 - - 3 - - -    Fall Risk Fall Risk  02/18/2020 02/08/2019 08/24/2017 01/19/2017 04/20/2016  Falls in the past year? 0 0 Yes No Yes  Number falls in past yr: 0 - 2 or more - 1  Injury with Fall? 0 - No - No  Risk for fall due to : Impaired mobility;Impaired balance/gait - - - Impaired balance/gait  Follow up - - Falls prevention discussed - -    Any stairs  in or around the home? No  If so, are there any without handrails? No  Home free of loose throw rugs in walkways, pet beds, electrical cords, etc? Yes  Adequate lighting in your home to reduce risk of falls? Yes   ASSISTIVE DEVICES UTILIZED TO PREVENT FALLS:  Life alert? Yes  Use of a cane, walker or w/c? Yes  Grab bars in the bathroom? Yes  Shower chair or bench in shower? Yes  Elevated toilet seat or a handicapped toilet? No     Cognitive Function:     6CIT Screen 02/18/2020 02/08/2019 01/19/2017 04/20/2016  What Year? 0 points 0 points 0 points 0 points  What month? 0 points 0 points 0 points 0 points  What time? 0 points 0 points 0 points 0 points  Count back from 20 0 points 0 points 0 points 0 points  Months in reverse 2 points 0 points 0 points 0 points  Repeat phrase 0 points 0 points 0 points 0 points  Total Score 2 0 0 0    Immunizations Immunization History  Administered Date(s) Administered  . Fluad Quad(high Dose 65+) 03/13/2019  . Influenza, High Dose Seasonal PF 04/29/2015, 04/20/2016, 03/24/2017, 05/01/2018  . Pneumococcal Conjugate-13 04/29/2015  . Tdap 05/02/2019    TDAP status: Up to date Flu Vaccine status: Declined, Education has been provided regarding the importance of this vaccine but patient still declined. Advised may receive this vaccine at local pharmacy or Health Dept. Aware to provide a copy of the vaccination record if obtained from local pharmacy or Health Dept. Verbalized acceptance and understanding. Pneumococcal vaccine status: Up to date Covid-19 vaccine status: Completed vaccines  Qualifies for Shingles Vaccine? Yes   Zostavax completed  No   Shingrix Completed?: No.    Education has been provided regarding the importance of this vaccine. Patient has been advised to call insurance company to determine out of pocket expense if they have not yet received this vaccine. Advised may also receive vaccine at local pharmacy or Health Dept.  Verbalized acceptance and understanding.  Screening Tests Health Maintenance  Topic Date Due  . COVID-19 Vaccine (1) Never done  . FOOT EXAM  05/23/2018  . OPHTHALMOLOGY EXAM  08/31/2018  . INFLUENZA VACCINE  01/06/2020  . HEMOGLOBIN A1C  07/19/2020  . TETANUS/TDAP  05/01/2029  . DEXA SCAN  Completed  . PNA vac Low Risk Adult  Completed    Health Maintenance  Health Maintenance Due  Topic Date Due  . COVID-19 Vaccine (1) Never done  . FOOT EXAM  05/23/2018  . OPHTHALMOLOGY EXAM  08/31/2018  . INFLUENZA VACCINE  01/06/2020    Colorectal cancer screening: No longer required.  Mammogram status: No longer required.  Bone Density status: Completed 02/27/19. Results reflect: Previous DEXA scan was normal. No repeat needed unless advised by a physician.  Lung Cancer Screening: (Low Dose CT Chest recommended if Age 5-80 years, 30 pack-year currently smoking OR have quit w/in 15years.) does not qualify.    Additional Screening:  Vision Screening: Recommended annual ophthalmology exams for early detection of glaucoma and other disorders of the eye. Is the patient up to date with their annual eye exam?  Yes  Who is the provider or what is the name of the office in which the patient attends annual eye exams? [facility] If pt is not established with a provider, would they like to be referred to a provider to establish care? No .   Dental Screening: Recommended annual dental exams for proper oral hygiene  Community Resource Referral / Chronic Care Management: CRR required this visit?  No   CCM required this visit?  No      Plan:     I have personally reviewed and noted the following in the patient's chart:   . Medical and social history . Use of alcohol, tobacco or illicit drugs  . Current medications and supplements . Functional ability and status . Nutritional status . Physical activity . Advanced directives . List of other physicians . Hospitalizations,  surgeries, and ER visits in previous 12 months . Vitals . Screenings to include cognitive, depression, and falls . Referrals and appointments  In addition, I have reviewed and discussed with patient certain preventive protocols, quality metrics, and best practice recommendations. A written personalized care plan for preventive services as well as general preventive health recommendations were provided to patient.     Nashya Garlington [place], [place]   [id number]/[id number]   Nurse Notes: Pt needs a diabetic foot exam and flu shot at next in office apt. Pt plans to schedule an eye exam this year. Advised pt to bring in her Covid vaccine card to next in office apt.

## 2020-02-18 ENCOUNTER — Other Ambulatory Visit: Payer: Self-pay

## 2020-02-18 ENCOUNTER — Ambulatory Visit (INDEPENDENT_AMBULATORY_CARE_PROVIDER_SITE_OTHER): Payer: Medicare PPO

## 2020-02-18 DIAGNOSIS — Z0000 Encounter for general adult medical examination without abnormal findings: Secondary | ICD-10-CM | POA: Diagnosis not present

## 2020-02-18 NOTE — Patient Instructions (Signed)
Renee Carpenter , Thank you for taking time to come for your Medicare Wellness Visit. I appreciate your ongoing commitment to your health goals. Please review the following plan we discussed and let me know if I can assist you in the future.   Screening recommendations/referrals: Colonoscopy: No longer required.  Mammogram: No longer required.  Bone Density: Previous DEXA scan was normal. No repeat needed unless advised by a physician. Recommended yearly ophthalmology/optometry visit for glaucoma screening and checkup Recommended yearly dental visit for hygiene and checkup  Vaccinations: Influenza vaccine: Currently due. Pneumococcal vaccine: Completed series Tdap vaccine: Up to date, due 04/2029 Shingles vaccine: Shingrix discussed. Please contact your pharmacy for coverage information.     Advanced directives: Please bring a copy of your POA (Power of Attorney) and/or Living Will to your next appointment.   Conditions/risks identified: Recommend to start walking 3 days a week for at least 30 minutes at a time.  Next appointment: 04/23/20 @ 2:20 PM with Dr Sullivan Lone    Preventive Care 65 Years and Older, Female Preventive care refers to lifestyle choices and visits with your health care provider that can promote health and wellness. What does preventive care include?  A yearly physical exam. This is also called an annual well check.  Dental exams once or twice a year.  Routine eye exams. Ask your health care provider how often you should have your eyes checked.  Personal lifestyle choices, including:  Daily care of your teeth and gums.  Regular physical activity.  Eating a healthy diet.  Avoiding tobacco and drug use.  Limiting alcohol use.  Practicing safe sex.  Taking low-dose aspirin every day.  Taking vitamin and mineral supplements as recommended by your health care provider. What happens during an annual well check? The services and screenings done by your  health care provider during your annual well check will depend on your age, overall health, lifestyle risk factors, and family history of disease. Counseling  Your health care provider may ask you questions about your:  Alcohol use.  Tobacco use.  Drug use.  Emotional well-being.  Home and relationship well-being.  Sexual activity.  Eating habits.  History of falls.  Memory and ability to understand (cognition).  Work and work Astronomer.  Reproductive health. Screening  You may have the following tests or measurements:  Height, weight, and BMI.  Blood pressure.  Lipid and cholesterol levels. These may be checked every 5 years, or more frequently if you are over 40 years old.  Skin check.  Lung cancer screening. You may have this screening every year starting at age 53 if you have a 30-pack-year history of smoking and currently smoke or have quit within the past 15 years.  Fecal occult blood test (FOBT) of the stool. You may have this test every year starting at age 63.  Flexible sigmoidoscopy or colonoscopy. You may have a sigmoidoscopy every 5 years or a colonoscopy every 10 years starting at age 104.  Hepatitis C blood test.  Hepatitis B blood test.  Sexually transmitted disease (STD) testing.  Diabetes screening. This is done by checking your blood sugar (glucose) after you have not eaten for a while (fasting). You may have this done every 1-3 years.  Bone density scan. This is done to screen for osteoporosis. You may have this done starting at age 81.  Mammogram. This may be done every 1-2 years. Talk to your health care provider about how often you should have regular mammograms. Talk with your  health care provider about your test results, treatment options, and if necessary, the need for more tests. Vaccines  Your health care provider may recommend certain vaccines, such as:  Influenza vaccine. This is recommended every year.  Tetanus, diphtheria, and  acellular pertussis (Tdap, Td) vaccine. You may need a Td booster every 10 years.  Zoster vaccine. You may need this after age 58.  Pneumococcal 13-valent conjugate (PCV13) vaccine. One dose is recommended after age 53.  Pneumococcal polysaccharide (PPSV23) vaccine. One dose is recommended after age 84. Talk to your health care provider about which screenings and vaccines you need and how often you need them. This information is not intended to replace advice given to you by your health care provider. Make sure you discuss any questions you have with your health care provider. Document Released: 06/20/2015 Document Revised: 02/11/2016 Document Reviewed: 03/25/2015 Elsevier Interactive Patient Education  2017 [employer] Prevention in the Home Falls can cause injuries. They can happen to people of all ages. There are many things you can do to make your home safe and to help prevent falls. What can I do on the outside of my home?  Regularly fix the edges of walkways and driveways and fix any cracks.  Remove anything that might make you trip as you walk through a door, such as a raised step or threshold.  Trim any bushes or trees on the path to your home.  Use bright outdoor lighting.  Clear any walking paths of anything that might make someone trip, such as rocks or tools.  Regularly check to see if handrails are loose or broken. Make sure that both sides of any steps have handrails.  Any raised decks and porches should have guardrails on the edges.  Have any leaves, snow, or ice cleared regularly.  Use sand or salt on walking paths during winter.  Clean up any spills in your garage right away. This includes oil or grease spills. What can I do in the bathroom?  Use night lights.  Install grab bars by the toilet and in the tub and shower. Do not use towel bars as grab bars.  Use non-skid mats or decals in the tub or shower.  If you need to sit down in the shower, use  a plastic, non-slip stool.  Keep the floor dry. Clean up any water that spills on the floor as soon as it happens.  Remove soap buildup in the tub or shower regularly.  Attach bath mats securely with double-sided non-slip rug tape.  Do not have throw rugs and other things on the floor that can make you trip. What can I do in the bedroom?  Use night lights.  Make sure that you have a light by your bed that is easy to reach.  Do not use any sheets or blankets that are too big for your bed. They should not hang down onto the floor.  Have a firm chair that has side arms. You can use this for support while you get dressed.  Do not have throw rugs and other things on the floor that can make you trip. What can I do in the kitchen?  Clean up any spills right away.  Avoid walking on wet floors.  Keep items that you use a lot in easy-to-reach places.  If you need to reach something above you, use a strong step stool that has a grab bar.  Keep electrical cords out of the way.  Do not use  floor polish or wax that makes floors slippery. If you must use wax, use non-skid floor wax.  Do not have throw rugs and other things on the floor that can make you trip. What can I do with my stairs?  Do not leave any items on the stairs.  Make sure that there are handrails on both sides of the stairs and use them. Fix handrails that are broken or loose. Make sure that handrails are as long as the stairways.  Check any carpeting to make sure that it is firmly attached to the stairs. Fix any carpet that is loose or worn.  Avoid having throw rugs at the top or bottom of the stairs. If you do have throw rugs, attach them to the floor with carpet tape.  Make sure that you have a light switch at the top of the stairs and the bottom of the stairs. If you do not have them, ask someone to add them for you. What else can I do to help prevent falls?  Wear shoes that:  Do not have high heels.  Have  rubber bottoms.  Are comfortable and fit you well.  Are closed at the toe. Do not wear sandals.  If you use a stepladder:  Make sure that it is fully opened. Do not climb a closed stepladder.  Make sure that both sides of the stepladder are locked into place.  Ask someone to hold it for you, if possible.  Clearly mark and make sure that you can see:  Any grab bars or handrails.  First and last steps.  Where the edge of each step is.  Use tools that help you move around (mobility aids) if they are needed. These include:  Canes.  Walkers.  Scooters.  Crutches.  Turn on the lights when you go into a dark area. Replace any light bulbs as soon as they burn out.  Set up your furniture so you have a clear path. Avoid moving your furniture around.  If any of your floors are uneven, fix them.  If there are any pets around you, be aware of where they are.  Review your medicines with your doctor. Some medicines can make you feel dizzy. This can increase your chance of falling. Ask your doctor what other things that you can do to help prevent falls. This information is not intended to replace advice given to you by your health care provider. Make sure you discuss any questions you have with your health care provider. Document Released: 03/20/2009 Document Revised: 10/30/2015 Document Reviewed: 06/28/2014 Elsevier Interactive Patient Education  2017 [employer].

## 2020-02-22 DIAGNOSIS — L570 Actinic keratosis: Secondary | ICD-10-CM | POA: Diagnosis not present

## 2020-03-12 ENCOUNTER — Ambulatory Visit (INDEPENDENT_AMBULATORY_CARE_PROVIDER_SITE_OTHER): Payer: Medicare PPO | Admitting: Pulmonary Disease

## 2020-03-12 ENCOUNTER — Encounter: Payer: Self-pay | Admitting: Pulmonary Disease

## 2020-03-12 ENCOUNTER — Other Ambulatory Visit: Payer: Self-pay

## 2020-03-12 ENCOUNTER — Telehealth: Payer: Self-pay | Admitting: Family Medicine

## 2020-03-12 VITALS — BP 122/74 | HR 70 | Temp 97.3°F | Ht 65.0 in | Wt 170.0 lb

## 2020-03-12 DIAGNOSIS — G4733 Obstructive sleep apnea (adult) (pediatric): Secondary | ICD-10-CM

## 2020-03-12 NOTE — Progress Notes (Signed)
Renee Carpenter    [id number]    05-21-1926  Primary Care Physician:Gilbert, Retia Passe., MD  Referring Physician: Jerrol Banana., MD [address] [place],  [place] [address]  Chief complaint:   Patient with a history of significant snoring, witnessed apneas  HPI:  Patient's caregiver has noticed significant snoring, witnessed apneas Multiple occasions of coming in to check on her thinking she is not bleeding at all  She wakes up tired, cannot fall asleep at any time  Been evaluated and treated for parkinsonism and tremors  Usually goes to bed between 10 and 1230, takes a few minutes to fall asleep About 2 awakenings usually to use the bathroom Final wake up time about 9 AM  Weight has been stable  She does deny headaches in the morning, denies any significant dryness of the mouth  No significant family history of sleep disordered breathing  Does not smoke, does not drink alcohol  Outpatient Encounter Medications as of 03/12/2020  Medication Sig  . Accu-Chek FastClix Lancets MISC USE TO TEST BLOOD SUGAR UP TO 4 TIMES DAILY AS DIRECTED  . ACCU-CHEK GUIDE test strip USE TO TEST BLOOD SUGAR UP TO 4 X DAILY  . acetaminophen (TYLENOL) 500 MG tablet Take 500 mg by mouth every 6 (six) hours as needed.  Marland Kitchen albuterol (VENTOLIN HFA) 108 (90 Base) MCG/ACT inhaler Inhale 2 puffs into the lungs every 4 (four) hours as needed for wheezing or shortness of breath.  . ALPHA LIPOIC ACID PO Take by mouth daily.  Marland Kitchen aspirin EC 81 MG tablet Take 81 mg by mouth daily.   . blood glucose meter kit and supplies KIT Dispense based on patient and insurance preference. Use up to four times daily as directed. (FOR ICD-9 250.00, 250.01).  . Coenzyme Q10 (CO Q10) 200 MG CAPS Take by mouth daily.   . DULoxetine (CYMBALTA) 30 MG capsule Take 1 capsule (30 mg total) by mouth daily.  Marland Kitchen esomeprazole (NEXIUM) 40 MG capsule TAKE 1 CAPSULE BY MOUTH EVERY DAY  . gabapentin  (NEURONTIN) 600 MG tablet Take 1 tablet (600 mg total) by mouth 3 (three) times daily.  Marland Kitchen glimepiride (AMARYL) 4 MG tablet TAKE 1 TABLET(4 MG) BY MOUTH DAILY BEFORE BREAKFAST  . hydroxypropyl methylcellulose (ISOPTO TEARS) 2.5 % ophthalmic solution Place 1 drop into both eyes 3 (three) times daily as needed. Dry eyes   . losartan (COZAAR) 25 MG tablet TAKE 1 TABLET BY MOUTH EVERY DAY  . MAGNESIUM-OXIDE 400 (241.3 Mg) MG tablet TAKE 1 TABLET BY MOUTH TWICE DAILY  . MULTIPLE VITAMIN PO Take by mouth daily.   . predniSONE (STERAPRED UNI-PAK 21 TAB) 10 MG (21) TBPK tablet Take as per directed  . pregabalin (LYRICA) 25 MG capsule Take 1 capsule (25 mg total) by mouth 2 (two) times daily.  Marland Kitchen rOPINIRole (REQUIP) 1 MG tablet Take 1 mg by mouth 3 (three) times daily.   . ropinirole (REQUIP) 5 MG tablet Take 5 mg by mouth in the morning, at noon, in the evening, and at bedtime.  . senna-docusate (SENOKOT-S) 8.6-50 MG tablet Take 1 tablet by mouth daily as needed.   . tamsulosin (FLOMAX) 0.4 MG CAPS capsule TAKE 1 CAPSULE(0.4 MG) BY MOUTH DAILY   No facility-administered encounter medications on file as of 03/12/2020.    Allergies as of 03/12/2020 - Review Complete 03/12/2020  Allergen Reaction Noted  . Acetaminophen Itching and Other (See Comments)   .  Aspirin Itching and Other (See Comments) 04/06/2011  . Metformin and related  06/17/2017  . Other Other (See Comments) 04/06/2011  . Sulfa antibiotics Other (See Comments) 01/01/2015  . Sulfasalazine  01/01/2015  . Tizanidine  06/18/2016  . Caffeine Palpitations 04/29/2015  . Ginger Palpitations 04/29/2015  . Ibuprofen Rash 11/13/2013    Past Medical History:  Diagnosis Date  . Anemia    WAS ANEMIC  . Arthritis   . Blood transfusion    HAD 1 SEVERAL YRS AGO...3-4 YR  . Chronic kidney disease    SOME DECREASE IN KIDNEY FUNCTION  . Diabetes mellitus without complication ([place])   . Hypertension   . Normal cardiac stress test    REQUESTING  COPY FROM [facility]  . Normal echocardiogram    REQUESTING THAT RESULT -- DR [name]   . PONV (postoperative nausea and vomiting)   . Shortness of breath   . Spondylolysis 1998    Past Surgical History:  Procedure Laterality Date  . ABDOMINAL HYSTERECTOMY    . BACK SURGERY     2 CERVICAL SURGERIES ALSO  . CATARACTS     RIGHT EYE CATARACT  . CERVICAL DISCECTOMY  08/1995 and 2011   C4-,C5-6, C6-7 anterior cervical; Dr. Ellene Route fusion and plating  . LAMINECTOMY     decompressive; Dr. Burman Riis in [place] MICRODISCECTOMY  04/14/2011   Procedure: LUMBAR LAMINECTOMY/DECOMPRESSION MICRODISCECTOMY;  Surgeon: [name];  Location: [facility];  Service: Neurosurgery;  Laterality: N/A;  Lumbar Three and Lumbar Four Laminectomy  . TONSILLECTOMY AND ADENOIDECTOMY  1939    Family History  Problem Relation Age of Onset  . Heart failure Father   . Heart disease Father   . Hypertension Mother   . Diabetes Mother   . Heart disease Mother   . Cancer Sister        breast  . Breast cancer Sister 78  . Cancer Maternal Grandfather   . Cancer Maternal Aunt        x3  . Breast cancer Daughter 55    Social History   Socioeconomic History  . Marital status: Widowed    Spouse name: Not on file  . Number of children: 1  . Years of education: Not on file  . Highest education level: Bachelor's degree (e.g., BA, AB, BS)  Occupational History  . Occupation: retired  Tobacco Use  . Smoking status: Never Smoker  . Smokeless tobacco: Never Used  Substance and Sexual Activity  . Alcohol use: No  . Drug use: No  . Sexual activity: Never  Other Topics Concern  . Not on file  Social History Narrative  . Not on file   Social Determinants of Health   Financial Resource Strain: Low Risk   . Difficulty of Paying Living Expenses: Not hard at all  Food Insecurity: No Food Insecurity  . Worried About Charity fundraiser in the Last Year: Never true  . Ran  Out of Food in the Last Year: Never true  Transportation Needs: No Transportation Needs  . Lack of Transportation (Medical): No  . Lack of Transportation (Non-Medical): No  Physical Activity: Insufficiently Active  . Days of Exercise per Week: 1 day  . Minutes of Exercise per Session: 20 min  Stress: Stress Concern Present  . Feeling of Stress : Rather much  Social Connections: Moderately Isolated  . Frequency of Communication with Friends and Family: Three times a week  . Frequency of Social Gatherings with  Friends and Family: More than three times a week  . Attends Religious Services: Never  . Active Member of Clubs or Organizations: Yes  . Attends Archivist Meetings: More than 4 times per year  . Marital Status: Widowed  Intimate Partner Violence: Not At Risk  . Fear of Current or Ex-Partner: No  . Emotionally Abused: No  . Physically Abused: No  . Sexually Abused: No    Review of Systems  Constitutional: Positive for fatigue.  Respiratory: Positive for apnea.   Psychiatric/Behavioral: Positive for sleep disturbance.    There were no vitals filed for this visit.   Physical Exam Constitutional:      Appearance: Normal appearance.  HENT:     Nose: No congestion.     Mouth/Throat:     Mouth: Mucous membranes are moist.     Comments: Crowded oropharynx, Mallampati 2 Eyes:     General:        Right eye: No discharge.        Left eye: No discharge.  Cardiovascular:     Rate and Rhythm: Normal rate and regular rhythm.     Pulses: Normal pulses.     Heart sounds: Normal heart sounds. No murmur heard.  No friction rub.  Pulmonary:     Effort: Pulmonary effort is normal. No respiratory distress.     Breath sounds: No stridor. No wheezing or rhonchi.  Musculoskeletal:     Cervical back: No rigidity or tenderness.  Neurological:     Mental Status: She is alert.  Psychiatric:        Mood and Affect: Mood normal.    Results of the Epworth flowsheet  03/12/2020 01/19/2017  Sitting and reading 2 3  Watching TV 3 2  Sitting, inactive in a public place (e.g. a theatre or a meeting) 0 1  As a passenger in a car for an hour without a break 3 3  Lying down to rest in the afternoon when circumstances permit 3 3  Sitting and talking to someone 0 0  Sitting quietly after a lunch without alcohol 3 1  In a car, while stopped for a few minutes in traffic 0 0  Total score 14 13     Data Reviewed:  Assessment:  High probability of significant obstructive sleep apnea -Has been witnessed to hold her breath for minutes at a stretch -Significant snoring  Excessive daytime sleepiness  Pathophysiology of sleep disordered breathing discussed with the patient Treatment options discussed with the patient  Plan/Recommendations: Risk of not treating sleep disordered breathing discussed with patient  We will schedule the patient for split-night study  Follow-up in 3 months   Sherrilyn Rist MD [facility] 03/12/2020, 11:08 AM  CC: Jerrol Banana.,*

## 2020-03-12 NOTE — Telephone Encounter (Signed)
Patient would like the nurse or doctor to call her regarding her medication for pregabalin (LYRICA) 25 MG capsule.  She believes it is causing her to have diarrhea and would like to discuss.  CB# [phone]

## 2020-03-12 NOTE — Patient Instructions (Signed)
High probability of significant obstructive sleep apnea  Excessive daytime sleepiness  We will schedule you for split-night study  Call with any significant concerns  Follow-up in 3 months  Sleep Apnea Sleep apnea affects breathing during sleep. It causes breathing to stop for a short time or to become shallow. It can also increase the risk of:  Heart attack.  Stroke.  Being very overweight (obese).  Diabetes.  Heart failure.  Irregular heartbeat. The goal of treatment is to help you breathe normally again. What are the causes? There are three kinds of sleep apnea:  Obstructive sleep apnea. This is caused by a blocked or collapsed airway.  Central sleep apnea. This happens when the brain does not send the right signals to the muscles that control breathing.  Mixed sleep apnea. This is a combination of obstructive and central sleep apnea. The most common cause of this condition is a collapsed or blocked airway. This can happen if:  Your throat muscles are too relaxed.  Your tongue and tonsils are too large.  You are overweight.  Your airway is too small. What increases the risk?  Being overweight.  Smoking.  Having a small airway.  Being older.  Being female.  Drinking alcohol.  Taking medicines to calm yourself (sedatives or tranquilizers).  Having family members with the condition. What are the signs or symptoms?  Trouble staying asleep.  Being sleepy or tired during the day.  Getting angry a lot.  Loud snoring.  Headaches in the morning.  Not being able to focus your mind (concentrate).  Forgetting things.  Less interest in sex.  Mood swings.  Personality changes.  Feelings of sadness (depression).  Waking up a lot during the night to pee (urinate).  Dry mouth.  Sore throat. How is this diagnosed?  Your medical history.  A physical exam.  A test that is done when you are sleeping (sleep study). The test is most often done  in a sleep lab but may also be done at home. How is this treated?   Sleeping on your side.  Using a medicine to get rid of mucus in your nose (decongestant).  Avoiding the use of alcohol, medicines to help you relax, or certain pain medicines (narcotics).  Losing weight, if needed.  Changing your diet.  Not smoking.  Using a machine to open your airway while you sleep, such as: ? An oral appliance. This is a mouthpiece that shifts your lower jaw forward. ? A CPAP device. This device blows air through a mask when you breathe out (exhale). ? An EPAP device. This has valves that you put in each nostril. ? A BPAP device. This device blows air through a mask when you breathe in (inhale) and breathe out.  Having surgery if other treatments do not work. It is important to get treatment for sleep apnea. Without treatment, it can lead to:  High blood pressure.  Coronary artery disease.  In men, not being able to have an erection (impotence).  Reduced thinking ability. Follow these instructions at home: Lifestyle  Make changes that your doctor recommends.  Eat a healthy diet.  Lose weight if needed.  Avoid alcohol, medicines to help you relax, and some pain medicines.  Do not use any products that contain nicotine or tobacco, such as cigarettes, e-cigarettes, and chewing tobacco. If you need help quitting, ask your doctor. General instructions  Take over-the-counter and prescription medicines only as told by your doctor.  If you were given a  machine to use while you sleep, use it only as told by your doctor.  If you are having surgery, make sure to tell your doctor you have sleep apnea. You may need to bring your device with you.  Keep all follow-up visits as told by your doctor. This is important. Contact a doctor if:  The machine that you were given to use during sleep bothers you or does not seem to be working.  You do not get better.  You get worse. Get help  right away if:  Your chest hurts.  You have trouble breathing in enough air.  You have an uncomfortable feeling in your back, arms, or stomach.  You have trouble talking.  One side of your body feels weak.  A part of your face is hanging down. These symptoms may be an emergency. Do not wait to see if the symptoms will go away. Get medical help right away. Call your local emergency services (911 in the [place] Do not drive yourself to the hospital. Summary  This condition affects breathing during sleep.  The most common cause is a collapsed or blocked airway.  The goal of treatment is to help you breathe normally while you sleep. This information is not intended to replace advice given to you by your health care provider. Make sure you discuss any questions you have with your health care provider. Document Revised: 03/10/2018 Document Reviewed: 01/17/2018 Elsevier Patient Education  2020 [employer].

## 2020-03-13 NOTE — Telephone Encounter (Signed)
LMOVM for pt to return call 

## 2020-04-01 ENCOUNTER — Other Ambulatory Visit: Payer: Self-pay | Admitting: *Deleted

## 2020-04-01 ENCOUNTER — Ambulatory Visit: Payer: Self-pay | Admitting: Family Medicine

## 2020-04-01 MED ORDER — GLIMEPIRIDE 4 MG PO TABS: ORAL_TABLET | ORAL | 1 refills | Status: DC

## 2020-04-01 NOTE — Telephone Encounter (Signed)
Pt reports diarrhea x 4 1/2 weeks, "After starting Pregabalin." Sent message 03/12/20 questioning whether to stop med, did not hear back (VM left 03/13/20) states stopped taking med. Reports diarrhea did ot resolve. Reports 2-3 times daily, loose "Grainy" not watery, no blood in stool. Denies any associated symptoms; no abdominal pain, no nausea or vomiting, no S/S dehydration, states "I drink a lot of water and eat ice." Reports urinating as usual, urine not dark in color. No known covid exposure. States took Pregabalin today for first time in "About 4 weeks." No antibiotic use, no other new meds. Unable to schedule due to covid screening. Called practice, advised to route for PCPs review., advised HP. Pt states will see any provider.Care advise given per protocol, pt verbalizes understanding.   Please advise:  CB# [phone] Reason for Disposition . [1] MILD diarrhea (e.g., 1-3 or more stools than normal in past 24 hours) without known cause AND [2] present >  7 days    Pt is 89.  Answer Assessment - Initial Assessment Questions 1. DIARRHEA SEVERITY: "How bad is the diarrhea?" "How many extra stools have you had in the past 24 hours than normal?"    - NO DIARRHEA (SCALE 0)   - MILD (SCALE 1-3): Few loose or mushy BMs; increase of 1-3 stools over normal daily number of stools; mild increase in ostomy output.   -  MODERATE (SCALE 4-7): Increase of 4-6 stools daily over normal; moderate increase in ostomy output. * SEVERE (SCALE 8-10; OR 'WORST POSSIBLE'): Increase of 7 or more stools daily over normal; moderate increase in ostomy output; incontinence.    2-3 times a day 2. ONSET: "When did the diarrhea begin?"      4 1/2 weeks ago 3. BM CONSISTENCY: "How loose or watery is the diarrhea?"      Loose, "Grainy" 4. VOMITING: "Are you also vomiting?" If Yes, ask: "How many times in the past 24 hours?"      No 5. ABDOMINAL PAIN: "Are you having any abdominal pain?" If Yes, ask: "What does it feel  like?" (e.g., crampy, dull, intermittent, constant)      no 6. ABDOMINAL PAIN SEVERITY: If present, ask: "How bad is the pain?"  (e.g., Scale 1-10; mild, moderate, or severe)   - MILD (1-3): doesn't interfere with normal activities, abdomen soft and not tender to touch    - MODERATE (4-7): interferes with normal activities or awakens from sleep, tender to touch    - SEVERE (8-10): excruciating pain, doubled over, unable to do any normal activities      NA 7. ORAL INTAKE: If vomiting, "Have you been able to drink liquids?" "How much fluids have you had in the past 24 hours?"     NA 8. HYDRATION: "Any signs of dehydration?" (e.g., dry mouth [not just dry lips], too weak to stand, dizziness, new weight loss) "When did you last urinate?"    No S/s. Staying hydrated 9. EXPOSURE: "Have you traveled to a foreign country recently?" "Have you been exposed to anyone with diarrhea?" "Could you have eaten any food that was spoiled?"     no 10. ANTIBIOTIC USE: "Are you taking antibiotics now or have you taken antibiotics in the past 2 months?"       no 11. OTHER SYMPTOMS: "Do you have any other symptoms?" (e.g., fever, blood in stool)      No, no covid exposure  Protocols used: DIARRHEA-A-AH

## 2020-04-01 NOTE — Telephone Encounter (Signed)
Please advise 

## 2020-04-02 ENCOUNTER — Telehealth: Payer: Self-pay | Admitting: Family Medicine

## 2020-04-02 NOTE — Telephone Encounter (Signed)
Try metamucil daily and probiotic daily then see me next week or 2 if persists.

## 2020-04-02 NOTE — Telephone Encounter (Signed)
Patient advised.

## 2020-04-02 NOTE — Telephone Encounter (Signed)
Pt reports diarrhea x 4 1/2 weeks, "After starting Pregabalin." Sent message 03/12/20 questioning whether to stop med, did not hear back (VM left 03/13/20) states stopped taking med. Reports diarrhea did ot resolve. Reports 2-3 times daily, loose "Grainy" not watery, no blood in stool. Denies any associated symptoms; no abdominal pain, no nausea or vomiting, no S/S dehydration, states "I drink a lot of water and eat ice." Reports urinating as usual, urine not dark in color. No known covid exposure. States took Pregabalin today for first time in "About 4 weeks." No antibiotic use, no other new meds. Unable to schedule due to covid screening. Called practice, advised to route for PCPs review., advised HP. Pt states will see any provider.Care advise given per protocol, pt verbalizes understanding.   Please advise:UPDATE  Copied TN from yesterday forward to dr Reece Agar. Pt has called this morning and states getting in a weakened state and needs a FU call pls advise at [phone]

## 2020-04-02 NOTE — Telephone Encounter (Signed)
See message from 04/01/2020.

## 2020-04-02 NOTE — Telephone Encounter (Signed)
Patient was advised. Patient stated she will try these medications and see if they help. If not she will callback and schedule an appt.

## 2020-04-17 ENCOUNTER — Ambulatory Visit (HOSPITAL_BASED_OUTPATIENT_CLINIC_OR_DEPARTMENT_OTHER): Payer: Medicare PPO | Attending: Pulmonary Disease | Admitting: Internal Medicine

## 2020-04-17 ENCOUNTER — Other Ambulatory Visit: Payer: Self-pay

## 2020-04-17 DIAGNOSIS — G4761 Periodic limb movement disorder: Secondary | ICD-10-CM | POA: Insufficient documentation

## 2020-04-17 DIAGNOSIS — G4733 Obstructive sleep apnea (adult) (pediatric): Secondary | ICD-10-CM | POA: Diagnosis not present

## 2020-04-23 ENCOUNTER — Other Ambulatory Visit: Payer: Self-pay

## 2020-04-23 ENCOUNTER — Ambulatory Visit (INDEPENDENT_AMBULATORY_CARE_PROVIDER_SITE_OTHER): Payer: Medicare PPO | Admitting: Family Medicine

## 2020-04-23 ENCOUNTER — Encounter: Payer: Self-pay | Admitting: Family Medicine

## 2020-04-23 VITALS — BP 110/59 | HR 90 | Temp 97.9°F | Resp 16 | Ht 65.0 in | Wt 169.0 lb

## 2020-04-23 DIAGNOSIS — G629 Polyneuropathy, unspecified: Secondary | ICD-10-CM | POA: Diagnosis not present

## 2020-04-23 DIAGNOSIS — M48062 Spinal stenosis, lumbar region with neurogenic claudication: Secondary | ICD-10-CM

## 2020-04-23 DIAGNOSIS — G4733 Obstructive sleep apnea (adult) (pediatric): Secondary | ICD-10-CM

## 2020-04-23 DIAGNOSIS — E1142 Type 2 diabetes mellitus with diabetic polyneuropathy: Secondary | ICD-10-CM | POA: Diagnosis not present

## 2020-04-23 DIAGNOSIS — Z23 Encounter for immunization: Secondary | ICD-10-CM

## 2020-04-23 DIAGNOSIS — I5022 Chronic systolic (congestive) heart failure: Secondary | ICD-10-CM | POA: Diagnosis not present

## 2020-04-23 DIAGNOSIS — I10 Essential (primary) hypertension: Secondary | ICD-10-CM

## 2020-04-23 DIAGNOSIS — M792 Neuralgia and neuritis, unspecified: Secondary | ICD-10-CM | POA: Diagnosis not present

## 2020-04-23 LAB — POCT GLYCOSYLATED HEMOGLOBIN (HGB A1C)
Est. average glucose Bld gHb Est-mCnc: 192
Hemoglobin A1C: 8.3 % — AB (ref 4.0–5.6)

## 2020-04-23 NOTE — Progress Notes (Signed)
I,Renee Carpenter,acting as a scribe for Renee Durie, MD.,have documented all relevant documentation on the behalf of Renee Durie, MD,as directed by  Renee Durie, MD while in the presence of Renee Durie, MD.   Established patient visit   Patient: Renee Carpenter   DOB: 1926-01-06   86 y.o. Female  MRN: [id number] Visit Date: 04/23/2020  Today's healthcare provider: [name], MD   Chief Complaint  Patient presents with  . Diabetes  . Follow-up  . Hypertension   Subjective    HPI  Overall patient is doing well. Sleep study is pending. Give her states that she is doing well and her diarrhea has resolved with probiotic and Metamucil. Her bowels are better. Diabetes Mellitus Type II, follow-up  Lab Results  Component Value Date   HGBA1C 8.3 (A) 04/23/2020   HGBA1C 8.8 (A) 01/17/2020   HGBA1C 9.1 (A) 10/10/2019   Last seen for diabetes 3 months ago.  Management since then includes continuing the same treatment. She reports good compliance with treatment. She is not having side effects. none  Home blood sugar records: fasting range: 137-200  Episodes of hypoglycemia? No none   Current insulin regiment: n/a Most Recent Eye Exam: due  --------------------------------------------------------------------  Hypertension, follow-up  BP Readings from Last 3 Encounters:  04/23/20 (!) 110/59  03/12/20 122/74  01/17/20 (!) 149/86   Wt Readings from Last 3 Encounters:  04/23/20 169 lb (76.7 kg)  04/17/20 170 lb (77.1 kg)  03/12/20 170 lb (77.1 kg)     She was last seen for hypertension 3 months ago.  BP at that visit was 149/86. Management since that visit includes; on losartan. She reports good compliance with treatment. She is not having side effects. none She is not exercising. She is not adherent to low salt diet.   Outside blood pressures are not checking.  She does not smoke.  Use of agents associated with hypertension:  none.   --------------------------------------------------------------------  Neuropathy ([place]) From 01/17/2020-Double Lyrica to twice daily.  Lymphedema From 01/17/2020-Ambulatory referral to Vascular Surgery.  Constipation, unspecified constipation type From 01/17/2020-Stop alpha lipoic acid and Senokot and try Metamucil daily.  Sleep apnea, unspecified type From 01/17/2020-I am not sure she has sleep apnea but home aide says she snores and has apneic spells.       Medications: Outpatient Medications Prior to Visit  Medication Sig  . Accu-Chek FastClix Lancets MISC USE TO TEST BLOOD SUGAR UP TO 4 TIMES DAILY AS DIRECTED  . ACCU-CHEK GUIDE test strip USE TO TEST BLOOD SUGAR UP TO 4 X DAILY  . acetaminophen (TYLENOL) 500 MG tablet Take 500 mg by mouth every 6 (six) hours as needed.  Marland Kitchen albuterol (VENTOLIN HFA) 108 (90 Base) MCG/ACT inhaler Inhale 2 puffs into the lungs every 4 (four) hours as needed for wheezing or shortness of breath.  Marland Kitchen aspirin EC 81 MG tablet Take 81 mg by mouth daily.   . blood glucose meter kit and supplies KIT Dispense based on patient and insurance preference. Use up to four times daily as directed. (FOR ICD-9 250.00, 250.01).  . Coenzyme Q10 (CO Q10) 200 MG CAPS Take by mouth daily.   . DULoxetine (CYMBALTA) 30 MG capsule Take 1 capsule (30 mg total) by mouth daily.  Marland Kitchen esomeprazole (NEXIUM) 40 MG capsule TAKE 1 CAPSULE BY MOUTH EVERY DAY  . gabapentin (NEURONTIN) 600 MG tablet Take 1 tablet (600 mg total) by mouth 3 (three) times daily.  Marland Kitchen  glimepiride (AMARYL) 4 MG tablet TAKE 1 TABLET(4 MG) BY MOUTH DAILY BEFORE BREAKFAST  . hydroxypropyl methylcellulose (ISOPTO TEARS) 2.5 % ophthalmic solution Place 1 drop into both eyes 3 (three) times daily as needed. Dry eyes   . MAGNESIUM-OXIDE 400 (241.3 Mg) MG tablet TAKE 1 TABLET BY MOUTH TWICE DAILY  . MULTIPLE VITAMIN PO Take by mouth daily.   . predniSONE (STERAPRED UNI-PAK 21 TAB) 10 MG (21) TBPK tablet Take as  per directed  . pregabalin (LYRICA) 25 MG capsule Take 1 capsule (25 mg total) by mouth 2 (two) times daily.  . ropinirole (REQUIP) 5 MG tablet Take 5 mg by mouth in the morning, at noon, in the evening, and at bedtime.  . senna-docusate (SENOKOT-S) 8.6-50 MG tablet Take 1 tablet by mouth daily as needed.   . tamsulosin (FLOMAX) 0.4 MG CAPS capsule TAKE 1 CAPSULE(0.4 MG) BY MOUTH DAILY  . ALPHA LIPOIC ACID PO Take by mouth daily. (Patient not taking: Reported on 04/23/2020)  . losartan (COZAAR) 25 MG tablet TAKE 1 TABLET BY MOUTH EVERY DAY (Patient not taking: Reported on 04/23/2020)  . rOPINIRole (REQUIP) 1 MG tablet Take 1 mg by mouth 3 (three) times daily.  (Patient not taking: Reported on 04/23/2020)   No facility-administered medications prior to visit.    Review of Systems  Constitutional: Negative for appetite change, chills, fatigue and fever.  Respiratory: Negative for chest tightness and shortness of breath.   Cardiovascular: Negative for chest pain and palpitations.  Gastrointestinal: Negative for abdominal pain, nausea and vomiting.  Neurological: Negative for dizziness and weakness.       Objective    BP (!) 110/59 (BP Location: Left Arm, Patient Position: Sitting, Cuff Size: Large)   Pulse 90   Temp 97.9 F (36.6 C) (Oral)   Resp 16   Ht $R'5\' 5"'mq$  (1.651 m)   Wt 169 lb (76.7 kg)   SpO2 95%   BMI 28.12 kg/m     Physical Exam Vitals reviewed.  HENT:     Head: Normocephalic and atraumatic.     Right Ear: Tympanic membrane, ear canal and external ear normal.     Left Ear: Tympanic membrane, ear canal and external ear normal.  Eyes:     General: No scleral icterus.    Conjunctiva/sclera: Conjunctivae normal.  Neck:     Vascular: No carotid bruit.  Cardiovascular:     Rate and Rhythm: Normal rate and regular rhythm.     Heart sounds: Normal heart sounds.  Pulmonary:     Breath sounds: Normal breath sounds.  Abdominal:     Palpations: Abdomen is soft. There is  no mass.     Comments: Normal abdominal exam.  Musculoskeletal:     Cervical back: No muscular tenderness.     Right lower leg: Edema present.     Left lower leg: Edema present.     Comments: Signifcant thoracic kyphosis. Trace LE edema.Lymphedema.  Lymphadenopathy:     Cervical: No cervical adenopathy.  Skin:    General: Skin is warm and dry.  Neurological:     General: No focal deficit present.     Mental Status: She is alert and oriented to person, place, and time.  Psychiatric:        Mood and Affect: Mood normal.        Behavior: Behavior normal.        Thought Content: Thought content normal.        Judgment: Judgment normal.  Results for orders placed or performed in visit on 04/23/20  POCT glycosylated hemoglobin (Hb A1C)  Result Value Ref Range   Hemoglobin A1C 8.3 (A) 4.0 - 5.6 %   Est. average glucose Bld gHb Est-mCnc 192     Assessment & Plan     1. Type 2 diabetes mellitus with diabetic polyneuropathy, without long-term current use of insulin (HCC) A1C  is 8.3. Improving diabetic control. Changes in care. - POCT glycosylated hemoglobin (Hb A1C)  2. Essential hypertension   3. Neuropathy [place] stable  4. Need for influenza vaccination  - Flu Vaccine QUAD High Dose(Fluad)  5. Chronic systolic CHF (congestive heart failure), NYHA class 3 (HCC) Medically stable  6. OSA (obstructive sleep apnea) Sleep study pending for OSA. Her caregiver says she snores loudly and has apneic spells at night  7. Neuropathic pain   8. Spinal stenosis of lumbar region with neurogenic claudication She is now mainly confined to a wheelchair.   Return in about 5 months (around 09/21/2020).         Berton Butrick Cranford Mon, MD  [facility] [phone] (phone) [phone] (fax)  [place]

## 2020-04-29 ENCOUNTER — Ambulatory Visit: Payer: Self-pay

## 2020-04-29 NOTE — Telephone Encounter (Signed)
Patient called and says that twice last night she was woke up due to her heart beating really fast and hard. She says the first time was maybe around 0200-0300 and lasted maybe between 1-5 minutes, it was not timed. She says during the first episode, she was feeling pressure in the head and blurry vision. She says the got up out the bed to a recliner, the fast heart beat did not stop, so she got back in the bed and propped herself up with pillows and did some deep breathing, then finally the heart beat went back to normal. She says the second time wasn't as bad. She denies any symptoms today. She did say that this is not the first time this has happened, but last night was the worst and it's becoming more frequent, so she called the office. She says she only wants to see Dr. Sullivan Lone. I advised he's not in the office and has one opening on next Wednesday that he will have to approve. She says she will wait to hear what he wants her to do. I advised someone from the office will call back. Care advice given, advised to go to ED for worsening/persistent symptoms per care advice, she verbalized understanding.  Reason for Disposition . Palpitations are a chronic symptom (recurrent or ongoing AND present > 4 weeks)  Answer Assessment - Initial Assessment Questions 1. DESCRIPTION: "Please describe your heart rate or heartbeat that you are having" (e.g., fast/slow, regular/irregular, skipped or extra beats, "palpitations")     Fast beats last night, woke me up out my sleep 2. ONSET: "When did it start?" (Minutes, hours or days)      Around 2-3 am this morning 3. DURATION: "How long does it last" (e.g., seconds, minutes, hours)     Less than 5 minutes, but more than 1 minute 4. PATTERN "Does it come and go, or has it been constant since it started?"  "Does it get worse with exertion?"   "Are you feeling it now?"     Come and go twice during the night 5. TAP: "Using your hand, can you tap out what you are feeling  on a chair or table in front of you, so that I can hear?" (Note: not all patients can do this)       N/A 6. HEART RATE: "Can you tell me your heart rate?" "How many beats in 15 seconds?"  (Note: not all patients can do this)       N/A 7. RECURRENT SYMPTOM: "Have you ever had this before?" If Yes, ask: "When was the last time?" and "What happened that time?"      Yes, but not as severe 8. CAUSE: "What do you think is causing the palpitations?"     I don't know 9. CARDIAC HISTORY: "Do you have any history of heart disease?" (e.g., heart attack, angina, bypass surgery, angioplasty, arrhythmia)      No 10. OTHER SYMPTOMS: "Do you have any other symptoms?" (e.g., dizziness, chest pain, sweating, difficulty breathing)      Not now 11. PREGNANCY: "Is there any chance you are pregnant?" "When was your last menstrual period?"       No  Protocols used: HEART RATE AND HEARTBEAT QUESTIONS-A-AH

## 2020-05-04 ENCOUNTER — Telehealth: Payer: Self-pay | Admitting: Pulmonary Disease

## 2020-05-04 DIAGNOSIS — G4733 Obstructive sleep apnea (adult) (pediatric): Secondary | ICD-10-CM | POA: Diagnosis not present

## 2020-05-04 NOTE — Procedures (Signed)
POLYSOMNOGRAPHY Last, First: Renee Carpenter, Renee Carpenter MRN: [id number] Gender: Female Age (years): 38 Weight (lbs): 170 DOB: May 24, 1926 BMI: 28 Primary Care: No PCP Epworth Score: 15 Referring: Tomma Lightning MD Technician: Lowry Ram Interpreting: Tomma Lightning MD Study Type: Split Night BiPAP Ordered Study Type: Split Night CPAP Study date: 04/17/2020 Location: [place] CLINICAL INFORMATION  Renee Carpenter is a 86 year old Female and was referred to the sleep center for evaluation of G47.33 OSA: Adult and Pediatric (327.23). Indications include Congestive Heart Failure, COPD, Diabetes, Excessive Daytime Sleepiness, Hypertension, OSA, Snoring. MEDICATIONS  Patient self administered medications include: REQUIP, MAGNESIUM-OXIDE, GABAPENTIN. Medications administered during study include No sleep medicine administered. SLEEP STUDY TECHNIQUE  The patient underwent an attended overnight level one polysomnography titration to assess the effects of BIPAP therapy. The following variables were monitored: EEG (C4-A1, C3-A2, O1-A2, O2-A1), EOG, submental and leg EMG, ECG, oxyhemoglobin saturation by pulse oximetry, thoracic and abdominal respiratory effort belts, nasal/oral airflow by pressure sensor, body position sensor and snoring sensor. BIPAP pressure was titrated to eliminate apneas, hypopneas and oxygen desaturation.Hypopneas were scored per AASM definition IB (4% desaturation)  The NPSG portion of the study ended at 12:45:50 AM. The BiPAP titration was initiated at 1:09:17 AM AM with the BIPAP portion of the study ending at 5:39:26 AM. TECHNICIAN COMMENTS Comments added by Technician: NONE Comments added by Scorer: N/A SLEEP ARCHITECTURE The recording time for the entire night was 437.3 minutes.  The diagnostic portion was initiated at 10:22:07 PM and terminated at 12:45:50 AM. The time in bed was 143.7 minutes. EEG confirmed total sleep time was 119.3 minutes yielding a  sleep efficiency of 83.0%%. Sleep onset after lights out was 8.9 minutes with a REM latency of N/A minutes. The patient spent 10.9%% of the night in stage N1 sleep, 89.1% % in stage N2 sleep, 0.0%% in stage N3 and 0% in REM. The Arousal Index was 8.0/hour.  The titration portion was initiated at 1:09:17 AM and terminated at 5:39:26 AM. The time in bed was 270.2 minutes. EEG confirmed total sleep time was 191.0 minutes yielding a sleep efficiency of 70.7%%. Sleep onset after BiPAP initiation was 27.2 minutes with a REM latency of N/A minutes. The patient spent 11.5%% of the night in stage N1 sleep, 88.5%% in stage N2 sleep, 0.0%% in stage N3 and 0% in REM. The Arousal Index was 49.9/hour. RESPIRATORY PARAMETERS During the diagnostic portion, there were a total of 37 respiratory disturbances recorded; 1 apneas ( 0 obstructive, 1 mixed, 0 central), 31 hypopneas and 5 RERAs. The apnea/hypopnea index 16.1 was events/hour and the RDI was 18.6 events/hour. The central sleep apnea index was 0.0 events/hour. The REM AHI was N/A/h and NREM AHI was 16.1/h. The REM RDI was 0.0/h and NREM RDI was 18.6/h. The supine AHI was N/A/h, and the non supine AHI was 16.1/h; supine during 0.0%% of sleep. The supine RDI was 0.0/h, and the non supine RDI was 18.61/h. Respiratory disturbances were associated with oxygen desaturation down to a nadir of 81.0 % during sleep. The mean oxygen saturation during the study was 90.1%. The cumulative time under 88% oxygen saturation was 26.3 minutes.  During the titration portion, the apnea/hypopnea index (AHI) was 11.9 events/hour and the RDI was 12.3 events/hour. The central sleep apnea index was events/hour. The most appropriate setting of BiPAP was IPAP/EPAP 19/15 cm H2O. At this setting, the sleep efficiency was 93% and the patient was supine for 0%. The AHI was 1.1 events per hour(with 0  central events). Oxygen nadir was 89.0 . LEG MOVEMENT DATA The periodic limb movement index was  0.0/hour with an associated arousal index of /hour. CARDIAC DATA The underlying cardiac rhythm was most consistent with sinus rhythm. Mean heart rate was 76.1 during diagnostic portion and 76.4 during titration portion of study. Additional rhythm abnormalities include None.   IMPRESSIONS     EKG showed no cardiac abnormalities.     The patient snored with soft snoring volume.     Moderate Obstructive Sleep apnea(OSA) Optimal pressure attained.     No Significant Central Sleep Apnea (CSA)     Moderate Oxygen Desaturation     No significant periodic leg movements(PLMs) during sleep. Associated arousals were significant with an arousal index of 17.2 /hour.     Reduced sleep efficiency, normal primary sleep latency, no REM sleep latency and no slow wave latency.   DIAGNOSIS     Obstructive Sleep Apnea (G47.33)     Periodic Limb Movement During Sleep (G47.61)   RECOMMENDATIONS     Trial of BiPAP therapy on 19/15 cm H2O with a Medium size Resmed Full Face Mask AirFit F20 mask and heated humidification.     Avoid alcohol, sedatives and other CNS depressants that may worsen sleep apnea and disrupt normal sleep architecture.     Sleep hygiene should be reviewed to assess factors that may improve sleep quality.     Weight management and regular exercise should be initiated or continued.     Return to Sleep Center for re-evaluation.   [Electronically signed] 05/04/2020 10:14 AM  Virl Diamond MD NPI: [id number]

## 2020-05-04 NOTE — Telephone Encounter (Signed)
Call patient  Sleep study result  Date of study: 04/17/2020  Impression: Moderate obstructive sleep apnea  Recommendation: DME referral  Recommend CPAP therapy for moderate obstructive sleep apnea  Trial of BiPAP therapy on 19/15 cm H2O with a Medium size Resmed Full Face Mask AirFit F20 mask and heated humidification.  Encourage weight loss measures  Follow-up in the office 4 to 6 weeks following initiation of treatment

## 2020-05-06 NOTE — Telephone Encounter (Signed)
Called and spoke to pt. Informed her of the results and recs per Dr. Wynona Neat. Order placed and OV made for 06/2020. Pt verbalized understanding and denied any further questions or concerns at this time.

## 2020-05-13 ENCOUNTER — Other Ambulatory Visit: Payer: Self-pay | Admitting: Family Medicine

## 2020-05-16 DIAGNOSIS — G2581 Restless legs syndrome: Secondary | ICD-10-CM | POA: Diagnosis not present

## 2020-05-16 DIAGNOSIS — G4752 REM sleep behavior disorder: Secondary | ICD-10-CM | POA: Diagnosis not present

## 2020-05-16 DIAGNOSIS — G608 Other hereditary and idiopathic neuropathies: Secondary | ICD-10-CM | POA: Diagnosis not present

## 2020-05-16 DIAGNOSIS — R259 Unspecified abnormal involuntary movements: Secondary | ICD-10-CM | POA: Diagnosis not present

## 2020-05-19 ENCOUNTER — Ambulatory Visit: Payer: Self-pay | Admitting: *Deleted

## 2020-05-19 ENCOUNTER — Other Ambulatory Visit: Payer: Self-pay

## 2020-05-19 ENCOUNTER — Emergency Department: Payer: Medicare PPO

## 2020-05-19 ENCOUNTER — Encounter: Payer: Self-pay | Admitting: Emergency Medicine

## 2020-05-19 DIAGNOSIS — J449 Chronic obstructive pulmonary disease, unspecified: Secondary | ICD-10-CM | POA: Diagnosis not present

## 2020-05-19 DIAGNOSIS — I130 Hypertensive heart and chronic kidney disease with heart failure and stage 1 through stage 4 chronic kidney disease, or unspecified chronic kidney disease: Secondary | ICD-10-CM | POA: Diagnosis not present

## 2020-05-19 DIAGNOSIS — R42 Dizziness and giddiness: Secondary | ICD-10-CM | POA: Diagnosis not present

## 2020-05-19 DIAGNOSIS — I5022 Chronic systolic (congestive) heart failure: Secondary | ICD-10-CM | POA: Insufficient documentation

## 2020-05-19 DIAGNOSIS — Z79899 Other long term (current) drug therapy: Secondary | ICD-10-CM | POA: Diagnosis not present

## 2020-05-19 DIAGNOSIS — N3000 Acute cystitis without hematuria: Secondary | ICD-10-CM | POA: Diagnosis not present

## 2020-05-19 DIAGNOSIS — N183 Chronic kidney disease, stage 3 unspecified: Secondary | ICD-10-CM | POA: Insufficient documentation

## 2020-05-19 DIAGNOSIS — G20 Parkinson's disease: Secondary | ICD-10-CM | POA: Diagnosis not present

## 2020-05-19 DIAGNOSIS — R29818 Other symptoms and signs involving the nervous system: Secondary | ICD-10-CM | POA: Diagnosis not present

## 2020-05-19 DIAGNOSIS — R5383 Other fatigue: Secondary | ICD-10-CM | POA: Insufficient documentation

## 2020-05-19 DIAGNOSIS — E1122 Type 2 diabetes mellitus with diabetic chronic kidney disease: Secondary | ICD-10-CM | POA: Diagnosis not present

## 2020-05-19 DIAGNOSIS — Z043 Encounter for examination and observation following other accident: Secondary | ICD-10-CM | POA: Diagnosis not present

## 2020-05-19 DIAGNOSIS — H538 Other visual disturbances: Secondary | ICD-10-CM | POA: Diagnosis not present

## 2020-05-19 DIAGNOSIS — Z7982 Long term (current) use of aspirin: Secondary | ICD-10-CM | POA: Insufficient documentation

## 2020-05-19 LAB — DIFFERENTIAL
Abs Immature Granulocytes: 0.06 10*3/uL (ref 0.00–0.07)
Basophils Absolute: 0.1 10*3/uL (ref 0.0–0.1)
Basophils Relative: 1 %
Eosinophils Absolute: 0.2 10*3/uL (ref 0.0–0.5)
Eosinophils Relative: 3 %
Immature Granulocytes: 1 %
Lymphocytes Relative: 51 %
Lymphs Abs: 3.7 10*3/uL (ref 0.7–4.0)
Monocytes Absolute: 0.9 10*3/uL (ref 0.1–1.0)
Monocytes Relative: 12 %
Neutro Abs: 2.2 10*3/uL (ref 1.7–7.7)
Neutrophils Relative %: 32 %

## 2020-05-19 LAB — CBC
HCT: 32.6 % — ABNORMAL LOW (ref 36.0–46.0)
Hemoglobin: 11.1 g/dL — ABNORMAL LOW (ref 12.0–15.0)
MCH: 39.9 pg — ABNORMAL HIGH (ref 26.0–34.0)
MCHC: 34 g/dL (ref 30.0–36.0)
MCV: 117.3 fL — ABNORMAL HIGH (ref 80.0–100.0)
Platelets: 268 10*3/uL (ref 150–400)
RBC: 2.78 MIL/uL — ABNORMAL LOW (ref 3.87–5.11)
RDW: 17.2 % — ABNORMAL HIGH (ref 11.5–15.5)
WBC: 7.1 10*3/uL (ref 4.0–10.5)
nRBC: 0.3 % — ABNORMAL HIGH (ref 0.0–0.2)

## 2020-05-19 LAB — URINALYSIS, COMPLETE (UACMP) WITH MICROSCOPIC
Bacteria, UA: NONE SEEN
Bilirubin Urine: NEGATIVE
Glucose, UA: NEGATIVE mg/dL
Ketones, ur: NEGATIVE mg/dL
Nitrite: NEGATIVE
Protein, ur: 100 mg/dL — AB
Specific Gravity, Urine: 1.023 (ref 1.005–1.030)
WBC, UA: >50 WBC/hpf — ABNORMAL HIGH (ref 0–5)
pH: 5 (ref 5.0–8.0)

## 2020-05-19 LAB — PROTIME-INR
INR: 1 (ref 0.8–1.2)
Prothrombin Time: 12.9 seconds (ref 11.4–15.2)

## 2020-05-19 LAB — COMPREHENSIVE METABOLIC PANEL
ALT: 23 U/L (ref 0–44)
AST: 28 U/L (ref 15–41)
Albumin: 4 g/dL (ref 3.5–5.0)
Alkaline Phosphatase: 91 U/L (ref 38–126)
Anion gap: 11 (ref 5–15)
BUN: 26 mg/dL — ABNORMAL HIGH (ref 8–23)
CO2: 28 mmol/L (ref 22–32)
Calcium: 9.1 mg/dL (ref 8.9–10.3)
Chloride: 103 mmol/L (ref 98–111)
Creatinine, Ser: 1.41 mg/dL — ABNORMAL HIGH (ref 0.44–1.00)
GFR, Estimated: 35 mL/min — ABNORMAL LOW (ref >60)
Glucose, Bld: 267 mg/dL — ABNORMAL HIGH (ref 70–99)
Potassium: 4.1 mmol/L (ref 3.5–5.1)
Sodium: 142 mmol/L (ref 135–145)
Total Bilirubin: 0.7 mg/dL (ref 0.3–1.2)
Total Protein: 7.1 g/dL (ref 6.5–8.1)

## 2020-05-19 LAB — CBG MONITORING, ED: Glucose-Capillary: 238 mg/dL — ABNORMAL HIGH (ref 70–99)

## 2020-05-19 LAB — APTT: aPTT: 28 seconds (ref 24–36)

## 2020-05-19 LAB — TROPONIN I (HIGH SENSITIVITY): Troponin I (High Sensitivity): 21 ng/L — ABNORMAL HIGH (ref <18)

## 2020-05-19 NOTE — Telephone Encounter (Signed)
Patient reports "slipping out of bed last Thursday 12/9 she was not injured but since then she has had a headache on the right side of her head and decreased right eye vision. Could not answer strength questions as she is in a wheelchair and could not answer other type questions as she was not near a mirror or around anyone else when she called. On Friday she had a standing appointment with Dr.Shaw and could not name her medications not remember what she was going to talk about with him. Her speech sounds slightly slurred to me at this time. Advised ED for stroke evaluation. Stated she would prefer to wait until morning but would ask a friend to take her this afternoon. Reviewed other urgent symptoms and if noticed to call 911 if she does not go to the ED today. Encouraged patient to go on today.  Reason for Disposition . [1] Loss of speech or garbled speech AND [2] gradual onset (e.g., days to weeks) AND [3] present now  Answer Assessment - Initial Assessment Questions 1. SYMPTOM: "What is the main symptom you are concerned about?" (e.g., weakness, numbness)     None. Right sided HA 2. ONSET: "When did this start?" (minutes, hours, days; while sleeping)    Last week 3. LAST NORMAL: "When was the last time you were normal (no symptoms)?"     Before last Thursday 4. PATTERN "Does this come and go, or has it been constant since it started?"  "Is it present now?"     She is not sure 5. CARDIAC SYMPTOMS: "Have you had any of the following symptoms: chest pain, difficulty breathing, palpitations?"     None 6. NEUROLOGIC SYMPTOMS: "Have you had any of the following symptoms: headache, dizziness, vision loss, double vision, changes in speech, unsteady on your feet?"     Headache and right eye has slight decreased vision 7. OTHER SYMPTOMS: "Do you have any other symptoms?"     no 8. PREGNANCY: "Is there any chance you are pregnant?" "When was your last menstrual period?"    na  Protocols used:  NEUROLOGIC DEFICIT-A-AH

## 2020-05-19 NOTE — ED Triage Notes (Addendum)
Pt here after episode of falling out of bed while sleeping last Friday.  Has felt fatigued and like in a fog since then.  C/o bilateral blurry vision. No new weakness or numbness; has difficulty with balance at baseline r/t neuropathy but reports seems worse.  C/o intermittent headache since Friday. On ASA but no blood thinners. Reports she talked to her doctor and they told her to come be evaluated for TIA/CVA.  Pt is oriented. Uses wheelchair at baseline.  No pain at this time. Is diabetic, has not been checking sugars. When asked has had some black stools.

## 2020-05-20 ENCOUNTER — Emergency Department: Payer: Medicare PPO

## 2020-05-20 ENCOUNTER — Emergency Department
Admission: EM | Admit: 2020-05-20 | Discharge: 2020-05-20 | Disposition: A | Discharge status: Home / Self Care | Payer: Medicare PPO | Attending: Emergency Medicine | Admitting: Emergency Medicine

## 2020-05-20 DIAGNOSIS — Z043 Encounter for examination and observation following other accident: Secondary | ICD-10-CM | POA: Diagnosis not present

## 2020-05-20 DIAGNOSIS — R5383 Other fatigue: Secondary | ICD-10-CM

## 2020-05-20 DIAGNOSIS — H538 Other visual disturbances: Secondary | ICD-10-CM | POA: Diagnosis not present

## 2020-05-20 DIAGNOSIS — N3000 Acute cystitis without hematuria: Secondary | ICD-10-CM

## 2020-05-20 MED ORDER — LACTATED RINGERS IV BOLUS
1000.0000 mL | Freq: Once | INTRAVENOUS | Status: AC
  Administered 2020-05-20: 03:00:00 1000 mL via INTRAVENOUS

## 2020-05-20 MED ORDER — CEPHALEXIN 500 MG PO CAPS: 500.0000 mg | ORAL_CAPSULE | Freq: Two times a day (BID) | ORAL | 0 refills | Status: AC

## 2020-05-20 MED ORDER — SODIUM CHLORIDE 0.9 % IV SOLN
1.0000 g | Freq: Once | INTRAVENOUS | Status: AC
  Administered 2020-05-20: 02:00:00 1 g via INTRAVENOUS
  Filled 2020-05-20: qty 10

## 2020-05-20 NOTE — ED Notes (Signed)
Patient transported to MRI 

## 2020-05-20 NOTE — ED Provider Notes (Signed)
 [facility] Emergency Department Provider Note   ____________________________________________   Event Date/Time   First MD Initiated Contact with Patient 05/20/20 0102     (approximate)  I have reviewed the triage vital signs and the nursing notes.   HISTORY  Chief Complaint Fatigue and Dizziness    HPI Renee Carpenter is a 86 y.o. female with past medical history of hypertension, diabetes, and CKD who presents to the ED complaining of fatigue and dizziness.  Patient reports that she fell out of bed 3 mornings ago and struck her head, but did not lose consciousness.  Afterwards, she went to a regular scheduled appointment with her PCP, but states she felt like she was in a fog the whole time she was there.  She states she had bilateral blurry vision but denies any speech changes, numbness, or weakness.  The foggy feeling and blurry vision has since improved and she denies any headache or neck pain.  She denies any fevers, cough, chest pain, shortness of breath, nausea, vomiting, dysuria, or hematuria.  She spoke with her PCPs office earlier today, who recommended she be evaluated in the ED for possible stroke.        Past Medical History:  Diagnosis Date  . Anemia    WAS ANEMIC  . Arthritis   . Blood transfusion    HAD 1 SEVERAL YRS AGO...3-4 YR  . Chronic kidney disease    SOME DECREASE IN KIDNEY FUNCTION  . Diabetes mellitus without complication (HCC)   . Hypertension   . Normal cardiac stress test    REQUESTING COPY FROM [facility]  . Normal echocardiogram    REQUESTING THAT RESULT -- DR KAHN   . PONV (postoperative nausea and vomiting)   . Shortness of breath   . Spondylolysis 1998    Patient Active Problem List   Diagnosis Date Noted  . OSA (obstructive sleep apnea) 04/17/2020  . Chronic venous insufficiency 03/13/2019  . Lymphedema 03/13/2019  . RBD (REM behavioral disorder) 02/08/2019  . Parkinsonian features 12/07/2017  .  Chronic systolic CHF (congestive heart failure), NYHA class 3 (HCC) 06/24/2017  . Chronic kidney disease, stage 3, mod decreased GFR (HCC) 04/15/2016  . Ds DNA antibody positive 04/15/2016  . Radicular pain of both lower extremities 04/13/2016  . Spine pain, lumbar 04/13/2016  . ANA positive 04/06/2016  . Right upper quadrant pain 09/11/2015  . Neuropathic pain 04/15/2015  . Cervical pain 01/24/2015  . Depression 01/01/2015  . Back pain, chronic 12/14/2014  . COPD, mild (HCC) 12/14/2014  . Anxiety, generalized 12/14/2014  . Esophageal reflux 12/14/2014  . Diabetes (HCC) 12/14/2014  . Progeria syndrome 12/14/2014  . BP (high blood pressure) 12/14/2014  . Adaptive colitis 12/14/2014  . Cannot sleep 12/14/2014  . Affective disorder, major 12/14/2014  . Chronic anemia 12/14/2014  . Arthritis, degenerative 12/14/2014  . OP (osteoporosis) 12/14/2014  . Restless leg 12/14/2014  . Neuralgia neuritis, sciatic nerve 12/14/2014  . 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 Lumbar canal stenosis 02/10/2012  . DDD (degenerative disc disease), lumbosacral 11/19/2011  . Neuropathy (HCC) 02/26/2008  . Essential hypertension 02/26/2008  . Fatigue 02/26/2008  . DYSPNEA 02/26/2008    Past Surgical History:  Procedure Laterality Date  . ABDOMINAL HYSTERECTOMY    . BACK SURGERY     2 CERVICAL SURGERIES ALSO  . CATARACTS     RIGHT EYE CATARACT  . CERVICAL DISCECTOMY  08/1995 and 2011   C4-,C5-6, C6-7 anterior   cervical; Dr. Ellene Route fusion and plating  . LAMINECTOMY     decompressive; Dr. Burman Riis in [place] MICRODISCECTOMY  04/14/2011   Procedure: LUMBAR LAMINECTOMY/DECOMPRESSION MICRODISCECTOMY;  Surgeon: [name];  Location: [place] [facility];  Service: Neurosurgery;  Laterality: N/A;  Lumbar Three and Lumbar Four Laminectomy  . TONSILLECTOMY AND ADENOIDECTOMY  1939    Prior to Admission medications   Medication Sig Start Date End  Date Taking? Authorizing Provider  Accu-Chek FastClix Lancets MISC USE TO TEST BLOOD SUGAR UP TO 4 TIMES DAILY AS DIRECTED 01/28/20   Jerrol Banana., MD  ACCU-CHEK GUIDE test strip USE TO TEST BLOOD SUGAR UP TO 4 X DAILY 01/28/20   Jerrol Banana., MD  acetaminophen (TYLENOL) 500 MG tablet Take 500 mg by mouth every 6 (six) hours as needed. 07/16/16   [provider]  albuterol (VENTOLIN HFA) 108 (90 Base) MCG/ACT inhaler Inhale 2 puffs into the lungs every 4 (four) hours as needed for wheezing or shortness of breath. 01/21/20   Jerrol Banana., MD  ALPHA LIPOIC ACID PO Take by mouth daily. Patient not taking: Reported on 04/23/2020    [provider]  aspirin EC 81 MG tablet Take 81 mg by mouth daily.     [provider]  blood glucose meter kit and supplies KIT Dispense based on patient and insurance preference. Use up to four times daily as directed. (FOR ICD-9 250.00, 250.01). 07/25/18   Jerrol Banana., MD  cephALEXin (KEFLEX) 500 MG capsule Take 1 capsule (500 mg total) by mouth 2 (two) times daily for 7 days. 05/20/20 05/27/20  Blake Divine, MD  Coenzyme Q10 (CO Q10) 200 MG CAPS Take by mouth daily.  08/09/11   [provider]  DULoxetine (CYMBALTA) 30 MG capsule Take 1 capsule (30 mg total) by mouth daily. 10/10/19   Jerrol Banana., MD  esomeprazole (NEXIUM) 40 MG capsule TAKE 1 CAPSULE BY MOUTH EVERY DAY 07/02/19   Jerrol Banana., MD  gabapentin (NEURONTIN) 600 MG tablet Take 1 tablet (600 mg total) by mouth 3 (three) times daily. 01/21/20   Jerrol Banana., MD  glimepiride (AMARYL) 4 MG tablet TAKE 1 TABLET(4 MG) BY MOUTH DAILY BEFORE BREAKFAST 04/01/20   Jerrol Banana., MD  hydroxypropyl methylcellulose (ISOPTO TEARS) 2.5 % ophthalmic solution Place 1 drop into both eyes 3 (three) times daily as needed. Dry eyes     [provider]  losartan (COZAAR) 25 MG tablet TAKE 1 TABLET BY MOUTH EVERY  DAY Patient not taking: Reported on 04/23/2020 08/28/18   Jerrol Banana., MD  MAGNESIUM-OXIDE 400 (241.3 Mg) MG tablet TAKE 1 TABLET BY MOUTH TWICE DAILY 03/17/17   Jerrol Banana., MD  MULTIPLE VITAMIN PO Take by mouth daily.  08/09/11   [provider]  predniSONE (STERAPRED UNI-PAK 21 TAB) 10 MG (21) TBPK tablet Take as per directed 01/30/19   Jerrol Banana., MD  pregabalin (LYRICA) 25 MG capsule Take 1 capsule (25 mg total) by mouth 2 (two) times daily. 01/20/20   Jerrol Banana., MD  rOPINIRole (REQUIP) 1 MG tablet Take 1 mg by mouth 3 (three) times daily.  Patient not taking: Reported on 04/23/2020 10/10/12   [provider]  ropinirole (REQUIP) 5 MG tablet Take 5 mg by mouth in the morning, at noon, in the evening, and at bedtime.    [provider]  senna-docusate (SENOKOT-S) 8.6-50 MG tablet Take 1 tablet by mouth daily as needed.  07/16/16   [provider]  tamsulosin (FLOMAX) 0.4 MG CAPS capsule TAKE 1 CAPSULE(0.4 MG) BY MOUTH DAILY 09/30/19   Jerrol Banana., MD    Allergies Acetaminophen, Aspirin, Metformin and related, Other, Sulfa antibiotics, Sulfasalazine, Tizanidine, Caffeine, Ginger, and Ibuprofen  Family History  Problem Relation Age of Onset  . Heart failure Father   . Heart disease Father   . Hypertension Mother   . Diabetes Mother   . Heart disease Mother   . Cancer Sister        breast  . Breast cancer Sister 35  . Cancer Maternal Grandfather   . Cancer Maternal Aunt        x3  . Breast cancer Daughter 78    Social History Social History   Tobacco Use  . Smoking status: Never Smoker  . Smokeless tobacco: Never Used  Substance Use Topics  . Alcohol use: No  . Drug use: No    Review of Systems  Constitutional: No fever/chills.  Positive for dizziness and "fogginess." Eyes: No visual changes. ENT: No sore throat. Cardiovascular: Denies chest pain. Respiratory: Denies shortness of  breath. Gastrointestinal: No abdominal pain.  No nausea, no vomiting.  No diarrhea.  No constipation. Genitourinary: Negative for dysuria. Musculoskeletal: Negative for back pain. Skin: Negative for rash. Neurological: Negative for headaches, focal weakness or numbness.  ____________________________________________   PHYSICAL EXAM:  VITAL SIGNS: ED Triage Vitals  Enc Vitals Group     BP 05/19/20 1832 (!) 136/57     Pulse Rate 05/19/20 1832 86     Resp 05/19/20 1832 16     Temp 05/19/20 1832 97.9 F (36.6 C)     Temp Source 05/19/20 1832 Oral     SpO2 05/19/20 1832 96 %     Weight 05/19/20 1833 165 lb (74.8 kg)     Height 05/19/20 1833 5' 5" (1.651 m)     Head Circumference --      Peak Flow --      Pain Score 05/19/20 1833 0     Pain Loc --      Pain Edu? --      Excl. in [place]? --     Constitutional: Alert and oriented. Eyes: Conjunctivae are normal. Head: Atraumatic. Nose: No congestion/rhinnorhea. Mouth/Throat: Mucous membranes are moist. Neck: Normal ROM Cardiovascular: Normal rate, regular rhythm. Grossly normal heart sounds. Respiratory: Normal respiratory effort.  No retractions. Lungs CTAB. Gastrointestinal: Soft and nontender. No distention. Genitourinary: deferred Musculoskeletal: No lower extremity tenderness nor edema. Neurologic:  Normal speech and language.  Difficulty lifting right arm up but no drift noted when arm positioned.  5 out of 5 strength in left upper extremity and bilateral lower extremities. Skin:  Skin is warm, dry and intact. No rash noted. Psychiatric: Mood and affect are normal. Speech and behavior are normal.  ____________________________________________   LABS (all labs ordered are listed, but only abnormal results are displayed)  Labs Reviewed  CBC - Abnormal; Notable for the following components:      Result Value   RBC 2.78 (*)    Hemoglobin 11.1 (*)    HCT 32.6 (*)    MCV 117.3 (*)    MCH 39.9 (*)    RDW 17.2 (*)    nRBC  0.3 (*)    All other components within normal limits  COMPREHENSIVE METABOLIC PANEL - Abnormal; Notable for the following components:  Glucose, Bld 267 (*)    BUN 26 (*)    Creatinine, Ser 1.41 (*)    GFR, Estimated 35 (*)    All other components within normal limits  URINALYSIS, COMPLETE (UACMP) WITH MICROSCOPIC - Abnormal; Notable for the following components:   Color, Urine YELLOW (*)    APPearance HAZY (*)    Hgb urine dipstick SMALL (*)    Protein, ur 100 (*)    Leukocytes,Ua MODERATE (*)    WBC, UA >50 (*)    All other components within normal limits  CBG MONITORING, ED - Abnormal; Notable for the following components:   Glucose-Capillary 238 (*)    All other components within normal limits  TROPONIN I (HIGH SENSITIVITY) - Abnormal; Notable for the following components:   Troponin I (High Sensitivity) 21 (*)    All other components within normal limits  URINE CULTURE  PROTIME-INR  APTT  DIFFERENTIAL  CBG MONITORING, ED   ____________________________________________  EKG  ED ECG REPORT I, Blake Divine, the attending physician, personally viewed and interpreted this ECG.   Date: 05/20/2020  EKG Time: 18:41  Rate: 87  Rhythm: normal sinus rhythm  Axis: LAD  Intervals:left bundle branch block  ST&T Change: None, negative sgarbossa   PROCEDURES  Procedure(s) performed (including Critical Care):  Procedures   ____________________________________________   INITIAL IMPRESSION / ASSESSMENT AND PLAN / ED COURSE       86 year old female with past medical history of hypertension, diabetes, and CKD who presents to the ED complaining of episode of fogginess and bilateral blurry vision 3 days ago that has since improved.  She was sent to the ED by her PCP for further evaluation for possible stroke versus TIA, she denies any neurologic deficits, but on exam has difficulty lifting her right arm.  She is able to keep her right arm elevated once it is positioned so it  is unclear whether this could be due to stroke.  If she has had a stroke, she is outside the window for any intervention.  We will check MRI brain to further differentiate, otherwise she does report chronic issues with her right shoulder in the past, but has never had similar issues lifting her right arm.  She does have a UTI, which could explain some of her fogginess.  We will treat with Rocephin and hydrate with IV fluids.  MRI is negative for acute process, no evidence of acute stroke.  Patient's episode of "fogginess" 3 days ago with bilateral blurry vision unlikely to represent TIA.  She is appropriate for discharge home with a prescription for Keflex for UTI.  She was counseled to follow-up with her PCP and return to the ED for new worsening symptoms.  Patient agrees with plan.      ____________________________________________   FINAL CLINICAL IMPRESSION(S) / ED DIAGNOSES  Final diagnoses:  Acute cystitis without hematuria  Fatigue, unspecified type     ED Discharge Orders         Ordered    cephALEXin (KEFLEX) 500 MG capsule  2 times daily        05/20/20 0407           Note:  This document was prepared using Dragon voice recognition software and may include unintentional dictation errors.   Blake Divine, MD 05/20/20 [phone]

## 2020-05-21 ENCOUNTER — Telehealth: Payer: Self-pay | Admitting: Pulmonary Disease

## 2020-05-21 LAB — URINE CULTURE

## 2020-05-21 NOTE — Telephone Encounter (Signed)
05/21/2020  Spoke with patient.  Reviewed that we contacted the patient on 05/06/2020.  We will send community message to [employer]  to follow-up on Pap start.  Patient reports that she never received a welcome call.  Encourage patient to continue to answer unknown calls to the [phone] number.  Sent text message to [facility] representative as well.  Schedule patient for appointment follow-up in February/2022 with Dr. Val Eagle.  Nothing further needed at this time   Elisha Headland, FNP

## 2020-05-22 DIAGNOSIS — H353131 Nonexudative age-related macular degeneration, bilateral, early dry stage: Secondary | ICD-10-CM | POA: Diagnosis not present

## 2020-05-22 LAB — HM DIABETES EYE EXAM

## 2020-05-27 ENCOUNTER — Encounter: Payer: Self-pay | Admitting: *Deleted

## 2020-06-17 ENCOUNTER — Telehealth: Payer: Self-pay | Admitting: Pulmonary Disease

## 2020-06-17 ENCOUNTER — Ambulatory Visit: Payer: Medicare PPO | Admitting: Primary Care

## 2020-06-17 NOTE — Telephone Encounter (Signed)
I received this message back from [employer]   Hello,   Patient was scheduled for setup on 06/10/20 @ 1pm but was a no show. She will need to be rescheduled. I will email the scheduling team to contact her to set this up.   Thanks,   Nida Boatman New    I have called and spoke to the patient and let her know they will be in contact with her

## 2020-06-17 NOTE — Telephone Encounter (Signed)
Message sent to [employer] 

## 2020-07-28 DIAGNOSIS — G4733 Obstructive sleep apnea (adult) (pediatric): Secondary | ICD-10-CM | POA: Diagnosis not present

## 2020-07-29 ENCOUNTER — Ambulatory Visit: Payer: Medicare PPO | Admitting: Pulmonary Disease

## 2020-07-29 ENCOUNTER — Encounter: Payer: Self-pay | Admitting: Pulmonary Disease

## 2020-07-29 VITALS — BP 114/60 | HR 82 | Temp 97.9°F | Ht 64.0 in | Wt 170.0 lb

## 2020-07-29 DIAGNOSIS — G4733 Obstructive sleep apnea (adult) (pediatric): Secondary | ICD-10-CM | POA: Diagnosis not present

## 2020-07-29 NOTE — Patient Instructions (Signed)
Start using your BiPAP nightly  It does take a few days to get used to using it  If you feel the pressure is too high then give [place] a call and we can have the medical supply company to reduce the pressure to get you used to using it on a regular basis  I will follow-up with you in about 6 weeks  Do not hesitate to call if you are having any problems adjusting to the machine

## 2020-07-29 NOTE — Progress Notes (Signed)
Renee Carpenter    [id number]    February 12, 1926  Primary Care Physician:Gilbert, Retia Passe., MD  Referring Physician: Jerrol Banana., MD [address] [place],  [place] [address]  Chief complaint:   Patient with a history of significant snoring, witnessed apneas Diagnosed with moderate obstructive sleep apnea  HPI: Patient was just set up with BiPAP BiPAP settings of 19/15 Found to have moderate obstructive sleep apnea  Still reports excessive daytime sleepiness Will not off easily during the day Remains fatigued  There is no report of significant snoring and witnessed apneas   She wakes up tired, cannot fall asleep at any time  Been evaluated and treated for parkinsonism and tremors  Usually goes to bed between 10 and 1230, takes a few minutes to fall asleep About 2 awakenings usually to use the bathroom Final wake up time about 9 AM  Weight has been stable  She does deny headaches in the morning, denies any significant dryness of the mouth  No significant family history of sleep disordered breathing  Does not smoke, does not drink alcohol  Outpatient Encounter Medications as of 07/29/2020  Medication Sig  . Accu-Chek FastClix Lancets MISC USE TO TEST BLOOD SUGAR UP TO 4 TIMES DAILY AS DIRECTED  . ACCU-CHEK GUIDE test strip USE TO TEST BLOOD SUGAR UP TO 4 X DAILY  . acetaminophen (TYLENOL) 500 MG tablet Take 500 mg by mouth every 6 (six) hours as needed.  Marland Kitchen albuterol (VENTOLIN HFA) 108 (90 Base) MCG/ACT inhaler Inhale 2 puffs into the lungs every 4 (four) hours as needed for wheezing or shortness of breath.  . ALPHA LIPOIC ACID PO Take by mouth daily.  Marland Kitchen aspirin EC 81 MG tablet Take 81 mg by mouth daily.   . blood glucose meter kit and supplies KIT Dispense based on patient and insurance preference. Use up to four times daily as directed. (FOR ICD-9 250.00, 250.01).  . carbidopa-levodopa (SINEMET IR) 25-100 MG tablet Take by mouth.  Take 1 tablet three times a day  . Coenzyme Q10 (CO Q10) 200 MG CAPS Take by mouth daily.   . DULoxetine (CYMBALTA) 30 MG capsule Take 1 capsule (30 mg total) by mouth daily.  Marland Kitchen esomeprazole (NEXIUM) 40 MG capsule TAKE 1 CAPSULE BY MOUTH EVERY DAY  . gabapentin (NEURONTIN) 600 MG tablet Take 1 tablet (600 mg total) by mouth 3 (three) times daily.  Marland Kitchen glimepiride (AMARYL) 4 MG tablet TAKE 1 TABLET(4 MG) BY MOUTH DAILY BEFORE BREAKFAST  . hydroxypropyl methylcellulose (ISOPTO TEARS) 2.5 % ophthalmic solution Place 1 drop into both eyes 3 (three) times daily as needed. Dry eyes   . MAGNESIUM-OXIDE 400 (241.3 Mg) MG tablet TAKE 1 TABLET BY MOUTH TWICE DAILY  . MULTIPLE VITAMIN PO Take by mouth daily.   . pregabalin (LYRICA) 25 MG capsule Take 1 capsule (25 mg total) by mouth 2 (two) times daily.  Marland Kitchen rOPINIRole (REQUIP) 1 MG tablet Take 1 mg by mouth 3 (three) times daily.  . ropinirole (REQUIP) 5 MG tablet Take 5 mg by mouth in the morning, at noon, in the evening, and at bedtime.  . senna-docusate (SENOKOT-S) 8.6-50 MG tablet Take 1 tablet by mouth daily as needed.   . tamsulosin (FLOMAX) 0.4 MG CAPS capsule TAKE 1 CAPSULE(0.4 MG) BY MOUTH DAILY  . losartan (COZAAR) 25 MG tablet TAKE 1 TABLET BY MOUTH EVERY DAY (Patient not taking: No sig reported)  . predniSONE (  STERAPRED UNI-PAK 21 TAB) 10 MG (21) TBPK tablet Take as per directed (Patient not taking: Reported on 07/29/2020)   No facility-administered encounter medications on file as of 07/29/2020.    Allergies as of 07/29/2020 - Review Complete 07/29/2020  Allergen Reaction Noted  . Acetaminophen Itching and Other (See Comments)   . Aspirin Itching and Other (See Comments) 04/06/2011  . Metformin and related  06/17/2017  . Other Other (See Comments) 04/06/2011  . Sulfa antibiotics Other (See Comments) 01/01/2015  . Sulfasalazine  01/01/2015  . Tizanidine  06/18/2016  . Caffeine Palpitations 04/29/2015  . Ginger Palpitations 04/29/2015  .  Ibuprofen Rash 11/13/2013    Past Medical History:  Diagnosis Date  . Anemia    WAS ANEMIC  . Arthritis   . Blood transfusion    HAD 1 SEVERAL YRS AGO...3-4 YR  . Chronic kidney disease    SOME DECREASE IN KIDNEY FUNCTION  . Diabetes mellitus without complication ([place])   . Hypertension   . Normal cardiac stress test    REQUESTING COPY FROM [facility]  . Normal echocardiogram    REQUESTING THAT RESULT -- DR [name]   . PONV (postoperative nausea and vomiting)   . Shortness of breath   . Spondylolysis 1998    Past Surgical History:  Procedure Laterality Date  . ABDOMINAL HYSTERECTOMY    . BACK SURGERY     2 CERVICAL SURGERIES ALSO  . CATARACTS     RIGHT EYE CATARACT  . CERVICAL DISCECTOMY  08/1995 and 2011   C4-,C5-6, C6-7 anterior cervical; Dr. Ellene Route fusion and plating  . LAMINECTOMY     decompressive; Dr. Burman Riis in [place] MICRODISCECTOMY  04/14/2011   Procedure: LUMBAR LAMINECTOMY/DECOMPRESSION MICRODISCECTOMY;  Surgeon: [name];  Location: [place] [facility];  Service: Neurosurgery;  Laterality: N/A;  Lumbar Three and Lumbar Four Laminectomy  . TONSILLECTOMY AND ADENOIDECTOMY  1939    Family History  Problem Relation Age of Onset  . Heart failure Father   . Heart disease Father   . Hypertension Mother   . Diabetes Mother   . Heart disease Mother   . Cancer Sister        breast  . Breast cancer Sister 24  . Cancer Maternal Grandfather   . Cancer Maternal Aunt        x3  . Breast cancer Daughter 38    Social History   Socioeconomic History  . Marital status: Widowed    Spouse name: Not on file  . Number of children: 1  . Years of education: Not on file  . Highest education level: Bachelor's degree (e.g., BA, AB, BS)  Occupational History  . Occupation: retired  Tobacco Use  . Smoking status: Never Smoker  . Smokeless tobacco: Never Used  Substance and Sexual Activity  . Alcohol use: No  . Drug use: No  .  Sexual activity: Never  Other Topics Concern  . Not on file  Social History Narrative  . Not on file   Social Determinants of Health   Financial Resource Strain: Low Risk   . Difficulty of Paying Living Expenses: Not hard at all  Food Insecurity: No Food Insecurity  . Worried About Charity fundraiser in the Last Year: Never true  . Ran Out of Food in the Last Year: Never true  Transportation Needs: No Transportation Needs  . Lack of Transportation (Medical): No  . Lack of Transportation (Non-Medical): No  Physical Activity: Insufficiently Active  .  Days of Exercise per Week: 1 day  . Minutes of Exercise per Session: 20 min  Stress: Stress Concern Present  . Feeling of Stress : Rather much  Social Connections: Moderately Isolated  . Frequency of Communication with Friends and Family: Three times a week  . Frequency of Social Gatherings with Friends and Family: More than three times a week  . Attends Religious Services: Never  . Active Member of Clubs or Organizations: Yes  . Attends Archivist Meetings: More than 4 times per year  . Marital Status: Widowed  Intimate Partner Violence: Not At Risk  . Fear of Current or Ex-Partner: No  . Emotionally Abused: No  . Physically Abused: No  . Sexually Abused: No    Review of Systems  Constitutional: Positive for fatigue.  Respiratory: Positive for apnea.   Psychiatric/Behavioral: Positive for sleep disturbance.    Vitals:   07/29/20 1233  BP: 114/60  Pulse: 82  Temp: 97.9 F (36.6 C)  SpO2: 95%     Physical Exam Constitutional:      Appearance: Normal appearance.  HENT:     Nose: No congestion.     Mouth/Throat:     Mouth: Mucous membranes are moist.     Comments: Crowded oropharynx, Mallampati 2 Eyes:     General:        Right eye: No discharge.        Left eye: No discharge.  Cardiovascular:     Rate and Rhythm: Normal rate and regular rhythm.     Pulses: Normal pulses.     Heart sounds: Normal  heart sounds. No murmur heard. No friction rub.  Pulmonary:     Effort: Pulmonary effort is normal. No respiratory distress.     Breath sounds: No stridor. No wheezing or rhonchi.  Musculoskeletal:     Cervical back: No rigidity or tenderness.  Neurological:     Mental Status: She is alert.  Psychiatric:        Mood and Affect: Mood normal.    Results of the Epworth flowsheet 03/12/2020 01/19/2017  Sitting and reading 2 3  Watching TV 3 2  Sitting, inactive in a public place (e.g. a theatre or a meeting) 0 1  As a passenger in a car for an hour without a break 3 3  Lying down to rest in the afternoon when circumstances permit 3 3  Sitting and talking to someone 0 0  Sitting quietly after a lunch without alcohol 3 1  In a car, while stopped for a few minutes in traffic 0 0  Total score 14 13     Data Reviewed: Recent sleep study significant for moderate obstructive sleep apnea Titrated to BiPAP 19/15  Assessment:  Moderate obstructive sleep apnea -Patient was just set up with a BiPAP machine -Significant snoring  Excessive daytime sleepiness -This is likely related to untreated sleep disordered breathing  Pathophysiology of sleep disordered breathing discussed with the patient Treatment options discussed with the patient  Plan/Recommendations: Encourage patient to start using BiPAP  Encouraged to use it on a nightly basis Encouraged to give it a good effort as it may take a few days to get used to using BiPAP  She should give [place] a call if she is not tolerating pressures  Tentative follow-up in about 6 weeks  Encouraged to call with any significant concerns   Sherrilyn Rist MD [facility] 07/29/2020, 1:37 PM  CC: Jerrol Banana.,*

## 2020-08-01 ENCOUNTER — Telehealth: Payer: Self-pay | Admitting: Family Medicine

## 2020-08-01 DIAGNOSIS — E1142 Type 2 diabetes mellitus with diabetic polyneuropathy: Secondary | ICD-10-CM

## 2020-08-01 DIAGNOSIS — G4733 Obstructive sleep apnea (adult) (pediatric): Secondary | ICD-10-CM | POA: Diagnosis not present

## 2020-08-01 NOTE — Telephone Encounter (Signed)
Pt called and is requesting to have a prescription for a new blood glucose meter. She states that she has the Accucheck and that she lost some of the pieces to her old one. Please advise.      [id number] [place], [place] - [address] AT [facility] [address]  [address] [place] [address]  Phone: [phone] Fax: [phone]  Hours: Not open 24 hours

## 2020-08-03 NOTE — Telephone Encounter (Signed)
ok 

## 2020-08-04 MED ORDER — ACCU-CHEK GUIDE W/DEVICE KIT: PACK | 0 refills | Status: AC

## 2020-08-04 NOTE — Telephone Encounter (Signed)
Rx sent 

## 2020-08-08 DIAGNOSIS — E1140 Type 2 diabetes mellitus with diabetic neuropathy, unspecified: Secondary | ICD-10-CM | POA: Diagnosis not present

## 2020-08-08 DIAGNOSIS — B351 Tinea unguium: Secondary | ICD-10-CM | POA: Diagnosis not present

## 2020-08-08 DIAGNOSIS — M21371 Foot drop, right foot: Secondary | ICD-10-CM | POA: Diagnosis not present

## 2020-08-15 ENCOUNTER — Other Ambulatory Visit: Payer: Self-pay | Admitting: Family Medicine

## 2020-08-15 DIAGNOSIS — K2100 Gastro-esophageal reflux disease with esophagitis, without bleeding: Secondary | ICD-10-CM

## 2020-08-25 DIAGNOSIS — G4733 Obstructive sleep apnea (adult) (pediatric): Secondary | ICD-10-CM | POA: Diagnosis not present

## 2020-09-07 ENCOUNTER — Other Ambulatory Visit: Payer: Self-pay | Admitting: Family Medicine

## 2020-09-07 DIAGNOSIS — K2100 Gastro-esophageal reflux disease with esophagitis, without bleeding: Secondary | ICD-10-CM

## 2020-09-07 NOTE — Telephone Encounter (Signed)
Requested Prescriptions  Pending Prescriptions Disp Refills  . tamsulosin (FLOMAX) 0.4 MG CAPS capsule [Pharmacy Med Name: TAMSULOSIN 0.4MG  CAPSULES] 90 capsule 3    Sig: TAKE 1 CAPSULE(0.4 MG) BY MOUTH DAILY     Urology: Alpha-Adrenergic Blocker Passed - 09/07/2020  6:21 AM      Passed - Last BP in normal range    BP Readings from Last 1 Encounters:  07/29/20 114/60         Passed - Valid encounter within last 12 months    Recent Outpatient Visits          4 months ago Type 2 diabetes mellitus with diabetic polyneuropathy, without long-term current use of insulin (HCC)   [facility] Maple Hudson., MD   7 months ago Type 2 diabetes mellitus with diabetic polyneuropathy, without long-term current use of insulin (HCC)   [facility] Maple Hudson., MD   11 months ago Type 2 diabetes mellitus with diabetic polyneuropathy, without long-term current use of insulin (HCC)   [facility] Maple Hudson., MD   1 year ago Controlled type 2 diabetes mellitus with complication, without long-term current use of insulin [facility])   [facility] Maple [facility]., MD   1 year ago Need for influenza vaccination   [facility] Maple Hudson., MD      Future Appointments            In 3 weeks Maple Hudson., MD [facility], [facility]           . esomeprazole (NEXIUM) 40 MG capsule [Pharmacy Med Name: ESOMEPRAZOLE MAGNESIUM 40MG  DR CAPS] 90 capsule     Sig: TAKE 1 CAPSULE BY MOUTH EVERY DAY     Gastroenterology: Proton Pump Inhibitors Passed - 09/07/2020  6:21 AM      Passed - Valid encounter within last 12 months    Recent Outpatient Visits          4 months ago Type 2 diabetes mellitus with diabetic polyneuropathy, without long-term current use of insulin [facility])   [facility] MD   7 months ago Type 2 diabetes mellitus with diabetic  polyneuropathy, without long-term current use of insulin [facility])   [facility]., MD   11 months ago Type 2 diabetes mellitus with diabetic polyneuropathy, without long-term current use of insulin [facility])   [facility]., MD   1 year ago Controlled type 2 diabetes mellitus with complication, without long-term current use of insulin [facility])   [facility]., MD   1 year ago Need for influenza vaccination   [facility]., MD      Future Appointments            In 3 weeks Maple Hudson., MD [facility], [facility]

## 2020-09-09 ENCOUNTER — Ambulatory Visit: Payer: Self-pay

## 2020-09-09 NOTE — Telephone Encounter (Signed)
Pt. States she has had dizziness and fatigue x 1 week. Has noticed she "bleeds easy if I get injured." No chest pain or shortness of breath. Would like to see Dr. Sullivan Lone in " a day or two." Prefers to see Dr. Sullivan Lone. No availability with PCP. Please advise pt.  Reason for Disposition . [1] MODERATE dizziness (e.g., interferes with normal activities) AND [2] has NOT been evaluated by physician for this  (Exception: dizziness caused by heat exposure, sudden standing, or poor fluid intake)  Answer Assessment - Initial Assessment Questions 1. DESCRIPTION: "Describe your dizziness."     Lightheaded 2. LIGHTHEADED: "Do you feel lightheaded?" (e.g., somewhat faint, woozy, weak upon standing)     Yes 3. VERTIGO: "Do you feel like either you or the room is spinning or tilting?" (i.e. vertigo)     No 4. SEVERITY: "How bad is it?"  "Do you feel like you are going to faint?" "Can you stand and walk?"   - MILD: Feels slightly dizzy, but walking normally.   - MODERATE: Feels very unsteady when walking, but not falling; interferes with normal activities (e.g., school, work) .   - SEVERE: Unable to walk without falling, or requires assistance to walk without falling; feels like passing out now.      Mild 5. ONSET:  "When did the dizziness begin?"     A few days ago 6. AGGRAVATING FACTORS: "Does anything make it worse?" (e.g., standing, change in head position)     Walikng 7. HEART RATE: "Can you tell me your heart rate?" "How many beats in 15 seconds?"  (Note: not all patients can do this)       No 8. CAUSE: "What do you think is causing the dizziness?"     Unsure 9. RECURRENT SYMPTOM: "Have you had dizziness before?" If Yes, ask: "When was the last time?" "What happened that time?"     No 10. OTHER SYMPTOMS: "Do you have any other symptoms?" (e.g., fever, chest pain, vomiting, diarrhea, bleeding)       Feels tired 11. PREGNANCY: "Is there any chance you are pregnant?" "When was your last menstrual  period?"       No  Protocols used: DIZZINESS [facility]

## 2020-09-09 NOTE — Telephone Encounter (Signed)
Can see anyone in office. Renee Carpenter will not be back until Monday

## 2020-09-10 ENCOUNTER — Emergency Department: Payer: Medicare PPO

## 2020-09-10 ENCOUNTER — Other Ambulatory Visit: Payer: Self-pay

## 2020-09-10 ENCOUNTER — Emergency Department
Admission: EM | Admit: 2020-09-10 | Discharge: 2020-09-10 | Disposition: A | Discharge status: Home / Self Care | Payer: Medicare PPO | Attending: Emergency Medicine | Admitting: Emergency Medicine

## 2020-09-10 DIAGNOSIS — R42 Dizziness and giddiness: Secondary | ICD-10-CM | POA: Diagnosis not present

## 2020-09-10 DIAGNOSIS — R041 Hemorrhage from throat: Secondary | ICD-10-CM | POA: Diagnosis not present

## 2020-09-10 DIAGNOSIS — R0602 Shortness of breath: Secondary | ICD-10-CM | POA: Diagnosis not present

## 2020-09-10 DIAGNOSIS — Z7982 Long term (current) use of aspirin: Secondary | ICD-10-CM | POA: Insufficient documentation

## 2020-09-10 DIAGNOSIS — R531 Weakness: Secondary | ICD-10-CM | POA: Insufficient documentation

## 2020-09-10 DIAGNOSIS — Z79899 Other long term (current) drug therapy: Secondary | ICD-10-CM | POA: Diagnosis not present

## 2020-09-10 DIAGNOSIS — R4182 Altered mental status, unspecified: Secondary | ICD-10-CM | POA: Diagnosis not present

## 2020-09-10 DIAGNOSIS — Z7984 Long term (current) use of oral hypoglycemic drugs: Secondary | ICD-10-CM | POA: Insufficient documentation

## 2020-09-10 DIAGNOSIS — N183 Chronic kidney disease, stage 3 unspecified: Secondary | ICD-10-CM | POA: Insufficient documentation

## 2020-09-10 DIAGNOSIS — Z794 Long term (current) use of insulin: Secondary | ICD-10-CM | POA: Diagnosis not present

## 2020-09-10 DIAGNOSIS — I130 Hypertensive heart and chronic kidney disease with heart failure and stage 1 through stage 4 chronic kidney disease, or unspecified chronic kidney disease: Secondary | ICD-10-CM | POA: Diagnosis not present

## 2020-09-10 DIAGNOSIS — I5022 Chronic systolic (congestive) heart failure: Secondary | ICD-10-CM | POA: Insufficient documentation

## 2020-09-10 DIAGNOSIS — N3000 Acute cystitis without hematuria: Secondary | ICD-10-CM | POA: Insufficient documentation

## 2020-09-10 DIAGNOSIS — R5383 Other fatigue: Secondary | ICD-10-CM | POA: Diagnosis not present

## 2020-09-10 DIAGNOSIS — E1122 Type 2 diabetes mellitus with diabetic chronic kidney disease: Secondary | ICD-10-CM | POA: Diagnosis not present

## 2020-09-10 DIAGNOSIS — J449 Chronic obstructive pulmonary disease, unspecified: Secondary | ICD-10-CM | POA: Diagnosis not present

## 2020-09-10 DIAGNOSIS — E119 Type 2 diabetes mellitus without complications: Secondary | ICD-10-CM | POA: Diagnosis not present

## 2020-09-10 LAB — URINALYSIS, COMPLETE (UACMP) WITH MICROSCOPIC
Bilirubin Urine: NEGATIVE
Glucose, UA: NEGATIVE mg/dL
Ketones, ur: NEGATIVE mg/dL
Nitrite: NEGATIVE
Protein, ur: 30 mg/dL — AB
Specific Gravity, Urine: 1.012 (ref 1.005–1.030)
WBC, UA: >50 WBC/hpf — ABNORMAL HIGH (ref 0–5)
pH: 7 (ref 5.0–8.0)

## 2020-09-10 LAB — TROPONIN I (HIGH SENSITIVITY)
Troponin I (High Sensitivity): 30 ng/L — ABNORMAL HIGH (ref <18)
Troponin I (High Sensitivity): 31 ng/L — ABNORMAL HIGH (ref <18)

## 2020-09-10 LAB — CBC
HCT: 34.7 % — ABNORMAL LOW (ref 36.0–46.0)
Hemoglobin: 11.8 g/dL — ABNORMAL LOW (ref 12.0–15.0)
MCH: 39.6 pg — ABNORMAL HIGH (ref 26.0–34.0)
MCHC: 34 g/dL (ref 30.0–36.0)
MCV: 116.4 fL — ABNORMAL HIGH (ref 80.0–100.0)
Platelets: 280 10*3/uL (ref 150–400)
RBC: 2.98 MIL/uL — ABNORMAL LOW (ref 3.87–5.11)
RDW: 17.3 % — ABNORMAL HIGH (ref 11.5–15.5)
WBC: 9.2 10*3/uL (ref 4.0–10.5)
nRBC: 0.2 % (ref 0.0–0.2)

## 2020-09-10 LAB — BASIC METABOLIC PANEL
Anion gap: 6 (ref 5–15)
BUN: 22 mg/dL (ref 8–23)
CO2: 30 mmol/L (ref 22–32)
Calcium: 9.3 mg/dL (ref 8.9–10.3)
Chloride: 102 mmol/L (ref 98–111)
Creatinine, Ser: 1.59 mg/dL — ABNORMAL HIGH (ref 0.44–1.00)
GFR, Estimated: 30 mL/min — ABNORMAL LOW (ref >60)
Glucose, Bld: 164 mg/dL — ABNORMAL HIGH (ref 70–99)
Potassium: 4.4 mmol/L (ref 3.5–5.1)
Sodium: 138 mmol/L (ref 135–145)

## 2020-09-10 LAB — PROTIME-INR
INR: 1.1 (ref 0.8–1.2)
Prothrombin Time: 13.5 seconds (ref 11.4–15.2)

## 2020-09-10 LAB — APTT: aPTT: 28 seconds (ref 24–36)

## 2020-09-10 MED ORDER — ACETAMINOPHEN 500 MG PO TABS
1000.0000 mg | ORAL_TABLET | Freq: Once | ORAL | Status: AC
  Administered 2020-09-10: 1000 mg via ORAL
  Filled 2020-09-10: qty 2

## 2020-09-10 MED ORDER — CEPHALEXIN 500 MG PO CAPS: 500.0000 mg | ORAL_CAPSULE | Freq: Four times a day (QID) | ORAL | 0 refills | Status: AC

## 2020-09-10 MED ORDER — LACTATED RINGERS IV BOLUS
500.0000 mL | Freq: Once | INTRAVENOUS | Status: AC
  Administered 2020-09-10: 500 mL via INTRAVENOUS

## 2020-09-10 MED ORDER — CEPHALEXIN 500 MG PO CAPS
500.0000 mg | ORAL_CAPSULE | Freq: Once | ORAL | Status: AC
  Administered 2020-09-10: 500 mg via ORAL
  Filled 2020-09-10: qty 1

## 2020-09-10 NOTE — ED Provider Notes (Signed)
[facility] Emergency Department Provider Note ____________________________________________   Event Date/Time   First MD Initiated Contact with Patient 09/10/20 1828     (approximate)  I have reviewed the triage vital signs and the nursing notes.  HISTORY  Chief Complaint Weakness   HPI Renee Carpenter is a 87 y.o. femalewho presents to the ED for evaluation of   Chart review indicates HTN, DM on oral agents, CKD, obesity and OSA.  Patient was seen at our neighboring walk-in clinic with presenting complaints of lightheaded dizziness and was sent to the ED for further evaluation due to her age and comorbidities.  Patient is wheelchair-bound at baseline and resides at the [facility] independent living section, and has been there for 6 years.  She presents with a good friend who she has known for decades, and who provides additional history, for evaluation of 2 days of generalized weakness, dizziness and an episode of tongue bleeding.  Patient reports that for the past few days she has felt more dizzy and weak in a generalized fashion.  She denies any syncope, falls or injuries, chest pain, headache, abdominal pain, emesis, stool changes, dysuria or new incontinence.  She has difficulty elaborating upon her dizziness.  She further reports concern for an episode of her tongue bleeding 3 days ago.  She denies any known trauma, any accidentally biting her tongue, seizure disorder or seizure-like activity.  She reports it bled for a matter of minutes before self resolving and has not recurred.  She denies any other bleeding symptoms, including hematochezia, melena, vaginal bleeding, hematemesis, epistaxis.  Past Medical History:  Diagnosis Date  . Anemia    WAS ANEMIC  . Arthritis   . Blood transfusion    HAD 1 SEVERAL YRS AGO...3-4 YR  . Chronic kidney disease    SOME DECREASE IN KIDNEY FUNCTION  . Diabetes mellitus without complication ([place])   .  Hypertension   . Normal cardiac stress test    REQUESTING COPY FROM [facility]  . Normal echocardiogram    REQUESTING THAT RESULT -- DR [name]   . PONV (postoperative nausea and vomiting)   . Shortness of breath   . Spondylolysis 1998    Patient Active Problem List   Diagnosis Date Noted  . OSA (obstructive sleep apnea) 04/17/2020  . Chronic venous insufficiency 03/13/2019  . Lymphedema 03/13/2019  . RBD (REM behavioral disorder) 02/08/2019  . Parkinsonian features 12/07/2017  . Chronic systolic CHF (congestive heart failure), NYHA class 3 ([place]) 06/24/2017  . Chronic kidney disease, stage 3, mod decreased GFR (HCC) 04/15/2016  . Ds DNA antibody positive 04/15/2016  . Radicular pain of both lower extremities 04/13/2016  . Spine pain, lumbar 04/13/2016  . ANA positive 04/06/2016  . Right upper quadrant pain 09/11/2015  . Neuropathic pain 04/15/2015  . Cervical pain 01/24/2015  . Depression 01/01/2015  . Back pain, chronic 12/14/2014  . COPD, mild ([place]) 12/14/2014  . Anxiety, generalized 12/14/2014  . Esophageal reflux 12/14/2014  . Diabetes ([place]) 12/14/2014  . Progeria syndrome 12/14/2014  . BP (high blood pressure) 12/14/2014  . Adaptive colitis 12/14/2014  . Cannot sleep 12/14/2014  . Affective disorder, major 12/14/2014  . Chronic anemia 12/14/2014  . Arthritis, degenerative 12/14/2014  . OP (osteoporosis) 12/14/2014  . Restless leg 12/14/2014  . Neuralgia neuritis, sciatic nerve 12/14/2014  . 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 Lumbar canal stenosis 02/10/2012  . DDD (degenerative disc disease), lumbosacral 11/19/2011  .  Neuropathy ([place]) 02/26/2008  . Essential hypertension 02/26/2008  . Fatigue 02/26/2008  . DYSPNEA 02/26/2008    Past Surgical History:  Procedure Laterality Date  . ABDOMINAL HYSTERECTOMY    . BACK SURGERY     2 CERVICAL SURGERIES ALSO  . CATARACTS     RIGHT EYE CATARACT  . CERVICAL DISCECTOMY  08/1995 and  2011   C4-,C5-6, C6-7 anterior cervical; Dr. Ellene Route fusion and plating  . LAMINECTOMY     decompressive; Dr. Burman Riis in [place] MICRODISCECTOMY  04/14/2011   Procedure: LUMBAR LAMINECTOMY/DECOMPRESSION MICRODISCECTOMY;  Surgeon: [name];  Location: [facility];  Service: Neurosurgery;  Laterality: N/A;  Lumbar Three and Lumbar Four Laminectomy  . TONSILLECTOMY AND ADENOIDECTOMY  1939    Prior to Admission medications   Medication Sig Start Date End Date Taking? Authorizing Provider  cephALEXin (KEFLEX) 500 MG capsule Take 1 capsule (500 mg total) by mouth 4 (four) times daily for 5 days. 09/10/20 09/15/20 Yes Vladimir Crofts, MD  Accu-Chek FastClix Lancets MISC USE TO TEST BLOOD SUGAR UP TO 4 TIMES DAILY AS DIRECTED 01/28/20   Jerrol Banana., MD  ACCU-CHEK GUIDE test strip USE TO TEST BLOOD SUGAR UP TO 4 X DAILY 01/28/20   Jerrol Banana., MD  acetaminophen (TYLENOL) 500 MG tablet Take 500 mg by mouth every 6 (six) hours as needed. 07/16/16   [provider]  albuterol (VENTOLIN HFA) 108 (90 Base) MCG/ACT inhaler Inhale 2 puffs into the lungs every 4 (four) hours as needed for wheezing or shortness of breath. 01/21/20   Jerrol Banana., MD  ALPHA LIPOIC ACID PO Take by mouth daily.    [provider]  aspirin EC 81 MG tablet Take 81 mg by mouth daily.     [provider]  blood glucose meter kit and supplies KIT Dispense based on patient and insurance preference. Use up to four times daily as directed. (FOR ICD-9 250.00, 250.01). 07/25/18   Jerrol Banana., MD  Blood Glucose Monitoring Suppl (ACCU-CHEK GUIDE) w/Device KIT Check blood sugars daily as directed 08/04/20   Jerrol Banana., MD  carbidopa-levodopa (SINEMET IR) 25-100 MG tablet Take by mouth. Take 1 tablet three times a day 05/21/20 08/19/20  [provider]  Coenzyme Q10 (CO Q10) 200 MG CAPS Take by mouth daily.  08/09/11   [provider]  DULoxetine (CYMBALTA) 30 MG capsule Take 1 capsule (30 mg total) by mouth daily. 10/10/19   Jerrol Banana., MD  esomeprazole (NEXIUM) 40 MG capsule TAKE 1 CAPSULE BY MOUTH EVERY DAY 08/15/20   Jerrol Banana., MD  gabapentin (NEURONTIN) 600 MG tablet Take 1 tablet (600 mg total) by mouth 3 (three) times daily. 01/21/20   Jerrol Banana., MD  glimepiride (AMARYL) 4 MG tablet TAKE 1 TABLET(4 MG) BY MOUTH DAILY BEFORE BREAKFAST 04/01/20   Jerrol Banana., MD  hydroxypropyl methylcellulose (ISOPTO TEARS) 2.5 % ophthalmic solution Place 1 drop into both eyes 3 (three) times daily as needed. Dry eyes     [provider]  losartan (COZAAR) 25 MG tablet TAKE 1 TABLET BY MOUTH EVERY DAY Patient not taking: No sig reported 08/28/18   Jerrol Banana., MD  MAGNESIUM-OXIDE 400 (241.3 Mg) MG tablet TAKE 1 TABLET BY MOUTH TWICE DAILY 03/17/17   Jerrol Banana., MD  MULTIPLE VITAMIN PO Take by mouth daily.  08/09/11   [provider]  predniSONE (STERAPRED UNI-PAK 21 TAB) 10 MG (21) TBPK tablet Take as per directed Patient not taking: Reported on 07/29/2020 01/30/19   Jerrol Banana., MD  pregabalin (LYRICA) 25 MG capsule Take 1 capsule (25 mg total) by mouth 2 (two) times daily. 01/20/20   Jerrol Banana., MD  rOPINIRole (REQUIP) 1 MG tablet Take 1 mg by mouth 3 (three) times daily. 10/10/12   [provider]  ropinirole (REQUIP) 5 MG tablet Take 5 mg by mouth in the morning, at noon, in the evening, and at bedtime.    [provider]  senna-docusate (SENOKOT-S) 8.6-50 MG tablet Take 1 tablet by mouth daily as needed.  07/16/16   [provider]  tamsulosin (FLOMAX) 0.4 MG CAPS capsule TAKE 1 CAPSULE(0.4 MG) BY MOUTH DAILY 09/07/20   Jerrol Banana., MD    Allergies Acetaminophen, Aspirin, Metformin and related, Other, Sulfa antibiotics, Sulfasalazine, Tizanidine, Caffeine, Ginger, and  Ibuprofen  Family History  Problem Relation Age of Onset  . Heart failure Father   . Heart disease Father   . Hypertension Mother   . Diabetes Mother   . Heart disease Mother   . Cancer Sister        breast  . Breast cancer Sister 50  . Cancer Maternal Grandfather   . Cancer Maternal Aunt        x3  . Breast cancer Daughter 52    Social History Social History   Tobacco Use  . Smoking status: Never Smoker  . Smokeless tobacco: Never Used  Substance Use Topics  . Alcohol use: No  . Drug use: No    Review of Systems  Constitutional: No fever/chills.  Positive generalized weakness and dizziness Eyes: No visual changes. ENT: No sore throat. Cardiovascular: Denies chest pain. Respiratory: Denies shortness of breath. Gastrointestinal: No abdominal pain.  No nausea, no vomiting.  No diarrhea.  No constipation. Genitourinary: Negative for dysuria. Musculoskeletal: Negative for back pain. Skin: Negative for rash. Neurological: Negative for headaches, focal weakness or numbness.  ____________________________________________   PHYSICAL EXAM:  VITAL SIGNS: Vitals:   09/10/20 2100 09/10/20 2130  BP: (!) 165/83 (!) 148/83  Pulse: 83 86  Resp: 19 17  Temp:    SpO2: 100% 100%     Constitutional: Alert and oriented. Well appearing and in no acute distress. Eyes: Conjunctivae are normal. PERRL. EOMI. Head: Atraumatic. Nose: No congestion/rhinnorhea. Mouth/Throat: Mucous membranes are dry.  Oropharynx non-erythematous. Normal-appearing tongue without evidence of trauma, laceration or injury.  No bleeding noted throughout the oropharynx or gums. Neck: No stridor. No cervical spine tenderness to palpation. Cardiovascular: Normal rate, regular rhythm. Grossly normal heart sounds.  Good peripheral circulation. Respiratory: Normal respiratory effort.  No retractions. Lungs CTAB. Gastrointestinal: Soft , nondistended, nontender to palpation. No CVA  tenderness. Musculoskeletal: No lower extremity tenderness nor edema.  No joint effusions. No signs of acute trauma. Neurologic:  Normal speech and language. No gross focal neurologic deficits are appreciated.Cranial nerves II through XII intact 5/5 strength and sensation in all 4 extremities Skin:  Skin is warm, dry and intact. No rash noted. Psychiatric: Mood and affect are normal. Speech and behavior are normal.  ____________________________________________   LABS (all labs ordered are listed, but only abnormal results are displayed)  Labs Reviewed  BASIC METABOLIC PANEL - Abnormal; Notable for the following components:      Result Value   Glucose, Bld 164 (*)    Creatinine, Ser 1.59 (*)  GFR, Estimated 30 (*)    All other components within normal limits  CBC - Abnormal; Notable for the following components:   RBC 2.98 (*)    Hemoglobin 11.8 (*)    HCT 34.7 (*)    MCV 116.4 (*)    MCH 39.6 (*)    RDW 17.3 (*)    All other components within normal limits  URINALYSIS, COMPLETE (UACMP) WITH MICROSCOPIC - Abnormal; Notable for the following components:   Color, Urine YELLOW (*)    APPearance HAZY (*)    Hgb urine dipstick SMALL (*)    Protein, ur 30 (*)    Leukocytes,Ua MODERATE (*)    WBC, UA >50 (*)    Bacteria, UA RARE (*)    All other components within normal limits  TROPONIN I (HIGH SENSITIVITY) - Abnormal; Notable for the following components:   Troponin I (High Sensitivity) 30 (*)    All other components within normal limits  TROPONIN I (HIGH SENSITIVITY) - Abnormal; Notable for the following components:   Troponin I (High Sensitivity) 31 (*)    All other components within normal limits  URINE CULTURE  PROTIME-INR  APTT  CBG MONITORING, ED   ____________________________________________  12 Lead EKG  Sinus rhythm, rate of 85 bpm.  Rightward axis.  Left bundle branch block.  No evidence of acute ischemia per  [place] ____________________________________________  RADIOLOGY  ED MD interpretation: 2 view CXR reviewed by me without evidence of acute cardiopulmonary pathology. CT head reviewed by me without evidence of acute intracranial pathology.  Official radiology report(s): DG Chest 2 View  Result Date: 09/10/2020 CLINICAL DATA:  Shortness of breath EXAM: CHEST - 2 VIEW COMPARISON:  None. FINDINGS: The heart size and mediastinal contours are within normal limits. Aortic knob calcifications are seen. both lungs are clear. The visualized skeletal structures are unremarkable. IMPRESSION: No active cardiopulmonary disease. Electronically Signed   By: Prudencio Pair M.D.   On: 09/10/2020 17:18   CT Head Wo Contrast  Result Date: 09/10/2020 CLINICAL DATA:  Mental status changes EXAM: CT HEAD WITHOUT CONTRAST TECHNIQUE: Contiguous axial images were obtained from the base of the skull through the vertex without intravenous contrast. COMPARISON:  05/19/2020 FINDINGS: Brain: There is atrophy and chronic small vessel disease changes. No acute intracranial abnormality. Specifically, no hemorrhage, hydrocephalus, mass lesion, acute infarction, or significant intracranial injury. Vascular: No hyperdense vessel or unexpected calcification. Skull: No acute calvarial abnormality. Sinuses/Orbits: No acute findings Other: None IMPRESSION: Atrophy, chronic microvascular disease. No acute intracranial abnormality. Electronically Signed   By: Rolm Baptise M.D.   On: 09/10/2020 19:32    ____________________________________________   PROCEDURES and INTERVENTIONS  Procedure(s) performed (including Critical Care):  .1-3 Lead EKG Interpretation Performed by: Vladimir Crofts, MD Authorized by: Vladimir Crofts, MD     Interpretation: normal     ECG rate:  70   ECG rate assessment: normal     Rhythm: sinus rhythm     Ectopy: none     Conduction: normal      Medications  lactated ringers bolus 500 mL (0 mLs Intravenous  Stopped 09/10/20 2023)  acetaminophen (TYLENOL) tablet 1,000 mg (1,000 mg Oral Given 09/10/20 1939)  cephALEXin (KEFLEX) capsule 500 mg (500 mg Oral Given 09/10/20 2136)    ____________________________________________   MDM / ED COURSE   Quite functional 87 year old woman presents from a local independent living section with acute generalized weakness and presyncopal dizziness, likely due to acute cystitis, and amenable to outpatient management.  Normal  vitals on room air.  Exam demonstrates dry mucous membranes, suggestive of dehydration contributing to her dizziness, but otherwise unremarkable.  She is in no distress, has no evidence of neurovascular deficits or any trauma.  Benign abdomen.  No fevers, flank pain or leukocytosis to suggest pyelonephritis.  Urinalysis demonstrates many WBCs and was sent for culture, and patient was appeared to start on Keflex to treat for acute cystitis.  She reports resolving symptoms after small IV fluid bolus and oral fluids as well.  She tolerates these without difficulty or emesis.  CXR without infiltrates besides CAP to cause her dizziness, and CT head demonstrates no ICH or CVA.  I see no barriers to outpatient management and patient is stable at this time for discharge.  We discussed return precautions prior to her leaving.   Clinical Course as of 09/10/20 2338  Wed Sep 10, 2020  2122 Reassessed.  Patient ports feeling better after fluids.  We discussed UTI as a likely source of her symptoms.  We discussed outpatient management thereafter.  She is agreeable. [DS]    Clinical Course User Index [DS] Vladimir Crofts, MD    ____________________________________________   FINAL CLINICAL IMPRESSION(S) / ED DIAGNOSES  Final diagnoses:  Weakness  Dizziness  Acute cystitis without hematuria     ED Discharge Orders         Ordered    cephALEXin (KEFLEX) 500 MG capsule  4 times daily        09/10/20 2214           Kaeden Depaz   Note:  This document  was prepared using Dragon voice recognition software and may include unintentional dictation errors.   Vladimir Crofts, MD 09/10/20 [phone]

## 2020-09-10 NOTE — Telephone Encounter (Signed)
[facility] called again about this patient and patient was to be referred to ER or UC

## 2020-09-10 NOTE — Discharge Instructions (Signed)
You are being discharged a prescription for Keflex antibiotics to take 4 times daily for the next 5 days to treat a UTI/bladder infection that is likely contributing to your weakness and dizziness.  Drink plenty of water to stay hydrated.  Return to the ED with any fevers or worsening symptoms about these medications.  Use Tylenol for pain and fevers.  Up to 1000 mg per dose, up to 4 times per day.  Do not take more than 4000 mg of Tylenol/acetaminophen within 24 hours.Renee Carpenter

## 2020-09-10 NOTE — ED Triage Notes (Signed)
Pt coming from [facility]. Per staff pt is coming for SOB. After looking at d/c summary and speaking with pt. Pt is here for dizziness, weakness, fatigues, and bruising easily. Per staff, pt was orthostatic. Pt is A&Ox4 and NAD. Denies CP/SOB,

## 2020-09-12 LAB — URINE CULTURE: Culture: 40000 — AB

## 2020-09-22 ENCOUNTER — Ambulatory Visit: Payer: Self-pay | Admitting: Family Medicine

## 2020-09-25 DIAGNOSIS — G4733 Obstructive sleep apnea (adult) (pediatric): Secondary | ICD-10-CM | POA: Diagnosis not present

## 2020-09-29 NOTE — Progress Notes (Signed)
I,[name],acting as a scribe for Renee Durie, MD.,have documented all relevant documentation on the behalf of Renee Durie, MD,as directed by  Renee Durie, MD while in the presence of Renee Durie, MD.  Established patient visit   Patient: Renee Carpenter   DOB: [birth year]   87 y.o. Female  MRN: [id number] Visit Date: 09/30/2020  Today's healthcare provider: [name], MD   Chief Complaint  Patient presents with  . Follow-up  . Diabetes  . Hypertension   Subjective    HPI  Patient comes in today for follow-up today.  Overall she feels okay but she has chronic low back pain which is worse recently at night.  It is better the last couple of days.  Last A1c earlier this month was 8.0. She comes in in a wheelchair but is able to transfer to the chair in the room. Diabetes Mellitus Type II, follow-up  Lab Results  Component Value Date   HGBA1C 8.3 (A) 04/23/2020   HGBA1C 8.8 (A) 01/17/2020   HGBA1C 9.1 (A) 10/10/2019   Last seen for diabetes 5 months ago.  Management since then includes continuing the same treatment. She reports good compliance with treatment. She is not having side effects. none  Home blood sugar records: fasting range: not checking  Episodes of hypoglycemia? No none   Current insulin regiment: none Most Recent Eye Exam: 05/22/2020  --------------------------------------------------------------------  Hypertension, follow-up  BP Readings from Last 3 Encounters:  09/30/20 139/77  09/10/20 (!) 148/83  07/29/20 114/60   Wt Readings from Last 3 Encounters:  09/30/20 163 lb (73.9 kg)  09/10/20 179 lb (81.2 kg)  07/29/20 170 lb (77.1 kg)     She was last seen for hypertension 5 months ago.  BP at that visit was 110/59. Management since that visit includes; on losartan. She reports good compliance with treatment. She is not having side effects. none She is not exercising. She is not adherent to low  salt diet.   Outside blood pressures are not checking.  She does not smoke.  Use of agents associated with hypertension: none.   --------------------------------------------------------------------        Medications: Outpatient Medications Prior to Visit  Medication Sig  . Accu-Chek FastClix Lancets MISC USE TO TEST BLOOD SUGAR UP TO 4 TIMES DAILY AS DIRECTED  . ACCU-CHEK GUIDE test strip USE TO TEST BLOOD SUGAR UP TO 4 X DAILY  . acetaminophen (TYLENOL) 500 MG tablet Take 500 mg by mouth every 6 (six) hours as needed.  Marland Kitchen albuterol (VENTOLIN HFA) 108 (90 Base) MCG/ACT inhaler Inhale 2 puffs into the lungs every 4 (four) hours as needed for wheezing or shortness of breath.  . ALPHA LIPOIC ACID PO Take by mouth daily.  Marland Kitchen aspirin EC 81 MG tablet Take 81 mg by mouth daily.   . blood glucose meter kit and supplies KIT Dispense based on patient and insurance preference. Use up to four times daily as directed. (FOR ICD-9 250.00, 250.01).  . Coenzyme Q10 (CO Q10) 200 MG CAPS Take by mouth daily.   . DULoxetine (CYMBALTA) 30 MG capsule Take 1 capsule (30 mg total) by mouth daily.  Marland Kitchen esomeprazole (NEXIUM) 40 MG capsule TAKE 1 CAPSULE BY MOUTH EVERY DAY  . gabapentin (NEURONTIN) 600 MG tablet Take 1 tablet (600 mg total) by mouth 3 (three) times daily.  Marland Kitchen glimepiride (AMARYL) 4 MG tablet TAKE 1 TABLET(4 MG) BY MOUTH DAILY BEFORE BREAKFAST  .  hydroxypropyl methylcellulose (ISOPTO TEARS) 2.5 % ophthalmic solution Place 1 drop into both eyes 3 (three) times daily as needed. Dry eyes   . MAGNESIUM-OXIDE 400 (241.3 Mg) MG tablet TAKE 1 TABLET BY MOUTH TWICE DAILY  . MULTIPLE VITAMIN PO Take by mouth daily.   . pregabalin (LYRICA) 25 MG capsule Take 1 capsule (25 mg total) by mouth 2 (two) times daily.  Marland Kitchen rOPINIRole (REQUIP) 1 MG tablet Take 1 mg by mouth 3 (three) times daily.  . ropinirole (REQUIP) 5 MG tablet Take 5 mg by mouth in the morning, at noon, in the evening, and at bedtime.  .  senna-docusate (SENOKOT-S) 8.6-50 MG tablet Take 1 tablet by mouth daily as needed.   . tamsulosin (FLOMAX) 0.4 MG CAPS capsule TAKE 1 CAPSULE(0.4 MG) BY MOUTH DAILY  . Blood Glucose Monitoring Suppl (ACCU-CHEK GUIDE) w/Device KIT Check blood sugars daily as directed (Patient not taking: Reported on 09/30/2020)  . carbidopa-levodopa (SINEMET IR) 25-100 MG tablet Take by mouth. Take 1 tablet three times a day  . losartan (COZAAR) 25 MG tablet TAKE 1 TABLET BY MOUTH EVERY DAY (Patient not taking: No sig reported)  . predniSONE (STERAPRED UNI-PAK 21 TAB) 10 MG (21) TBPK tablet Take as per directed (Patient not taking: No sig reported)   No facility-administered medications prior to visit.    Review of Systems  Constitutional: Negative for appetite change, chills, fatigue and fever.  Respiratory: Negative for chest tightness and shortness of breath.   Cardiovascular: Negative for chest pain and palpitations.  Gastrointestinal: Negative for abdominal pain, nausea and vomiting.  Neurological: Negative for dizziness and weakness.        Objective    BP 139/77 (BP Location: Left Arm, Patient Position: Sitting, Cuff Size: Large)   Pulse 88   Temp 98.8 F (37.1 C) (Oral)   Resp 18   Ht $R'5\' 4"'gx$  (1.607 m)   Wt 163 lb (73.9 kg)   SpO2 93%   BMI 27.98 kg/m  BP Readings from Last 3 Encounters:  09/30/20 139/77  09/10/20 (!) 148/83  07/29/20 114/60   Wt Readings from Last 3 Encounters:  09/30/20 163 lb (73.9 kg)  09/10/20 179 lb (81.2 kg)  07/29/20 170 lb (77.1 kg)       Physical Exam Vitals reviewed.  Constitutional:      Comments: Elderly white female in no acute distress who appears a little bit younger than her age.  HENT:     Head: Normocephalic and atraumatic.     Right Ear: Tympanic membrane, ear canal and external ear normal.     Left Ear: Tympanic membrane, ear canal and external ear normal.  Eyes:     General: No scleral icterus.    Conjunctiva/sclera: Conjunctivae normal.   Neck:     Vascular: No carotid bruit.  Cardiovascular:     Rate and Rhythm: Normal rate and regular rhythm.     Heart sounds: Normal heart sounds.  Pulmonary:     Breath sounds: Normal breath sounds.  Abdominal:     Palpations: Abdomen is soft. There is no mass.     Comments: Normal abdominal exam.  Musculoskeletal:     Cervical back: No muscular tenderness.     Right lower leg: Edema present.     Left lower leg: Edema present.     Comments: Signifcant thoracic kyphosis. Trace LE edema.Lymphedema. Minimal tenderness over the left SI joint area. No tenderness over the spine itself.  Lymphadenopathy:     Cervical:  No cervical adenopathy.  Skin:    General: Skin is warm and dry.  Neurological:     General: No focal deficit present.     Mental Status: She is alert and oriented to person, place, and time.  Psychiatric:        Mood and Affect: Mood normal.        Behavior: Behavior normal.        Thought Content: Thought content normal.        Judgment: Judgment normal.       No results found for any visits on 09/30/20.  Assessment & Plan     1. Type 2 diabetes mellitus with diabetic polyneuropathy, without long-term current use of insulin (HCC) A1c earlier this month was 8.0.  Repeat in 3 months.  2. Essential hypertension Good control.  3. DDD (degenerative disc disease), lumbosacral , Low back pain which is chronic.  Try Lidoderm patches presently.  She is slowly becoming less and less mobile. - lidocaine (LIDODERM) 5 %; Place 1 patch onto the skin daily. To Area Of Pain. Remove & Discard patch within 12 hours or as directed by MD  Dispense: 30 patch; Refill: 5   Return in about 3 months (around 12/30/2020).      I, Renee Durie, MD, have reviewed all documentation for this visit. The documentation on 10/04/20 for the exam, diagnosis, procedures, and orders are all accurate and complete.    Thermon Zulauf Cranford Mon, MD  [facility] [phone]  (phone) [phone] (fax)  [place]

## 2020-09-30 ENCOUNTER — Other Ambulatory Visit: Payer: Self-pay

## 2020-09-30 ENCOUNTER — Encounter: Payer: Self-pay | Admitting: Family Medicine

## 2020-09-30 ENCOUNTER — Ambulatory Visit (INDEPENDENT_AMBULATORY_CARE_PROVIDER_SITE_OTHER): Payer: Medicare PPO | Admitting: Family Medicine

## 2020-09-30 VITALS — BP 139/77 | HR 88 | Temp 98.8°F | Resp 18 | Ht 64.0 in | Wt 163.0 lb

## 2020-09-30 DIAGNOSIS — M5137 Other intervertebral disc degeneration, lumbosacral region: Secondary | ICD-10-CM | POA: Diagnosis not present

## 2020-09-30 DIAGNOSIS — I10 Essential (primary) hypertension: Secondary | ICD-10-CM | POA: Diagnosis not present

## 2020-09-30 DIAGNOSIS — E1142 Type 2 diabetes mellitus with diabetic polyneuropathy: Secondary | ICD-10-CM | POA: Diagnosis not present

## 2020-09-30 MED ORDER — LIDOCAINE 5 % EX PTCH
1.0000 | MEDICATED_PATCH | CUTANEOUS | 5 refills | Status: DC
Start: 2020-09-30 — End: 2021-04-12

## 2020-10-13 ENCOUNTER — Telehealth (INDEPENDENT_AMBULATORY_CARE_PROVIDER_SITE_OTHER): Payer: Medicare PPO

## 2020-10-13 DIAGNOSIS — R300 Dysuria: Secondary | ICD-10-CM | POA: Diagnosis not present

## 2020-10-13 LAB — POCT URINALYSIS DIPSTICK
Bilirubin, UA: NEGATIVE
Glucose, UA: NEGATIVE
Ketones, UA: NEGATIVE
Leukocytes, UA: NEGATIVE
Nitrite, UA: NEGATIVE
Protein, UA: NEGATIVE
Spec Grav, UA: 1.02 (ref 1.010–1.025)
Urobilinogen, UA: 0.2 E.U./dL
pH, UA: 6.5 (ref 5.0–8.0)

## 2020-10-13 NOTE — Telephone Encounter (Signed)
Copied from CRM [id number]. Topic: General - Call Back - No Documentation >> Oct 13, 2020 12:10 PM Randol Kern wrote: [phone] Pt is requesting a call back, wants to know if she can still bring her urine sample in to the office.

## 2020-10-13 NOTE — Telephone Encounter (Signed)
Patient dropped off urine

## 2020-10-14 DIAGNOSIS — M48062 Spinal stenosis, lumbar region with neurogenic claudication: Secondary | ICD-10-CM | POA: Diagnosis not present

## 2020-10-14 DIAGNOSIS — Z741 Need for assistance with personal care: Secondary | ICD-10-CM | POA: Diagnosis not present

## 2020-10-14 DIAGNOSIS — R262 Difficulty in walking, not elsewhere classified: Secondary | ICD-10-CM | POA: Diagnosis not present

## 2020-10-14 DIAGNOSIS — M6281 Muscle weakness (generalized): Secondary | ICD-10-CM | POA: Diagnosis not present

## 2020-10-14 DIAGNOSIS — M5416 Radiculopathy, lumbar region: Secondary | ICD-10-CM | POA: Diagnosis not present

## 2020-10-15 NOTE — Addendum Note (Signed)
Addended by: Anson [place] on: 10/15/2020 04:35 PM   Modules accepted: Orders

## 2020-10-22 NOTE — Progress Notes (Signed)
MRN : [id number]  Renee Carpenter is a 87 y.o. (10/05/1925) female who presents with chief complaint of No chief complaint on file. Marland Kitchen  History of Present Illness:   The patient returns to the office for followup evaluation regarding leg swelling.    Today she is also noting a significant increase in left leg pain.  This is different from the neuropathic pain she has in her feet.  Because of her neuropathy wearing compression is difficult however she manages to wear them for several hours a day.  This seems to control her swelling well at this time.  The pain associated with swelling is essentially eliminated. There have not been any interval development of a ulcerations or wounds.  No episodes of cellulitis or infection over the past 12 months  The patient denies problems with the lymph pump, she notes it is working well and the leggings are in good condition.    Since the previous visit the patient has been wearing graduated compression stockings  on a routine basis and  has noted significant improvement in the lymphedema.  Previous ABIs showed normal right lower extremity with mild atherosclerotic changes of the left lower extremity.  No outpatient medications have been marked as taking for the 10/23/20 encounter (Appointment) with Gilda Crease, Latina Craver, MD.    Past Medical History:  Diagnosis Date  . Anemia    WAS ANEMIC  . Arthritis   . Blood transfusion    HAD 1 SEVERAL YRS AGO...3-4 YR  . Chronic kidney disease    SOME DECREASE IN KIDNEY FUNCTION  . Diabetes mellitus without complication (HCC)   . Hypertension   . Normal cardiac stress test    REQUESTING COPY FROM [facility]  . Normal echocardiogram    REQUESTING THAT RESULT -- DR Park Breed   . PONV (postoperative nausea and vomiting)   . Shortness of breath   . Spondylolysis 1998    Past Surgical History:  Procedure Laterality Date  . ABDOMINAL HYSTERECTOMY    . BACK SURGERY     2 CERVICAL SURGERIES ALSO  .  CATARACTS     RIGHT EYE CATARACT  . CERVICAL DISCECTOMY  08/1995 and 2011   C4-,C5-6, C6-7 anterior cervical; Dr. Danielle Dess fusion and plating  . LAMINECTOMY     decompressive; Dr. Polo Riley in [place]  . LUMBAR LAMINECTOMY/DECOMPRESSION MICRODISCECTOMY  04/14/2011   Procedure: LUMBAR LAMINECTOMY/DECOMPRESSION MICRODISCECTOMY;  Surgeon: Cristi Loron;  Location: [facility];  Service: Neurosurgery;  Laterality: N/A;  Lumbar Three and Lumbar Four Laminectomy  . TONSILLECTOMY AND ADENOIDECTOMY  1939    Social History Social History   Tobacco Use  . Smoking status: Never Smoker  . Smokeless tobacco: Never Used  Substance Use Topics  . Alcohol use: No  . Drug use: No    Family History Family History  Problem Relation Age of Onset  . Heart failure Father   . Heart disease Father   . Hypertension Mother   . Diabetes Mother   . Heart disease Mother   . Cancer Sister        breast  . Breast cancer Sister 56  . Cancer Maternal Grandfather   . Cancer Maternal Aunt        x3  . Breast cancer Daughter 23    Allergies  Allergen Reactions  . Acetaminophen Itching and Other (See Comments)    Stomach upset  . Aspirin Itching and Other (See Comments)    Stomach pain  . Metformin  And Related     nausea  . Other Other (See Comments)    Novocaine, strange feeling  . Sulfa Antibiotics Other (See Comments)  . Sulfasalazine     Other reaction(s): Other (See Comments)  . Tizanidine     Diarrhea, almost passed out, felt dizzy and confused  . Caffeine Palpitations  . Ginger Palpitations  . Ibuprofen Rash     REVIEW OF SYSTEMS (Negative unless checked)  Constitutional: [] Weight loss  [] Fever  [] Chills Cardiac: [] Chest pain   [] Chest pressure   [] Palpitations   [] Shortness of breath when laying flat   [] Shortness of breath with exertion. Vascular:  [] Pain in legs with walking   [x] Pain in legs at rest  [] History of DVT   [] Phlebitis   [x] Swelling in legs   [] Varicose veins    [] Non-healing ulcers Pulmonary:   [] Uses home oxygen   [] Productive cough   [] Hemoptysis   [] Wheeze  [] COPD   [] Asthma Neurologic:  [] Dizziness   [] Seizures   [] History of stroke   [] History of TIA  [] Aphasia   [] Vissual changes   [] Weakness or numbness in arm   [x] Weakness or numbness in leg Musculoskeletal:   [] Joint swelling   [x] Joint pain   [] Low back pain Hematologic:  [] Easy bruising  [] Easy bleeding   [] Hypercoagulable state   [] Anemic Gastrointestinal:  [] Diarrhea   [] Vomiting  [] Gastroesophageal reflux/heartburn   [] Difficulty swallowing. Genitourinary:  [] Chronic kidney disease   [] Difficult urination  [] Frequent urination   [] Blood in urine Skin:  [] Rashes   [] Ulcers  Psychological:  [] History of anxiety   []  History of major depression.  Physical Examination  There were no vitals filed for this visit. There is no height or weight on file to calculate BMI. Gen: WD/WN, NAD seen in a wheelchair head: [place]/AT, No temporalis wasting.  Ear/Nose/Throat: Hearing grossly intact, nares w/o erythema or drainage Eyes: PER, EOMI, sclera nonicteric.  Neck: Supple, no large masses.   Pulmonary:  Good air movement, no audible wheezing bilaterally, no use of accessory muscles.  Cardiac: RRR, no JVD Vascular: scattered varicosities present bilaterally.  Mild venous stasis changes to the legs bilaterally.  2+ soft pitting edema left trace edema of the right Vessel Right Left  Radial Palpable Palpable  PT  trace palpable  not palpable  DP  trace palpable  not palpable  Gastrointestinal: Non-distended. No guarding/no peritoneal signs.  Musculoskeletal: M/S 5/5 throughout.  No deformity or atrophy.  Neurologic: CN 2-12 intact. Symmetrical.  Speech is fluent. Motor exam as listed above. Psychiatric: Judgment intact, Mood & affect appropriate for pt's clinical situation. Dermatologic: Venous rashes no ulcers noted.  No changes consistent with cellulitis. Lymph : No lichenification or skin changes of  chronic lymphedema.  CBC Lab Results  Component Value Date   WBC 9.2 09/10/2020   HGB 11.8 (L) 09/10/2020   HCT 34.7 (L) 09/10/2020   MCV 116.4 (H) 09/10/2020   PLT 280 09/10/2020    BMET    Component Value Date/Time   NA 138 09/10/2020 1626   NA 145 (H) 01/17/2020 1500   NA 139 02/06/2014 1958   K 4.4 09/10/2020 1626   K 3.9 02/06/2014 1958   CL 102 09/10/2020 1626   CL 103 02/06/2014 1958   CO2 30 09/10/2020 1626   CO2 30 02/06/2014 1958   GLUCOSE 164 (H) 09/10/2020 1626   GLUCOSE 123 (H) 02/06/2014 1958   BUN 22 09/10/2020 1626   BUN 21 01/17/2020 1500   BUN 20 (H)  02/06/2014 1958   CREATININE 1.59 (H) 09/10/2020 1626   CREATININE 1.12 02/06/2014 1958   CALCIUM 9.3 09/10/2020 1626   CALCIUM 9.8 02/06/2014 1958   GFRNONAA 30 (L) 09/10/2020 1626   GFRNONAA 44 (L) 02/06/2014 1958   GFRAA 44 (L) 01/17/2020 1500   GFRAA 52 (L) 02/06/2014 1958   CrCl cannot be calculated (Patient's most recent lab result is older than the maximum 21 days allowed.).  COAG Lab Results  Component Value Date   INR 1.1 09/10/2020   INR 1.0 05/19/2020    Radiology No results found.   Assessment/Plan 1. Pain of left lower extremity Recommend:  Patient should undergo arterial duplex of the lower extremity ASAP because there has been a worsening of the patient's left lower extremity symptoms.  The patient states they are having increased pain.  The risks and benefits as well as the alternatives were discussed in detail with the patient.  All questions were answered.  Patient agrees to proceed and understands this could be a prelude to angiography and intervention.  The patient will follow up with me in the office to review the studies.   - VAS [place] ABI WITH/WO TBI; Future  2. Lymphedema  No surgery or intervention at this point in time.    I have reviewed my discussion with the patient regarding lymphedema and why it  causes symptoms.  Patient will continue wearing graduated  compression stockings class 1 (20-30 mmHg) on a daily basis a prescription was given. The patient is reminded to put the stockings on first thing in the morning and removing them in the evening. The patient is instructed specifically not to sleep in the stockings.   In addition, behavioral modification throughout the day will be continued.  This will include frequent elevation (such as in a recliner), use of over the counter pain medications as needed and exercise such as walking.  I have reviewed systemic causes for chronic edema such as liver, kidney and cardiac etiologies and there does not appear to be any significant changes in these organ systems over the past year.  The patient is under the impression that these organ systems are all stable and unchanged.    The patient will continue aggressive use of the  lymph pump.  This will continue to improve the edema control and prevent sequela such as ulcers and infections.   The patient will follow-up with me on an annual basis.    3. Chronic venous insufficiency  No surgery or intervention at this point in time.    I have reviewed my discussion with the patient regarding lymphedema and why it  causes symptoms.  Patient will continue wearing graduated compression stockings class 1 (20-30 mmHg) on a daily basis a prescription was given. The patient is reminded to put the stockings on first thing in the morning and removing them in the evening. The patient is instructed specifically not to sleep in the stockings.   In addition, behavioral modification throughout the day will be continued.  This will include frequent elevation (such as in a recliner), use of over the counter pain medications as needed and exercise such as walking.  I have reviewed systemic causes for chronic edema such as liver, kidney and cardiac etiologies and there does not appear to be any significant changes in these organ systems over the past year.  The patient is under the  impression that these organ systems are all stable and unchanged.    The patient will continue  aggressive use of the  lymph pump.  This will continue to improve the edema control and prevent sequela such as ulcers and infections.   The patient will follow-up with me on an annual basis.    4. Essential hypertension Continue antihypertensive medications as already ordered, these medications have been reviewed and there are no changes at this time.   5. COPD, mild (HCC) Continue pulmonary medications and aerosols as already ordered, these medications have been reviewed and there are no changes at this time.    6. Type 2 diabetes mellitus with diabetic polyneuropathy, without long-term current use of insulin (HCC) Continue hypoglycemic medications as already ordered, these medications have been reviewed and there are no changes at this time.  Hgb A1C to be monitored as already arranged by primary service    Levora DredgeGregory Doy Taaffe, MD  10/22/2020 2:27 PM

## 2020-10-23 ENCOUNTER — Other Ambulatory Visit: Payer: Self-pay

## 2020-10-23 ENCOUNTER — Ambulatory Visit (INDEPENDENT_AMBULATORY_CARE_PROVIDER_SITE_OTHER): Payer: Medicare PPO | Admitting: Vascular Surgery

## 2020-10-23 ENCOUNTER — Encounter (INDEPENDENT_AMBULATORY_CARE_PROVIDER_SITE_OTHER): Payer: Self-pay | Admitting: Vascular Surgery

## 2020-10-23 VITALS — BP 102/61 | HR 83 | Resp 16

## 2020-10-23 DIAGNOSIS — I890 Lymphedema, not elsewhere classified: Secondary | ICD-10-CM

## 2020-10-23 DIAGNOSIS — J449 Chronic obstructive pulmonary disease, unspecified: Secondary | ICD-10-CM | POA: Diagnosis not present

## 2020-10-23 DIAGNOSIS — E1142 Type 2 diabetes mellitus with diabetic polyneuropathy: Secondary | ICD-10-CM

## 2020-10-23 DIAGNOSIS — I872 Venous insufficiency (chronic) (peripheral): Secondary | ICD-10-CM

## 2020-10-23 DIAGNOSIS — M79605 Pain in left leg: Secondary | ICD-10-CM

## 2020-10-23 DIAGNOSIS — I10 Essential (primary) hypertension: Secondary | ICD-10-CM | POA: Diagnosis not present

## 2020-10-23 DIAGNOSIS — M79606 Pain in leg, unspecified: Secondary | ICD-10-CM | POA: Insufficient documentation

## 2020-10-25 DIAGNOSIS — G4733 Obstructive sleep apnea (adult) (pediatric): Secondary | ICD-10-CM | POA: Diagnosis not present

## 2020-10-29 DIAGNOSIS — L821 Other seborrheic keratosis: Secondary | ICD-10-CM | POA: Diagnosis not present

## 2020-10-29 DIAGNOSIS — L853 Xerosis cutis: Secondary | ICD-10-CM | POA: Diagnosis not present

## 2020-10-29 DIAGNOSIS — L814 Other melanin hyperpigmentation: Secondary | ICD-10-CM | POA: Diagnosis not present

## 2020-10-29 DIAGNOSIS — L570 Actinic keratosis: Secondary | ICD-10-CM | POA: Diagnosis not present

## 2020-11-10 DIAGNOSIS — E1140 Type 2 diabetes mellitus with diabetic neuropathy, unspecified: Secondary | ICD-10-CM | POA: Diagnosis not present

## 2020-11-10 DIAGNOSIS — B351 Tinea unguium: Secondary | ICD-10-CM | POA: Diagnosis not present

## 2020-11-11 ENCOUNTER — Other Ambulatory Visit: Payer: Self-pay | Admitting: Family Medicine

## 2020-11-11 DIAGNOSIS — K2100 Gastro-esophageal reflux disease with esophagitis, without bleeding: Secondary | ICD-10-CM

## 2020-11-20 ENCOUNTER — Other Ambulatory Visit (INDEPENDENT_AMBULATORY_CARE_PROVIDER_SITE_OTHER): Payer: Self-pay | Admitting: Vascular Surgery

## 2020-11-20 DIAGNOSIS — M79605 Pain in left leg: Secondary | ICD-10-CM

## 2020-11-20 DIAGNOSIS — I739 Peripheral vascular disease, unspecified: Secondary | ICD-10-CM

## 2020-11-22 ENCOUNTER — Other Ambulatory Visit: Payer: Self-pay | Admitting: Family Medicine

## 2020-11-22 DIAGNOSIS — K2100 Gastro-esophageal reflux disease with esophagitis, without bleeding: Secondary | ICD-10-CM

## 2020-11-22 NOTE — Telephone Encounter (Signed)
last RF 11/11/20 #90

## 2020-11-24 ENCOUNTER — Encounter (INDEPENDENT_AMBULATORY_CARE_PROVIDER_SITE_OTHER): Payer: Medicare PPO

## 2020-11-24 ENCOUNTER — Ambulatory Visit (INDEPENDENT_AMBULATORY_CARE_PROVIDER_SITE_OTHER): Payer: Medicare PPO | Admitting: Vascular Surgery

## 2020-11-24 ENCOUNTER — Encounter (INDEPENDENT_AMBULATORY_CARE_PROVIDER_SITE_OTHER): Payer: Self-pay | Admitting: Vascular Surgery

## 2020-11-25 DIAGNOSIS — G4733 Obstructive sleep apnea (adult) (pediatric): Secondary | ICD-10-CM | POA: Diagnosis not present

## 2020-11-26 ENCOUNTER — Ambulatory Visit: Payer: Self-pay

## 2020-11-26 ENCOUNTER — Telehealth: Payer: Self-pay

## 2020-11-26 NOTE — Telephone Encounter (Signed)
Pt. States she has had loose bowel movements for "months". Stool is loose. Not frequent. "But when I go I can hardly get to the bathroom." No blood, but "sometimes it looks black." Mild abdominal pain. No other symptoms. Would like to be seen soon as possible. Please advise.

## 2020-11-26 NOTE — Telephone Encounter (Signed)
patient experiencing diarrhea for several months off and on.PCP has no available appointments sent a CRM to the practice regarding any cancellations. Patient seeking clinical advice until she can get work in  Answer Assessment - Initial Assessment Questions 1. DIARRHEA SEVERITY: "How bad is the diarrhea?" "How many more stools have you had in the past 24 hours than normal?"    - NO DIARRHEA (SCALE 0)   - MILD (SCALE 1-3): Few loose or mushy BMs; increase of 1-3 stools over normal daily number of stools; mild increase in ostomy output.   -  MODERATE (SCALE 4-7): Increase of 4-6 stools daily over normal; moderate increase in ostomy output. * SEVERE (SCALE 8-10; OR 'WORST POSSIBLE'): Increase of 7 or more stools daily over normal; moderate increase in ostomy output; incontinence.     Mild 2. ONSET: "When did the diarrhea begin?"      Months 3. BM CONSISTENCY: "How loose or watery is the diarrhea?"      Loose 4. VOMITING: "Are you also vomiting?" If Yes, ask: "How many times in the past 24 hours?"      No 5. ABDOMINAL PAIN: "Are you having any abdominal pain?" If Yes, ask: "What does it feel like?" (e.g., crampy, dull, intermittent, constant)      Yes 6. ABDOMINAL PAIN SEVERITY: If present, ask: "How bad is the pain?"  (e.g., Scale 1-10; mild, moderate, or severe)   - MILD (1-3): doesn't interfere with normal activities, abdomen soft and not tender to touch    - MODERATE (4-7): interferes with normal activities or awakens from sleep, abdomen tender to touch    - SEVERE (8-10): excruciating pain, doubled over, unable to do any normal activities       Side on right 7. ORAL INTAKE: If vomiting, "Have you been able to drink liquids?" "How much liquids have you had in the past 24 hours?"     Yes 8. HYDRATION: "Any signs of dehydration?" (e.g., dry mouth [not just dry lips], too weak to stand, dizziness, new weight loss) "When did you last urinate?"     No 9. EXPOSURE: "Have you traveled to a foreign  country recently?" "Have you been exposed to anyone with diarrhea?" "Could you have eaten any food that was spoiled?"     No 10. ANTIBIOTIC USE: "Are you taking antibiotics now or have you taken antibiotics in the past 2 months?"       No 11. OTHER SYMPTOMS: "Do you have any other symptoms?" (e.g., fever, blood in stool)       No 12. PREGNANCY: "Is there any chance you are pregnant?" "When was your last menstrual period?"       No  Protocols used: [facility]

## 2020-11-26 NOTE — Telephone Encounter (Signed)
Copied from CRM [id number]. Topic: Appointment Scheduling - Scheduling Inquiry for Clinic >> Nov 26, 2020  3:23 PM Elliot Gault wrote: Reason for CRM: patient requesting acute appointment for diarrhea, it has been happening for several months off and on. Any cancellations or work ins at another practice please reach out

## 2020-11-27 NOTE — Telephone Encounter (Signed)
Tried calling patient to advise UC or Telemed, due to no availability. Line was busy. Will try again later.

## 2020-11-27 NOTE — Telephone Encounter (Signed)
Tried calling patient, line was busy. Will try again later. 

## 2020-11-27 NOTE — Telephone Encounter (Signed)
See other message

## 2020-11-29 DIAGNOSIS — M5416 Radiculopathy, lumbar region: Secondary | ICD-10-CM | POA: Diagnosis not present

## 2020-11-29 DIAGNOSIS — M48062 Spinal stenosis, lumbar region with neurogenic claudication: Secondary | ICD-10-CM | POA: Diagnosis not present

## 2020-11-29 DIAGNOSIS — Z741 Need for assistance with personal care: Secondary | ICD-10-CM | POA: Diagnosis not present

## 2020-11-29 DIAGNOSIS — M6281 Muscle weakness (generalized): Secondary | ICD-10-CM | POA: Diagnosis not present

## 2020-11-29 DIAGNOSIS — R262 Difficulty in walking, not elsewhere classified: Secondary | ICD-10-CM | POA: Diagnosis not present

## 2020-12-01 ENCOUNTER — Encounter (INDEPENDENT_AMBULATORY_CARE_PROVIDER_SITE_OTHER): Payer: Medicare PPO

## 2020-12-02 DIAGNOSIS — M5416 Radiculopathy, lumbar region: Secondary | ICD-10-CM | POA: Diagnosis not present

## 2020-12-02 DIAGNOSIS — M6281 Muscle weakness (generalized): Secondary | ICD-10-CM | POA: Diagnosis not present

## 2020-12-02 DIAGNOSIS — R262 Difficulty in walking, not elsewhere classified: Secondary | ICD-10-CM | POA: Diagnosis not present

## 2020-12-02 DIAGNOSIS — Z741 Need for assistance with personal care: Secondary | ICD-10-CM | POA: Diagnosis not present

## 2020-12-02 DIAGNOSIS — M48062 Spinal stenosis, lumbar region with neurogenic claudication: Secondary | ICD-10-CM | POA: Diagnosis not present

## 2020-12-02 NOTE — Telephone Encounter (Signed)
Returned call to patient. Advised below.

## 2020-12-04 DIAGNOSIS — M48062 Spinal stenosis, lumbar region with neurogenic claudication: Secondary | ICD-10-CM | POA: Diagnosis not present

## 2020-12-04 DIAGNOSIS — M5416 Radiculopathy, lumbar region: Secondary | ICD-10-CM | POA: Diagnosis not present

## 2020-12-04 DIAGNOSIS — M6281 Muscle weakness (generalized): Secondary | ICD-10-CM | POA: Diagnosis not present

## 2020-12-04 DIAGNOSIS — R262 Difficulty in walking, not elsewhere classified: Secondary | ICD-10-CM | POA: Diagnosis not present

## 2020-12-04 DIAGNOSIS — Z741 Need for assistance with personal care: Secondary | ICD-10-CM | POA: Diagnosis not present

## 2020-12-05 ENCOUNTER — Other Ambulatory Visit: Payer: Self-pay | Admitting: Family Medicine

## 2020-12-05 DIAGNOSIS — G629 Polyneuropathy, unspecified: Secondary | ICD-10-CM

## 2020-12-09 DIAGNOSIS — M5416 Radiculopathy, lumbar region: Secondary | ICD-10-CM | POA: Diagnosis not present

## 2020-12-09 DIAGNOSIS — M6281 Muscle weakness (generalized): Secondary | ICD-10-CM | POA: Diagnosis not present

## 2020-12-09 DIAGNOSIS — R262 Difficulty in walking, not elsewhere classified: Secondary | ICD-10-CM | POA: Diagnosis not present

## 2020-12-09 DIAGNOSIS — Z741 Need for assistance with personal care: Secondary | ICD-10-CM | POA: Diagnosis not present

## 2020-12-09 DIAGNOSIS — M48062 Spinal stenosis, lumbar region with neurogenic claudication: Secondary | ICD-10-CM | POA: Diagnosis not present

## 2020-12-12 DIAGNOSIS — Z741 Need for assistance with personal care: Secondary | ICD-10-CM | POA: Diagnosis not present

## 2020-12-12 DIAGNOSIS — M5416 Radiculopathy, lumbar region: Secondary | ICD-10-CM | POA: Diagnosis not present

## 2020-12-12 DIAGNOSIS — R262 Difficulty in walking, not elsewhere classified: Secondary | ICD-10-CM | POA: Diagnosis not present

## 2020-12-12 DIAGNOSIS — M48062 Spinal stenosis, lumbar region with neurogenic claudication: Secondary | ICD-10-CM | POA: Diagnosis not present

## 2020-12-12 DIAGNOSIS — M6281 Muscle weakness (generalized): Secondary | ICD-10-CM | POA: Diagnosis not present

## 2020-12-15 DIAGNOSIS — M6281 Muscle weakness (generalized): Secondary | ICD-10-CM | POA: Diagnosis not present

## 2020-12-15 DIAGNOSIS — Z741 Need for assistance with personal care: Secondary | ICD-10-CM | POA: Diagnosis not present

## 2020-12-15 DIAGNOSIS — M5416 Radiculopathy, lumbar region: Secondary | ICD-10-CM | POA: Diagnosis not present

## 2020-12-15 DIAGNOSIS — M48062 Spinal stenosis, lumbar region with neurogenic claudication: Secondary | ICD-10-CM | POA: Diagnosis not present

## 2020-12-15 DIAGNOSIS — R262 Difficulty in walking, not elsewhere classified: Secondary | ICD-10-CM | POA: Diagnosis not present

## 2020-12-16 DIAGNOSIS — R259 Unspecified abnormal involuntary movements: Secondary | ICD-10-CM | POA: Diagnosis not present

## 2020-12-16 DIAGNOSIS — G4752 REM sleep behavior disorder: Secondary | ICD-10-CM | POA: Diagnosis not present

## 2020-12-16 DIAGNOSIS — G2581 Restless legs syndrome: Secondary | ICD-10-CM | POA: Diagnosis not present

## 2020-12-16 DIAGNOSIS — G608 Other hereditary and idiopathic neuropathies: Secondary | ICD-10-CM | POA: Diagnosis not present

## 2020-12-18 DIAGNOSIS — R262 Difficulty in walking, not elsewhere classified: Secondary | ICD-10-CM | POA: Diagnosis not present

## 2020-12-18 DIAGNOSIS — M5416 Radiculopathy, lumbar region: Secondary | ICD-10-CM | POA: Diagnosis not present

## 2020-12-18 DIAGNOSIS — M6281 Muscle weakness (generalized): Secondary | ICD-10-CM | POA: Diagnosis not present

## 2020-12-18 DIAGNOSIS — M48062 Spinal stenosis, lumbar region with neurogenic claudication: Secondary | ICD-10-CM | POA: Diagnosis not present

## 2020-12-18 DIAGNOSIS — Z741 Need for assistance with personal care: Secondary | ICD-10-CM | POA: Diagnosis not present

## 2020-12-25 DIAGNOSIS — G4733 Obstructive sleep apnea (adult) (pediatric): Secondary | ICD-10-CM | POA: Diagnosis not present

## 2020-12-25 DIAGNOSIS — Z741 Need for assistance with personal care: Secondary | ICD-10-CM | POA: Diagnosis not present

## 2020-12-25 DIAGNOSIS — M48062 Spinal stenosis, lumbar region with neurogenic claudication: Secondary | ICD-10-CM | POA: Diagnosis not present

## 2020-12-25 DIAGNOSIS — R262 Difficulty in walking, not elsewhere classified: Secondary | ICD-10-CM | POA: Diagnosis not present

## 2020-12-25 DIAGNOSIS — M6281 Muscle weakness (generalized): Secondary | ICD-10-CM | POA: Diagnosis not present

## 2020-12-25 DIAGNOSIS — M5416 Radiculopathy, lumbar region: Secondary | ICD-10-CM | POA: Diagnosis not present

## 2020-12-31 ENCOUNTER — Encounter: Payer: Self-pay | Admitting: Family Medicine

## 2020-12-31 ENCOUNTER — Ambulatory Visit (INDEPENDENT_AMBULATORY_CARE_PROVIDER_SITE_OTHER): Payer: Medicare PPO | Admitting: Family Medicine

## 2020-12-31 ENCOUNTER — Other Ambulatory Visit: Payer: Self-pay

## 2020-12-31 VITALS — BP 130/73 | HR 83 | Temp 98.7°F | Resp 16 | Wt 168.0 lb

## 2020-12-31 DIAGNOSIS — E1142 Type 2 diabetes mellitus with diabetic polyneuropathy: Secondary | ICD-10-CM

## 2020-12-31 DIAGNOSIS — G4733 Obstructive sleep apnea (adult) (pediatric): Secondary | ICD-10-CM | POA: Diagnosis not present

## 2020-12-31 DIAGNOSIS — R259 Unspecified abnormal involuntary movements: Secondary | ICD-10-CM

## 2020-12-31 DIAGNOSIS — R197 Diarrhea, unspecified: Secondary | ICD-10-CM | POA: Diagnosis not present

## 2020-12-31 DIAGNOSIS — G2581 Restless legs syndrome: Secondary | ICD-10-CM

## 2020-12-31 DIAGNOSIS — G629 Polyneuropathy, unspecified: Secondary | ICD-10-CM

## 2020-12-31 DIAGNOSIS — I10 Essential (primary) hypertension: Secondary | ICD-10-CM

## 2020-12-31 DIAGNOSIS — I872 Venous insufficiency (chronic) (peripheral): Secondary | ICD-10-CM

## 2020-12-31 DIAGNOSIS — I5022 Chronic systolic (congestive) heart failure: Secondary | ICD-10-CM

## 2020-12-31 LAB — POCT GLYCOSYLATED HEMOGLOBIN (HGB A1C): Hemoglobin A1C: 8.1 % — AB (ref 4.0–5.6)

## 2020-12-31 NOTE — Progress Notes (Signed)
Established patient visit   Patient: Renee Carpenter   DOB: [birth year]   87 y.o. Female  MRN: [id number] Visit Date: 12/31/2020  Today's healthcare provider: [name], MD   Chief Complaint  Patient presents with   Diabetes   Hypertension   Tremors   Subjective    HPI  Patient comes in today for follow-up.  She is not sleeping well.  She is not using her CPAP. She has chronic neuropathy and chronic diarrhea.  She occasionally alternates of diarrhea with constipation. She mainly just transfers from a wheelchair with full-time attendants at her apartment. Diabetes Mellitus Type II, Follow-up  Lab Results  Component Value Date   HGBA1C 8.1 (A) 12/31/2020   HGBA1C 8.3 (A) 04/23/2020   HGBA1C 8.8 (A) 01/17/2020   Wt Readings from Last 3 Encounters:  12/31/20 168 lb (76.2 kg)  09/30/20 163 lb (73.9 kg)  09/10/20 179 lb (81.2 kg)   Last seen for diabetes 3 months ago.  Management since then includes no medication changes. She reports good compliance with treatment. She is not having side effects.  Symptoms: No fatigue No foot ulcerations  No appetite changes No nausea  No paresthesia of the feet  No polydipsia  No polyuria No visual disturbances   No vomiting     Home blood sugar records: trend: stable  Episodes of hypoglycemia? No    Current insulin regiment: none Most Recent Eye Exam: due Current exercise: none Current diet habits: well balanced  Pertinent Labs: Lab Results  Component Value Date   CHOL 149 08/26/2017   HDL 47 08/26/2017   LDLCALC 74 08/26/2017   TRIG 141 08/26/2017   CHOLHDL 3.2 08/26/2017   Lab Results  Component Value Date   NA 138 09/10/2020   K 4.4 09/10/2020   CREATININE 1.59 (H) 09/10/2020   GFRNONAA 30 (L) 09/10/2020   GFRAA 44 (L) 01/17/2020   GLUCOSE 164 (H) 09/10/2020     Hypertension, follow-up  BP Readings from Last 3 Encounters:  12/31/20 130/73  10/23/20 102/61  09/30/20 139/77   Wt Readings  from Last 3 Encounters:  12/31/20 168 lb (76.2 kg)  09/30/20 163 lb (73.9 kg)  09/10/20 179 lb (81.2 kg)     She was last seen for hypertension 3 months ago.  BP at that visit was 102/61. Management since that visit includes no medication changes.  She reports good compliance with treatment. She is not having side effects.  She is following a Regular diet. She is not exercising. She does not smoke.  Use of agents associated with hypertension: none.   Outside blood pressures are checked occasionally. Symptoms: No chest pain No chest pressure  No palpitations No syncope  No dyspnea No orthopnea  No paroxysmal nocturnal dyspnea Yes lower extremity edema   Pertinent labs: Lab Results  Component Value Date   CHOL 149 08/26/2017   HDL 47 08/26/2017   LDLCALC 74 08/26/2017   TRIG 141 08/26/2017   CHOLHDL 3.2 08/26/2017   Lab Results  Component Value Date   NA 138 09/10/2020   K 4.4 09/10/2020   CREATININE 1.59 (H) 09/10/2020   GFRNONAA 30 (L) 09/10/2020   GFRAA 44 (L) 01/17/2020   GLUCOSE 164 (H) 09/10/2020     The ASCVD Risk score Mikey Bussing DC Jr., et al., 2013) failed to calculate for the following reasons:   The 2013 ASCVD risk score is only valid for ages 18 to 6   Follow up  for DDD  The patient was last seen for this 3 months ago. Changes made at last visit include starting Lidoderm patches.  She reports good compliance with treatment. She feels that condition is Improved. She is not having side effects.        Medications: Outpatient Medications Prior to Visit  Medication Sig   Accu-Chek FastClix Lancets MISC USE TO TEST BLOOD SUGAR UP TO 4 TIMES DAILY AS DIRECTED   ACCU-CHEK GUIDE test strip USE TO TEST BLOOD SUGAR UP TO 4 X DAILY   acetaminophen (TYLENOL) 500 MG tablet Take 500 mg by mouth every 6 (six) hours as needed.   albuterol (VENTOLIN HFA) 108 (90 Base) MCG/ACT inhaler Inhale 2 puffs into the lungs every 4 (four) hours as needed for wheezing or  shortness of breath.   ALPHA LIPOIC ACID PO Take by mouth daily.   aspirin EC 81 MG tablet Take 81 mg by mouth daily.    blood glucose meter kit and supplies KIT Dispense based on patient and insurance preference. Use up to four times daily as directed. (FOR ICD-9 250.00, 250.01).   Coenzyme Q10 (CO Q10) 200 MG CAPS Take by mouth daily.    DULoxetine (CYMBALTA) 30 MG capsule Take 1 capsule (30 mg total) by mouth daily.   esomeprazole (NEXIUM) 40 MG capsule TAKE 1 CAPSULE BY MOUTH EVERY DAY   gabapentin (NEURONTIN) 600 MG tablet Take 1 tablet (600 mg total) by mouth 3 (three) times daily.   Blood Glucose Monitoring Suppl (ACCU-CHEK GUIDE) w/Device KIT Check blood sugars daily as directed (Patient not taking: No sig reported)   carbidopa-levodopa (SINEMET IR) 25-100 MG tablet Take by mouth. Take 1 tablet three times a day   glimepiride (AMARYL) 4 MG tablet TAKE 1 TABLET(4 MG) BY MOUTH DAILY BEFORE BREAKFAST   hydroxypropyl methylcellulose (ISOPTO TEARS) 2.5 % ophthalmic solution Place 1 drop into both eyes 3 (three) times daily as needed. Dry eyes    lidocaine (LIDODERM) 5 % Place 1 patch onto the skin daily. To Area Of Pain. Remove & Discard patch within 12 hours or as directed by MD   losartan (COZAAR) 25 MG tablet TAKE 1 TABLET BY MOUTH EVERY DAY (Patient not taking: No sig reported)   MAGNESIUM-OXIDE 400 (241.3 Mg) MG tablet TAKE 1 TABLET BY MOUTH TWICE DAILY   MULTIPLE VITAMIN PO Take by mouth daily.    predniSONE (STERAPRED UNI-PAK 21 TAB) 10 MG (21) TBPK tablet Take as per directed (Patient not taking: No sig reported)   pregabalin (LYRICA) 25 MG capsule TAKE 1 CAPSULE BY MOUTH TWICE DAILY   rOPINIRole (REQUIP) 0.5 MG tablet Take 0.5 mg by mouth in the morning, at noon, in the evening, and at bedtime.   ropinirole (REQUIP) 5 MG tablet Take 5 mg by mouth in the morning, at noon, in the evening, and at bedtime. (Patient not taking: Reported on 10/23/2020)   senna-docusate (SENOKOT-S) 8.6-50 MG  tablet Take 1 tablet by mouth daily as needed.    tamsulosin (FLOMAX) 0.4 MG CAPS capsule TAKE 1 CAPSULE(0.4 MG) BY MOUTH DAILY   No facility-administered medications prior to visit.    Review of Systems  Constitutional:  Negative for activity change and fatigue.  Respiratory:  Negative for cough and shortness of breath.   Cardiovascular:  Negative for chest pain, palpitations and leg swelling.  Endocrine: Negative for cold intolerance, heat intolerance, polydipsia, polyphagia and polyuria.  Musculoskeletal:  Positive for arthralgias. Negative for myalgias.  Neurological:  Positive for tremors.  Negative for dizziness, light-headedness and headaches.  Psychiatric/Behavioral:  Negative for self-injury, sleep disturbance and suicidal ideas. The patient is not nervous/anxious.        Objective    BP 130/73   Pulse 83   Temp 98.7 F (37.1 C)   Resp 16   Wt 168 lb (76.2 kg)   BMI 28.84 kg/m  BP Readings from Last 3 Encounters:  12/31/20 130/73  10/23/20 102/61  09/30/20 139/77   Wt Readings from Last 3 Encounters:  12/31/20 168 lb (76.2 kg)  09/30/20 163 lb (73.9 kg)  09/10/20 179 lb (81.2 kg)       Physical Exam Vitals reviewed.  Constitutional:      Comments: Elderly white female sitting in a wheelchair.  She is very alert and oriented.  HENT:     Head: Normocephalic and atraumatic.     Right Ear: Tympanic membrane, ear canal and external ear normal.     Left Ear: Tympanic membrane, ear canal and external ear normal.  Eyes:     General: No scleral icterus.    Conjunctiva/sclera: Conjunctivae normal.  Neck:     Vascular: No carotid bruit.  Cardiovascular:     Rate and Rhythm: Normal rate and regular rhythm.     Heart sounds: Normal heart sounds.  Pulmonary:     Breath sounds: Normal breath sounds.  Abdominal:     Palpations: Abdomen is soft. There is no mass.     Comments: Normal abdominal exam.  Musculoskeletal:     Cervical back: No muscular tenderness.      Right lower leg: Edema present.     Left lower leg: Edema present.     Comments: Signifcant thoracic kyphosis. Trace LE edema.Lymphedema.  Lymphadenopathy:     Cervical: No cervical adenopathy.  Skin:    General: Skin is warm and dry.  Neurological:     General: No focal deficit present.     Mental Status: She is alert and oriented to person, place, and time.  Psychiatric:        Mood and Affect: Mood normal.        Behavior: Behavior normal.        Thought Content: Thought content normal.        Judgment: Judgment normal.      Results for orders placed or performed in visit on 12/31/20  POCT glycosylated hemoglobin (Hb A1C)  Result Value Ref Range   Hemoglobin A1C 8.1 (A) 4.0 - 5.6 %   HbA1c POC (<> result, manual entry)     HbA1c, POC (prediabetic range)     HbA1c, POC (controlled diabetic range)      Assessment & Plan     1. Type 2 diabetes mellitus with diabetic polyneuropathy, without long-term current use of insulin (HCC) A1c adequately controlled at 8.1% for this 87 year old.  Continue glimepiride - POCT glycosylated hemoglobin (Hb A1C)  2. Essential hypertension Good control on losartan 25 mg  3. Neuropathy On gabapentin 600 mg 3 times daily  4. OSA (obstructive sleep apnea) Patient instructed to start using CPAP again at night.  She has had a hard time getting used to it.  5. Diarrhea, unspecified type Try probiotic daily and then GlycoLax if she gets constipated.  6. Parkinsonian features On Sinemet 3 times daily neurology  7. Restless leg On ropinirole for restless leg at 3 times daily dose  8. Chronic venous insufficiency   9. Chronic systolic CHF (congestive heart failure), NYHA class 3 (HCC) Clinically  stable   No follow-ups on file.      I, [name], MD, have reviewed all documentation for this visit. The documentation on 01/16/21 for the exam, diagnosis, procedures, and orders are all accurate and complete.    Maraya Gwilliam Cranford Mon, MD  [facility] [phone] (phone) [phone] (fax)  [place]

## 2020-12-31 NOTE — Patient Instructions (Signed)
Try OTC Probiotics to help with diarrhea. If it doesn't help, try glycolax over the counter once daily.   Use CPAP nightly.

## 2021-01-05 DIAGNOSIS — M5416 Radiculopathy, lumbar region: Secondary | ICD-10-CM | POA: Diagnosis not present

## 2021-01-05 DIAGNOSIS — R262 Difficulty in walking, not elsewhere classified: Secondary | ICD-10-CM | POA: Diagnosis not present

## 2021-01-05 DIAGNOSIS — Z741 Need for assistance with personal care: Secondary | ICD-10-CM | POA: Diagnosis not present

## 2021-01-05 DIAGNOSIS — M48062 Spinal stenosis, lumbar region with neurogenic claudication: Secondary | ICD-10-CM | POA: Diagnosis not present

## 2021-01-05 DIAGNOSIS — M6281 Muscle weakness (generalized): Secondary | ICD-10-CM | POA: Diagnosis not present

## 2021-01-08 DIAGNOSIS — M48062 Spinal stenosis, lumbar region with neurogenic claudication: Secondary | ICD-10-CM | POA: Diagnosis not present

## 2021-01-08 DIAGNOSIS — Z741 Need for assistance with personal care: Secondary | ICD-10-CM | POA: Diagnosis not present

## 2021-01-08 DIAGNOSIS — M5416 Radiculopathy, lumbar region: Secondary | ICD-10-CM | POA: Diagnosis not present

## 2021-01-08 DIAGNOSIS — M6281 Muscle weakness (generalized): Secondary | ICD-10-CM | POA: Diagnosis not present

## 2021-01-08 DIAGNOSIS — R262 Difficulty in walking, not elsewhere classified: Secondary | ICD-10-CM | POA: Diagnosis not present

## 2021-01-12 DIAGNOSIS — M48062 Spinal stenosis, lumbar region with neurogenic claudication: Secondary | ICD-10-CM | POA: Diagnosis not present

## 2021-01-12 DIAGNOSIS — Z741 Need for assistance with personal care: Secondary | ICD-10-CM | POA: Diagnosis not present

## 2021-01-12 DIAGNOSIS — M5416 Radiculopathy, lumbar region: Secondary | ICD-10-CM | POA: Diagnosis not present

## 2021-01-12 DIAGNOSIS — M6281 Muscle weakness (generalized): Secondary | ICD-10-CM | POA: Diagnosis not present

## 2021-01-12 DIAGNOSIS — R262 Difficulty in walking, not elsewhere classified: Secondary | ICD-10-CM | POA: Diagnosis not present

## 2021-01-15 ENCOUNTER — Telehealth: Payer: Self-pay | Admitting: Family Medicine

## 2021-01-15 NOTE — Telephone Encounter (Signed)
Pt called and stated that she would like a call back from the nurse regarding cologaurd. She states that he daughter received the test at her group home but thinks that it was meant for her. Pt would like a call back from the nurse to clear this up.  [phone]

## 2021-01-16 DIAGNOSIS — M5416 Radiculopathy, lumbar region: Secondary | ICD-10-CM | POA: Diagnosis not present

## 2021-01-16 DIAGNOSIS — M48062 Spinal stenosis, lumbar region with neurogenic claudication: Secondary | ICD-10-CM | POA: Diagnosis not present

## 2021-01-16 DIAGNOSIS — M6281 Muscle weakness (generalized): Secondary | ICD-10-CM | POA: Diagnosis not present

## 2021-01-16 DIAGNOSIS — R262 Difficulty in walking, not elsewhere classified: Secondary | ICD-10-CM | POA: Diagnosis not present

## 2021-01-16 DIAGNOSIS — Z741 Need for assistance with personal care: Secondary | ICD-10-CM | POA: Diagnosis not present

## 2021-01-16 NOTE — Telephone Encounter (Signed)
Tried calling patient X2 and no answer. Unable to leave VM due to phone kept ringing. Cologuard was meant for her daughter not her.

## 2021-01-16 NOTE — Telephone Encounter (Signed)
Pt returned call, understands information below. Requesting to speak with clinic

## 2021-01-16 NOTE — Telephone Encounter (Signed)
Returned call. No answer and no vm.

## 2021-01-19 DIAGNOSIS — M48062 Spinal stenosis, lumbar region with neurogenic claudication: Secondary | ICD-10-CM | POA: Diagnosis not present

## 2021-01-19 DIAGNOSIS — M5416 Radiculopathy, lumbar region: Secondary | ICD-10-CM | POA: Diagnosis not present

## 2021-01-19 DIAGNOSIS — R262 Difficulty in walking, not elsewhere classified: Secondary | ICD-10-CM | POA: Diagnosis not present

## 2021-01-19 DIAGNOSIS — Z741 Need for assistance with personal care: Secondary | ICD-10-CM | POA: Diagnosis not present

## 2021-01-19 DIAGNOSIS — M6281 Muscle weakness (generalized): Secondary | ICD-10-CM | POA: Diagnosis not present

## 2021-01-19 NOTE — Telephone Encounter (Signed)
Patient would like a follow up call regarding cologuard and states she thinks its for her daughter # MRN: [id number].

## 2021-01-20 NOTE — Telephone Encounter (Signed)
Called patient back and no answer. Unable to leave VM. Phone just kept ringing. Will try again later.

## 2021-01-25 DIAGNOSIS — G4733 Obstructive sleep apnea (adult) (pediatric): Secondary | ICD-10-CM | POA: Diagnosis not present

## 2021-01-26 DIAGNOSIS — G4733 Obstructive sleep apnea (adult) (pediatric): Secondary | ICD-10-CM | POA: Diagnosis not present

## 2021-01-27 DIAGNOSIS — Z741 Need for assistance with personal care: Secondary | ICD-10-CM | POA: Diagnosis not present

## 2021-01-27 DIAGNOSIS — M5416 Radiculopathy, lumbar region: Secondary | ICD-10-CM | POA: Diagnosis not present

## 2021-01-27 DIAGNOSIS — R262 Difficulty in walking, not elsewhere classified: Secondary | ICD-10-CM | POA: Diagnosis not present

## 2021-01-27 DIAGNOSIS — M6281 Muscle weakness (generalized): Secondary | ICD-10-CM | POA: Diagnosis not present

## 2021-01-27 DIAGNOSIS — M48062 Spinal stenosis, lumbar region with neurogenic claudication: Secondary | ICD-10-CM | POA: Diagnosis not present

## 2021-01-30 DIAGNOSIS — R262 Difficulty in walking, not elsewhere classified: Secondary | ICD-10-CM | POA: Diagnosis not present

## 2021-01-30 DIAGNOSIS — M48062 Spinal stenosis, lumbar region with neurogenic claudication: Secondary | ICD-10-CM | POA: Diagnosis not present

## 2021-01-30 DIAGNOSIS — M5416 Radiculopathy, lumbar region: Secondary | ICD-10-CM | POA: Diagnosis not present

## 2021-01-30 DIAGNOSIS — Z741 Need for assistance with personal care: Secondary | ICD-10-CM | POA: Diagnosis not present

## 2021-01-30 DIAGNOSIS — M6281 Muscle weakness (generalized): Secondary | ICD-10-CM | POA: Diagnosis not present

## 2021-02-03 DIAGNOSIS — M6281 Muscle weakness (generalized): Secondary | ICD-10-CM | POA: Diagnosis not present

## 2021-02-03 DIAGNOSIS — Z741 Need for assistance with personal care: Secondary | ICD-10-CM | POA: Diagnosis not present

## 2021-02-03 DIAGNOSIS — R262 Difficulty in walking, not elsewhere classified: Secondary | ICD-10-CM | POA: Diagnosis not present

## 2021-02-03 DIAGNOSIS — M5416 Radiculopathy, lumbar region: Secondary | ICD-10-CM | POA: Diagnosis not present

## 2021-02-03 DIAGNOSIS — M48062 Spinal stenosis, lumbar region with neurogenic claudication: Secondary | ICD-10-CM | POA: Diagnosis not present

## 2021-02-06 ENCOUNTER — Other Ambulatory Visit: Payer: Self-pay | Admitting: Family Medicine

## 2021-02-06 DIAGNOSIS — K2100 Gastro-esophageal reflux disease with esophagitis, without bleeding: Secondary | ICD-10-CM

## 2021-02-06 NOTE — Telephone Encounter (Signed)
Requested medication (s) are due for refill today: Yes  Requested medication (s) are on the active medication list: Yes  Last refill:  01/21/20  Future visit scheduled: Yes  Notes to clinic:  Prescription expired.    Requested Prescriptions  Pending Prescriptions Disp Refills   gabapentin (NEURONTIN) 600 MG tablet [Pharmacy Med Name: GABAPENTIN 600MG  TABLETS] 270 tablet 3    Sig: TAKE 1 TABLET(600 MG) BY MOUTH THREE TIMES DAILY     Neurology: Anticonvulsants - gabapentin Passed - 02/06/2021  8:56 AM      Passed - Valid encounter within last 12 months    Recent Outpatient Visits           1 month ago Type 2 diabetes mellitus with diabetic polyneuropathy, without long-term current use of insulin (HCC)   [facility] [place]   4 months ago Type 2 diabetes mellitus with diabetic polyneuropathy, without long-term current use of insulin (HCC)   [facility] [place]   9 months ago Type 2 diabetes mellitus with diabetic polyneuropathy, without long-term current use of insulin (HCC)   [facility] [place]   1 year ago Type 2 diabetes mellitus with diabetic polyneuropathy, without long-term current use of insulin (HCC)   [facility] [place]   1 year ago Type 2 diabetes mellitus with diabetic polyneuropathy, without long-term current use of insulin [facility])   [facility] MD       Future Appointments             In 2 months Maple Hudson., MD [facility]            Signed Prescriptions Disp Refills   esomeprazole (NEXIUM) 40 MG capsule 90 capsule 0    Sig: TAKE 1 CAPSULE BY MOUTH EVERY DAY     Gastroenterology: Proton Pump Inhibitors Passed - 02/06/2021  8:56 AM      Passed - Valid encounter within last 12 months    Recent Outpatient Visits           1 month ago Type 2 diabetes  mellitus with diabetic polyneuropathy, without long-term current use of insulin (HCC)   [facility] MD   4 months ago Type 2 diabetes mellitus with diabetic polyneuropathy, without long-term current use of insulin [facility])   [facility] MD   9 months ago Type 2 diabetes mellitus with diabetic polyneuropathy, without long-term current use of insulin [facility])   [facility] MD   1 year ago Type 2 diabetes mellitus with diabetic polyneuropathy, without long-term current use of insulin [facility])   [facility] MD   1 year ago Type 2 diabetes mellitus with diabetic polyneuropathy, without long-term current use of insulin [facility])   [facility] MD       Future Appointments             In 2 months Maple Hudson., MD [facility]

## 2021-02-10 ENCOUNTER — Telehealth: Payer: Self-pay

## 2021-02-10 NOTE — Telephone Encounter (Signed)
Please review. Thanks!  

## 2021-02-10 NOTE — Telephone Encounter (Signed)
Copied from CRM [id number]. Topic: General - Other >> Feb 10, 2021 12:10 PM Jaquita Rector A wrote: Reason for CRM: Patient called in to inform dr Sullivan Lone that she tested positive for Covid. Does not have any significant symptoms but needing to know if something can be prescribed please. Please advise Ph# [phone]

## 2021-02-12 ENCOUNTER — Other Ambulatory Visit: Payer: Self-pay | Admitting: *Deleted

## 2021-02-12 DIAGNOSIS — U071 COVID-19: Secondary | ICD-10-CM

## 2021-02-12 MED ORDER — MOLNUPIRAVIR EUA 200MG CAPSULE: 4.0000 | ORAL_CAPSULE | Freq: Two times a day (BID) | ORAL | 0 refills | Status: AC

## 2021-02-12 NOTE — Telephone Encounter (Signed)
Pt called stating that she has not received a response as of yet. She is requesting to have a nurse follow up with her regarding this. Please advise.

## 2021-02-12 NOTE — Telephone Encounter (Signed)
Please advise 

## 2021-02-13 NOTE — Telephone Encounter (Signed)
Left message to call back. Ok for [facility] to advise if patient calls back.

## 2021-02-19 ENCOUNTER — Other Ambulatory Visit: Payer: Self-pay | Admitting: Family Medicine

## 2021-02-20 DIAGNOSIS — E1140 Type 2 diabetes mellitus with diabetic neuropathy, unspecified: Secondary | ICD-10-CM | POA: Diagnosis not present

## 2021-02-20 DIAGNOSIS — B351 Tinea unguium: Secondary | ICD-10-CM | POA: Diagnosis not present

## 2021-02-25 DIAGNOSIS — G4733 Obstructive sleep apnea (adult) (pediatric): Secondary | ICD-10-CM | POA: Diagnosis not present

## 2021-02-26 DIAGNOSIS — G4733 Obstructive sleep apnea (adult) (pediatric): Secondary | ICD-10-CM | POA: Diagnosis not present

## 2021-03-13 DIAGNOSIS — Z0100 Encounter for examination of eyes and vision without abnormal findings: Secondary | ICD-10-CM | POA: Diagnosis not present

## 2021-03-13 DIAGNOSIS — H353131 Nonexudative age-related macular degeneration, bilateral, early dry stage: Secondary | ICD-10-CM | POA: Diagnosis not present

## 2021-03-13 LAB — HM DIABETES EYE EXAM

## 2021-03-25 DIAGNOSIS — Z741 Need for assistance with personal care: Secondary | ICD-10-CM | POA: Diagnosis not present

## 2021-03-25 DIAGNOSIS — R262 Difficulty in walking, not elsewhere classified: Secondary | ICD-10-CM | POA: Diagnosis not present

## 2021-03-25 DIAGNOSIS — M6281 Muscle weakness (generalized): Secondary | ICD-10-CM | POA: Diagnosis not present

## 2021-03-25 DIAGNOSIS — M5416 Radiculopathy, lumbar region: Secondary | ICD-10-CM | POA: Diagnosis not present

## 2021-03-25 DIAGNOSIS — M48062 Spinal stenosis, lumbar region with neurogenic claudication: Secondary | ICD-10-CM | POA: Diagnosis not present

## 2021-03-27 DIAGNOSIS — G4733 Obstructive sleep apnea (adult) (pediatric): Secondary | ICD-10-CM | POA: Diagnosis not present

## 2021-03-28 DIAGNOSIS — G4733 Obstructive sleep apnea (adult) (pediatric): Secondary | ICD-10-CM | POA: Diagnosis not present

## 2021-04-08 DIAGNOSIS — G608 Other hereditary and idiopathic neuropathies: Secondary | ICD-10-CM | POA: Diagnosis not present

## 2021-04-08 DIAGNOSIS — G2581 Restless legs syndrome: Secondary | ICD-10-CM | POA: Diagnosis not present

## 2021-04-08 DIAGNOSIS — G4752 REM sleep behavior disorder: Secondary | ICD-10-CM | POA: Diagnosis not present

## 2021-04-08 DIAGNOSIS — R259 Unspecified abnormal involuntary movements: Secondary | ICD-10-CM | POA: Diagnosis not present

## 2021-04-10 DIAGNOSIS — M5416 Radiculopathy, lumbar region: Secondary | ICD-10-CM | POA: Diagnosis not present

## 2021-04-10 DIAGNOSIS — R262 Difficulty in walking, not elsewhere classified: Secondary | ICD-10-CM | POA: Diagnosis not present

## 2021-04-10 DIAGNOSIS — M48062 Spinal stenosis, lumbar region with neurogenic claudication: Secondary | ICD-10-CM | POA: Diagnosis not present

## 2021-04-10 DIAGNOSIS — M6281 Muscle weakness (generalized): Secondary | ICD-10-CM | POA: Diagnosis not present

## 2021-04-10 DIAGNOSIS — Z741 Need for assistance with personal care: Secondary | ICD-10-CM | POA: Diagnosis not present

## 2021-04-12 ENCOUNTER — Encounter: Payer: Self-pay | Admitting: Emergency Medicine

## 2021-04-12 ENCOUNTER — Other Ambulatory Visit: Payer: Self-pay

## 2021-04-12 ENCOUNTER — Emergency Department: Payer: Medicare PPO

## 2021-04-12 ENCOUNTER — Emergency Department
Admission: EM | Admit: 2021-04-12 | Discharge: 2021-04-12 | Disposition: A | Discharge status: Home / Self Care | Payer: Medicare PPO | Attending: Emergency Medicine | Admitting: Emergency Medicine

## 2021-04-12 DIAGNOSIS — Z79899 Other long term (current) drug therapy: Secondary | ICD-10-CM | POA: Insufficient documentation

## 2021-04-12 DIAGNOSIS — R319 Hematuria, unspecified: Secondary | ICD-10-CM | POA: Diagnosis not present

## 2021-04-12 DIAGNOSIS — M1611 Unilateral primary osteoarthritis, right hip: Secondary | ICD-10-CM | POA: Diagnosis not present

## 2021-04-12 DIAGNOSIS — E119 Type 2 diabetes mellitus without complications: Secondary | ICD-10-CM | POA: Diagnosis not present

## 2021-04-12 DIAGNOSIS — Z7982 Long term (current) use of aspirin: Secondary | ICD-10-CM | POA: Diagnosis not present

## 2021-04-12 DIAGNOSIS — N183 Chronic kidney disease, stage 3 unspecified: Secondary | ICD-10-CM | POA: Insufficient documentation

## 2021-04-12 DIAGNOSIS — I5022 Chronic systolic (congestive) heart failure: Secondary | ICD-10-CM | POA: Insufficient documentation

## 2021-04-12 DIAGNOSIS — K5730 Diverticulosis of large intestine without perforation or abscess without bleeding: Secondary | ICD-10-CM | POA: Diagnosis not present

## 2021-04-12 DIAGNOSIS — N3091 Cystitis, unspecified with hematuria: Secondary | ICD-10-CM | POA: Diagnosis not present

## 2021-04-12 DIAGNOSIS — I130 Hypertensive heart and chronic kidney disease with heart failure and stage 1 through stage 4 chronic kidney disease, or unspecified chronic kidney disease: Secondary | ICD-10-CM | POA: Insufficient documentation

## 2021-04-12 DIAGNOSIS — Z7984 Long term (current) use of oral hypoglycemic drugs: Secondary | ICD-10-CM | POA: Diagnosis not present

## 2021-04-12 DIAGNOSIS — J449 Chronic obstructive pulmonary disease, unspecified: Secondary | ICD-10-CM | POA: Insufficient documentation

## 2021-04-12 DIAGNOSIS — N3090 Cystitis, unspecified without hematuria: Secondary | ICD-10-CM | POA: Diagnosis not present

## 2021-04-12 DIAGNOSIS — I700 Atherosclerosis of aorta: Secondary | ICD-10-CM | POA: Diagnosis not present

## 2021-04-12 LAB — CBC
HCT: 30.5 % — ABNORMAL LOW (ref 36.0–46.0)
Hemoglobin: 10.6 g/dL — ABNORMAL LOW (ref 12.0–15.0)
MCH: 41.1 pg — ABNORMAL HIGH (ref 26.0–34.0)
MCHC: 34.8 g/dL (ref 30.0–36.0)
MCV: 118.2 fL — ABNORMAL HIGH (ref 80.0–100.0)
Platelets: 220 10*3/uL (ref 150–400)
RBC: 2.58 MIL/uL — ABNORMAL LOW (ref 3.87–5.11)
RDW: 16.9 % — ABNORMAL HIGH (ref 11.5–15.5)
WBC: 10.2 10*3/uL (ref 4.0–10.5)
nRBC: 0.3 % — ABNORMAL HIGH (ref 0.0–0.2)

## 2021-04-12 LAB — BASIC METABOLIC PANEL
Anion gap: 9 (ref 5–15)
BUN: 24 mg/dL — ABNORMAL HIGH (ref 8–23)
CO2: 27 mmol/L (ref 22–32)
Calcium: 9 mg/dL (ref 8.9–10.3)
Chloride: 102 mmol/L (ref 98–111)
Creatinine, Ser: 1.37 mg/dL — ABNORMAL HIGH (ref 0.44–1.00)
GFR, Estimated: 36 mL/min — ABNORMAL LOW (ref >60)
Glucose, Bld: 214 mg/dL — ABNORMAL HIGH (ref 70–99)
Potassium: 5 mmol/L (ref 3.5–5.1)
Sodium: 138 mmol/L (ref 135–145)

## 2021-04-12 LAB — URINALYSIS, ROUTINE W REFLEX MICROSCOPIC
Bilirubin Urine: NEGATIVE
Glucose, UA: NEGATIVE mg/dL
Ketones, ur: 5 mg/dL — AB
Nitrite: NEGATIVE
Protein, ur: 30 mg/dL — AB
Specific Gravity, Urine: 1.01 (ref 1.005–1.030)
WBC, UA: >50 WBC/hpf — ABNORMAL HIGH (ref 0–5)
pH: 7 (ref 5.0–8.0)

## 2021-04-12 MED ORDER — LIDOCAINE 5 % EX PTCH: 1.0000 | MEDICATED_PATCH | Freq: Two times a day (BID) | CUTANEOUS | 0 refills | Status: AC

## 2021-04-12 MED ORDER — LIDOCAINE 5 % EX PTCH
1.0000 | MEDICATED_PATCH | CUTANEOUS | Status: DC
  Administered 2021-04-12: 1 via TRANSDERMAL
  Filled 2021-04-12: qty 1

## 2021-04-12 MED ORDER — SODIUM CHLORIDE 0.9 % IV SOLN
2.0000 g | Freq: Once | INTRAVENOUS | Status: AC
  Administered 2021-04-12: 2 g via INTRAVENOUS
  Filled 2021-04-12: qty 20

## 2021-04-12 MED ORDER — ACETAMINOPHEN 500 MG PO TABS
1000.0000 mg | ORAL_TABLET | Freq: Once | ORAL | Status: AC
  Administered 2021-04-12: 1000 mg via ORAL
  Filled 2021-04-12: qty 2

## 2021-04-12 MED ORDER — CEPHALEXIN 500 MG PO CAPS: 500.0000 mg | ORAL_CAPSULE | Freq: Two times a day (BID) | ORAL | 0 refills | Status: AC

## 2021-04-12 NOTE — ED Provider Notes (Signed)
[facility] Emergency Department Provider Note  ____________________________________________   Event Date/Time   First MD Initiated Contact with Patient 04/12/21 1744     (approximate)  I have reviewed the triage vital signs and the nursing notes.   HISTORY  Chief Complaint Hematuria    HPI Renee Carpenter is a 87 y.o. female with history of hypertension, anemia who comes in with concerns for blood.  Patient reports blood in her urine last night.  However she then told me that she was not sure where the blood was coming from.  She does report that her urine is little bit of cloudy but denies any other symptoms.  She does report a little bit of left flank pain although the triage note says right flank pain she is adamant that is the left side.  Denies any fevers, pain is mild, nothing makes it better or worse.          Past Medical History:  Diagnosis Date   Anemia    WAS ANEMIC   Arthritis    Blood transfusion    HAD 1 SEVERAL YRS AGO...3-4 YR   Chronic kidney disease    SOME DECREASE IN KIDNEY FUNCTION   Diabetes mellitus without complication (HCC)    Hypertension    Normal cardiac stress test    REQUESTING COPY FROM [facility]   Normal echocardiogram    REQUESTING THAT RESULT -- DR [name]    PONV (postoperative nausea and vomiting)    Shortness of breath    Spondylolysis 1998    Patient Active Problem List   Diagnosis Date Noted   Leg pain 10/23/2020   OSA (obstructive sleep apnea) 04/17/2020   Chronic venous insufficiency 03/13/2019   Lymphedema 03/13/2019   RBD (REM behavioral disorder) 02/08/2019   Parkinsonian features 15/17/6160   Chronic systolic CHF (congestive heart failure), NYHA class 3 ([place]) 06/24/2017   Chronic kidney disease, stage 3, mod decreased GFR (HCC) 04/15/2016   Ds DNA antibody positive 04/15/2016   Radicular pain of both lower extremities 04/13/2016   Spine pain, lumbar 04/13/2016   ANA positive  04/06/2016   Right upper quadrant pain 09/11/2015   Neuropathic pain 04/15/2015   Cervical pain 01/24/2015   Depression 01/01/2015   Back pain, chronic 12/14/2014   COPD, mild ([place]) 12/14/2014   Anxiety, generalized 12/14/2014   Esophageal reflux 12/14/2014   Diabetes ([place]) 12/14/2014   Progeria syndrome 12/14/2014   BP (high blood pressure) 12/14/2014   Adaptive colitis 12/14/2014   Cannot sleep 12/14/2014   Affective disorder, major 12/14/2014   Chronic anemia 12/14/2014   Arthritis, degenerative 12/14/2014   OP (osteoporosis) 12/14/2014   Restless leg 12/14/2014   Neuralgia neuritis, sciatic nerve 12/14/2014   Anterior fascicular with posterior fascicular block 09/10/2014   Lumbar canal stenosis 02/10/2012   DDD (degenerative disc disease), lumbosacral 11/19/2011   Neuropathy ([place]) 02/26/2008   Essential hypertension 02/26/2008   Fatigue 02/26/2008   DYSPNEA 02/26/2008    Past Surgical History:  Procedure Laterality Date   ABDOMINAL HYSTERECTOMY     BACK SURGERY     2 CERVICAL SURGERIES ALSO   CATARACTS     RIGHT EYE CATARACT   CERVICAL DISCECTOMY  08/1995 and 2011   C4-,C5-6, C6-7 anterior cervical; Dr. Ellene Route fusion and plating   LAMINECTOMY     decompressive; Dr. Burman Riis in [place] MICRODISCECTOMY  04/14/2011   Procedure: LUMBAR LAMINECTOMY/DECOMPRESSION MICRODISCECTOMY;  Surgeon: [name];  Location: [place]  NEURO ORS;  Service: Neurosurgery;  Laterality: N/A;  Lumbar Three and Lumbar Four Laminectomy   TONSILLECTOMY AND ADENOIDECTOMY  1939    Prior to Admission medications   Medication Sig Start Date End Date Taking? Authorizing Provider  Accu-Chek FastClix Lancets MISC USE TO TEST BLOOD SUGAR UP TO 4 TIMES DAILY AS DIRECTED 01/28/20   Jerrol Banana., MD  ACCU-CHEK GUIDE test strip USE TO TEST BLOOD SUGAR UP TO 4 X DAILY 01/28/20   Jerrol Banana., MD  acetaminophen (TYLENOL) 500 MG tablet Take 500 mg by mouth  every 6 (six) hours as needed. 07/16/16   [provider]  albuterol (VENTOLIN HFA) 108 (90 Base) MCG/ACT inhaler INHALE 2 PUFFS INTO THE LUNGS EVERY 4 HOURS AS NEEDED FOR WHEEZING OR SHORTNESS OF BREATH 02/19/21   Jerrol Banana., MD  ALPHA LIPOIC ACID PO Take by mouth daily.    [provider]  aspirin EC 81 MG tablet Take 81 mg by mouth daily.     [provider]  blood glucose meter kit and supplies KIT Dispense based on patient and insurance preference. Use up to four times daily as directed. (FOR ICD-9 250.00, 250.01). 07/25/18   Jerrol Banana., MD  Blood Glucose Monitoring Suppl (ACCU-CHEK GUIDE) w/Device KIT Check blood sugars daily as directed Patient not taking: No sig reported 08/04/20   Jerrol Banana., MD  carbidopa-levodopa (SINEMET IR) 25-100 MG tablet Take by mouth. Take 1 tablet three times a day 05/21/20 08/19/20  [provider]  Coenzyme Q10 (CO Q10) 200 MG CAPS Take by mouth daily.  08/09/11   [provider]  DULoxetine (CYMBALTA) 30 MG capsule Take 1 capsule (30 mg total) by mouth daily. 10/10/19   Jerrol Banana., MD  esomeprazole (NEXIUM) 40 MG capsule TAKE 1 CAPSULE BY MOUTH EVERY DAY 02/06/21   Jerrol Banana., MD  gabapentin (NEURONTIN) 600 MG tablet TAKE 1 TABLET(600 MG) BY MOUTH THREE TIMES DAILY 02/06/21   Jerrol Banana., MD  glimepiride (AMARYL) 4 MG tablet TAKE 1 TABLET(4 MG) BY MOUTH DAILY BEFORE BREAKFAST 11/11/20   Jerrol Banana., MD  hydroxypropyl methylcellulose (ISOPTO TEARS) 2.5 % ophthalmic solution Place 1 drop into both eyes 3 (three) times daily as needed. Dry eyes     [provider]  lidocaine (LIDODERM) 5 % Place 1 patch onto the skin daily. To Area Of Pain. Remove & Discard patch within 12 hours or as directed by MD 09/30/20   Jerrol Banana., MD  losartan (COZAAR) 25 MG tablet TAKE 1 TABLET BY MOUTH EVERY DAY Patient not taking: No sig reported 08/28/18    Jerrol Banana., MD  MAGNESIUM-OXIDE 400 (241.3 Mg) MG tablet TAKE 1 TABLET BY MOUTH TWICE DAILY 03/17/17   Jerrol Banana., MD  MULTIPLE VITAMIN PO Take by mouth daily.  08/09/11   [provider]  predniSONE (STERAPRED UNI-PAK 21 TAB) 10 MG (21) TBPK tablet Take as per directed Patient not taking: No sig reported 01/30/19   Jerrol Banana., MD  pregabalin (LYRICA) 25 MG capsule TAKE 1 CAPSULE BY MOUTH TWICE DAILY 12/09/20   Jerrol Banana., MD  rOPINIRole (REQUIP) 0.5 MG tablet Take 0.5 mg by mouth in the morning, at noon, in the evening, and at bedtime. 10/10/12   [provider]  ropinirole (REQUIP) 5 MG tablet Take 5 mg by mouth in the morning, at  noon, in the evening, and at bedtime. Patient not taking: Reported on 10/23/2020    [provider]  senna-docusate (SENOKOT-S) 8.6-50 MG tablet Take 1 tablet by mouth daily as needed.  07/16/16   [provider]  tamsulosin (FLOMAX) 0.4 MG CAPS capsule TAKE 1 CAPSULE(0.4 MG) BY MOUTH DAILY 09/07/20   Maple Hudson., MD    Allergies Acetaminophen, Aspirin, Metformin and related, Other, Sulfa antibiotics, Sulfasalazine, Tizanidine, Caffeine, Ginger, and Ibuprofen  Family History  Problem Relation Age of Onset   Heart failure Father    Heart disease Father    Hypertension Mother    Diabetes Mother    Heart disease Mother    Cancer Sister        breast   Breast cancer Sister 65   Cancer Maternal Grandfather    Cancer Maternal Aunt        x3   Breast cancer Daughter 85    Social History Social History   Tobacco Use   Smoking status: Never   Smokeless tobacco: Never  Substance Use Topics   Alcohol use: No   Drug use: No      Review of Systems Constitutional: No fever/chills Eyes: No visual changes. ENT: No sore throat. Cardiovascular: Denies chest pain. Respiratory: Denies shortness of breath. Gastrointestinal: No abdominal pain.  No nausea, no vomiting.  No  diarrhea.  No constipation. Genitourinary: Negative for dysuria.  Blood in urine Musculoskeletal: Pain Skin: Negative for rash. Neurological: Negative for headaches, focal weakness or numbness. All other ROS negative ____________________________________________   PHYSICAL EXAM:  VITAL SIGNS: ED Triage Vitals  Enc Vitals Group     BP 04/12/21 1223 122/62     Pulse Rate 04/12/21 1223 84     Resp 04/12/21 1223 20     Temp 04/12/21 1223 98.6 F (37 C)     Temp Source 04/12/21 1223 Oral     SpO2 04/12/21 1223 100 %     Weight 04/12/21 1220 160 lb (72.6 kg)     Height 04/12/21 1220 5\' 5"  (1.651 m)     Head Circumference --      Peak Flow --      Pain Score 04/12/21 1220 2     Pain Loc --      Pain Edu? --      Excl. in GC? --     Constitutional: Alert and oriented. Well appearing and in no acute distress. Eyes: Conjunctivae are normal. EOMI. Head: Atraumatic. Nose: No congestion/rhinnorhea. Mouth/Throat: Mucous membranes are moist.   Neck: No stridor. Trachea Midline. FROM Cardiovascular: Normal rate, regular rhythm. Grossly normal heart sounds.  Good peripheral circulation. Respiratory: Normal respiratory effort.  No retractions. Lungs CTAB. Gastrointestinal: Soft and nontender. No distention. No abdominal bruits.  Musculoskeletal: No lower extremity tenderness nor edema.  No joint effusions.  Reports a little bit of pain on the left side of her back. Neurologic:  Normal speech and language. No gross focal neurologic deficits are appreciated.  Skin:  Skin is warm, dry and intact. No rash noted. Psychiatric: Mood and affect are normal. Speech and behavior are normal. GU: 1 patient inserted to the vagina without evidence of blood.  1 finger inserted into the rectum with brown stool, Hemoccult negative  ____________________________________________   LABS (all labs ordered are listed, but only abnormal results are displayed)  Labs Reviewed  URINALYSIS, ROUTINE W REFLEX  MICROSCOPIC - Abnormal; Notable for the following components:      Result Value  Color, Urine YELLOW (*)    APPearance CLOUDY (*)    Hgb urine dipstick MODERATE (*)    Ketones, ur 5 (*)    Protein, ur 30 (*)    Leukocytes,Ua LARGE (*)    WBC, UA >50 (*)    Bacteria, UA MANY (*)    All other components within normal limits  BASIC METABOLIC PANEL - Abnormal; Notable for the following components:   Glucose, Bld 214 (*)    BUN 24 (*)    Creatinine, Ser 1.37 (*)    GFR, Estimated 36 (*)    All other components within normal limits  CBC - Abnormal; Notable for the following components:   RBC 2.58 (*)    Hemoglobin 10.6 (*)    HCT 30.5 (*)    MCV 118.2 (*)    MCH 41.1 (*)    RDW 16.9 (*)    nRBC 0.3 (*)    All other components within normal limits  URINE CULTURE   ____________________________________________   RADIOLOGY  Official radiology report(s): CT Renal Stone Study  Result Date: 04/12/2021 CLINICAL DATA:  Left flank pain and hematuria. EXAM: CT ABDOMEN AND PELVIS WITHOUT CONTRAST TECHNIQUE: Multidetector CT imaging of the abdomen and pelvis was performed following the standard protocol without IV contrast. COMPARISON:  CT February 20, 2008 FINDINGS: Lower chest: No acute abnormality.  Mitral annular calcifications. Hepatobiliary: Unremarkable noncontrast appearance of the hepatic parenchyma. Gallbladder is unremarkable. No biliary ductal dilation. Pancreas: No pancreatic ductal dilation or evidence of acute inflammation. Spleen: Within normal limits. Adrenals/Urinary Tract: Bilateral adrenal glands are unremarkable. No hydronephrosis. No renal, ureteral or bladder calculi visualized. Urinary bladder is unremarkable for degree of distension. Stomach/Bowel: No enteric contrast was administered. Stomach is decompressed limiting evaluation. Periampullary duodenal diverticulum. No pathologic dilation of small or large bowel. The appendix and terminal ileum appear normal. Colonic  diverticulosis without findings of acute diverticulitis. No evidence of acute bowel inflammation. Vascular/Lymphatic: Aortic and branch vessel atherosclerosis without abdominal aortic aneurysm. No pathologically enlarged abdominal or pelvic lymph nodes. Reproductive: Status post hysterectomy. No adnexal masses. Other: No pneumoperitoneum.  No abdominopelvic free fluid. Musculoskeletal: L3-S1 posterior spinal fusion hardware. Multilevel degenerative changes spine. Heterotopic ossification about the right hip. Mild right hip degenerative change. Heterotopic ossification about the left greater trochanter. No acute osseous abnormality. IMPRESSION: 1. No acute findings in the abdomen or pelvis. Specifically, no evidence of obstructive uropathy. 2. Colonic diverticulosis without findings of acute diverticulitis. 3. Aortic Atherosclerosis (ICD10-I70.0). Electronically Signed   By: Dahlia Bailiff M.D.   On: 04/12/2021 19:12    ____________________________________________   PROCEDURES  Procedure(s) performed (including Critical Care):  Procedures   ____________________________________________   INITIAL IMPRESSION / ASSESSMENT AND PLAN / ED COURSE  Renee Carpenter was evaluated in Emergency Department on 04/12/2021 for the symptoms described in the history of present illness. She was evaluated in the context of the global COVID-19 pandemic, which necessitated consideration that the patient might be at risk for infection with the SARS-CoV-2 virus that causes COVID-19. Institutional protocols and algorithms that pertain to the evaluation of patients at risk for COVID-19 are in a state of rapid change based on information released by regulatory bodies including the CDC and federal and state organizations. These policies and algorithms were followed during the patient's care in the ED.    Patient initially told triage that she had blood in her urine but when she is talking with me she states that she is not  sure where the  blood came from.  She reports more bleeding last night but today just a little bit of blood-tinged.  I did a vaginal exam without any blood noted.  I also did a rectal exam without any blood noted therefore I suspect that this is most likely coming from the urine.  We did a bladder scan to make sure no evidence of retention and this was also negative.  Patient did report having good urine output but prior to had a bladder scan.  I will do a CT scan just to make sure there is no signs of infected kidney stone.  Patient's oxygen level was slightly noted low in triage and I replaced the pulse ox on her here and had a great waveform of is 98%.  Urine looks consistent with UTI I suspect that she is having a little bit of hemorrhagic cystitis.  Her kidney function is around her baseline.  Her white count is normal and she has no other evidence of sepsis or bacteremia.  Her hemoglobin is slightly low but not much different than her previous.  CT imaging was negative.  Provided a copy of report for incidental findings.  At this time patient is feeling better.  Her urine culture is in process and she is given a dose of ceftriaxone and can be started on some oral antibiotics at home  Patient has a urologist to follow-up with.  We discussed cystoscopy not sure if that would be necessary given patient's age and this is most likely just from the UTI but they expressed understanding will discuss it further with her urologist with a.  Patient is feeling much better.  Denies any pain in her back feels comfortable with discharge home.  Caregiver is at bedside and also agrees with plan   They understand return precautions including retention, fevers or any other concerns      ____________________________________________   FINAL CLINICAL IMPRESSION(S) / ED DIAGNOSES   Final diagnoses:  Hemorrhagic cystitis      MEDICATIONS GIVEN DURING THIS VISIT:  Medications  lidocaine (LIDODERM) 5 % 1  patch (1 patch Transdermal Patch Applied 04/12/21 1837)  cefTRIAXone (ROCEPHIN) 2 g in sodium chloride 0.9 % 100 mL IVPB (0 g Intravenous Stopped 04/12/21 1927)  acetaminophen (TYLENOL) tablet 1,000 mg (1,000 mg Oral Given 04/12/21 1836)     ED Discharge Orders     None        Note:  This document was prepared using Dragon voice recognition software and may include unintentional dictation errors.    Vanessa Evergreen, MD 04/12/21 1944

## 2021-04-12 NOTE — ED Triage Notes (Signed)
Pt via POV from home. Pt states that he saw bright red blood in her urine last night. Pt states that her urine has also been cloudy. Denies any other urinary sym Pt also c/o R flank pain for the past week. Pt is A&OX4 and NAD.

## 2021-04-12 NOTE — ED Notes (Signed)
Bladder scan 88 ml

## 2021-04-12 NOTE — ED Notes (Signed)
Pw provided. Pt verbal consent for dc provided. Pt denies any questions. Pt assisted off unit via wheelchair pushed by guest.

## 2021-04-12 NOTE — Discharge Instructions (Addendum)
Start the antibiotics tomorrow.  She take Tylenol 1 g every 8 hours to help with pain and use lidocaine patches to help with pain.  CT scan as below.  We are starting you on some antibiotics for UTI.  Follow-up with your primary care doctor or urology in 2 to 3 days for urine culture results.  Also discussed with your urologist You have cystoscopy given blood.  If she is continue to have a little bit of blood here and there would not worry about it but if it becomes a severe amount of blood not going away or she is not urinating or having fevers  return to the ER to be reevaluated    IMPRESSION: 1. No acute findings in the abdomen or pelvis. Specifically, no evidence of obstructive uropathy. 2. Colonic diverticulosis without findings of acute diverticulitis. 3. Aortic Atherosclerosis (ICD10-I70.0).

## 2021-04-14 LAB — URINE CULTURE: Culture: >=100000 — AB

## 2021-04-21 DIAGNOSIS — M6281 Muscle weakness (generalized): Secondary | ICD-10-CM | POA: Diagnosis not present

## 2021-04-21 DIAGNOSIS — R262 Difficulty in walking, not elsewhere classified: Secondary | ICD-10-CM | POA: Diagnosis not present

## 2021-04-21 DIAGNOSIS — M5416 Radiculopathy, lumbar region: Secondary | ICD-10-CM | POA: Diagnosis not present

## 2021-04-21 DIAGNOSIS — M48062 Spinal stenosis, lumbar region with neurogenic claudication: Secondary | ICD-10-CM | POA: Diagnosis not present

## 2021-04-21 DIAGNOSIS — Z741 Need for assistance with personal care: Secondary | ICD-10-CM | POA: Diagnosis not present

## 2021-04-23 ENCOUNTER — Ambulatory Visit: Payer: Self-pay

## 2021-04-23 NOTE — Telephone Encounter (Signed)
Pt. And caregiver, [place], report pt. Was seen in ED with UTI. Finished antibiotics and abdominal pain has returned. Lower abdomen and radiates into her back. No urinary symptoms. Warm transfer to [place] in the practice for an appointment. Instructed to go to ED for worsening of symptoms.    Reason for Disposition  [1] MODERATE pain (e.g., interferes with normal activities) AND [2] pain comes and goes (cramps) AND [3] present > 24 hours  (Exception: pain with Vomiting or Diarrhea - see that Guideline)  Answer Assessment - Initial Assessment Questions 1. LOCATION: "Where does it hurt?"      Lower abdominal pain 2. RADIATION: "Does the pain shoot anywhere else?" (e.g., chest, back)     Back 3. ONSET: "When did the pain begin?" (e.g., minutes, hours or days ago)      2 weeks ago 4. SUDDEN: "Gradual or sudden onset?"     Sudden 5. PATTERN "Does the pain come and go, or is it constant?"    - If constant: "Is it getting better, staying the same, or worsening?"      (Note: Constant means the pain never goes away completely; most serious pain is constant and it progresses)     - If intermittent: "How long does it last?" "Do you have pain now?"     (Note: Intermittent means the pain goes away completely between bouts)     Constant 6. SEVERITY: "How bad is the pain?"  (e.g., Scale 1-10; mild, moderate, or severe)   - MILD (1-3): doesn't interfere with normal activities, abdomen soft and not tender to touch    - MODERATE (4-7): interferes with normal activities or awakens from sleep, abdomen tender to touch    - SEVERE (8-10): excruciating pain, doubled over, unable to do any normal activities      Severe 7. RECURRENT SYMPTOM: "Have you ever had this type of stomach pain before?" If Yes, ask: "When was the last time?" and "What happened that time?"      Yes 8. CAUSE: "What do you think is causing the stomach pain?"     UTI 9. RELIEVING/AGGRAVATING FACTORS: "What makes it better or worse?"  (e.g., movement, antacids, bowel movement)     Lidocaine patches 10. OTHER SYMPTOMS: "Do you have any other symptoms?" (e.g., back pain, diarrhea, fever, urination pain, vomiting)       Back pain,  11. PREGNANCY: "Is there any chance you are pregnant?" "When was your last menstrual period?"       No  Protocols used: Abdominal Pain - [facility]

## 2021-04-23 NOTE — Telephone Encounter (Signed)
fyi

## 2021-04-24 ENCOUNTER — Other Ambulatory Visit: Payer: Self-pay

## 2021-04-24 ENCOUNTER — Encounter: Payer: Self-pay | Admitting: Physician Assistant

## 2021-04-24 ENCOUNTER — Ambulatory Visit: Payer: Medicare PPO | Admitting: Physician Assistant

## 2021-04-24 VITALS — BP 122/65 | HR 81 | Ht 65.0 in

## 2021-04-24 DIAGNOSIS — K5792 Diverticulitis of intestine, part unspecified, without perforation or abscess without bleeding: Secondary | ICD-10-CM | POA: Diagnosis not present

## 2021-04-24 DIAGNOSIS — N1832 Chronic kidney disease, stage 3b: Secondary | ICD-10-CM

## 2021-04-24 DIAGNOSIS — E1142 Type 2 diabetes mellitus with diabetic polyneuropathy: Secondary | ICD-10-CM | POA: Diagnosis not present

## 2021-04-24 DIAGNOSIS — R310 Gross hematuria: Secondary | ICD-10-CM

## 2021-04-24 DIAGNOSIS — M5137 Other intervertebral disc degeneration, lumbosacral region: Secondary | ICD-10-CM

## 2021-04-24 DIAGNOSIS — G4733 Obstructive sleep apnea (adult) (pediatric): Secondary | ICD-10-CM

## 2021-04-24 LAB — POCT URINALYSIS DIPSTICK
Bilirubin, UA: NEGATIVE
Glucose, UA: NEGATIVE
Ketones, UA: NEGATIVE
Leukocytes, UA: NEGATIVE
Nitrite, UA: NEGATIVE
Protein, UA: NEGATIVE
Spec Grav, UA: 1.015 (ref 1.010–1.025)
Urobilinogen, UA: NEGATIVE E.U./dL — AB
pH, UA: 7 (ref 5.0–8.0)

## 2021-04-24 NOTE — Patient Instructions (Addendum)
Foods to avoid:  Whole grains. -- not white breads/ biscuits Fruits. Vegetables. Beans. Legumes including lentils and nuts.  Okay:  Good low-fiber food options include:  Grains: Lovers of white pasta and white bread, rejoice! Those are good low-fiber options, along with white rice and white crackers. Low-fiber starches: Get your peeler out. Potatoes without skin can be on the menu. Mash, roast or bake them. Certain low-fiber cereals also get a thumbs-up, including corn flakes and puffed rice cereal. Proteins: Choose eggs and egg whites, tofu, and meat or seafood. "It should be tender, so shredded chicken, lean ground beef and soft baked fish work best." Fruits: Use caution since fruits have lots of fiber. Good options include canned fruits such as peaches or pears, applesauce, ripe bananas, and soft, ripe cantaloupe and honeydew. "It's not a lot of fiber because you're not eating the skin. The skins are the source of insoluble fiber, which can irritate inflamed polyps." Dairy: "Cottage cheese and [place] yogurt are real winners if you're recovering from a flare-up: They're high in protein, calcium and other nutrients and don't have any fiber. They're also soft, moist and easier to get down if you're not feeling well," says Ladona Ridgel. You can also have milk and cheese.

## 2021-04-24 NOTE — Assessment & Plan Note (Signed)
May be contributing to her current lower back pain, but that could also be hip arthritis. Regardless, discussed she shouldn't take the original dose of gabapentin due to her low kidney function -- max 1/2 tab twice daily. We will recheck today to see if GFR and CrCl improved post UTI. Will update to whether she can take original dose again.   In general to resolve her low back/left hip pain, stressed physical therapy, stretching, heat/ice will help. Tylenol is fine for additional pain management. No NSAIDS d/t kidney fx

## 2021-04-24 NOTE — Progress Notes (Signed)
Established patient visit   Patient: Renee Carpenter   DOB: 1925/11/03   87 y.o. Female  MRN: [id number] Visit Date: 04/24/2021  Today's healthcare provider: [name], PA-C   Cc. Hospital follow up  Subjective    HPI   Marea is a 87 y/o female who is wheelchair bound who presents today for follow up of hematuria that brought her to the ED 04/12/2021. She states they started her antibiotics, which she finished on Monday. Denies any additional hematuria, dysuria. CT ABD Pelvis was completed w/o urinary tract abnormalities. + culture klebsiella.   Her biggest complaint today is continued left flank pain that radiates to her L groin. Exacerbated w/ movement. She used to take Gabapentin 600 mg BID but cut down to 1/2 tab BID due to decreased kidney function. Lately to control her pain she takes tylenol, and started taking 600 mg BID gabapentin again. She sees a physical therapist twice a week, who is aware of this pain.   She also reports frequent loose bowel movements, which can be difficult to deal with. Denies blood in stool or current abdominal pain.   She also reports increased SOB/wheezing in the morning. She believes it is because she cannot wear her CPAP mask--unable to get it on herself. She has a follow up appointment coming up in regards to her CPAP.  Medications: Outpatient Medications Prior to Visit  Medication Sig   Accu-Chek FastClix Lancets MISC USE TO TEST BLOOD SUGAR UP TO 4 TIMES DAILY AS DIRECTED   ACCU-CHEK GUIDE test strip USE TO TEST BLOOD SUGAR UP TO 4 X DAILY   acetaminophen (TYLENOL) 500 MG tablet Take 500 mg by mouth every 6 (six) hours as needed.   albuterol (VENTOLIN HFA) 108 (90 Base) MCG/ACT inhaler INHALE 2 PUFFS INTO THE LUNGS EVERY 4 HOURS AS NEEDED FOR WHEEZING OR SHORTNESS OF BREATH   ALPHA LIPOIC ACID PO Take by mouth daily.   aspirin EC 81 MG tablet Take 81 mg by mouth daily.    blood glucose meter kit and supplies KIT Dispense based on  patient and insurance preference. Use up to four times daily as directed. (FOR ICD-9 250.00, 250.01).   Blood Glucose Monitoring Suppl (ACCU-CHEK GUIDE) w/Device KIT Check blood sugars daily as directed   Coenzyme Q10 (CO Q10) 200 MG CAPS Take by mouth daily.    DULoxetine (CYMBALTA) 30 MG capsule Take 1 capsule (30 mg total) by mouth daily.   esomeprazole (NEXIUM) 40 MG capsule TAKE 1 CAPSULE BY MOUTH EVERY DAY   gabapentin (NEURONTIN) 600 MG tablet TAKE 1 TABLET(600 MG) BY MOUTH THREE TIMES DAILY (Patient taking differently: Takes 1 tablet in the morning, 1/2 tablet at noon and 1 tablet at night)   glimepiride (AMARYL) 4 MG tablet TAKE 1 TABLET(4 MG) BY MOUTH DAILY BEFORE BREAKFAST   hydroxypropyl methylcellulose (ISOPTO TEARS) 2.5 % ophthalmic solution Place 1 drop into both eyes 3 (three) times daily as needed. Dry eyes    losartan (COZAAR) 25 MG tablet TAKE 1 TABLET BY MOUTH EVERY DAY   MAGNESIUM-OXIDE 400 (241.3 Mg) MG tablet TAKE 1 TABLET BY MOUTH TWICE DAILY   MULTIPLE VITAMIN PO Take by mouth daily.    predniSONE (STERAPRED UNI-PAK 21 TAB) 10 MG (21) TBPK tablet Take as per directed   pregabalin (LYRICA) 25 MG capsule TAKE 1 CAPSULE BY MOUTH TWICE DAILY   rOPINIRole (REQUIP) 0.5 MG tablet Take 0.5 mg by mouth in the morning, at noon, in the  evening, and at bedtime.   ropinirole (REQUIP) 5 MG tablet Take 5 mg by mouth in the morning, at noon, in the evening, and at bedtime.   senna-docusate (SENOKOT-S) 8.6-50 MG tablet Take 1 tablet by mouth daily as needed.    tamsulosin (FLOMAX) 0.4 MG CAPS capsule TAKE 1 CAPSULE(0.4 MG) BY MOUTH DAILY   carbidopa-levodopa (SINEMET IR) 25-100 MG tablet Take by mouth. Take 1 tablet three times a day   No facility-administered medications prior to visit.    Review of Systems  Constitutional:  Negative for fatigue and fever.  Respiratory:  Positive for shortness of breath. Negative for cough.   Cardiovascular:  Negative for chest pain.   Gastrointestinal:  Positive for diarrhea.  Musculoskeletal:  Positive for back pain.  All other systems reviewed and are negative.  Last CBC Lab Results  Component Value Date   WBC 10.2 04/12/2021   HGB 10.6 (L) 04/12/2021   HCT 30.5 (L) 04/12/2021   MCV 118.2 (H) 04/12/2021   MCH 41.1 (H) 04/12/2021   RDW 16.9 (H) 04/12/2021   PLT 220 25/36/6440   Last metabolic panel Lab Results  Component Value Date   GLUCOSE 214 (H) 04/12/2021   NA 138 04/12/2021   K 5.0 04/12/2021   CL 102 04/12/2021   CO2 27 04/12/2021   BUN 24 (H) 04/12/2021   CREATININE 1.37 (H) 04/12/2021   GFRNONAA 36 (L) 04/12/2021   CALCIUM 9.0 04/12/2021   PHOS 3.1 06/13/2019   PROT 7.1 05/19/2020   ALBUMIN 4.0 05/19/2020   LABGLOB 2.6 01/17/2020   AGRATIO 1.6 01/17/2020   BILITOT 0.7 05/19/2020   ALKPHOS 91 05/19/2020   AST 28 05/19/2020   ALT 23 05/19/2020   ANIONGAP 9 04/12/2021   Last lipids Lab Results  Component Value Date   CHOL 149 08/26/2017   HDL 47 08/26/2017   LDLCALC 74 08/26/2017   TRIG 141 08/26/2017   CHOLHDL 3.2 08/26/2017   Last hemoglobin A1c Lab Results  Component Value Date   HGBA1C 8.1 (A) 12/31/2020   Last thyroid functions Lab Results  Component Value Date   TSH 2.680 01/24/2019   T4TOTAL 8.4 07/02/2016   Last vitamin D No results found for: 25OHVITD2, 25OHVITD3, VD25OH Last vitamin B12 and Folate Lab Results  Component Value Date   VITAMINB12 645 06/13/2019       Objective    BP 122/65 (BP Location: Left Arm, Patient Position: Sitting, Cuff Size: Large)   Pulse 81   Ht 5' 5" (1.651 m)   SpO2 98%   BMI 26.63 kg/m  BP Readings from Last 3 Encounters:  04/24/21 122/65  04/12/21 129/80  12/31/20 130/73   Wt Readings from Last 3 Encounters:  04/12/21 160 lb (72.6 kg)  12/31/20 168 lb (76.2 kg)  09/30/20 163 lb (73.9 kg)      Physical Exam Constitutional:      Appearance: Normal appearance. She is not ill-appearing.     Comments: Wheelchair  bound  Cardiovascular:     Rate and Rhythm: Normal rate and regular rhythm.     Pulses: Normal pulses.     Heart sounds: Normal heart sounds.  Pulmonary:     Effort: Pulmonary effort is normal.     Breath sounds: Normal breath sounds.  Neurological:     Mental Status: She is alert and oriented to person, place, and time.  Psychiatric:        Mood and Affect: Mood normal.  Behavior: Behavior normal.     No results found for any visits on 04/24/21.  Assessment & Plan     Hematuria Pt sxs resolved, UA today +RBC but -leuk. Can repeat UA in two weeks when she returns for her other scheduled f/u to see if still + RBC, hesitant to start uro w/u due to age and current quality of life.  Problem List Items Addressed This Visit       Respiratory   OSA (obstructive sleep apnea)    Pt has f/u scheduled for CPAP. Encouraged her to voice concerns about putting face mask on herself and fine one that works.        Musculoskeletal and Integument   DDD (degenerative disc disease), lumbosacral    May be contributing to her current lower back pain, but that could also be hip arthritis. Regardless, discussed she shouldn't take the original dose of gabapentin due to her low kidney function -- max 1/2 tab twice daily. We will recheck today to see if GFR and CrCl improved post UTI. Will update to whether she can take original dose again.   In general to resolve her low back/left hip pain, stressed physical therapy, stretching, heat/ice will help. Tylenol is fine for additional pain management. No NSAIDS d/t kidney fx        Genitourinary   Chronic kidney disease, stage 3, mod decreased GFR (HCC)   Relevant Orders   Comprehensive Metabolic Panel (CMET)     Other   Diverticulitis    Likely the cause of pt's occasional loose BM. Gave information on appropriate diet.  She will start paying attention to what she eats when she has these episodes.      Other Visit Diagnoses     Gross  hematuria    -  Primary   Relevant Orders   POCT Urinalysis Dipstick        Return as scheduled for chronic disease f/u.     I, [name], PA-C have reviewed all documentation for this visit. The documentation on  04/24/2021 for the exam, diagnosis, procedures, and orders are all accurate and complete.   [name], PA-C  [facility] [phone] (phone) [phone] (fax)  [place]

## 2021-04-24 NOTE — Assessment & Plan Note (Signed)
Pt has f/u scheduled for CPAP. Encouraged her to voice concerns about putting face mask on herself and fine one that works.

## 2021-04-24 NOTE — Assessment & Plan Note (Signed)
Likely the cause of pt's occasional loose BM. Gave information on appropriate diet.  She will start paying attention to what she eats when she has these episodes.

## 2021-04-25 LAB — COMPREHENSIVE METABOLIC PANEL
ALT: 6 IU/L (ref 0–32)
AST: 20 IU/L (ref 0–40)
Albumin/Globulin Ratio: 1.5 (ref 1.2–2.2)
Albumin: 4 g/dL (ref 3.5–4.6)
Alkaline Phosphatase: 91 IU/L (ref 44–121)
BUN/Creatinine Ratio: 14 (ref 12–28)
BUN: 19 mg/dL (ref 10–36)
Bilirubin Total: 0.4 mg/dL (ref 0.0–1.2)
CO2: 27 mmol/L (ref 20–29)
Calcium: 9.4 mg/dL (ref 8.7–10.3)
Chloride: 101 mmol/L (ref 96–106)
Creatinine, Ser: 1.39 mg/dL — ABNORMAL HIGH (ref 0.57–1.00)
Globulin, Total: 2.7 g/dL (ref 1.5–4.5)
Glucose: 173 mg/dL — ABNORMAL HIGH (ref 70–99)
Potassium: 5.8 mmol/L — ABNORMAL HIGH (ref 3.5–5.2)
Sodium: 144 mmol/L (ref 134–144)
Total Protein: 6.7 g/dL (ref 6.0–8.5)
eGFR: 35 mL/min/{1.73_m2} — ABNORMAL LOW (ref >59)

## 2021-04-25 LAB — HEMOGLOBIN A1C
Est. average glucose Bld gHb Est-mCnc: 183 mg/dL
Hgb A1c MFr Bld: 8 % — ABNORMAL HIGH (ref 4.8–5.6)

## 2021-05-01 ENCOUNTER — Other Ambulatory Visit: Payer: Self-pay | Admitting: Family Medicine

## 2021-05-01 DIAGNOSIS — K2100 Gastro-esophageal reflux disease with esophagitis, without bleeding: Secondary | ICD-10-CM

## 2021-05-01 DIAGNOSIS — R262 Difficulty in walking, not elsewhere classified: Secondary | ICD-10-CM | POA: Diagnosis not present

## 2021-05-01 DIAGNOSIS — M5416 Radiculopathy, lumbar region: Secondary | ICD-10-CM | POA: Diagnosis not present

## 2021-05-01 DIAGNOSIS — Z741 Need for assistance with personal care: Secondary | ICD-10-CM | POA: Diagnosis not present

## 2021-05-01 DIAGNOSIS — M48062 Spinal stenosis, lumbar region with neurogenic claudication: Secondary | ICD-10-CM | POA: Diagnosis not present

## 2021-05-01 DIAGNOSIS — M6281 Muscle weakness (generalized): Secondary | ICD-10-CM | POA: Diagnosis not present

## 2021-05-05 ENCOUNTER — Ambulatory Visit: Payer: Self-pay | Admitting: Family Medicine

## 2021-05-05 DIAGNOSIS — M6281 Muscle weakness (generalized): Secondary | ICD-10-CM | POA: Diagnosis not present

## 2021-05-05 DIAGNOSIS — M5416 Radiculopathy, lumbar region: Secondary | ICD-10-CM | POA: Diagnosis not present

## 2021-05-05 DIAGNOSIS — M48062 Spinal stenosis, lumbar region with neurogenic claudication: Secondary | ICD-10-CM | POA: Diagnosis not present

## 2021-05-05 DIAGNOSIS — R262 Difficulty in walking, not elsewhere classified: Secondary | ICD-10-CM | POA: Diagnosis not present

## 2021-05-05 DIAGNOSIS — Z741 Need for assistance with personal care: Secondary | ICD-10-CM | POA: Diagnosis not present

## 2021-05-08 ENCOUNTER — Other Ambulatory Visit: Payer: Self-pay | Admitting: Family Medicine

## 2021-05-08 DIAGNOSIS — M48062 Spinal stenosis, lumbar region with neurogenic claudication: Secondary | ICD-10-CM | POA: Diagnosis not present

## 2021-05-08 DIAGNOSIS — Z741 Need for assistance with personal care: Secondary | ICD-10-CM | POA: Diagnosis not present

## 2021-05-08 DIAGNOSIS — G629 Polyneuropathy, unspecified: Secondary | ICD-10-CM

## 2021-05-08 DIAGNOSIS — M6281 Muscle weakness (generalized): Secondary | ICD-10-CM | POA: Diagnosis not present

## 2021-05-08 DIAGNOSIS — M5416 Radiculopathy, lumbar region: Secondary | ICD-10-CM | POA: Diagnosis not present

## 2021-05-08 DIAGNOSIS — R262 Difficulty in walking, not elsewhere classified: Secondary | ICD-10-CM | POA: Diagnosis not present

## 2021-05-08 NOTE — Telephone Encounter (Signed)
Medication Refill - Medication:DULoxetine (CYMBALTA) 20 MG capsule  Has the patient contacted their pharmacy? yes (Agent: If no, request that the patient contact the pharmacy for the refill. If patient does not wish to contact the pharmacy document the reason why and proceed with request.) (Agent: If yes, when and what did the pharmacy advise?)contact pcp, they saaid it was sent in, but I'm showing its not the one she was requesting.  Preferred Pharmacy (with phone number or street name):  [id number] [place], [place] - [address] AT [facility] [address] Phone:  [phone]  Fax:  [phone]     Has the patient been seen for an appointment in the last year OR does the patient have an upcoming appointment? yes  Agent: Please be advised that RX refills may take up to 3 business days. We ask that you follow-up with your pharmacy.

## 2021-05-09 MED ORDER — DULOXETINE HCL 30 MG PO CPEP: 30.0000 mg | ORAL_CAPSULE | Freq: Every day | ORAL | 2 refills | Status: DC

## 2021-05-09 NOTE — Telephone Encounter (Signed)
Requested Prescriptions  Pending Prescriptions Disp Refills  . DULoxetine (CYMBALTA) 30 MG capsule 30 capsule 2    Sig: Take 1 capsule (30 mg total) by mouth daily.     Psychiatry: Antidepressants - SNRI Failed - 05/08/2021  3:45 PM      Failed - Completed PHQ-2 or PHQ-9 in the last 360 days      Passed - Last BP in normal range    BP Readings from Last 1 Encounters:  04/24/21 122/65         Passed - Valid encounter within last 6 months    Recent Outpatient Visits          2 weeks ago Gross hematuria   [facility] Alfredia Ferguson, PA-C   4 months ago Type 2 diabetes mellitus with diabetic polyneuropathy, without long-term current use of insulin [facility])   [facility] Maple Hudson., MD   7 months ago Type 2 diabetes mellitus with diabetic polyneuropathy, without long-term current use of insulin [facility])   [facility] Maple Hudson., MD   1 year ago Type 2 diabetes mellitus with diabetic polyneuropathy, without long-term current use of insulin [facility])   [facility] Maple Hudson., MD   1 year ago Type 2 diabetes mellitus with diabetic polyneuropathy, without long-term current use of insulin [facility])   [facility] Maple Hudson., MD      Future Appointments            In 2 months Ok Edwards, Lillia Abed, PA-C [facility]

## 2021-05-12 DIAGNOSIS — M48062 Spinal stenosis, lumbar region with neurogenic claudication: Secondary | ICD-10-CM | POA: Diagnosis not present

## 2021-05-12 DIAGNOSIS — M5416 Radiculopathy, lumbar region: Secondary | ICD-10-CM | POA: Diagnosis not present

## 2021-05-12 DIAGNOSIS — R262 Difficulty in walking, not elsewhere classified: Secondary | ICD-10-CM | POA: Diagnosis not present

## 2021-05-12 DIAGNOSIS — Z741 Need for assistance with personal care: Secondary | ICD-10-CM | POA: Diagnosis not present

## 2021-05-12 DIAGNOSIS — M6281 Muscle weakness (generalized): Secondary | ICD-10-CM | POA: Diagnosis not present

## 2021-05-15 DIAGNOSIS — M6281 Muscle weakness (generalized): Secondary | ICD-10-CM | POA: Diagnosis not present

## 2021-05-15 DIAGNOSIS — M5416 Radiculopathy, lumbar region: Secondary | ICD-10-CM | POA: Diagnosis not present

## 2021-05-15 DIAGNOSIS — Z741 Need for assistance with personal care: Secondary | ICD-10-CM | POA: Diagnosis not present

## 2021-05-15 DIAGNOSIS — R262 Difficulty in walking, not elsewhere classified: Secondary | ICD-10-CM | POA: Diagnosis not present

## 2021-05-15 DIAGNOSIS — M48062 Spinal stenosis, lumbar region with neurogenic claudication: Secondary | ICD-10-CM | POA: Diagnosis not present

## 2021-05-19 DIAGNOSIS — R262 Difficulty in walking, not elsewhere classified: Secondary | ICD-10-CM | POA: Diagnosis not present

## 2021-05-19 DIAGNOSIS — M48062 Spinal stenosis, lumbar region with neurogenic claudication: Secondary | ICD-10-CM | POA: Diagnosis not present

## 2021-05-19 DIAGNOSIS — Z741 Need for assistance with personal care: Secondary | ICD-10-CM | POA: Diagnosis not present

## 2021-05-19 DIAGNOSIS — M5416 Radiculopathy, lumbar region: Secondary | ICD-10-CM | POA: Diagnosis not present

## 2021-05-19 DIAGNOSIS — M6281 Muscle weakness (generalized): Secondary | ICD-10-CM | POA: Diagnosis not present

## 2021-05-21 ENCOUNTER — Other Ambulatory Visit: Payer: Self-pay | Admitting: Family Medicine

## 2021-05-21 DIAGNOSIS — E1142 Type 2 diabetes mellitus with diabetic polyneuropathy: Secondary | ICD-10-CM

## 2021-05-21 DIAGNOSIS — K2100 Gastro-esophageal reflux disease with esophagitis, without bleeding: Secondary | ICD-10-CM

## 2021-05-25 DIAGNOSIS — G5792 Unspecified mononeuropathy of left lower limb: Secondary | ICD-10-CM | POA: Diagnosis not present

## 2021-05-25 DIAGNOSIS — E1140 Type 2 diabetes mellitus with diabetic neuropathy, unspecified: Secondary | ICD-10-CM | POA: Diagnosis not present

## 2021-05-25 DIAGNOSIS — B351 Tinea unguium: Secondary | ICD-10-CM | POA: Diagnosis not present

## 2021-05-29 DIAGNOSIS — M6281 Muscle weakness (generalized): Secondary | ICD-10-CM | POA: Diagnosis not present

## 2021-05-29 DIAGNOSIS — R262 Difficulty in walking, not elsewhere classified: Secondary | ICD-10-CM | POA: Diagnosis not present

## 2021-05-29 DIAGNOSIS — M5416 Radiculopathy, lumbar region: Secondary | ICD-10-CM | POA: Diagnosis not present

## 2021-05-29 DIAGNOSIS — Z741 Need for assistance with personal care: Secondary | ICD-10-CM | POA: Diagnosis not present

## 2021-05-29 DIAGNOSIS — M48062 Spinal stenosis, lumbar region with neurogenic claudication: Secondary | ICD-10-CM | POA: Diagnosis not present

## 2021-06-02 DIAGNOSIS — M48062 Spinal stenosis, lumbar region with neurogenic claudication: Secondary | ICD-10-CM | POA: Diagnosis not present

## 2021-06-02 DIAGNOSIS — M6281 Muscle weakness (generalized): Secondary | ICD-10-CM | POA: Diagnosis not present

## 2021-06-02 DIAGNOSIS — R262 Difficulty in walking, not elsewhere classified: Secondary | ICD-10-CM | POA: Diagnosis not present

## 2021-06-02 DIAGNOSIS — Z741 Need for assistance with personal care: Secondary | ICD-10-CM | POA: Diagnosis not present

## 2021-06-02 DIAGNOSIS — M5416 Radiculopathy, lumbar region: Secondary | ICD-10-CM | POA: Diagnosis not present

## 2021-06-05 DIAGNOSIS — M6281 Muscle weakness (generalized): Secondary | ICD-10-CM | POA: Diagnosis not present

## 2021-06-05 DIAGNOSIS — M5416 Radiculopathy, lumbar region: Secondary | ICD-10-CM | POA: Diagnosis not present

## 2021-06-05 DIAGNOSIS — R262 Difficulty in walking, not elsewhere classified: Secondary | ICD-10-CM | POA: Diagnosis not present

## 2021-06-05 DIAGNOSIS — M48062 Spinal stenosis, lumbar region with neurogenic claudication: Secondary | ICD-10-CM | POA: Diagnosis not present

## 2021-06-05 DIAGNOSIS — Z741 Need for assistance with personal care: Secondary | ICD-10-CM | POA: Diagnosis not present

## 2021-06-23 DIAGNOSIS — M6281 Muscle weakness (generalized): Secondary | ICD-10-CM | POA: Diagnosis not present

## 2021-06-23 DIAGNOSIS — M5416 Radiculopathy, lumbar region: Secondary | ICD-10-CM | POA: Diagnosis not present

## 2021-06-23 DIAGNOSIS — R262 Difficulty in walking, not elsewhere classified: Secondary | ICD-10-CM | POA: Diagnosis not present

## 2021-06-23 DIAGNOSIS — M48062 Spinal stenosis, lumbar region with neurogenic claudication: Secondary | ICD-10-CM | POA: Diagnosis not present

## 2021-06-23 DIAGNOSIS — Z741 Need for assistance with personal care: Secondary | ICD-10-CM | POA: Diagnosis not present

## 2021-07-02 DIAGNOSIS — M5416 Radiculopathy, lumbar region: Secondary | ICD-10-CM | POA: Diagnosis not present

## 2021-07-02 DIAGNOSIS — Z741 Need for assistance with personal care: Secondary | ICD-10-CM | POA: Diagnosis not present

## 2021-07-02 DIAGNOSIS — M48062 Spinal stenosis, lumbar region with neurogenic claudication: Secondary | ICD-10-CM | POA: Diagnosis not present

## 2021-07-02 DIAGNOSIS — M6281 Muscle weakness (generalized): Secondary | ICD-10-CM | POA: Diagnosis not present

## 2021-07-02 DIAGNOSIS — R262 Difficulty in walking, not elsewhere classified: Secondary | ICD-10-CM | POA: Diagnosis not present

## 2021-07-03 ENCOUNTER — Ambulatory Visit: Payer: Self-pay | Admitting: *Deleted

## 2021-07-03 DIAGNOSIS — M5416 Radiculopathy, lumbar region: Secondary | ICD-10-CM | POA: Diagnosis not present

## 2021-07-03 DIAGNOSIS — M48062 Spinal stenosis, lumbar region with neurogenic claudication: Secondary | ICD-10-CM | POA: Diagnosis not present

## 2021-07-03 DIAGNOSIS — R262 Difficulty in walking, not elsewhere classified: Secondary | ICD-10-CM | POA: Diagnosis not present

## 2021-07-03 DIAGNOSIS — M6281 Muscle weakness (generalized): Secondary | ICD-10-CM | POA: Diagnosis not present

## 2021-07-03 DIAGNOSIS — Z741 Need for assistance with personal care: Secondary | ICD-10-CM | POA: Diagnosis not present

## 2021-07-03 NOTE — Telephone Encounter (Signed)
Josette-caregiver is with patient Chief Complaint: trouble breathing, SOB Symptoms: wheezing, SOB- moderate- using inhaler- not helping much, cough(negative COVID) Frequency: 1-2 weeks Pertinent Negatives: Patient denies fever Disposition: [x] ED /[] Urgent Care (no appt availability in office) / [] Appointment(In office/virtual)/ []  [place] [facility]/ [] Home Care/ [] Refused Recommended Disposition /[] [place] [facility]/ []  Follow-up with PCP Additional Notes: Patient refused ED

## 2021-07-03 NOTE — Telephone Encounter (Signed)
Reason for Disposition  [1] MODERATE difficulty breathing (e.g., speaks in phrases, SOB even at rest, pulse 100-120) AND [2] NEW-onset or WORSE than normal  Answer Assessment - Initial Assessment Questions 1. RESPIRATORY STATUS: "Describe your breathing?" (e.g., wheezing, shortness of breath, unable to speak, severe coughing)      SOB, wheezing. Pain in R side 2. ONSET: "When did this breathing problem begin?"      Noticed SOB-wheezing 1-2 weeks 3. PATTERN "Does the difficult breathing come and go, or has it been constant since it started?"      Comes and goes- mostly in the morning 4. SEVERITY: "How bad is your breathing?" (e.g., mild, moderate, severe)    - MILD: No SOB at rest, mild SOB with walking, speaks normally in sentences, can lie down, no retractions, pulse < 100.    - MODERATE: SOB at rest, SOB with minimal exertion and prefers to sit, cannot lie down flat, speaks in phrases, mild retractions, audible wheezing, pulse 100-120.    - SEVERE: Very SOB at rest, speaks in single words, struggling to breathe, sitting hunched forward, retractions, pulse > 120      moderate 5. RECURRENT SYMPTOM: "Have you had difficulty breathing before?" If Yes, ask: "When was the last time?" and "What happened that time?"      Inhaler this morning- helped a little 6. CARDIAC HISTORY: "Do you have any history of heart disease?" (e.g., heart attack, angina, bypass surgery, angioplasty)      *No Answer* 7. LUNG HISTORY: "Do you have any history of lung disease?"  (e.g., pulmonary embolus, asthma, emphysema)     *No Answer* 8. CAUSE: "What do you think is causing the breathing problem?"      cough 9. OTHER SYMPTOMS: "Do you have any other symptoms? (e.g., dizziness, runny nose, cough, chest pain, fever)     cough 10. O2 SATURATION MONITOR:  "Do you use an oxygen saturation monitor (pulse oximeter) at home?" If Yes, "What is your reading (oxygen level) today?" "What is your usual oxygen saturation reading?"  (e.g., 95%)       *No Answer* 11. PREGNANCY: "Is there any chance you are pregnant?" "When was your last menstrual period?"       *No Answer* 12. TRAVEL: "Have you traveled out of the country in the last month?" (e.g., travel history, exposures)       *No Answer*  Protocols used: Breathing Difficulty-A-AH

## 2021-07-10 DIAGNOSIS — Z741 Need for assistance with personal care: Secondary | ICD-10-CM | POA: Diagnosis not present

## 2021-07-10 DIAGNOSIS — M5416 Radiculopathy, lumbar region: Secondary | ICD-10-CM | POA: Diagnosis not present

## 2021-07-10 DIAGNOSIS — M6281 Muscle weakness (generalized): Secondary | ICD-10-CM | POA: Diagnosis not present

## 2021-07-10 DIAGNOSIS — M48062 Spinal stenosis, lumbar region with neurogenic claudication: Secondary | ICD-10-CM | POA: Diagnosis not present

## 2021-07-10 DIAGNOSIS — R262 Difficulty in walking, not elsewhere classified: Secondary | ICD-10-CM | POA: Diagnosis not present

## 2021-07-13 DIAGNOSIS — Z741 Need for assistance with personal care: Secondary | ICD-10-CM | POA: Diagnosis not present

## 2021-07-13 DIAGNOSIS — M5416 Radiculopathy, lumbar region: Secondary | ICD-10-CM | POA: Diagnosis not present

## 2021-07-13 DIAGNOSIS — M6281 Muscle weakness (generalized): Secondary | ICD-10-CM | POA: Diagnosis not present

## 2021-07-13 DIAGNOSIS — R262 Difficulty in walking, not elsewhere classified: Secondary | ICD-10-CM | POA: Diagnosis not present

## 2021-07-13 DIAGNOSIS — M48062 Spinal stenosis, lumbar region with neurogenic claudication: Secondary | ICD-10-CM | POA: Diagnosis not present

## 2021-07-15 DIAGNOSIS — G2581 Restless legs syndrome: Secondary | ICD-10-CM | POA: Diagnosis not present

## 2021-07-15 DIAGNOSIS — G608 Other hereditary and idiopathic neuropathies: Secondary | ICD-10-CM | POA: Diagnosis not present

## 2021-07-15 DIAGNOSIS — G4752 REM sleep behavior disorder: Secondary | ICD-10-CM | POA: Diagnosis not present

## 2021-07-15 DIAGNOSIS — E1140 Type 2 diabetes mellitus with diabetic neuropathy, unspecified: Secondary | ICD-10-CM | POA: Diagnosis not present

## 2021-07-15 DIAGNOSIS — N1832 Chronic kidney disease, stage 3b: Secondary | ICD-10-CM | POA: Diagnosis not present

## 2021-07-16 DIAGNOSIS — M48062 Spinal stenosis, lumbar region with neurogenic claudication: Secondary | ICD-10-CM | POA: Diagnosis not present

## 2021-07-16 DIAGNOSIS — Z741 Need for assistance with personal care: Secondary | ICD-10-CM | POA: Diagnosis not present

## 2021-07-16 DIAGNOSIS — M5416 Radiculopathy, lumbar region: Secondary | ICD-10-CM | POA: Diagnosis not present

## 2021-07-16 DIAGNOSIS — M6281 Muscle weakness (generalized): Secondary | ICD-10-CM | POA: Diagnosis not present

## 2021-07-16 DIAGNOSIS — R262 Difficulty in walking, not elsewhere classified: Secondary | ICD-10-CM | POA: Diagnosis not present

## 2021-07-17 ENCOUNTER — Encounter: Payer: Self-pay | Admitting: Adult Health

## 2021-07-17 ENCOUNTER — Other Ambulatory Visit: Payer: Self-pay

## 2021-07-17 ENCOUNTER — Ambulatory Visit: Payer: Medicare PPO | Admitting: Adult Health

## 2021-07-17 VITALS — BP 120/62 | HR 97 | Temp 98.3°F | Ht 65.0 in | Wt 171.4 lb

## 2021-07-17 DIAGNOSIS — G4733 Obstructive sleep apnea (adult) (pediatric): Secondary | ICD-10-CM | POA: Diagnosis not present

## 2021-07-17 DIAGNOSIS — R21 Rash and other nonspecific skin eruption: Secondary | ICD-10-CM | POA: Diagnosis not present

## 2021-07-17 DIAGNOSIS — L989 Disorder of the skin and subcutaneous tissue, unspecified: Secondary | ICD-10-CM | POA: Insufficient documentation

## 2021-07-17 NOTE — Progress Notes (Signed)
_0  ID: Renee Carpenter, female    DOB: [birth year], 88 y.o.   MRN: [id number]  Chief Complaint  Patient presents with   Follow-up    Referring provider: Jerrol Banana.,*  HPI: 88 year old female followed for moderate obstructive sleep apnea on BiPAP Wheelchair bound after back surgery 2018-motorized wheelchair .   TEST/EVENTS :   07/17/2021 Follow up : OSA  Patient returns for a 1 year follow-up.  Patient has underlying moderate obstructive sleep apnea.  Patient is on nocturnal BiPAP.  Unfortunately patient has been unable to wear.  She has been unable to get used to her mask.  She has been back to her homecare company multiple times but is unable to figure this out.  Download shows patient is on auto BiPAP IPAP at 19 cm H2O and EPAP minimum at 15 cm H2O. Has daytime sleepiness and does not sleep well. Wants different kind of mask. Really wants to use each night.   Also has had a rash all over for last 3 weeks, has scratched hands but otherwise not pruritic . No new meds. No difficulty swallowing or oral swelling . Unsure what is causing .  Wants referral to dermatology. Has not taken any meds.   Allergies  Allergen Reactions   Acetaminophen Itching and Other (See Comments)    Stomach upset   Aspirin Itching and Other (See Comments)    Stomach pain   Metformin And Related     nausea   Other Other (See Comments)    Novocaine, strange feeling   Sulfa Antibiotics Other (See Comments)   Sulfasalazine     Other reaction(s): Other (See Comments)   Tizanidine     Diarrhea, almost passed out, felt dizzy and confused   Caffeine Palpitations   Ginger Palpitations   Ibuprofen Rash    Immunization History  Administered Date(s) Administered   Fluad Quad(high Dose 65+) 03/13/2019, 04/23/2020, 04/03/2021   Influenza, High Dose Seasonal PF 04/29/2015, 04/20/2016, 03/24/2017, 05/01/2018   PFIZER(Purple Top)SARS-COV-2 Vaccination 06/13/2019, 07/11/2019   Pfizer Covid-19  Vaccine Bivalent Booster 70yr & up 10/17/2020   Pneumococcal Conjugate-13 04/29/2015   Tdap 05/02/2019    Past Medical History:  Diagnosis Date   Anemia    WAS ANEMIC   Arthritis    Blood transfusion    HAD 1 SEVERAL YRS AGO...3-4 YR   Chronic kidney disease    SOME DECREASE IN KIDNEY FUNCTION   Diabetes mellitus without complication (HCC)    Hypertension    Normal cardiac stress test    REQUESTING COPY FROM [facility]   Normal echocardiogram    REQUESTING THAT RESULT -- DR KAHN    PONV (postoperative nausea and vomiting)    Shortness of breath    Spondylolysis 1998    Tobacco History: Social History   Tobacco Use  Smoking Status Never  Smokeless Tobacco Never   Counseling given: Not Answered   Outpatient Medications Prior to Visit  Medication Sig Dispense Refill   Accu-Chek FastClix Lancets MISC USE TO TEST BLOOD SUGAR UP TO 4 TIMES DAILY AS DIRECTED 102 each 1   ACCU-CHEK GUIDE test strip USE TO TEST BLOOD SUGAR UP TO 4 X DAILY 100 strip 1   acetaminophen (TYLENOL) 500 MG tablet Take 500 mg by mouth every 6 (six) hours as needed.     albuterol (VENTOLIN HFA) 108 (90 Base) MCG/ACT inhaler INHALE 2 PUFFS INTO THE LUNGS EVERY 4 HOURS AS NEEDED FOR WHEEZING OR SHORTNESS OF BREATH 6.7 g  3   ALPHA LIPOIC ACID PO Take by mouth daily.     blood glucose meter kit and supplies KIT Dispense based on patient and insurance preference. Use up to four times daily as directed. (FOR ICD-9 250.00, 250.01). 1 each 0   Blood Glucose Monitoring Suppl (ACCU-CHEK GUIDE) w/Device KIT Check blood sugars daily as directed 1 kit 0   Coenzyme Q10 (CO Q10) 200 MG CAPS Take by mouth daily.      DULoxetine (CYMBALTA) 30 MG capsule Take 1 capsule (30 mg total) by mouth daily. 30 capsule 2   esomeprazole (NEXIUM) 40 MG capsule TAKE 1 CAPSULE BY MOUTH EVERY DAY 90 capsule 1   gabapentin (NEURONTIN) 600 MG tablet TAKE 1 TABLET(600 MG) BY MOUTH THREE TIMES DAILY (Patient taking differently: Takes  1 tablet in the morning, 1/2 tablet at noon and 1 tablet at night) 270 tablet 3   gabapentin (NEURONTIN) 600 MG tablet Take by mouth.     glimepiride (AMARYL) 4 MG tablet TAKE 1 TABLET(4 MG) BY MOUTH DAILY BEFORE BREAKFAST 90 tablet 1   hydroxypropyl methylcellulose (ISOPTO TEARS) 2.5 % ophthalmic solution Place 1 drop into both eyes 3 (three) times daily as needed. Dry eyes      losartan (COZAAR) 25 MG tablet TAKE 1 TABLET BY MOUTH EVERY DAY 90 tablet 3   MAGNESIUM-OXIDE 400 (241.3 Mg) MG tablet TAKE 1 TABLET BY MOUTH TWICE DAILY 60 tablet 11   MULTIPLE VITAMIN PO Take by mouth daily.      pregabalin (LYRICA) 25 MG capsule TAKE 1 CAPSULE BY MOUTH TWICE DAILY 180 capsule 1   rOPINIRole (REQUIP) 0.5 MG tablet Take 0.5 mg by mouth in the morning, at noon, in the evening, and at bedtime.     senna-docusate (SENOKOT-S) 8.6-50 MG tablet Take 1 tablet by mouth daily as needed.      tamsulosin (FLOMAX) 0.4 MG CAPS capsule TAKE 1 CAPSULE(0.4 MG) BY MOUTH DAILY 90 capsule 3   predniSONE (STERAPRED UNI-PAK 21 TAB) 10 MG (21) TBPK tablet Take as per directed 21 tablet 0   ropinirole (REQUIP) 5 MG tablet Take 5 mg by mouth in the morning, at noon, in the evening, and at bedtime.     carbidopa-levodopa (SINEMET IR) 25-100 MG tablet Take by mouth. Take 1 tablet three times a day     aspirin EC 81 MG tablet Take 81 mg by mouth daily.  (Patient not taking: Reported on 07/17/2021)     DULoxetine (CYMBALTA) 20 MG capsule Take 20 mg by mouth 2 (two) times daily.     No facility-administered medications prior to visit.     Review of Systems:   Constitutional:   No  weight loss, night sweats,  Fevers, chills, +fatigue, or  lassitude.  HEENT:   No headaches,  Difficulty swallowing,  Tooth/dental problems, or  Sore throat,                No sneezing, itching, ear ache, nasal congestion, post nasal drip,   CV:  No chest pain,  Orthopnea, PND, +swelling in lower extremities, anasarca, dizziness, palpitations,  syncope.   GI  No heartburn, indigestion, abdominal pain, nausea, vomiting, diarrhea, change in bowel habits, loss of appetite, bloody stools.   Resp: No shortness of breath with exertion or at rest.  No excess mucus, no productive cough,  No non-productive cough,  No coughing up of blood.  No change in color of mucus.  No wheezing.  No chest wall deformity  Skin:  no rash or lesions.  GU: no dysuria, change in color of urine, no urgency or frequency.  No flank pain, no hematuria   MS:  No joint pain or swelling.  No decreased range of motion.  No back pain.    Physical Exam  BP 120/62 (BP Location: Left Arm, Cuff Size: Normal)    Pulse 97    Temp 98.3 F (36.8 C) (Temporal)    Ht _0  (1.651 m)    Wt 171 lb 6.4 oz (77.7 kg)    SpO2 91%    BMI 28.52 kg/m   GEN: A/Ox3; pleasant , NAD, well nourished    HEENT:  [place]/AT,  NOSE-clear, THROAT-clear, no lesions, no postnasal drip or exudate noted.   NECK:  Supple w/ fair ROM; no JVD; normal carotid impulses w/o bruits; no thyromegaly or nodules palpated; no lymphadenopathy.    RESP  Clear  P & A; w/o, wheezes/ rales/ or rhonchi. no accessory muscle use, no dullness to percussion  CARD:  RRR, no m/r/g, 1+  peripheral edema, pulses intact, no cyanosis or clubbing.  GI:   Soft & nt; nml bowel sounds; no organomegaly or masses detected.   Musco: Warm bil, Wheelchair bound   Neuro: alert, no focal deficits noted.  Paraparesis   Skin: Warm, + scattered macules/patches along face,hands, torso and legs, diffusely scattered.     Lab Results:    BNP No results found for: BNP  ProBNP No results found for: PROBNP  Imaging: No results found.    No flowsheet data found.  No results found for: NITRICOXIDE      Assessment & Plan:   OSA (obstructive sleep apnea) OSA - having trouble with BIPAP mask .  Will change to dream wear nasal mask  Adjust pressure for comfort   Plan  Patient Instructions  Referral to dermatology  for rash  Try Dream wear nasal small mask .  Adjust BIPAP pressure Auto IPAP Max 18 and EPAP min 8  Can come back 07/21/21 to look at mask around 1:45 pm . Wear BIPAP all night long, and with naps.  Follow up in 3 months and As needed       Rash Dermatitis ? Etiology -refer to Dermatology    I spent 30   minutes dedicated to the care of this patient on the date of this encounter to include pre-visit review of records, face-to-face time with the patient discussing conditions above, post visit ordering of testing, clinical documentation with the electronic health record, making appropriate referrals as documented, and communicating necessary findings to members of the patients care team.    Rexene Edison, NP 07/17/2021

## 2021-07-17 NOTE — Assessment & Plan Note (Signed)
Dermatitis ? Etiology -refer to Dermatology

## 2021-07-17 NOTE — Assessment & Plan Note (Signed)
OSA - having trouble with BIPAP mask .  Will change to dream wear nasal mask  Adjust pressure for comfort   Plan  Patient Instructions  Referral to dermatology for rash  Try Dream wear nasal small mask .  Adjust BIPAP pressure Auto IPAP Max 18 and EPAP min 8  Can come back 07/21/21 to look at mask around 1:45 pm . Wear BIPAP all night long, and with naps.  Follow up in 3 months and As needed

## 2021-07-17 NOTE — Patient Instructions (Addendum)
Referral to dermatology for rash  Try Dream wear nasal small mask .  Adjust BIPAP pressure Auto IPAP Max 18 and EPAP min 8  Can come back 07/21/21 to look at mask around 1:45 pm . Wear BIPAP all night long, and with naps.  Follow up in 3 months and As needed

## 2021-07-20 ENCOUNTER — Encounter: Payer: Self-pay | Admitting: Emergency Medicine

## 2021-07-20 ENCOUNTER — Inpatient Hospital Stay
Admission: EM | Admit: 2021-07-20 | Discharge: 2021-07-24 | DRG: 291 | Disposition: A | Discharge status: Home / Self Care | Payer: Medicare PPO | Attending: Hospitalist | Admitting: Hospitalist

## 2021-07-20 ENCOUNTER — Other Ambulatory Visit: Payer: Self-pay

## 2021-07-20 ENCOUNTER — Emergency Department: Payer: Medicare PPO

## 2021-07-20 DIAGNOSIS — I10 Essential (primary) hypertension: Secondary | ICD-10-CM | POA: Diagnosis present

## 2021-07-20 DIAGNOSIS — J90 Pleural effusion, not elsewhere classified: Secondary | ICD-10-CM

## 2021-07-20 DIAGNOSIS — R0902 Hypoxemia: Secondary | ICD-10-CM | POA: Diagnosis not present

## 2021-07-20 DIAGNOSIS — I509 Heart failure, unspecified: Secondary | ICD-10-CM | POA: Diagnosis not present

## 2021-07-20 DIAGNOSIS — R0602 Shortness of breath: Secondary | ICD-10-CM | POA: Diagnosis not present

## 2021-07-20 DIAGNOSIS — Z90710 Acquired absence of both cervix and uterus: Secondary | ICD-10-CM

## 2021-07-20 DIAGNOSIS — G4733 Obstructive sleep apnea (adult) (pediatric): Secondary | ICD-10-CM | POA: Diagnosis present

## 2021-07-20 DIAGNOSIS — Z20822 Contact with and (suspected) exposure to covid-19: Secondary | ICD-10-CM | POA: Diagnosis present

## 2021-07-20 DIAGNOSIS — Z8249 Family history of ischemic heart disease and other diseases of the circulatory system: Secondary | ICD-10-CM | POA: Diagnosis not present

## 2021-07-20 DIAGNOSIS — E1140 Type 2 diabetes mellitus with diabetic neuropathy, unspecified: Secondary | ICD-10-CM | POA: Diagnosis present

## 2021-07-20 DIAGNOSIS — M1990 Unspecified osteoarthritis, unspecified site: Secondary | ICD-10-CM | POA: Diagnosis present

## 2021-07-20 DIAGNOSIS — R609 Edema, unspecified: Secondary | ICD-10-CM | POA: Diagnosis not present

## 2021-07-20 DIAGNOSIS — I517 Cardiomegaly: Secondary | ICD-10-CM | POA: Diagnosis not present

## 2021-07-20 DIAGNOSIS — F411 Generalized anxiety disorder: Secondary | ICD-10-CM | POA: Diagnosis present

## 2021-07-20 DIAGNOSIS — Z833 Family history of diabetes mellitus: Secondary | ICD-10-CM | POA: Diagnosis not present

## 2021-07-20 DIAGNOSIS — E118 Type 2 diabetes mellitus with unspecified complications: Secondary | ICD-10-CM

## 2021-07-20 DIAGNOSIS — G20 Parkinson's disease: Secondary | ICD-10-CM | POA: Diagnosis present

## 2021-07-20 DIAGNOSIS — J811 Chronic pulmonary edema: Secondary | ICD-10-CM | POA: Diagnosis not present

## 2021-07-20 DIAGNOSIS — J449 Chronic obstructive pulmonary disease, unspecified: Secondary | ICD-10-CM | POA: Diagnosis present

## 2021-07-20 DIAGNOSIS — E1122 Type 2 diabetes mellitus with diabetic chronic kidney disease: Secondary | ICD-10-CM | POA: Diagnosis not present

## 2021-07-20 DIAGNOSIS — G629 Polyneuropathy, unspecified: Secondary | ICD-10-CM

## 2021-07-20 DIAGNOSIS — Z79899 Other long term (current) drug therapy: Secondary | ICD-10-CM

## 2021-07-20 DIAGNOSIS — Z981 Arthrodesis status: Secondary | ICD-10-CM

## 2021-07-20 DIAGNOSIS — Z7984 Long term (current) use of oral hypoglycemic drugs: Secondary | ICD-10-CM | POA: Diagnosis not present

## 2021-07-20 DIAGNOSIS — I130 Hypertensive heart and chronic kidney disease with heart failure and stage 1 through stage 4 chronic kidney disease, or unspecified chronic kidney disease: Secondary | ICD-10-CM | POA: Diagnosis not present

## 2021-07-20 DIAGNOSIS — N183 Chronic kidney disease, stage 3 unspecified: Secondary | ICD-10-CM | POA: Diagnosis present

## 2021-07-20 DIAGNOSIS — G2581 Restless legs syndrome: Secondary | ICD-10-CM | POA: Diagnosis present

## 2021-07-20 DIAGNOSIS — E1165 Type 2 diabetes mellitus with hyperglycemia: Secondary | ICD-10-CM | POA: Diagnosis present

## 2021-07-20 DIAGNOSIS — I5033 Acute on chronic diastolic (congestive) heart failure: Secondary | ICD-10-CM | POA: Diagnosis not present

## 2021-07-20 DIAGNOSIS — N1832 Chronic kidney disease, stage 3b: Secondary | ICD-10-CM | POA: Diagnosis not present

## 2021-07-20 DIAGNOSIS — I5022 Chronic systolic (congestive) heart failure: Secondary | ICD-10-CM | POA: Diagnosis not present

## 2021-07-20 DIAGNOSIS — I5043 Acute on chronic combined systolic (congestive) and diastolic (congestive) heart failure: Secondary | ICD-10-CM | POA: Diagnosis not present

## 2021-07-20 DIAGNOSIS — J9601 Acute respiratory failure with hypoxia: Secondary | ICD-10-CM | POA: Diagnosis not present

## 2021-07-20 DIAGNOSIS — I110 Hypertensive heart disease with heart failure: Secondary | ICD-10-CM | POA: Diagnosis not present

## 2021-07-20 DIAGNOSIS — J9811 Atelectasis: Secondary | ICD-10-CM | POA: Diagnosis not present

## 2021-07-20 DIAGNOSIS — I5023 Acute on chronic systolic (congestive) heart failure: Secondary | ICD-10-CM | POA: Diagnosis not present

## 2021-07-20 LAB — URINALYSIS, ROUTINE W REFLEX MICROSCOPIC
Bacteria, UA: NONE SEEN
Bilirubin Urine: NEGATIVE
Glucose, UA: 50 mg/dL — AB
Ketones, ur: NEGATIVE mg/dL
Nitrite: NEGATIVE
Protein, ur: 100 mg/dL — AB
Specific Gravity, Urine: 1.009 (ref 1.005–1.030)
pH: 7 (ref 5.0–8.0)

## 2021-07-20 LAB — CBC WITH DIFFERENTIAL/PLATELET
Abs Immature Granulocytes: 0.04 10*3/uL (ref 0.00–0.07)
Basophils Absolute: 0.1 10*3/uL (ref 0.0–0.1)
Basophils Relative: 1 %
Eosinophils Absolute: 0.1 10*3/uL (ref 0.0–0.5)
Eosinophils Relative: 1 %
HCT: 38.8 % (ref 36.0–46.0)
Hemoglobin: 12.9 g/dL (ref 12.0–15.0)
Immature Granulocytes: 1 %
Lymphocytes Relative: 24 %
Lymphs Abs: 1.4 10*3/uL (ref 0.7–4.0)
MCH: 40.6 pg — ABNORMAL HIGH (ref 26.0–34.0)
MCHC: 33.2 g/dL (ref 30.0–36.0)
MCV: 122 fL — ABNORMAL HIGH (ref 80.0–100.0)
Monocytes Absolute: 0.6 10*3/uL (ref 0.1–1.0)
Monocytes Relative: 11 %
Neutro Abs: 3.6 10*3/uL (ref 1.7–7.7)
Neutrophils Relative %: 62 %
Platelets: 207 10*3/uL (ref 150–400)
RBC: 3.18 MIL/uL — ABNORMAL LOW (ref 3.87–5.11)
RDW: 17.3 % — ABNORMAL HIGH (ref 11.5–15.5)
Smear Review: NORMAL
WBC: 5.8 10*3/uL (ref 4.0–10.5)
nRBC: 0 % (ref 0.0–0.2)

## 2021-07-20 LAB — COMPREHENSIVE METABOLIC PANEL
ALT: 8 U/L (ref 0–44)
AST: 31 U/L (ref 15–41)
Albumin: 3.5 g/dL (ref 3.5–5.0)
Alkaline Phosphatase: 77 U/L (ref 38–126)
Anion gap: 9 (ref 5–15)
BUN: 19 mg/dL (ref 8–23)
CO2: 26 mmol/L (ref 22–32)
Calcium: 8.7 mg/dL — ABNORMAL LOW (ref 8.9–10.3)
Chloride: 102 mmol/L (ref 98–111)
Creatinine, Ser: 1.38 mg/dL — ABNORMAL HIGH (ref 0.44–1.00)
GFR, Estimated: 36 mL/min — ABNORMAL LOW (ref >60)
Glucose, Bld: 296 mg/dL — ABNORMAL HIGH (ref 70–99)
Potassium: 4.5 mmol/L (ref 3.5–5.1)
Sodium: 137 mmol/L (ref 135–145)
Total Bilirubin: 1.6 mg/dL — ABNORMAL HIGH (ref 0.3–1.2)
Total Protein: 6.7 g/dL (ref 6.5–8.1)

## 2021-07-20 LAB — TROPONIN I (HIGH SENSITIVITY)
Troponin I (High Sensitivity): 27 ng/L — ABNORMAL HIGH (ref <18)
Troponin I (High Sensitivity): 28 ng/L — ABNORMAL HIGH (ref <18)

## 2021-07-20 LAB — RESP PANEL BY RT-PCR (FLU A&B, COVID) ARPGX2
Influenza A by PCR: NEGATIVE
Influenza B by PCR: NEGATIVE
SARS Coronavirus 2 by RT PCR: NEGATIVE

## 2021-07-20 LAB — BRAIN NATRIURETIC PEPTIDE: B Natriuretic Peptide: 1845.6 pg/mL — ABNORMAL HIGH (ref 0.0–100.0)

## 2021-07-20 LAB — GLUCOSE, CAPILLARY
Glucose-Capillary: 188 mg/dL — ABNORMAL HIGH (ref 70–99)
Glucose-Capillary: 177 mg/dL — ABNORMAL HIGH (ref 70–99)

## 2021-07-20 MED ORDER — INSULIN ASPART 100 UNIT/ML IJ SOLN
0.0000 [IU] | Freq: Three times a day (TID) | INTRAMUSCULAR | Status: DC
  Administered 2021-07-21: 1 [IU] via SUBCUTANEOUS
  Administered 2021-07-21 – 2021-07-22 (×3): 3 [IU] via SUBCUTANEOUS
  Administered 2021-07-22: 1 [IU] via SUBCUTANEOUS
  Administered 2021-07-22 – 2021-07-23 (×2): 2 [IU] via SUBCUTANEOUS
  Administered 2021-07-23: 1 [IU] via SUBCUTANEOUS
  Administered 2021-07-23: 3 [IU] via SUBCUTANEOUS
  Administered 2021-07-24: 2 [IU] via SUBCUTANEOUS
  Filled 2021-07-20 (×11): qty 1

## 2021-07-20 MED ORDER — ONDANSETRON HCL 4 MG/2ML IJ SOLN: 4.0000 mg | Freq: Four times a day (QID) | INTRAMUSCULAR | Status: DC | PRN

## 2021-07-20 MED ORDER — DULOXETINE HCL 30 MG PO CPEP
30.0000 mg | ORAL_CAPSULE | Freq: Every day | ORAL | Status: DC
  Administered 2021-07-21 – 2021-07-24 (×4): 30 mg via ORAL
  Filled 2021-07-20 (×4): qty 1

## 2021-07-20 MED ORDER — SODIUM CHLORIDE 0.9% FLUSH
3.0000 mL | Freq: Two times a day (BID) | INTRAVENOUS | Status: DC
  Administered 2021-07-20 – 2021-07-23 (×8): 3 mL via INTRAVENOUS

## 2021-07-20 MED ORDER — CARBIDOPA-LEVODOPA 25-100 MG PO TABS
1.0000 | ORAL_TABLET | Freq: Three times a day (TID) | ORAL | Status: DC
  Administered 2021-07-20 – 2021-07-23 (×10): 1 via ORAL
  Filled 2021-07-20 (×10): qty 1

## 2021-07-20 MED ORDER — ALBUTEROL SULFATE (2.5 MG/3ML) 0.083% IN NEBU
3.0000 mL | INHALATION_SOLUTION | RESPIRATORY_TRACT | Status: DC | PRN
  Administered 2021-07-21 (×2): 3 mL via RESPIRATORY_TRACT
  Filled 2021-07-20 (×2): qty 3

## 2021-07-20 MED ORDER — GABAPENTIN 600 MG PO TABS
600.0000 mg | ORAL_TABLET | Freq: Three times a day (TID) | ORAL | Status: DC
  Administered 2021-07-20 (×2): 600 mg via ORAL
  Filled 2021-07-20 (×2): qty 1

## 2021-07-20 MED ORDER — FUROSEMIDE 10 MG/ML IJ SOLN
40.0000 mg | Freq: Once | INTRAMUSCULAR | Status: AC
  Administered 2021-07-20: 40 mg via INTRAVENOUS
  Filled 2021-07-20: qty 4

## 2021-07-20 MED ORDER — SODIUM CHLORIDE 0.9 % IV SOLN: 250.0000 mL | INTRAVENOUS | Status: DC | PRN

## 2021-07-20 MED ORDER — FUROSEMIDE 10 MG/ML IJ SOLN
40.0000 mg | Freq: Two times a day (BID) | INTRAMUSCULAR | Status: DC
  Administered 2021-07-21 (×2): 40 mg via INTRAVENOUS
  Filled 2021-07-20 (×3): qty 4

## 2021-07-20 MED ORDER — FUROSEMIDE 10 MG/ML IJ SOLN: 40.0000 mg | Freq: Every day | INTRAMUSCULAR | Status: DC

## 2021-07-20 MED ORDER — ACETAMINOPHEN 325 MG PO TABS: 650.0000 mg | ORAL_TABLET | ORAL | Status: DC | PRN

## 2021-07-20 MED ORDER — LOSARTAN POTASSIUM 50 MG PO TABS: 25.0000 mg | ORAL_TABLET | Freq: Every day | ORAL | Status: DC

## 2021-07-20 MED ORDER — SODIUM CHLORIDE 0.9% FLUSH: 3.0000 mL | INTRAVENOUS | Status: DC | PRN

## 2021-07-20 MED ORDER — ENOXAPARIN SODIUM 30 MG/0.3ML IJ SOSY
30.0000 mg | PREFILLED_SYRINGE | INTRAMUSCULAR | Status: DC
  Administered 2021-07-20 – 2021-07-23 (×4): 30 mg via SUBCUTANEOUS
  Filled 2021-07-20 (×4): qty 0.3

## 2021-07-20 MED ORDER — ROPINIROLE HCL 1 MG PO TABS
0.5000 mg | ORAL_TABLET | Freq: Three times a day (TID) | ORAL | Status: DC
  Administered 2021-07-20 – 2021-07-23 (×10): 0.5 mg via ORAL
  Filled 2021-07-20 (×10): qty 1

## 2021-07-20 NOTE — H&P (Addendum)
History and Physical    Yuma Blucher Hedstrom XTK:[id number] DOB: 1925/09/23 DOA: 07/20/2021  PCP: Jerrol Banana., MD   Patient coming from: [place] have personally briefly reviewed patient's old medical records in [place]  Chief Complaint: Generalized weakness, bilateral leg swelling and difficulty breathing  HPI: Renee Carpenter is a 88 y.o. female with medical history significant of diabetes mellitus type 2 with neuropathy, essential hypertension, anxiety disorder, arthritis, COPD, CKD stage IIIb presented in the ED with complaints of generalized weakness bilateral leg swelling and difficulty breathing.  Patient reports the symptoms has been happening for last 3 to 4 weeks, she started noticing bilateral leg swelling which has been getting worse by end of day, later she has developed feeling weak and fatigued and now for last few days she has developed difficulty breathing, she gets tired easily, denies any chest pain, palpitation, dizziness.  Patient also reports gaining weight but has not checked her weight.  Patient reports been sleeping in a recliner for last few days.  She denies any nausea, vomiting, sick contacts, recent travel.  ED Course: She was tachypneic, hypertensive and hypoxic on arrival. Temp 97.8, HR 86, RR 28, BP 140/76, SPO2 85% on room air which improved to 92% on 4 L. Labs include sodium 137, potassium 4.5, chloride 102, bicarb 26, glucose 296, BUN 19, creatinine 1.38, calcium 8.7, anion gap 9, alkaline phosphatase 77, albumin 3.5, AST 31, ALT 8, total protein 6.7, total bilirubin 1.6, BNP 1845.6, troponin 27, WBC 5.8, hemoglobin 12.9, hematocrit 38.8, MCV 122, platelet 207, influenza negative, COVID-negative, Chest x-ray finding consistent with CHF.  Left pleural effusion  Review of Systems:  Review of Systems  Constitutional:  Positive for malaise/fatigue.  HENT: Negative.    Eyes: Negative.   Respiratory:  Positive for  shortness of breath.   Cardiovascular:  Positive for orthopnea, leg swelling and PND.  Gastrointestinal: Negative.   Genitourinary: Negative.   Musculoskeletal: Negative.   Skin: Negative.   Neurological:  Positive for weakness.  Endo/Heme/Allergies: Negative.   Psychiatric/Behavioral:  Positive for depression. The patient is nervous/anxious and has insomnia.    Past Medical History:  Diagnosis Date   Anemia    WAS ANEMIC   Arthritis    Blood transfusion    HAD 1 SEVERAL YRS AGO...3-4 YR   Chronic kidney disease    SOME DECREASE IN KIDNEY FUNCTION   Diabetes mellitus without complication (HCC)    Hypertension    Normal cardiac stress test    REQUESTING COPY FROM [facility]   Normal echocardiogram    REQUESTING THAT RESULT -- DR KAHN    PONV (postoperative nausea and vomiting)    Shortness of breath    Spondylolysis 1998    Past Surgical History:  Procedure Laterality Date   ABDOMINAL HYSTERECTOMY     BACK SURGERY     2 CERVICAL SURGERIES ALSO   CATARACTS     RIGHT EYE CATARACT   CERVICAL DISCECTOMY  08/1995 and 2011   C4-,C5-6, C6-7 anterior cervical; Dr. Ellene Route fusion and plating   LAMINECTOMY     decompressive; Dr. Burman Riis in [place] MICRODISCECTOMY  04/14/2011   Procedure: LUMBAR LAMINECTOMY/DECOMPRESSION MICRODISCECTOMY;  Surgeon: [name];  Location: [place];  Service: Neurosurgery;  Laterality: N/A;  Lumbar Three and Lumbar Four Laminectomy   TONSILLECTOMY AND ADENOIDECTOMY  1939     reports that she has never smoked. She has never used smokeless tobacco.  She reports that she does not drink alcohol and does not use drugs.  Allergies  Allergen Reactions   Acetaminophen Itching and Other (See Comments)    Stomach upset   Aspirin Itching and Other (See Comments)    Stomach pain   Metformin And Related     nausea   Other Other (See Comments)    Novocaine, strange feeling   Sulfa Antibiotics Other (See  Comments)   Sulfasalazine     Other reaction(s): Other (See Comments)   Tizanidine     Diarrhea, almost passed out, felt dizzy and confused   Caffeine Palpitations   Ginger Palpitations   Ibuprofen Rash    Family History  Problem Relation Age of Onset   Heart failure Father    Heart disease Father    Hypertension Mother    Diabetes Mother    Heart disease Mother    Cancer Sister        breast   Breast cancer Sister 24   Cancer Maternal Grandfather    Cancer Maternal Aunt        x3   Breast cancer Daughter 56   Family history reviewed and not pertinent.  Prior to Admission medications   Medication Sig Start Date End Date Taking? Authorizing Provider  Accu-Chek FastClix Lancets MISC USE TO TEST BLOOD SUGAR UP TO 4 TIMES DAILY AS DIRECTED 01/28/20   Jerrol Banana., MD  ACCU-CHEK GUIDE test strip USE TO TEST BLOOD SUGAR UP TO 4 X DAILY 01/28/20   Jerrol Banana., MD  acetaminophen (TYLENOL) 500 MG tablet Take 500 mg by mouth every 6 (six) hours as needed. 07/16/16   [provider]  albuterol (VENTOLIN HFA) 108 (90 Base) MCG/ACT inhaler INHALE 2 PUFFS INTO THE LUNGS EVERY 4 HOURS AS NEEDED FOR WHEEZING OR SHORTNESS OF BREATH 02/19/21   Jerrol Banana., MD  ALPHA LIPOIC ACID PO Take by mouth daily.    [provider]  blood glucose meter kit and supplies KIT Dispense based on patient and insurance preference. Use up to four times daily as directed. (FOR ICD-9 250.00, 250.01). 07/25/18   Jerrol Banana., MD  Blood Glucose Monitoring Suppl (ACCU-CHEK GUIDE) w/Device KIT Check blood sugars daily as directed 08/04/20   Jerrol Banana., MD  carbidopa-levodopa (SINEMET IR) 25-100 MG tablet Take by mouth. Take 1 tablet three times a day 05/21/20 08/19/20  [provider]  Coenzyme Q10 (CO Q10) 200 MG CAPS Take by mouth daily.  08/09/11   [provider]  DULoxetine (CYMBALTA) 30 MG capsule Take 1 capsule (30 mg total) by mouth  daily. 05/09/21   Jerrol Banana., MD  esomeprazole (NEXIUM) 40 MG capsule TAKE 1 CAPSULE BY MOUTH EVERY DAY 05/21/21   Birdie Sons, MD  gabapentin (NEURONTIN) 600 MG tablet TAKE 1 TABLET(600 MG) BY MOUTH THREE TIMES DAILY Patient taking differently: Takes 1 tablet in the morning, 1/2 tablet at noon and 1 tablet at night 02/06/21   Jerrol Banana., MD  gabapentin (NEURONTIN) 600 MG tablet Take by mouth. 07/15/21 07/10/22  [provider]  glimepiride (AMARYL) 4 MG tablet TAKE 1 TABLET(4 MG) BY MOUTH DAILY BEFORE BREAKFAST 05/21/21   Birdie Sons, MD  hydroxypropyl methylcellulose (ISOPTO TEARS) 2.5 % ophthalmic solution Place 1 drop into both eyes 3 (three) times daily as needed. Dry eyes     [provider]  losartan (COZAAR) 25 MG tablet TAKE 1 TABLET  BY MOUTH EVERY DAY 08/28/18   Jerrol Banana., MD  MAGNESIUM-OXIDE 400 (241.3 Mg) MG tablet TAKE 1 TABLET BY MOUTH TWICE DAILY 03/17/17   Jerrol Banana., MD  MULTIPLE VITAMIN PO Take by mouth daily.  08/09/11   [provider]  pregabalin (LYRICA) 25 MG capsule TAKE 1 CAPSULE BY MOUTH TWICE DAILY 12/09/20   Jerrol Banana., MD  rOPINIRole (REQUIP) 0.5 MG tablet Take 0.5 mg by mouth in the morning, at noon, in the evening, and at bedtime. 10/10/12   [provider]  senna-docusate (SENOKOT-S) 8.6-50 MG tablet Take 1 tablet by mouth daily as needed.  07/16/16   [provider]  tamsulosin (FLOMAX) 0.4 MG CAPS capsule TAKE 1 CAPSULE(0.4 MG) BY MOUTH DAILY 09/07/20   Jerrol Banana., MD    Physical Exam: Vitals:   07/20/21 1145 07/20/21 1201 07/20/21 1230 07/20/21 1330  BP: 140/76  (!) 162/88 134/83  Pulse: 97  94 86  Resp: 20  (!) 28 (!) 29  Temp: 97.8 F (36.6 C)     TempSrc: Oral     SpO2: (!) 85% 94% 91% 92%  Weight: 77.1 kg     Height: $Remove'5\' 5"'hUzSIGg$  (1.651 m)       Constitutional: Appears comfortable, not in any acute distress,  remains on 4 L of supplemental oxygen  sats 94%. Vitals:   07/20/21 1145 07/20/21 1201 07/20/21 1230 07/20/21 1330  BP: 140/76  (!) 162/88 134/83  Pulse: 97  94 86  Resp: 20  (!) 28 (!) 29  Temp: 97.8 F (36.6 C)     TempSrc: Oral     SpO2: (!) 85% 94% 91% 92%  Weight: 77.1 kg     Height: $Remove'5\' 5"'gQFGzOW$  (1.651 m)      Eyes: PERRL, lids and conjunctivae normal ENMT: Mucous membranes are moist. Posterior pharynx without exudate.  Normal dentition.  Neck: normal, supple, no masses, no thyromegaly Respiratory: Bilateral basilar crackles+, no wheezing, normal respiratory effort, no accessory muscle use. Cardiovascular: S1-S2 heard, regular rate and rhythm, no murmur. Abdomen: Abdomen is soft, non tender, non distended, BS+ Musculoskeletal: +Edema , no cyanosis, no clubbing, good ROM, no contractures. Normal muscle tone.  Skin: no rashes, lesions, ulcers. No induration Neurologic: CN 2-12 grossly intact. Sensation intact, DTR normal. Strength 5/5 in all 4.  Psychiatric: Normal judgment and insight. Alert and oriented x 3. Normal mood.    Labs on Admission: I have personally reviewed following labs and imaging studies  CBC: Recent Labs  Lab 07/20/21 1158  WBC 5.8  NEUTROABS 3.6  HGB 12.9  HCT 38.8  MCV 122.0*  PLT 841   Basic Metabolic Panel: Recent Labs  Lab 07/20/21 1158  NA 137  K 4.5  CL 102  CO2 26  GLUCOSE 296*  BUN 19  CREATININE 1.38*  CALCIUM 8.7*   GFR: Estimated Creatinine Clearance: 26.1 mL/min (A) (by C-G formula based on SCr of 1.38 mg/dL (H)). Liver Function Tests: Recent Labs  Lab 07/20/21 1158  AST 31  ALT 8  ALKPHOS 77  BILITOT 1.6*  PROT 6.7  ALBUMIN 3.5   No results for input(s): LIPASE, AMYLASE in the last 168 hours. No results for input(s): AMMONIA in the last 168 hours. Coagulation Profile: No results for input(s): INR, PROTIME in the last 168 hours. Cardiac Enzymes: No results for input(s): CKTOTAL, CKMB, CKMBINDEX, TROPONINI in the last 168 hours. BNP (last 3 results) No  results for input(s): PROBNP in  the last 8760 hours. HbA1C: No results for input(s): HGBA1C in the last 72 hours. CBG: No results for input(s): GLUCAP in the last 168 hours. Lipid Profile: No results for input(s): CHOL, HDL, LDLCALC, TRIG, CHOLHDL, LDLDIRECT in the last 72 hours. Thyroid Function Tests: No results for input(s): TSH, T4TOTAL, FREET4, T3FREE, THYROIDAB in the last 72 hours. Anemia Panel: No results for input(s): VITAMINB12, FOLATE, FERRITIN, TIBC, IRON, RETICCTPCT in the last 72 hours. Urine analysis:    Component Value Date/Time   COLORURINE YELLOW (A) 04/12/2021 1700   APPEARANCEUR CLOUDY (A) 04/12/2021 1700   APPEARANCEUR Hazy 02/06/2014 1958   LABSPEC 1.010 04/12/2021 1700   LABSPEC 1.020 02/06/2014 1958   PHURINE 7.0 04/12/2021 1700   GLUCOSEU NEGATIVE 04/12/2021 1700   GLUCOSEU Negative 02/06/2014 1958   HGBUR MODERATE (A) 04/12/2021 1700   BILIRUBINUR negative 04/24/2021 1359   BILIRUBINUR Negative 02/06/2014 1958   KETONESUR 5 (A) 04/12/2021 1700   PROTEINUR Negative 04/24/2021 1359   PROTEINUR 30 (A) 04/12/2021 1700   UROBILINOGEN negative (A) 04/24/2021 1359   NITRITE negative 04/24/2021 1359   NITRITE NEGATIVE 04/12/2021 1700   LEUKOCYTESUR Negative 04/24/2021 1359   LEUKOCYTESUR LARGE (A) 04/12/2021 1700   LEUKOCYTESUR 2+ 02/06/2014 1958    Radiological Exams on Admission: DG Chest Portable 1 View  Result Date: 07/20/2021 CLINICAL DATA:  Shortness of breath EXAM: PORTABLE CHEST 1 VIEW COMPARISON:  Portable exam 1201 hours compared to 09/10/2020 Correlation: Cervical spine radiographs 01/24/2019 FINDINGS: Enlargement of cardiac silhouette with pulmonary vascular congestion. Atherosclerotic calcification aorta. BILATERAL perihilar to basilar infiltrates favor pulmonary edema and CHF. Small LEFT pleural effusion and LEFT basilar atelectasis. No pneumothorax. Prior cervical spine fusion with chronic fracture of the cervical plate. IMPRESSION: Enlargement  of cardiac silhouette with pulmonary vascular congestion and diffuse pulmonary infiltrates favoring CHF. LEFT pleural effusion and basilar atelectasis. Aortic Atherosclerosis (ICD10-I70.0). Electronically Signed   By: Lavonia Dana M.D.   On: 07/20/2021 12:17    EKG: Independently reviewed.  Normal sinus rhythm  Assessment/Plan Principal Problem:   CHF exacerbation (HCC) Active Problems:   Neuropathy (HCC)   Essential hypertension   COPD, mild (HCC)   Anxiety, generalized   Arthritis, degenerative   Chronic kidney disease, stage 3, mod decreased GFR (HCC)   Acute hypoxic respiratory failure secondary to CHF exacerbation: Patient presented with fatigue, bilateral leg swelling, progressive shortness of breath, elevated BNP,  x-ray showing pulmonary edema. All findings consistent with CHF exacerbation. Admit in telemetry, Continue daily weight, intake output charting Continue IV Lasix 40 mg daily. Continue losartan 25 mg daily. Hold on beta-blocker since patient is having acute exacerbation. Obtain 2D echocardiogram.  Continue supplemental oxygen and wean as tolerated. Cardiology consulted.  CHF exacerbation: Continue Lasix, losartan. Obtain 2D echocardiogram.  Diabetes mellitus: Hold p.o. diabetic medications. Start regular insulin sliding scale Obtain hemoglobin A1c  Essential hypertension: Continue losartan.  CKD stage IIIb: Renal functions at baseline. Avoid Nephrotoxic medications.  Elevated troponin: This could be secondary to demand ischemia in the setting of CHF exacerbation.. Patient denies any chest pain, EKG normal sinus rhythm Continue to trend troponin.  Neuropathy: Resume gabapentin and Lyrica once reconciliation complete.    DVT prophylaxis: Lovenox Code Status: Full code Family Communication: No family at bed side. Disposition Plan:  Status is: Inpatient Remains inpatient appropriate because: Admitted for new onset CHF exacerbation requiring IV  diuresis.   Consults called: Cardiology Admission status: Inpatient   Shawna Clamp MD [facility]   If 7PM-7AM, please contact  night-coverage [email or web address] Password [facility]  07/20/2021, 2:38 PM

## 2021-07-20 NOTE — ED Triage Notes (Signed)
Pt via POV from [facility], pt c/o SOB for the past couple of weeks and bilateral leg swelling at this time. Pt states that she has a hx of CHF. Pt is A&Ox4, O2 on arrival is 85% on RA and placed on [place] on 4L increased to 935.

## 2021-07-20 NOTE — Consult Note (Addendum)
[facility] NOTE       Patient ID: Renee Carpenter MRN: [id number] DOB/AGE: February 17, 1926 88 y.o.  Admit date: 07/20/2021 Referring Physician Dr. Shawna Clamp Primary Physician Dr. Miguel Aschoff Primary Cardiologist Dr. Nehemiah Massed (last seen in 2019)  Reason for Consultation CHF  HPI: The patient is a 88 year old female with a past medical history significant for HFrEF (LVEF 40% 06/2017), hypertension, OSA, type 2 diabetes, CKD 3 who presented to [facility] ED with worsening shortness of breath than her baseline.  She was reportedly hypoxic to the 80s at her nursing facility and EMS was called.  Cardiology is consulted for further assistance with her CHF.  She was given 40 mg of IV Lasix in the ED and output noted during interview looks about 775m. She is accompanied by her caregiver who contributes to the history.  Patient states that for the past 3 weeks she has had worsening shortness of breath and low oxygen levels, reportedly to the low 80s-90s.  For the past 2 months she has not been able to lie flat to sleep and has noticed some worsening swelling in her lower legs.  She was seen by pulmonology earlier this week and was instructed to wear her BiPAP (she reportedly has difficultly physically putting on the mask by herself).  Per the patient's caregiver, the patient loves to eat ice all day long, cannot quantify the exact amount she intakes.  The patient does not normally watch her salt intake either.  She was last seen by her regular cardiologist Dr. KNehemiah Massedin 2019 where her LVEF was 40%.  The patient has only been on losartan for GDMT and not on any sort of diuretic.  She is nonambulatory at baseline due to pain from a back surgery in the past but is able to transfer from chair to bed, etc. with assistance.  Labs on admission are notable for potassium 4.5, creatinine 1.3, EGFR 36 (appears to be at baseline), BNP markedly elevated to 1845, initial troponin 27.  H&H  12.9/38.3  Chest x-ray revealed enlargement of cardiac silhouette with pulmonary vascular congestion and diffuse pulmonary infiltrates favoring CHF, left pleural effusion with basilar atelectasis.   Review of systems complete and found to be negative unless listed above     Past Medical History:  Diagnosis Date   Anemia    WAS ANEMIC   Arthritis    Blood transfusion    HAD 1 SEVERAL YRS AGO...3-4 YR   Chronic kidney disease    SOME DECREASE IN KIDNEY FUNCTION   Diabetes mellitus without complication (HCC)    Hypertension    Normal cardiac stress test    REQUESTING COPY FROM [facility]   Normal echocardiogram    REQUESTING THAT RESULT -- DR KAHN    PONV (postoperative nausea and vomiting)    Shortness of breath    Spondylolysis 1998    Past Surgical History:  Procedure Laterality Date   ABDOMINAL HYSTERECTOMY     BACK SURGERY     2 CERVICAL SURGERIES ALSO   CATARACTS     RIGHT EYE CATARACT   CERVICAL DISCECTOMY  08/1995 and 2011   C4-,C5-6, C6-7 anterior cervical; Dr. EEllene Routefusion and plating   LAMINECTOMY     decompressive; [place]. [place]  04/14/2011   Procedure: LUMBAR LAMINECTOMY/DECOMPRESSION MICRODISCECTOMY;  Surgeon: JOphelia Charter  [facility];  Service: Neurosurgery;  Laterality: N/A;  Lumbar Three and Lumbar Four Laminectomy  TONSILLECTOMY AND ADENOIDECTOMY  1939    (Not in a hospital admission)  Social History   Socioeconomic History   Marital status: Widowed    Spouse name: Not on file   Number of children: 1   Years of education: Not on file   Highest education level: Bachelor's degree (e.g., BA, AB, BS)  Occupational History   Occupation: retired  Tobacco Use   Smoking status: Never   Smokeless tobacco: Never  Substance and Sexual Activity   Alcohol use: No   Drug use: No   Sexual activity: Never  Other Topics Concern   Not on file  Social History Narrative   Not on  file   Social Determinants of Health   Financial Resource Strain: Not on file  Food Insecurity: Not on file  Transportation Needs: Not on file  Physical Activity: Not on file  Stress: Not on file  Social Connections: Not on file  Intimate Partner Violence: Not on file    Family History  Problem Relation Age of Onset   Heart failure Father    Heart disease Father    Hypertension Mother    Diabetes Mother    Heart disease Mother    Cancer Sister        breast   Breast cancer Sister 60   Cancer Maternal Grandfather    Cancer Maternal Aunt        x3   Breast cancer Daughter 72      Review of systems complete and found to be negative unless listed above    PHYSICAL EXAM General: Pleasant elderly Caucasian female, well nourished, in no acute distress.  Sitting upright in ED stretcher with caretaker at bedside. HEENT:  Normocephalic and atraumatic.  Erythema around left eyelid. Neck:  No JVD.  Lungs: Conversational dyspnea on 4 L by [place].  Poor air movement, bibasilar crackles.  Heart: HRRR . Normal S1 and S2 without gallops or murmurs. Radial & DP pulses 2+ bilaterally. Abdomen: Soft, nontender, mildly-distended.  Msk: Normal strength and tone for age. Extremities: Warm and well perfused. No clubbing, cyanosis.  1+ left lower and trace right peripheral edema.  Neuro: Alert and oriented X 3. Psych:  Answers questions appropriately.   Labs:   Lab Results  Component Value Date   WBC 5.8 07/20/2021   HGB 12.9 07/20/2021   HCT 38.8 07/20/2021   MCV 122.0 (H) 07/20/2021   PLT 207 07/20/2021    Recent Labs  Lab 07/20/21 1158  NA 137  K 4.5  CL 102  CO2 26  BUN 19  CREATININE 1.38*  CALCIUM 8.7*  PROT 6.7  BILITOT 1.6*  ALKPHOS 77  ALT 8  AST 31  GLUCOSE 296*   No results found for: CKTOTAL, CKMB, CKMBINDEX, TROPONINI  Lab Results  Component Value Date   CHOL 149 08/26/2017   CHOL 142 02/27/2016   CHOL 149 01/15/2015   Lab Results  Component Value Date    HDL 47 08/26/2017   HDL 64 02/27/2016   HDL 51 01/15/2015   Lab Results  Component Value Date   LDLCALC 74 08/26/2017   LDLCALC 54 02/27/2016   LDLCALC 67 01/15/2015   Lab Results  Component Value Date   TRIG 141 08/26/2017   TRIG 120 02/27/2016   TRIG 155 (H) 01/15/2015   Lab Results  Component Value Date   CHOLHDL 3.2 08/26/2017   No results found for: LDLDIRECT    Radiology: DG Chest Portable 1 View  Result Date:  07/20/2021 CLINICAL DATA:  Shortness of breath EXAM: PORTABLE CHEST 1 VIEW COMPARISON:  Portable exam 1201 hours compared to 09/10/2020 Correlation: Cervical spine radiographs 01/24/2019 FINDINGS: Enlargement of cardiac silhouette with pulmonary vascular congestion. Atherosclerotic calcification aorta. BILATERAL perihilar to basilar infiltrates favor pulmonary edema and CHF. Small LEFT pleural effusion and LEFT basilar atelectasis. No pneumothorax. Prior cervical spine fusion with chronic fracture of the cervical plate. IMPRESSION: Enlargement of cardiac silhouette with pulmonary vascular congestion and diffuse pulmonary infiltrates favoring CHF. LEFT pleural effusion and basilar atelectasis. Aortic Atherosclerosis (ICD10-I70.0). Electronically Signed   By: Lavonia Dana M.D.   On: 07/20/2021 12:17    ECHO 06/2017 INTERPRETATION  MODERATE LV SYSTOLIC DYSFUNCTION (See above)   WITH MILD LVH  NORMAL RIGHT VENTRICULAR SYSTOLIC FUNCTION  MODERATE VALVULAR REGURGITATION (See above)  NO VALVULAR STENOSIS  MILD to MODERATE MR  MODERATE TR  MILD AR, PR  MILD PHTN  EF 40%   TELEMETRY reviewed by me: Sinus rhythm rate 85  EKG reviewed by me: NSR 90, LBBB similar to 09/2020  ASSESSMENT AND PLAN:  The patient is a 88 year old female with a past medical history significant for HFrEF (LVEF 40% 06/2017), hypertension, OSA, type 2 diabetes, CKD 3 who presented to [facility] ED with worsening shortness of breath than her baseline.  She was reportedly hypoxic to the 80s at her nursing  facility and EMS was called.  Cardiology is consulted for further assistance with her CHF.  #Acute on chronic HFrEF (LVEF 40% 06/2017) Patient presents with 3 weeks of worsening shortness of breath, and reported hypoxia with O2 sat in the low 80s to 90s per her caregiver.  She apparently loves to eat ice but cannot quantify the specific amount she intakes per day, and does not watch her salt. Appears to be volume overloaded on presentation, requiring 4 L of supplemental oxygen.  BNP elevated to 1845, left pleural effusion by chest x-ray. -S/p 40 mg IV Lasix in the ED with ~700 mL output during interview.  Continue diuresis with 40 mg IV Lasix twice daily with close monitoring of renal function -Hold home losartan due to CKD.  We will likely initiate beta-blockade likely tomorrow for GDMT -Agree with echocardiogram complete, will follow results -Strict monitoring of I's and O's while inpatient, daily weights  #Hypertension Blood pressure peaked at 162/88, holding losartan due to kidney function. Other plan as above.  #CKD 3 We will DC losartan.  Other plan as above. Appears to be at her baseline of creatinine 1.3, GFR 36.  #OSA -Recommend CPAP/BiPAP while inpatient.  This patient's plan of care was discussed and created with Dr. Nehemiah Massed and he is in agreement.  Signed: Tristan Schroeder , PA-C 07/20/2021, 2:51 PM [facility]  The patient has been interviewed and examined. I agree with assessment and plan above. Serafina Royals MD [facility]

## 2021-07-20 NOTE — Plan of Care (Signed)

## 2021-07-20 NOTE — Progress Notes (Signed)
Patient caregivers were in room with patient and stated that she needed her gabapentin, requip, and sinemet. Immediately messaged the doctor and asked him to order the meds. Once ordered took meds in to give patient and caregiver stated that they already took it upon themselves to give her her meds. Caregivers were already instructed by nurse not to give patient her home meds and that they needed to be taken home.

## 2021-07-20 NOTE — ED Provider Notes (Signed)
[facility] Provider Note    Event Date/Time   First MD Initiated Contact with Patient 07/20/21 1141     (approximate)   History   Shortness of Breath   HPI  Renee Carpenter is a 88 y.o. female with hypertension, anemia, diabetes who comes in with concerns for shortness of breath.  Patient reports shortness of breath for a long time.  However according to facility has been over the past few weeks getting worse to the point where this morning they noted that her oxygen levels were in the 80s so they called EMS.  She denies any new swelling in her legs, calf tenderness.  Denies any abdominal pain or chest pain.  She reports feeling more short of breath than normal.   Physical Exam   Triage Vital Signs: Blood pressure 134/83, pulse 86, temperature 97.8 F (36.6 C), temperature source Oral, resp. rate (!) 29, height 5\' 5"  (1.651 m), weight 77.1 kg, SpO2 92 %.   Most recent vital signs: Vitals:   07/20/21 1230 07/20/21 1330  BP: (!) 162/88 134/83  Pulse: 94 86  Resp: (!) 28 (!) 29  Temp:    SpO2: 91% 92%     General: Awake, no distress.  CV:  Good peripheral perfusion.  Resp:  Normal effort.  No wheezing, mild crackles Abd:  No distention. Other:  Trace edema bilaterally   ED Results / Procedures / Treatments   Labs (all labs ordered are listed, but only abnormal results are displayed) Labs Reviewed  CBC WITH DIFFERENTIAL/PLATELET - Abnormal; Notable for the following components:      Result Value   RBC 3.18 (*)    MCV 122.0 (*)    MCH 40.6 (*)    RDW 17.3 (*)    All other components within normal limits  COMPREHENSIVE METABOLIC PANEL - Abnormal; Notable for the following components:   Glucose, Bld 296 (*)    Creatinine, Ser 1.38 (*)    Calcium 8.7 (*)    Total Bilirubin 1.6 (*)    GFR, Estimated 36 (*)    All other components within normal limits  BRAIN NATRIURETIC PEPTIDE - Abnormal; Notable for the following components:   B  Natriuretic Peptide 1,845.6 (*)    All other components within normal limits  TROPONIN I (HIGH SENSITIVITY) - Abnormal; Notable for the following components:   Troponin I (High Sensitivity) 27 (*)    All other components within normal limits  RESP PANEL BY RT-PCR (FLU A&B, COVID) ARPGX2  URINALYSIS, ROUTINE W REFLEX MICROSCOPIC  CBC  CREATININE, SERUM  HEMOGLOBIN A1C  TROPONIN I (HIGH SENSITIVITY)     EKG  My interpretation of EKG:  Normal sinus rate of 90 without any ST elevation or T wave inversions except for V6.  Patient has left bundle branch block,  RADIOLOGY I have reviewed the xray personally and agree with radiology read patient has slight cardiomegaly with concern for pulmonary edema   PROCEDURES:  Critical Care performed: Yes, see critical care procedure note(s)  .1-3 Lead EKG Interpretation Performed by: 07/22/21, MD Authorized by: Concha Se, MD     Interpretation: normal     ECG rate:  80   ECG rate assessment: normal     Rhythm: sinus rhythm     Ectopy: none     Conduction: normal   .Critical Care Performed by: Concha Se, MD Authorized by: Concha Se, MD   Critical care provider statement:  Critical care time (minutes):  30   Critical care was necessary to treat or prevent imminent or life-threatening deterioration of the following conditions:  Respiratory failure   Critical care was time spent personally by me on the following activities:  Development of treatment plan with patient or surrogate, discussions with consultants, evaluation of patient's response to treatment, examination of patient, ordering and review of laboratory studies, ordering and review of radiographic studies, ordering and performing treatments and interventions, pulse oximetry, re-evaluation of patient's condition and review of old charts   MEDICATIONS ORDERED IN ED: Medications  DULoxetine (CYMBALTA) DR capsule 30 mg (has no administration in time range)   albuterol (PROVENTIL) (2.5 MG/3ML) 0.083% nebulizer solution 3 mL (has no administration in time range)  sodium chloride flush (NS) 0.9 % injection 3 mL (has no administration in time range)  sodium chloride flush (NS) 0.9 % injection 3 mL (has no administration in time range)  0.9 %  sodium chloride infusion (has no administration in time range)  acetaminophen (TYLENOL) tablet 650 mg (has no administration in time range)  ondansetron (ZOFRAN) injection 4 mg (has no administration in time range)  enoxaparin (LOVENOX) injection 30 mg (has no administration in time range)  furosemide (LASIX) injection 40 mg (has no administration in time range)  insulin aspart (novoLOG) injection 0-9 Units (has no administration in time range)  furosemide (LASIX) injection 40 mg (40 mg Intravenous Given 07/20/21 1348)     IMPRESSION / MDM / ASSESSMENT AND PLAN / ED COURSE  I reviewed the triage vital signs and the nursing notes.                              Patient with hypertension, anemia coming in for shortness of breath Differential diagnosis includes, but is not limited to, COVID, flu, pulmonary edema, ACS.  Considered PE but seems less likely and less no other cause of shortness of breath Patient initially got here she was satting in the 80s and had to be placed on oxygen   CBC no anemia CMP shows slightly elevated kidney function but stable from prior Troponin slightly elevated but similar to prior most likely demand COVID, flu are negative BNP is elevated  Given elevated BNP and chest x-ray concerning for CHF we will give 1 dose of IV Lasix.  Will discuss with hospital team for admission given patient is on 4 L of oxygen   The patient is on the cardiac monitor to evaluate for evidence of arrhythmia and/or significant heart rate changes.   FINAL CLINICAL IMPRESSION(S) / ED DIAGNOSES   Final diagnoses:  Acute on chronic congestive heart failure, unspecified heart failure type (HCC)  Acute  respiratory failure with hypoxia (HCC)     Rx / DC Orders   ED Discharge Orders     None        Note:  This document was prepared using Dragon voice recognition software and may include unintentional dictation errors.   Concha Se, MD 07/20/21 [phone]

## 2021-07-20 NOTE — ED Notes (Signed)
Ashley RN aware of assigned bed 

## 2021-07-21 ENCOUNTER — Inpatient Hospital Stay
Admit: 2021-07-21 | Discharge: 2021-07-21 | Disposition: A | Discharge status: Home / Self Care | Payer: Medicare PPO | Attending: Family Medicine | Admitting: Family Medicine

## 2021-07-21 DIAGNOSIS — J449 Chronic obstructive pulmonary disease, unspecified: Secondary | ICD-10-CM

## 2021-07-21 DIAGNOSIS — G20 Parkinson's disease: Secondary | ICD-10-CM

## 2021-07-21 DIAGNOSIS — I10 Essential (primary) hypertension: Secondary | ICD-10-CM

## 2021-07-21 DIAGNOSIS — G20A1 Parkinson's disease without dyskinesia, without mention of fluctuations: Secondary | ICD-10-CM

## 2021-07-21 DIAGNOSIS — J9601 Acute respiratory failure with hypoxia: Secondary | ICD-10-CM

## 2021-07-21 DIAGNOSIS — I5043 Acute on chronic combined systolic (congestive) and diastolic (congestive) heart failure: Secondary | ICD-10-CM

## 2021-07-21 DIAGNOSIS — E1122 Type 2 diabetes mellitus with diabetic chronic kidney disease: Secondary | ICD-10-CM

## 2021-07-21 LAB — ECHOCARDIOGRAM COMPLETE
AR max vel: 1.6 cm2
AV Area VTI: 1.64 cm2
AV Area mean vel: 1.7 cm2
AV Mean grad: 4 mmHg
AV Peak grad: 7.7 mmHg
Ao pk vel: 1.39 m/s
Area-P 1/2: 8.34 cm2
Height: 65 in
MV VTI: 1.37 cm2
S' Lateral: 4.66 cm
Weight: 2740.76 oz

## 2021-07-21 LAB — CBC
HCT: 40 % (ref 36.0–46.0)
Hemoglobin: 13.4 g/dL (ref 12.0–15.0)
MCH: 39.9 pg — ABNORMAL HIGH (ref 26.0–34.0)
MCHC: 33.5 g/dL (ref 30.0–36.0)
MCV: 119 fL — ABNORMAL HIGH (ref 80.0–100.0)
Platelets: 197 10*3/uL (ref 150–400)
RBC: 3.36 MIL/uL — ABNORMAL LOW (ref 3.87–5.11)
RDW: 17.3 % — ABNORMAL HIGH (ref 11.5–15.5)
WBC: 5.6 10*3/uL (ref 4.0–10.5)
nRBC: 0 % (ref 0.0–0.2)

## 2021-07-21 LAB — COMPREHENSIVE METABOLIC PANEL
ALT: 8 U/L (ref 0–44)
AST: 27 U/L (ref 15–41)
Albumin: 3.2 g/dL — ABNORMAL LOW (ref 3.5–5.0)
Alkaline Phosphatase: 61 U/L (ref 38–126)
Anion gap: 8 (ref 5–15)
BUN: 18 mg/dL (ref 8–23)
CO2: 29 mmol/L (ref 22–32)
Calcium: 8.8 mg/dL — ABNORMAL LOW (ref 8.9–10.3)
Chloride: 105 mmol/L (ref 98–111)
Creatinine, Ser: 1.26 mg/dL — ABNORMAL HIGH (ref 0.44–1.00)
GFR, Estimated: 40 mL/min — ABNORMAL LOW (ref >60)
Glucose, Bld: 105 mg/dL — ABNORMAL HIGH (ref 70–99)
Potassium: 4.1 mmol/L (ref 3.5–5.1)
Sodium: 142 mmol/L (ref 135–145)
Total Bilirubin: 1.4 mg/dL — ABNORMAL HIGH (ref 0.3–1.2)
Total Protein: 6.2 g/dL — ABNORMAL LOW (ref 6.5–8.1)

## 2021-07-21 LAB — MAGNESIUM: Magnesium: 2.1 mg/dL (ref 1.7–2.4)

## 2021-07-21 LAB — GLUCOSE, CAPILLARY
Glucose-Capillary: 149 mg/dL — ABNORMAL HIGH (ref 70–99)
Glucose-Capillary: 231 mg/dL — ABNORMAL HIGH (ref 70–99)
Glucose-Capillary: 214 mg/dL — ABNORMAL HIGH (ref 70–99)
Glucose-Capillary: 163 mg/dL — ABNORMAL HIGH (ref 70–99)

## 2021-07-21 LAB — HEMOGLOBIN A1C
Hgb A1c MFr Bld: 7.7 % — ABNORMAL HIGH (ref 4.8–5.6)
Mean Plasma Glucose: 174.29 mg/dL

## 2021-07-21 LAB — PHOSPHORUS: Phosphorus: 3.4 mg/dL (ref 2.5–4.6)

## 2021-07-21 LAB — BRAIN NATRIURETIC PEPTIDE: B Natriuretic Peptide: 2091 pg/mL — ABNORMAL HIGH (ref 0.0–100.0)

## 2021-07-21 MED ORDER — TAMSULOSIN HCL 0.4 MG PO CAPS
0.4000 mg | ORAL_CAPSULE | Freq: Every day | ORAL | Status: DC
  Administered 2021-07-21 – 2021-07-24 (×4): 0.4 mg via ORAL
  Filled 2021-07-21 (×4): qty 1

## 2021-07-21 MED ORDER — SENNOSIDES-DOCUSATE SODIUM 8.6-50 MG PO TABS: 1.0000 | ORAL_TABLET | Freq: Two times a day (BID) | ORAL | Status: DC | PRN

## 2021-07-21 MED ORDER — METOPROLOL SUCCINATE ER 25 MG PO TB24
12.5000 mg | ORAL_TABLET | Freq: Every day | ORAL | Status: DC
  Administered 2021-07-21 – 2021-07-24 (×4): 12.5 mg via ORAL
  Filled 2021-07-21 (×4): qty 1

## 2021-07-21 MED ORDER — GABAPENTIN 600 MG PO TABS
300.0000 mg | ORAL_TABLET | Freq: Three times a day (TID) | ORAL | Status: DC
  Administered 2021-07-21 – 2021-07-23 (×8): 300 mg via ORAL
  Filled 2021-07-21 (×8): qty 1

## 2021-07-21 NOTE — Progress Notes (Signed)
Progress Note   Patient: Renee Carpenter [id number] DOB: [birth year] DOA: 07/20/2021     1 DOS: the patient was seen and examined on 07/21/2021       Brief hospital course: Mrs. Relph is a 88 y.o. F with sCHF EF 40-45%, well controlled, spinal stenosis, DM, Parkinson's disease, HTN and OSA who presented from home with hypoxia after noting 3 weeks progressive SOB, worsening orthopnea and leg swelling.  In the ER, SpO2 85%, tachypneic.  Needed up to 5L O2 overnight.  CXR showed bilateral infiltrates consistent with edema, BNP ~2000.  Started on IV Lasix and admitted for CHF.      Assessment and Plan: * Acute on chronic combined systolic and diastolic CHF (congestive heart failure) (HCC) Made good urine output observed by Cardiology to furosemide 40 IV in the ER.  No I/Os recorded overnight.  Echo here with EF down to 25-30%, grade I DD, no signficant valvular disease.  Still on O2, but feels symptoms are better. -Continue furosemide 40 mg IV twice a day  -K supplement -Strict I/Os, daily weights, telemetry  -Daily monitoring renal function -Obtain echo -Consult Cardiology, appreciate expertise        Acute respiratory failure with hypoxia (HCC) Hypoxic and in respiratory distress at admission, needing 5 to 6 L O2 overnight.  Due to CHF   Parkinson's disease ([place]) - Continue Sinemet, ropinirole  Type 2 diabetes mellitus with chronic kidney disease, without long-term current use of insulin (HCC) A1c 7.7 - Hold glimepiride - Sliding scale correction  OSA (obstructive sleep apnea)- (present on admission) - CPAP at night  Stage 3b chronic kidney disease (CKD) ([place])- (present on admission) Creatinine slightly improved with diuresis overnight, baseline 1.2-1.6  Anxiety, generalized- (present on admission) - Continue Cymbalta  COPD, mild ([place])- (present on admission) No wheezing - Albuterol as needed  Essential hypertension- (present on admission) Blood  pressure controlled - Continue Lasix, metoprolol        Subjective: Patient still requiring oxygen still out of breath, but her breathing seems slightly better from before, less orthopnea.  No confusion, fever, sputum.  No wheezing.  Physical Exam: Vitals:   07/21/21 0732 07/21/21 1030 07/21/21 1100 07/21/21 1159  BP: 129/84   125/66  Pulse: 96   89  Resp:    (!) 22  Temp: 98.3 F (36.8 C)   97.7 F (36.5 C)  TempSrc:      SpO2: 95% (!) 89% 90% 92%  Weight:      Height:       Frail elderly female, lying in bed, oxygen mask in place. Respiratory rate slightly increased, seems out of breath with talking, minimal crackles at bases, otherwise pretty good air movement, no wheezing RRR, I do not appreciate murmurs, no lower extremity edema, JVP is elevated to the jaw Abdomen soft tenderness palpation or guarding Awake and alert, extraocular movements intact, face symmetric, moves upper extremities with generalized weakness but symmetric strength Attention normal, affect appropriate, judgment insight appeared fairly good  Data Reviewed: Discussed with cardiology My review of labs and imaging is notable for telemetry showing IVCD, no significant ectopy or arrhythmia Creatinine 1.4 down to 1.2 overnight Macrocytosis Hemoglobin A1c 7.7% Potassium normal, creatinine slightly improved.     Family Communication: Caregiver and guardian at the bedside  Disposition: Status is: Inpatient Remains inpatient appropriate because: She will require ongoing IV Lasix for her hypoxic respiratory failure still requiring oxygen.  In the best case, the patient will be able to diurese  off of oxygen within 2 to 3 days, and transition back home (facility independent living?) with supports already in place          Planned Discharge Destination:  TBD      Author: Edwin Dada, MD 07/21/2021 2:23 PM  For on call review [email or web address].

## 2021-07-21 NOTE — Assessment & Plan Note (Signed)
-   CPAP at night °

## 2021-07-21 NOTE — Progress Notes (Signed)
*  PRELIMINARY RESULTS* Echocardiogram 2D Echocardiogram has been performed.  Renee Carpenter 07/21/2021, 11:00 AM

## 2021-07-21 NOTE — Assessment & Plan Note (Addendum)
baseline 1.2-1.6, currently within range.

## 2021-07-21 NOTE — Progress Notes (Signed)
PT Cancellation Note  Patient Details Name: Renee Carpenter MRN: [id number] DOB: 09/25/1925   Cancelled Treatment:    Reason Eval/Treat Not Completed: Medical issues which prohibited therapy Attempted to see pt this AM.  On arrival pt had recently had breathing treatment but on 2L saturations were in the low 80s, discussed with nursing and bumped to 4L but sats still remaining low.  Deferred PT/activity this AM, will maintain on caseload and continued to follow as appropriate.    Malachi Pro, DPT 07/21/2021, 1:53 PM

## 2021-07-21 NOTE — Progress Notes (Signed)
PT Cancellation Note  Patient Details Name: Renee Carpenter MRN: [id number] DOB: 1925/07/11   Cancelled Treatment:    Reason Eval/Treat Not Completed: Other (comment) Attempted to see pt this afternoon, she was on [facility] with staff.  Apparently she has needed more O2 t/o the day but has been able to do a little mobility (with OT and now with RN).  Will plan on PT exam tomorrow as appropriate.   Kreg Shropshire, DPT 07/21/2021, 4:44 PM

## 2021-07-21 NOTE — Evaluation (Signed)
Occupational Therapy Evaluation Patient Details Name: Renee Carpenter MRN: [id number] DOB: 01-20-1926 Today's Date: 07/21/2021   History of Present Illness Renee Carpenter is a 88 year old female with a past medical history significant for HFrEF (LVEF 40% 06/2017), hypertension, OSA, type 2 diabetes, CKD 3 who presented to [facility] ED with worsening SOB over the last 3 weeks.  She was reportedly hypoxic to the 80s at her nursing facility and EMS was called.  Chest x-ray revealed enlargement of cardiac silhouette with pulmonary vascular congestion and diffuse pulmonary infiltrates favoring CHF, left pleural effusion with basilar atelectasis. Pt admitted for further management. Of note, pivots to [facility] at baseline for mobility. From [facility].   Clinical Impression   Renee Carpenter was seen for OT evaluation this date. Prior to hospital admission, pt required assist from PCA's for bathing, dressing, and occasional assistance with transfers to her WC. Pt lives at an assistive living facility and has baseline support for ADL/IADL management. Currently pt demonstrates impairments as described below (See OT problem list) which functionally limit *his/her ability to perform ADL/self-care tasks. Pt currently requires MIN-MOD A for LB ADL management, SUPERVISION for squat pivot t/f, and SET-UP for UB ADL management.  Pt would benefit from skilled OT services to address noted impairments and functional limitations (see below for any additional details) in order to maximize safety and independence while minimizing falls risk and caregiver burden. Do not anticipate the need for follow up OT services upon acute DC.        Recommendations for follow up therapy are one component of a multi-disciplinary discharge planning process, led by the attending physician.  Recommendations may be updated based on patient status, additional functional criteria and insurance authorization.   Follow Up Recommendations  No OT  follow up    Assistance Recommended at Discharge Intermittent Supervision/Assistance (Baseline)  Patient can return home with the following A little help with walking and/or transfers;A little help with bathing/dressing/bathroom;Assistance with cooking/housework;Assist for transportation    Functional Status Assessment  Patient has had a recent decline in their functional status and demonstrates the ability to make significant improvements in function in a reasonable and predictable amount of time.  Equipment Recommendations  None recommended by OT    Recommendations for Other Services       Precautions / Restrictions Precautions Precautions: Fall Restrictions Weight Bearing Restrictions: No      Mobility Bed Mobility Overal bed mobility: Needs Assistance Bed Mobility: Supine to Sit     Supine to sit: Supervision, HOB elevated     General bed mobility comments: Comes to sitting at EOB with min increased time/effort to perfom.    Transfers Overall transfer level: Needs assistance Equipment used: 1 person hand held assist Transfers: Bed to chair/wheelchair/BSC     Squat pivot transfers: Supervision, Min guard       General transfer comment: Min guard from PCA. Baseline.      Balance Overall balance assessment: No apparent balance deficits (not formally assessed)                                         ADL either performed or assessed with clinical judgement   ADL Overall ADL's : Needs assistance/impaired  General ADL Comments: Pt is functionally limited by cardiopulmonary status, generalized weakness, and decreased activity tolerance. She requires 6L[place] t/o session (not on O2 at baseline) to maintain O2 sats in mid 90's. She is able to perform Squat pivot t/f to room recliner with baseline assist form PCA. Requires SET-UP Assist for UB grooming while seated in recliner. Anticipate MIN A For  LB dressing and bathing, at or near baseline.     Vision Patient Visual Report: No change from baseline       Perception     Praxis      Pertinent Vitals/Pain Pain Assessment Pain Assessment: Faces Faces Pain Scale: Hurts a little bit Pain Location: L side of neck Pain Descriptors / Indicators: Aching, Sore Pain Intervention(s): Limited activity within patient's tolerance, Repositioned, Monitored during session     Hand Dominance     Extremity/Trunk Assessment Upper Extremity Assessment Upper Extremity Assessment: Generalized weakness   Lower Extremity Assessment Lower Extremity Assessment: Generalized weakness   Cervical / Trunk Assessment Cervical / Trunk Assessment: Kyphotic   Communication Communication Communication: No difficulties   Cognition Arousal/Alertness: Awake/alert Behavior During Therapy: WFL for tasks assessed/performed Overall Cognitive Status: Within Functional Limits for tasks assessed                                 General Comments: Pleasant, conversational, eager to participate in session.     General Comments  Pt remains on 6L [place] t/o session. VSS. Pt denies adverse S/S. Min SOB with exertional activity but O2 sats remain in low-mid 90's t/o session. Educatedon PLB technique for energy conservation.    Exercises Other Exercises Other Exercises: Pt/caregiver educated on role of OT in acute setting, energy conservation strategies including PLB, and routines modifications to support safety and functional independence   Shoulder Instructions      Home Living Family/patient expects to be discharged to:: Assisted living   Available Help at Discharge: Personal care attendant Type of Home: Apartment Home Access: Level entry     Home Layout: One level     Bathroom Shower/Tub: Producer, television/film/video: Handicapped height     Home Equipment: Wheelchair - manual;Electric scooter;Grab bars - tub/shower;Grab bars -  toilet;Hand held shower head;Tub bench          Prior Functioning/Environment Prior Level of Function : Needs assist       Physical Assist : Mobility (physical);ADLs (physical) Mobility (physical): Transfers ADLs (physical): Bathing;Dressing;IADLs;Toileting Mobility Comments: Pt squat pivots to WC at baseline. Generally does not require assistance. PCA's provide supervision for safety, unless pt is having a bad day. ADLs Comments: PCA's provide assist for dressing and bathing. Pt uses WC/Scooter for functional mobility. Generally eats meals at ALF dining facilities.        OT Problem List: Decreased strength;Decreased coordination;Decreased range of motion;Impaired sensation;Decreased activity tolerance;Decreased safety awareness;Impaired balance (sitting and/or standing);Decreased knowledge of use of DME or AE;Impaired UE functional use      OT Treatment/Interventions: Self-care/ADL training;Therapeutic exercise;Therapeutic activities;DME and/or AE instruction;Energy conservation;Patient/family education;Balance training    OT Goals(Current goals can be found in the care plan section) Acute Rehab OT Goals Patient Stated Goal: To go back to ALF OT Goal Formulation: With patient Time For Goal Achievement: 08/04/21 Potential to Achieve Goals: Good ADL Goals Pt Will Perform Grooming: sitting;with modified independence Pt Will Transfer to Toilet: bedside commode;with set-up;with supervision;squat pivot transfer (with caregiver independent with assisting.)  Pt Will Perform Toileting - Clothing Manipulation and hygiene: with set-up;with supervision;sitting/lateral leans;with caregiver independent in assisting  OT Frequency: Min 2X/week    Co-evaluation              AM-PAC OT "6 Clicks" Daily Activity     Outcome Measure Help from another person eating meals?: None Help from another person taking care of personal grooming?: None Help from another person toileting, which includes  using toliet, bedpan, or urinal?: A Lot Help from another person bathing (including washing, rinsing, drying)?: A Little Help from another person to put on and taking off regular upper body clothing?: A Little Help from another person to put on and taking off regular lower body clothing?: A Little 6 Click Score: 19   End of Session Equipment Utilized During Treatment: Oxygen  Activity Tolerance: Patient tolerated treatment well Patient left: in chair;with call bell/phone within reach;with family/visitor present (Per aid, pt requests no fall alarm while PCA is present. Voices plan to inform nsg if she leaves so fall alarm can be set.)  OT Visit Diagnosis: Other abnormalities of gait and mobility (R26.89);Muscle weakness (generalized) (M62.81)                Time: 2992-4268 OT Time Calculation (min): 43 min Charges:  OT General Charges $OT Visit: 1 Visit OT Evaluation $OT Eval Moderate Complexity: 1 Mod OT Treatments $Self Care/Home Management : 23-37 mins  Rockney Ghee, M.S., OTR/L Feeding Team - [facility] Ascom: [phone] 07/21/21, 3:56 PM

## 2021-07-21 NOTE — Assessment & Plan Note (Signed)
-   Continue Sinemet, ropinirole

## 2021-07-21 NOTE — Progress Notes (Signed)
[facility] NOTE       Patient ID: Renee Carpenter MRN: [id number] DOB/AGE: Dec 28, 1925 88 y.o.  Admit date: 07/20/2021 Referring Physician Dr. Shawna Clamp Primary Physician Dr. Miguel Aschoff Primary Cardiologist Dr. Nehemiah Massed (last seen in 2019)  Reason for Consultation CHF  HPI: The patient is a 88 year old female with a past medical history significant for HFrEF (LVEF 40% 06/2017), hypertension, OSA, type 2 diabetes, CKD 3 who presented to [facility] ED with worsening shortness of breath than her baseline.  She was reportedly hypoxic to the 80s at her nursing facility and EMS was called.  Cardiology is consulted for further assistance with her CHF.  Interval history: -feels better, happy to sit up on edge of bed and eat breakfast. No chest pain.  -Renal function slightly improving overnight with diuresis -output during interview after morning lasix around 685m    Review of systems complete and found to be negative unless listed above   Past Medical History:  Diagnosis Date   Anemia    WAS ANEMIC   Arthritis    Blood transfusion    HAD 1 SEVERAL YRS AGO...3-4 YR   Chronic kidney disease    SOME DECREASE IN KIDNEY FUNCTION   Diabetes mellitus without complication (HCC)    Hypertension    Normal cardiac stress test    REQUESTING COPY FROM [facility]   Normal echocardiogram    REQUESTING THAT RESULT -- DR KAHN    PONV (postoperative nausea and vomiting)    Shortness of breath    Spondylolysis 1998    Past Surgical History:  Procedure Laterality Date   ABDOMINAL HYSTERECTOMY     BACK SURGERY     2 CERVICAL SURGERIES ALSO   CATARACTS     RIGHT EYE CATARACT   CERVICAL DISCECTOMY  08/1995 and 2011   C4-,C5-6, C6-7 anterior cervical; Dr. EEllene Routefusion and plating   LAMINECTOMY     decompressive; Dr. sBurman Riisin DShokanMICRODISCECTOMY  04/14/2011   Procedure: LUMBAR LAMINECTOMY/DECOMPRESSION MICRODISCECTOMY;   Surgeon: JOphelia Charter  [facility];  Service: Neurosurgery;  Laterality: N/A;  Lumbar Three and Lumbar Four Laminectomy   TONSILLECTOMY AND ADENOIDECTOMY  1939    Medications Prior to Admission  Medication Sig Dispense Refill Last Dose   carbidopa-levodopa (SINEMET IR) 25-100 MG tablet Take 1 tablet by mouth 3 (three) times daily. Take 1 tablet three times a day   07/20/2021 at 0800   Coenzyme Q10 (CO Q10) 200 MG CAPS Take 1 capsule by mouth daily.   07/20/2021 at 0800   DULoxetine (CYMBALTA) 20 MG capsule Take 40 mg by mouth daily.   07/20/2021 at 0800   esomeprazole (NEXIUM) 40 MG capsule TAKE 1 CAPSULE BY MOUTH EVERY DAY (Patient taking differently: Take 40 mg by mouth daily. TAKE 1 CAPSULE BY MOUTH EVERY DAY) 90 capsule 1 07/20/2021 at 0800   gabapentin (NEURONTIN) 600 MG tablet TAKE 1 TABLET(600 MG) BY MOUTH THREE TIMES DAILY (Patient taking differently: Take 600 mg by mouth 3 (three) times daily.) 270 tablet 3 07/20/2021 at 0800   glimepiride (AMARYL) 4 MG tablet TAKE 1 TABLET(4 MG) BY MOUTH DAILY BEFORE BREAKFAST 90 tablet 1 07/20/2021 at 0800   MAGNESIUM-OXIDE 400 (241.3 Mg) MG tablet TAKE 1 TABLET BY MOUTH TWICE DAILY 60 tablet 11 07/20/2021 at 0800   MULTIPLE VITAMIN PO Take 1 tablet by mouth daily.   07/20/2021 at 0800   Probiotic Product (ALIGN PO) Take 1 capsule  by mouth daily.   07/20/2021 at 0800   rOPINIRole (REQUIP) 0.5 MG tablet Take 0.5 mg by mouth 3 (three) times daily.   07/20/2021 at 0800   tamsulosin (FLOMAX) 0.4 MG CAPS capsule TAKE 1 CAPSULE(0.4 MG) BY MOUTH DAILY 90 capsule 3 07/20/2021 at 0800   Accu-Chek FastClix Lancets MISC USE TO TEST BLOOD SUGAR UP TO 4 TIMES DAILY AS DIRECTED 102 each 1    ACCU-CHEK GUIDE test strip USE TO TEST BLOOD SUGAR UP TO 4 X DAILY 100 strip 1    acetaminophen (TYLENOL) 500 MG tablet Take 500 mg by mouth every 6 (six) hours as needed. (Patient not taking: Reported on 07/20/2021)   Not Taking at unknown   albuterol (VENTOLIN HFA) 108 (90  Base) MCG/ACT inhaler INHALE 2 PUFFS INTO THE LUNGS EVERY 4 HOURS AS NEEDED FOR WHEEZING OR SHORTNESS OF BREATH 6.7 g 3 prn at unknown   ALPHA LIPOIC ACID PO Take by mouth daily. (Patient not taking: Reported on 07/20/2021)   Not Taking   blood glucose meter kit and supplies KIT Dispense based on patient and insurance preference. Use up to four times daily as directed. (FOR ICD-9 250.00, 250.01). 1 each 0    Blood Glucose Monitoring Suppl (ACCU-CHEK GUIDE) w/Device KIT Check blood sugars daily as directed 1 kit 0    DULoxetine (CYMBALTA) 30 MG capsule Take 1 capsule (30 mg total) by mouth daily. (Patient not taking: Reported on 07/20/2021) 30 capsule 2 Not Taking   hydroxypropyl methylcellulose (ISOPTO TEARS) 2.5 % ophthalmic solution Place 1 drop into both eyes 3 (three) times daily as needed. Dry eyes    prn at unknown   senna-docusate (SENOKOT-S) 8.6-50 MG tablet Take 1 tablet by mouth daily as needed.    prn at unknown    Social History   Socioeconomic History   Marital status: Widowed    Spouse name: Not on file   Number of children: 1   Years of education: Not on file   Highest education level: Bachelor's degree (e.g., BA, AB, BS)  Occupational History   Occupation: retired  Tobacco Use   Smoking status: Never   Smokeless tobacco: Never  Substance and Sexual Activity   Alcohol use: No   Drug use: No   Sexual activity: Never  Other Topics Concern   Not on file  Social History Narrative   Not on file   Social Determinants of Health   Financial Resource Strain: Not on file  Food Insecurity: Not on file  Transportation Needs: Not on file  Physical Activity: Not on file  Stress: Not on file  Social Connections: Not on file  Intimate Partner Violence: Not on file    Family History  Problem Relation Age of Onset   Heart failure Father    Heart disease Father    Hypertension Mother    Diabetes Mother    Heart disease Mother    Cancer Sister        breast   Breast cancer  Sister 51   Cancer Maternal Grandfather    Cancer Maternal Aunt        x3   Breast cancer Daughter 61      Review of systems complete and found to be negative unless listed above    PHYSICAL EXAM General: Pleasant elderly Caucasian female, well nourished, in no acute distress.  Sitting upright in PCU bed with caretaker at bedside. HEENT:  Normocephalic and atraumatic.  Erythema around left eyelid. Neck:  No  JVD.  Lungs: Normal work of breathing on Venturi mask. bibasilar crackles.   Heart: HRRR . Normal S1 and S2 without gallops or murmurs. Radial & DP pulses 2+ bilaterally. Abdomen: Soft, nontender, mildly-distended.  Msk: Normal strength and tone for age. Extremities: Warm and well perfused. No clubbing, cyanosis.  1+ left lower and trace right peripheral edema.  Neuro: Alert and oriented X 3. Psych:  Answers questions appropriately.   Labs:   Lab Results  Component Value Date   WBC 5.6 07/21/2021   HGB 13.4 07/21/2021   HCT 40.0 07/21/2021   MCV 119.0 (H) 07/21/2021   PLT 197 07/21/2021    Recent Labs  Lab 07/21/21 0459  NA 142  K 4.1  CL 105  CO2 29  BUN 18  CREATININE 1.26*  CALCIUM 8.8*  PROT 6.2*  BILITOT 1.4*  ALKPHOS 61  ALT 8  AST 27  GLUCOSE 105*    No results found for: CKTOTAL, CKMB, CKMBINDEX, TROPONINI  Lab Results  Component Value Date   CHOL 149 08/26/2017   CHOL 142 02/27/2016   CHOL 149 01/15/2015   Lab Results  Component Value Date   HDL 47 08/26/2017   HDL 64 02/27/2016   HDL 51 01/15/2015   Lab Results  Component Value Date   LDLCALC 74 08/26/2017   LDLCALC 54 02/27/2016   LDLCALC 67 01/15/2015   Lab Results  Component Value Date   TRIG 141 08/26/2017   TRIG 120 02/27/2016   TRIG 155 (H) 01/15/2015   Lab Results  Component Value Date   CHOLHDL 3.2 08/26/2017   No results found for: LDLDIRECT    Radiology: DG Chest Portable 1 View  Result Date: 07/20/2021 CLINICAL DATA:  Shortness of breath EXAM: PORTABLE CHEST 1  VIEW COMPARISON:  Portable exam 1201 hours compared to 09/10/2020 Correlation: Cervical spine radiographs 01/24/2019 FINDINGS: Enlargement of cardiac silhouette with pulmonary vascular congestion. Atherosclerotic calcification aorta. BILATERAL perihilar to basilar infiltrates favor pulmonary edema and CHF. Small LEFT pleural effusion and LEFT basilar atelectasis. No pneumothorax. Prior cervical spine fusion with chronic fracture of the cervical plate. IMPRESSION: Enlargement of cardiac silhouette with pulmonary vascular congestion and diffuse pulmonary infiltrates favoring CHF. LEFT pleural effusion and basilar atelectasis. Aortic Atherosclerosis (ICD10-I70.0). Electronically Signed   By: Lavonia Dana M.D.   On: 07/20/2021 12:17    ECHO 06/2017 INTERPRETATION  MODERATE LV SYSTOLIC DYSFUNCTION (See above)   WITH MILD LVH  NORMAL RIGHT VENTRICULAR SYSTOLIC FUNCTION  MODERATE VALVULAR REGURGITATION (See above)  NO VALVULAR STENOSIS  MILD to MODERATE MR  MODERATE TR  MILD AR, PR  MILD PHTN  EF 40%   TELEMETRY reviewed by me: Sinus rhythm rate 85  EKG reviewed by me: NSR 90, LBBB similar to 09/2020  ASSESSMENT AND PLAN:  The patient is a 88 year old female with a past medical history significant for HFrEF (LVEF 40% 06/2017), hypertension, OSA, type 2 diabetes, CKD 3 who presented to [facility] ED with worsening shortness of breath than her baseline.  She was reportedly hypoxic to the 80s at her nursing facility and EMS was called.  Cardiology is consulted for further assistance with her CHF.  #Acute on chronic HFrEF (LVEF 40% 06/2017) Patient presents with 3 weeks of worsening shortness of breath, and reported hypoxia with O2 sat in the low 80s to 90s per her caregiver.  She apparently loves to eat ice but cannot quantify the specific amount she intakes per day, and does not watch her salt. Appears to be  volume overloaded on presentation, requiring 4 L of supplemental oxygen.  BNP elevated to 1845, left  pleural effusion by chest x-ray. -S/p 40 mg IV Lasix in the ED with ~700 mL on 2/13 and during interview about 638m noted. Continue diuresis with 40 mg IV Lasix twice daily with close monitoring of renal function -Hold home losartan due to CKD.   -initiate at low dose metoprolol today.  -echocardiogram complete performed this morning and results pending read -Strict monitoring of I's and O's while inpatient, daily weights  #Hypertension Blood pressure better controlled today. holding losartan due to kidney function. Other plan as above.  #CKD 3 We will DC losartan.  Other plan as above. Renal function improving with diuresis - creatinine 1.3-1.26, GFR 36-40  #OSA -Recommend CPAP/BiPAP nightly while inpatient.  This patient's plan of care was discussed and created with Dr. KNehemiah Massedand he is in agreement.  Signed: LTristan Schroeder, PA-C 07/21/2021, 10:35 AM [facility]

## 2021-07-21 NOTE — Assessment & Plan Note (Signed)
A1c 7.7 - Hold glimepiride - Sliding scale correction

## 2021-07-21 NOTE — Assessment & Plan Note (Signed)
Continue Cymbalta.

## 2021-07-21 NOTE — Hospital Course (Addendum)
Renee Carpenter is a 88 y.o. F with sCHF EF 40-45%, well controlled, spinal stenosis, DM, Parkinson's disease, HTN and OSA who presented from home with hypoxia after noting 3 weeks progressive SOB, worsening orthopnea and leg swelling.  In the ER, SpO2 85%, tachypneic.  Needed up to 5L O2 overnight.  CXR showed bilateral infiltrates consistent with edema, BNP ~2000.  Started on IV Lasix and admitted for CHF.

## 2021-07-21 NOTE — Assessment & Plan Note (Addendum)
Echo here with EF down to 25-30%, grade I DD, no signficant valvular disease. --Still on O2, but feels symptoms are better. --s/p furosemide 40 mg IV twice a day, transitioned to oral lasix 40 mg daily on 2/15 Plan: --cont oral lasix 40 mg daily --cont Toprol --hold home losartan 2/2 CKD

## 2021-07-21 NOTE — Consult Note (Signed)
° °  Heart Failure Nurse Navigator Note  HFrEF 40% by echocardiogram performed in 2019.  Echocardiogram performed on this admission reveals ejection fraction of 25 to 30%.  The left ventricle internal cavity is mildly to moderately dilated.  Grade 1 diastolic dysfunction normal right ventricular systolic function.  Tricuspid regurgitation is mild to moderate.  She presented to the emergency room with complaints of generalized weakness, bilateral leg swelling and difficulty breathing which she had noticed for the last 3 to 4 weeks.  She also had noted orthopnea sleeping in a recliner the last few days before she presented.  Comorbidities:  Arthritis Chronic kidney disease Diabetes Hypertension  Labs:  BNP 2091, hemoglobin A1c 7.7, sodium 142, potassium 4.1, chloride 105, CO2 29, BUN 18, creatinine 1.26, total protein 6.2, albumin 3.2, GFR 40.  Hemoglobin 13.4, hematocrit 40. Weight is 77.7 kg Blood pressure 125/66 Intake not documented Output 1500 mL  Initial meeting with patient who is sitting at the bedside currently on 6 L of O2 per nasal cannula and her caregiver Rose.  Discussed heart failure, and discussed the function of the left ventricle.  He states that she had not heard the term heart failure in relation ship to herself before.  Nor has she had presented with any of these symptoms before.  Discussed the importance of low-sodium diet and removing the salt Shaker from the table.  She states that she loves her salt.  Discussed other options that could be used.  She and the caregiver voiced understanding.  Also discussed the importance of fluid restriction.  And the relationship between sodium and fluids.  Patient is unable to stand on a scale unless she has bars for support.  Discussed  with her and the caregiver using a flat bottom chair and having her sit on the scale.  Went over what to report as far as weight gains.  Also stressed just in symptoms to report.  She has an  appointment in the outpatient heart failure clinic on February 20 at 9 AM.  She has an 8% ratio of no-shows.  Which is 6 out of 79 appointments.  She was given the living with heart failure teaching booklet, along with stone magnet and information on low-sodium.  They had no further questions but instructed would continue to follow along.  Pricilla Riffle RN CHFN

## 2021-07-21 NOTE — Assessment & Plan Note (Addendum)
Blood pressure controlled --cont metop --cont lasix as oral 40 mg daily  --hold home losartan 2/2 CKD

## 2021-07-21 NOTE — Assessment & Plan Note (Addendum)
Hypoxic and in respiratory distress at admission, needing 5 to 6 L O2.  Due to CHF  --O2 requirement down to 3L today --Continue supplemental O2 to keep sats between 88-92%, wean as tolerated

## 2021-07-21 NOTE — Assessment & Plan Note (Signed)
No wheezing - Albuterol as needed

## 2021-07-22 ENCOUNTER — Inpatient Hospital Stay: Payer: Medicare PPO

## 2021-07-22 LAB — GLUCOSE, CAPILLARY
Glucose-Capillary: 124 mg/dL — ABNORMAL HIGH (ref 70–99)
Glucose-Capillary: 209 mg/dL — ABNORMAL HIGH (ref 70–99)
Glucose-Capillary: 196 mg/dL — ABNORMAL HIGH (ref 70–99)
Glucose-Capillary: 151 mg/dL — ABNORMAL HIGH (ref 70–99)

## 2021-07-22 LAB — BASIC METABOLIC PANEL
Anion gap: 10 (ref 5–15)
BUN: 21 mg/dL (ref 8–23)
CO2: 31 mmol/L (ref 22–32)
Calcium: 9.2 mg/dL (ref 8.9–10.3)
Chloride: 101 mmol/L (ref 98–111)
Creatinine, Ser: 1.47 mg/dL — ABNORMAL HIGH (ref 0.44–1.00)
GFR, Estimated: 33 mL/min — ABNORMAL LOW (ref >60)
Glucose, Bld: 131 mg/dL — ABNORMAL HIGH (ref 70–99)
Potassium: 3.9 mmol/L (ref 3.5–5.1)
Sodium: 142 mmol/L (ref 135–145)

## 2021-07-22 MED ORDER — FUROSEMIDE 40 MG PO TABS
40.0000 mg | ORAL_TABLET | Freq: Every day | ORAL | Status: DC
  Administered 2021-07-22 – 2021-07-24 (×3): 40 mg via ORAL
  Filled 2021-07-22 (×3): qty 1

## 2021-07-22 NOTE — Progress Notes (Signed)
Occupational Therapy Treatment Patient Details Name: Renee Carpenter MRN: [id number] DOB: June 27, 1925 Today's Date: 07/22/2021   History of present illness Renee Carpenter is a 88 year old female with a past medical history significant for HFrEF (LVEF 40% 06/2017), hypertension, OSA, type 2 diabetes, CKD 3 who presented to [facility] ED with worsening SOB over the last 3 weeks.  She was reportedly hypoxic to the 80s at her nursing facility and EMS was called.  Chest x-ray revealed enlargement of cardiac silhouette with pulmonary vascular congestion and diffuse pulmonary infiltrates favoring CHF, left pleural effusion with basilar atelectasis. Pt admitted for further management. Of note, pivots to [facility] at baseline for mobility. From [facility] ALF.   OT comments  Renee Carpenter was seen for OT treatment on this date. Upon arrival to room pt awake/alert, semi-supine in bed with caregiver present at bedside. Pt endorses fatigue from recent PT session, but agreeable to bed-level activity. OT/NT facilitated boost up in bed, and pt performs rolling with Min A to supervision level. She is now on 3 L [place] and is maintaining O2 sats in the low 90's with activity. Pt instructed in energy conservation strategies including the 4 P's of energy conservation, routines modifications to support safety and functional independence, and pursed lip breathing technique. Pt /caregiver return verbalized understanding instruction provided. Pt making good progress toward goals. Pt continues to benefit from skilled OT services to maximize return to PLOF and minimize risk of future falls, injury, caregiver burden, and readmission. Will continue to follow POC. Discharge recommendation remains appropriate.     Recommendations for follow up therapy are one component of a multi-disciplinary discharge planning process, led by the attending physician.  Recommendations may be updated based on patient status, additional functional criteria and  insurance authorization.    Follow Up Recommendations  No OT follow up    Assistance Recommended at Discharge Intermittent Supervision/Assistance  Patient can return home with the following  A little help with walking and/or transfers;A little help with bathing/dressing/bathroom;Assistance with cooking/housework;Assist for transportation   Equipment Recommendations  None recommended by OT    Recommendations for Other Services      Precautions / Restrictions Precautions Precautions: Fall Restrictions Weight Bearing Restrictions: No       Mobility Bed Mobility Overal bed mobility: Needs Assistance Bed Mobility: Rolling Rolling: Min assist         General bed mobility comments: Deferred. Pt recently back to bed after PT session, agreeable to bed level activity.    Transfers                         Balance Overall balance assessment: No apparent balance deficits (not formally assessed)                                         ADL either performed or assessed with clinical judgement   ADL Overall ADL's : Needs assistance/impaired                                       General ADL Comments: Pt continues to be functionally limited by cardiopulmonary status, generalized weakness, and decreased activity tolerance. She requires 3 LPM [place] t/o session (not on O2 at baseline) to maintain O2 sats in mid 90's. She is able to perform  Squat pivot t/f to room recliner with baseline assist form PCA. Requires SET-UP Assist for UB grooming while seated in recliner. Anticipate MIN A For LB dressing and bathing, at or near baseline.    Extremity/Trunk Assessment Upper Extremity Assessment Upper Extremity Assessment: Generalized weakness   Lower Extremity Assessment Lower Extremity Assessment: Generalized weakness   Cervical / Trunk Assessment Cervical / Trunk Assessment: Kyphotic    Vision Patient Visual Report: No change from baseline      Perception     Praxis      Cognition Arousal/Alertness: Awake/alert Behavior During Therapy: WFL for tasks assessed/performed Overall Cognitive Status: Within Functional Limits for tasks assessed                                 General Comments: Pleasant, conversational, eager to participate in session.        Exercises Other Exercises Other Exercises: Pt/caregiver educated on energy conservation strategies including PLB, 4 P's of energy conservation, and routines modifications to support safety and functional independence.    Shoulder Instructions       General Comments Pt remains on 3L [place] t/o session. VSS. Min SOB after bed mobility but O2 sats remain in low-mid 90's t/o session. Provided with reinforcement on ECS/PLB technique for energy conservation.    Pertinent Vitals/ Pain       Pain Assessment Pain Assessment: No/denies pain  Home Living                                          Prior Functioning/Environment              Frequency  Min 2X/week        Progress Toward Goals  OT Goals(current goals can now be found in the care plan section)  Progress towards OT goals: Progressing toward goals  Acute Rehab OT Goals Patient Stated Goal: To go back to ALF OT Goal Formulation: With patient Time For Goal Achievement: 08/04/21 Potential to Achieve Goals: Good  Plan Discharge plan remains appropriate;Frequency remains appropriate    Co-evaluation                 AM-PAC OT "6 Clicks" Daily Activity     Outcome Measure   Help from another person eating meals?: None Help from another person taking care of personal grooming?: None Help from another person toileting, which includes using toliet, bedpan, or urinal?: A Lot Help from another person bathing (including washing, rinsing, drying)?: A Little Help from another person to put on and taking off regular upper body clothing?: A Little Help from another person to  put on and taking off regular lower body clothing?: A Little 6 Click Score: 19    End of Session Equipment Utilized During Treatment: Oxygen  OT Visit Diagnosis: Other abnormalities of gait and mobility (R26.89);Muscle weakness (generalized) (M62.81)   Activity Tolerance Patient tolerated treatment well   Patient Left with family/visitor present;in bed;with bed alarm set;with call bell/phone within reach   Nurse Communication          Time: 8270-7867 OT Time Calculation (min): 31 min  Charges: OT General Charges $OT Visit: 1 Visit OT Treatments $Self Care/Home Management : 23-37 mins  Rockney Ghee, M.S., OTR/L [employer] Team - [facility] Ascom: [phone] 07/22/21, 1:25 PM

## 2021-07-22 NOTE — Evaluation (Signed)
Physical Therapy Evaluation Patient Details Name: Renee Carpenter MRN: [id number] DOB: [birth year] Today's Date: 07/22/2021  History of Present Illness  Renee Carpenter is a 88 year old female with a past medical history significant for HFrEF (LVEF 40% 06/2017), hypertension, OSA, type 2 diabetes, CKD 3 who presented to [facility] ED with worsening SOB over the last 3 weeks.  She was reportedly hypoxic to the 80s at her nursing facility and EMS was called.  Chest x-ray revealed enlargement of cardiac silhouette with pulmonary vascular congestion and diffuse pulmonary infiltrates favoring CHF, left pleural effusion with basilar atelectasis. Pt admitted for further management. Of note, pivots to [facility] at baseline for mobility. From [facility].  Clinical Impression  Pt did well with mobility and showed ability to transfer 90* and 180* without physical assist.  She displayed good proximal LE strength with progressive weakness more distally.  She is not far from her baseline apart from O2 requirement.  She does not use O2 at baseline but needed 3.5L to stay near 90% today.  Transfer and seated activity on room air with drop into the 70s, back to ~90 relatively quickly back on 3.5L but did not seem to be >92% t/o the entire session, only c/o minimal fatigue.  Pt has worked with PT in the past at her facility and I would recommend this when she is ready for d/c.    Recommendations for follow up therapy are one component of a multi-disciplinary discharge planning process, led by the attending physician.  Recommendations may be updated based on patient status, additional functional criteria and insurance authorization.  Follow Up Recommendations Home health PT (in the setting of return to her facility)    Assistance Recommended at Discharge Frequent or constant Supervision/Assistance  Patient can return home with the following       Equipment Recommendations None recommended by PT  Recommendations for  Other Services       Functional Status Assessment Patient has not had a recent decline in their functional status     Precautions / Restrictions Precautions Precautions: Fall Restrictions Weight Bearing Restrictions: No      Mobility  Bed Mobility Overal bed mobility: Needs Assistance Bed Mobility: Supine to Sit     Supine to sit: Supervision     General bed mobility comments: Pt was able to rise to long sitting (and don socks) and then rotate to sitting EOB all w/o assist, minimal cuing    Transfers Overall transfer level: Needs assistance Equipment used: 1 person hand held assist Transfers: Bed to chair/wheelchair/BSC   Stand pivot transfers: Min guard         General transfer comment: Pt able to do both a 90* and 180* stand pivot.  She did not need direct assist but, per her baseline, she requested to have HHA but did not need other physical assist    Ambulation/Gait               General Gait Details: does not ambulate at baseline  Stairs            Wheelchair Mobility    Modified Rankin (Stroke Patients Only)       Balance Overall balance assessment: Needs assistance Sitting-balance support: No upper extremity supported Sitting balance-Leahy Scale: Good     Standing balance support: Bilateral upper extremity supported Standing balance-Leahy Scale: Fair Standing balance comment: unable to fully extend knees in standing/WBing but with b/l UE use showed good balance/safety in standing  Pertinent Vitals/Pain Pain Assessment Pain Assessment: No/denies pain Pain Location: occasional transitent neuropathy related distal LE pain that is chronic L>R    Home Living Family/patient expects to be discharged to:: Assisted living   Available Help at Discharge: Personal care attendant (PCA 4d/wk, other help (family, etc) most of the rest of the time) Type of Home: Apartment Home Access: Level entry        Home Layout: One level Home Equipment: Wheelchair - manual;Electric scooter;Grab bars - tub/shower;Grab bars - toilet;Hand held shower head;Tub bench;Transport chair;Rollator (4 wheels)      Prior Function Prior Level of Function : Needs assist       Physical Assist : Mobility (physical);ADLs (physical) Mobility (physical): Transfers ADLs (physical): Bathing;Dressing;IADLs;Toileting Mobility Comments: Pt squat pivots to WC at baseline. Generally does not require assistance. PCA's provide supervision for safety, unless pt is having a bad day.       Hand Dominance        Extremity/Trunk Assessment   Upper Extremity Assessment Upper Extremity Assessment: Generalized weakness    Lower Extremity Assessment Lower Extremity Assessment: Generalized weakness    Cervical / Trunk Assessment Cervical / Trunk Assessment: Kyphotic  Communication   Communication: No difficulties  Cognition Arousal/Alertness: Awake/alert Behavior During Therapy: WFL for tasks assessed/performed Overall Cognitive Status: Within Functional Limits for tasks assessed                                          General Comments General comments (skin integrity, edema, etc.): Pt remains on 3L [place] t/o session. VSS. Min SOB after bed mobility but O2 sats remain in low-mid 90's t/o session. Provided with reinforcement on ECS/PLB technique for energy conservation.    Exercises General Exercises - Lower Extremity Ankle Circles/Pumps: PROM (pt with essentially no ankle AROM) Long Arc Quad: AROM, 10 reps, Strengthening (focus on controlled quality of movement) Heel Slides: AROM, Strengthening, 10 reps Hip ABduction/ADduction: Strengthening, 5 reps (pt with good general hip strength/control)   Assessment/Plan    PT Assessment Patient needs continued PT services  PT Problem List Decreased strength;Decreased range of motion;Decreased activity tolerance;Decreased balance;Decreased mobility;Decreased  knowledge of use of DME;Decreased safety awareness       PT Treatment Interventions DME instruction;Gait training;Functional mobility training;Balance training;Therapeutic activities;Therapeutic exercise;Neuromuscular re-education;Patient/family education    PT Goals (Current goals can be found in the Care Plan section)  Acute Rehab PT Goals Patient Stated Goal: Go home PT Goal Formulation: With patient Time For Goal Achievement: 08/05/21 Potential to Achieve Goals: Good    Frequency Min 2X/week     Co-evaluation               AM-PAC PT "6 Clicks" Mobility  Outcome Measure Help needed turning from your back to your side while in a flat bed without using bedrails?: None Help needed moving from lying on your back to sitting on the side of a flat bed without using bedrails?: None Help needed moving to and from a bed to a chair (including a wheelchair)?: A Little Help needed standing up from a chair using your arms (e.g., wheelchair or bedside chair)?: A Little Help needed to walk in hospital room?: Total Help needed climbing 3-5 steps with a railing? : Total 6 Click Score: 16    End of Session Equipment Utilized During Treatment: Gait belt Activity Tolerance: Patient tolerated treatment well Patient left: in chair (on  BSC, call bell and caregiver close and know to call when done) Nurse Communication: Mobility status PT Visit Diagnosis: Muscle weakness (generalized) (M62.81);Difficulty in walking, not elsewhere classified (R26.2);Unsteadiness on feet (R26.81)    Time: 0915-1000 PT Time Calculation (min) (ACUTE ONLY): 45 min   Charges:   PT Evaluation $PT Eval Low Complexity: 1 Low PT Treatments $Therapeutic Activity: 8-22 mins        Malachi Pro, DPT 07/22/2021, 1:28 PM

## 2021-07-22 NOTE — Progress Notes (Signed)
Patient was very confused pulling at lines, removing clothes and taking off her oxygen.  Respiratory and myself decided not to put the c-pap on the patient due to agitation and anxiety.

## 2021-07-22 NOTE — Plan of Care (Signed)

## 2021-07-22 NOTE — Progress Notes (Signed)
[facility] NOTE       Patient ID: Renee Carpenter MRN: [id number] DOB/AGE: 12-09-1925 88 y.o.  Admit date: 07/20/2021 Referring Physician Dr. Shawna Clamp Primary Physician Dr. Miguel Aschoff Primary Cardiologist Dr. Nehemiah Massed (last seen in 2019)  Reason for Consultation CHF  HPI: The patient is a 88 year old female with a past medical history significant for HFrEF (07/21/2021 LVEF 25-30% with global hypokinesis, G1 DD, mod to severe MR decrased from 40% 06/2017), hypertension, OSA, type 2 diabetes, CKD 3 who presented to [facility] ED with worsening shortness of breath than her baseline.  She was reportedly hypoxic to the 80s at her nursing facility and EMS was called.  Cardiology is consulted for further assistance with her CHF.  Interval history: -feels "restless" but somewhat improved. O2 weaned to 3L this am and worked with PT.  -Diuresing very well with IV Lasix, net -3.2 L over the past 24 hours   Review of systems complete and found to be negative unless listed above   Past Medical History:  Diagnosis Date   Anemia    WAS ANEMIC   Arthritis    Blood transfusion    HAD 1 SEVERAL YRS AGO...3-4 YR   Chronic kidney disease    SOME DECREASE IN KIDNEY FUNCTION   Diabetes mellitus without complication (HCC)    Hypertension    Normal cardiac stress test    REQUESTING COPY FROM [facility]   Normal echocardiogram    REQUESTING THAT RESULT -- DR KAHN    PONV (postoperative nausea and vomiting)    Shortness of breath    Spondylolysis 1998    Past Surgical History:  Procedure Laterality Date   ABDOMINAL HYSTERECTOMY     BACK SURGERY     2 CERVICAL SURGERIES ALSO   CATARACTS     RIGHT EYE CATARACT   CERVICAL DISCECTOMY  08/1995 and 2011   C4-,C5-6, C6-7 anterior cervical; Dr. Ellene Route fusion and plating   LAMINECTOMY     decompressive; Dr. Burman Riis in [place] MICRODISCECTOMY  04/14/2011   Procedure: LUMBAR  LAMINECTOMY/DECOMPRESSION MICRODISCECTOMY;  Surgeon: [name];  Location: [place] [facility];  Service: Neurosurgery;  Laterality: N/A;  Lumbar Three and Lumbar Four Laminectomy   TONSILLECTOMY AND ADENOIDECTOMY  1939    Medications Prior to Admission  Medication Sig Dispense Refill Last Dose   carbidopa-levodopa (SINEMET IR) 25-100 MG tablet Take 1 tablet by mouth 3 (three) times daily. Take 1 tablet three times a day   07/20/2021 at 0800   Coenzyme Q10 (CO Q10) 200 MG CAPS Take 1 capsule by mouth daily.   07/20/2021 at 0800   DULoxetine (CYMBALTA) 20 MG capsule Take 40 mg by mouth daily.   07/20/2021 at 0800   esomeprazole (NEXIUM) 40 MG capsule TAKE 1 CAPSULE BY MOUTH EVERY DAY (Patient taking differently: Take 40 mg by mouth daily. TAKE 1 CAPSULE BY MOUTH EVERY DAY) 90 capsule 1 07/20/2021 at 0800   gabapentin (NEURONTIN) 600 MG tablet TAKE 1 TABLET(600 MG) BY MOUTH THREE TIMES DAILY (Patient taking differently: Take 600 mg by mouth 3 (three) times daily.) 270 tablet 3 07/20/2021 at 0800   glimepiride (AMARYL) 4 MG tablet TAKE 1 TABLET(4 MG) BY MOUTH DAILY BEFORE BREAKFAST 90 tablet 1 07/20/2021 at 0800   MAGNESIUM-OXIDE 400 (241.3 Mg) MG tablet TAKE 1 TABLET BY MOUTH TWICE DAILY 60 tablet 11 07/20/2021 at 0800   MULTIPLE VITAMIN PO Take 1 tablet by mouth daily.   07/20/2021  0800   Probiotic Product (ALIGN PO) Take 1 capsule by mouth daily.   07/20/2021 at 0800   rOPINIRole (REQUIP) 0.5 MG tablet Take 0.5 mg by mouth 3 (three) times daily.   07/20/2021 at 0800   tamsulosin (FLOMAX) 0.4 MG CAPS capsule TAKE 1 CAPSULE(0.4 MG) BY MOUTH DAILY 90 capsule 3 07/20/2021 at 0800   Accu-Chek FastClix Lancets MISC USE TO TEST BLOOD SUGAR UP TO 4 TIMES DAILY AS DIRECTED 102 each 1    ACCU-CHEK GUIDE test strip USE TO TEST BLOOD SUGAR UP TO 4 X DAILY 100 strip 1    acetaminophen (TYLENOL) 500 MG tablet Take 500 mg by mouth every 6 (six) hours as needed. (Patient not taking: Reported on 07/20/2021)   Not Taking at  unknown   albuterol (VENTOLIN HFA) 108 (90 Base) MCG/ACT inhaler INHALE 2 PUFFS INTO THE LUNGS EVERY 4 HOURS AS NEEDED FOR WHEEZING OR SHORTNESS OF BREATH 6.7 g 3 prn at unknown   ALPHA LIPOIC ACID PO Take by mouth daily. (Patient not taking: Reported on 07/20/2021)   Not Taking   blood glucose meter kit and supplies KIT Dispense based on patient and insurance preference. Use up to four times daily as directed. (FOR ICD-9 250.00, 250.01). 1 each 0    Blood Glucose Monitoring Suppl (ACCU-CHEK GUIDE) w/Device KIT Check blood sugars daily as directed 1 kit 0    DULoxetine (CYMBALTA) 30 MG capsule Take 1 capsule (30 mg total) by mouth daily. (Patient not taking: Reported on 07/20/2021) 30 capsule 2 Not Taking   hydroxypropyl methylcellulose (ISOPTO TEARS) 2.5 % ophthalmic solution Place 1 drop into both eyes 3 (three) times daily as needed. Dry eyes    prn at unknown   senna-docusate (SENOKOT-S) 8.6-50 MG tablet Take 1 tablet by mouth daily as needed.    prn at unknown    Social History   Socioeconomic History   Marital status: Widowed    Spouse name: Not on file   Number of children: 1   Years of education: Not on file   Highest education level: Bachelor's degree (e.g., BA, AB, BS)  Occupational History   Occupation: retired  Tobacco Use   Smoking status: Never   Smokeless tobacco: Never  Substance and Sexual Activity   Alcohol use: No   Drug use: No   Sexual activity: Never  Other Topics Concern   Not on file  Social History Narrative   Not on file   Social Determinants of Health   Financial Resource Strain: Not on file  Food Insecurity: Not on file  Transportation Needs: Not on file  Physical Activity: Not on file  Stress: Not on file  Social Connections: Not on file  Intimate Partner Violence: Not on file    Family History  Problem Relation Age of Onset   Heart failure Father    Heart disease Father    Hypertension Mother    Diabetes Mother    Heart disease Mother     Cancer Sister        breast   Breast cancer Sister 28   Cancer Maternal Grandfather    Cancer Maternal Aunt        x3   Breast cancer Daughter 55      Review of systems complete and found to be negative unless listed above    PHYSICAL EXAM General: Pleasant elderly Caucasian female, well nourished, in no acute distress.  Sitting upright in PCU bed with caretaker at bedside. HEENT:  Normocephalic  Normocephalic and atraumatic.  Erythema around left eyelid. Neck:  No JVD.  Lungs: Normal work of breathing on 3L O2 by [place]. Bibasilar crackles.   Heart: HRRR . Normal S1 and S2 without gallops or murmurs. Radial & DP pulses 2+ bilaterally. Abdomen: Soft, nontender, mildly-distended.  Msk: Normal strength and tone for age. Extremities: Warm and well perfused. No clubbing, cyanosis.  Trace peripheral edema, much improved.   Neuro: Alert and oriented X 3. Psych:  Answers questions appropriately.   Labs:   Lab Results  Component Value Date   WBC 5.6 07/21/2021   HGB 13.4 07/21/2021   HCT 40.0 07/21/2021   MCV 119.0 (H) 07/21/2021   PLT 197 07/21/2021    Recent Labs  Lab 07/21/21 0459 07/22/21 0605  NA 142 142  K 4.1 3.9  CL 105 101  CO2 29 31  BUN 18 21  CREATININE 1.26* 1.47*  CALCIUM 8.8* 9.2  PROT 6.2*  --   BILITOT 1.4*  --   ALKPHOS 61  --   ALT 8  --   AST 27  --   GLUCOSE 105* 131*    No results found for: CKTOTAL, CKMB, CKMBINDEX, TROPONINI  Lab Results  Component Value Date   CHOL 149 08/26/2017   CHOL 142 02/27/2016   CHOL 149 01/15/2015   Lab Results  Component Value Date   HDL 47 08/26/2017   HDL 64 02/27/2016   HDL 51 01/15/2015   Lab Results  Component Value Date   LDLCALC 74 08/26/2017   LDLCALC 54 02/27/2016   LDLCALC 67 01/15/2015   Lab Results  Component Value Date   TRIG 141 08/26/2017   TRIG 120 02/27/2016   TRIG 155 (H) 01/15/2015   Lab Results  Component Value Date   CHOLHDL 3.2 08/26/2017   No results found for: LDLDIRECT    Radiology:  DG Chest Portable 1 View  Result Date: 07/20/2021 CLINICAL DATA:  Shortness of breath EXAM: PORTABLE CHEST 1 VIEW COMPARISON:  Portable exam 1201 hours compared to 09/10/2020 Correlation: Cervical spine radiographs 01/24/2019 FINDINGS: Enlargement of cardiac silhouette with pulmonary vascular congestion. Atherosclerotic calcification aorta. BILATERAL perihilar to basilar infiltrates favor pulmonary edema and CHF. Small LEFT pleural effusion and LEFT basilar atelectasis. No pneumothorax. Prior cervical spine fusion with chronic fracture of the cervical plate. IMPRESSION: Enlargement of cardiac silhouette with pulmonary vascular congestion and diffuse pulmonary infiltrates favoring CHF. LEFT pleural effusion and basilar atelectasis. Aortic Atherosclerosis (ICD10-I70.0). Electronically Signed   By: Lavonia Dana M.D.   On: 07/20/2021 12:17   ECHOCARDIOGRAM COMPLETE  Result Date: 07/21/2021    ECHOCARDIOGRAM REPORT   Patient Name:   Renee Carpenter Date of Exam: 07/21/2021 Medical Rec #:  [id number]          Height:       65.0 in Accession #:    [id number]         Weight:       171.3 lb Date of Birth:  1925/07/05          BSA:          1.852 m Patient Age:    27 years           BP:           129/84 mmHg Patient Gender: F                  HR:           84 bpm. Exam Location:  Procedure: 2D Echo, Color Doppler and Cardiac Doppler Indications:     I50.31 congestive heart failure-Acute Diastolic  History:         Patient has no prior history of Echocardiogram examinations.                  CKD; Risk Factors:Hypertension and Diabetes.  Sonographer:     Charmayne Sheer Referring Phys:  [place] Diagnosing Phys: Serafina Royals MD  Sonographer Comments: Suboptimal subcostal window. IMPRESSIONS  1. Left ventricular ejection fraction, by estimation, is 25 to 30%. The left ventricle has severely decreased function. The left ventricle demonstrates global hypokinesis. The left ventricular internal cavity size was  mildly to moderately dilated. Left ventricular diastolic parameters are consistent with Grade I diastolic dysfunction (impaired relaxation).  2. Right ventricular systolic function is normal. The right ventricular size is normal.  3. Left atrial size was moderately dilated.  4. Right atrial size was moderately dilated.  5. The mitral valve is normal in structure. Moderate to severe mitral valve regurgitation.  6. Tricuspid valve regurgitation is mild to moderate.  7. The aortic valve is normal in structure. Aortic valve regurgitation is mild. FINDINGS  Left Ventricle: Left ventricular ejection fraction, by estimation, is 25 to 30%. The left ventricle has severely decreased function. The left ventricle demonstrates global hypokinesis. The left ventricular internal cavity size was mildly to moderately dilated. There is no left ventricular hypertrophy. Left ventricular diastolic parameters are consistent with Grade I diastolic dysfunction (impaired relaxation). Right Ventricle: The right ventricular size is normal. No increase in right ventricular wall thickness. Right ventricular systolic function is normal. Left Atrium: Left atrial size was moderately dilated. Right Atrium: Right atrial size was moderately dilated. Pericardium: There is no evidence of pericardial effusion. Mitral Valve: The mitral valve is normal in structure. Moderate to severe mitral valve regurgitation. MV peak gradient, 10.4 mmHg. The mean mitral valve gradient is 5.0 mmHg. Tricuspid Valve: The tricuspid valve is normal in structure. Tricuspid valve regurgitation is mild to moderate. Aortic Valve: The aortic valve is normal in structure. Aortic valve regurgitation is mild. Aortic valve mean gradient measures 4.0 mmHg. Aortic valve peak gradient measures 7.7 mmHg. Aortic valve area, by VTI measures 1.64 cm. Pulmonic Valve: The pulmonic valve was normal in structure. Pulmonic valve regurgitation is not visualized. Aorta: The aortic root and  ascending aorta are structurally normal, with no evidence of dilitation. IAS/Shunts: No atrial level shunt detected by color flow Doppler.  LEFT VENTRICLE PLAX 2D LVIDd:         5.49 cm   Diastology LVIDs:         4.66 cm   LV e' medial:    3.48 cm/s LV PW:         0.98 cm   LV E/e' medial:  34.8 LV IVS:        0.72 cm   LV e' lateral:   4.24 cm/s LVOT diam:     1.80 cm   LV E/e' lateral: 28.5 LV SV:         41 LV SV Index:   22 LVOT Area:     2.54 cm  RIGHT VENTRICLE RV Basal diam:  2.90 cm TAPSE (M-mode): 2.4 cm LEFT ATRIUM             Index        RIGHT ATRIUM           Index LA diam:  4.30 cm 2.32 cm/m   RA Area:     13.80 cm LA Vol (A2C):   63.8 ml 34.45 ml/m  RA Volume:   33.70 ml  18.20 ml/m LA Vol (A4C):   76.4 ml 41.25 ml/m LA Biplane Vol: 73.0 ml 39.41 ml/m  AORTIC VALVE                    PULMONIC VALVE AV Area (Vmax):    1.60 cm     PV Vmax:       0.67 m/s AV Area (Vmean):   1.70 cm     PV Vmean:      48.900 cm/s AV Area (VTI):     1.64 cm     PV VTI:        0.110 m AV Vmax:           139.00 cm/s  PV Peak grad:  1.8 mmHg AV Vmean:          90.400 cm/s  PV Mean grad:  1.0 mmHg AV VTI:            0.248 m AV Peak Grad:      7.7 mmHg AV Mean Grad:      4.0 mmHg LVOT Vmax:         87.50 cm/s LVOT Vmean:        60.300 cm/s LVOT VTI:          0.160 m LVOT/AV VTI ratio: 0.65  AORTA Ao Root diam: 3.00 cm MITRAL VALVE MV Area (PHT): 8.34 cm     SHUNTS MV Area VTI:   1.37 cm     Systemic VTI:  0.16 m MV Peak grad:  10.4 mmHg    Systemic Diam: 1.80 cm MV Mean grad:  5.0 mmHg MV Vmax:       1.61 m/s MV Vmean:      107.0 cm/s MV Decel Time: 91 msec MV E velocity: 121.00 cm/s MV A velocity: 145.00 cm/s MV E/A ratio:  0.83 Serafina Royals MD Electronically signed by Serafina Royals MD Signature Date/Time: 07/21/2021/12:29:02 PM    Final     ECHO 07/21/2021 LVEF 25-30% with global hypokinesis.,  G1 DD, mod to severe MR  06/2017 INTERPRETATION  MODERATE LV SYSTOLIC DYSFUNCTION (See above)   WITH MILD  LVH  NORMAL RIGHT VENTRICULAR SYSTOLIC FUNCTION  MODERATE VALVULAR REGURGITATION (See above)  NO VALVULAR STENOSIS  MILD to MODERATE MR  MODERATE TR  MILD AR, PR  MILD PHTN  EF 40%   TELEMETRY reviewed by me: Sinus rhythm rate 85  EKG reviewed by me: NSR 90, LBBB similar to 09/2020  ASSESSMENT AND PLAN:  The patient is a 88 year old female with a past medical history significant for HFrEF (07/21/2021 LVEF 25-30% with global hypokinesis, G1 DD, mod to severe MR decrased from 40% 06/2017), hypertension, OSA, type 2 diabetes, CKD 3 who presented to [facility] ED with worsening shortness of breath than her baseline.  She was reportedly hypoxic to the 80s at her nursing facility and EMS was called.  Cardiology is consulted for further assistance with her CHF.  #Acute on chronic HFrEF (07/21/2021 LVEF 25-30% with global hypokinesis, G1 DD, mod to severe MR decrased from 40% 06/2017) Patient presents with 3 weeks of worsening shortness of breath, and reported hypoxia with O2 sat in the low 80s to 90s per her caregiver.  She apparently loves to eat ice but cannot quantify the specific amount she intakes per day, and does  not watch her salt. Appears to be volume overloaded on presentation, requiring 4-5 L of supplemental oxygen.  BNP elevated to 1845, left pleural effusion by chest x-ray. -S/p 40 mg x 3 doses with ~3.2L output recorded since admission. Cr bumped overnight, so will transition to PO lasix 74m daily today.  -Still has bibasilar crackles and a supplemental O2 requirement, repeat CXR this morning with small bilateral pleural effusions.  -Hold home losartan due to CKD.   -continue low dose metoprolol .  -echocardiogram resulted with reduced EF 25-30% as above - will continue GDMT as above. -Strict monitoring of I's and O's while inpatient, daily weights -appreciate HF nurse education   #Hypertension Blood pressure better controlled. holding losartan due to kidney function. -added metoprolol as  above.   #CKD 3 We will DC losartan.  Other plan as above. Renal function slightly bumped overnight - trending creatinine 1.3-1.26-1.46, GFR 36-40-33  #OSA -Recommend CPAP/BiPAP nightly while inpatient.  This patient's plan of care was discussed and created with Dr. KNehemiah Massedand he is in agreement.  Signed: LTristan Schroeder, PA-C 07/22/2021, 8:51 AM [facility]

## 2021-07-22 NOTE — Progress Notes (Signed)
°  Progress Note   Patient: Renee Carpenter [id number] DOB: [birth year] DOA: 07/20/2021     2 DOS: the patient was seen and examined on 07/22/2021       Brief hospital course: Mrs. Fillingim is a 88 y.o. F with sCHF EF 40-45%, well controlled, spinal stenosis, DM, Parkinson's disease, HTN and OSA who presented from home with hypoxia after noting 3 weeks progressive SOB, worsening orthopnea and leg swelling.  In the ER, SpO2 85%, tachypneic.  Needed up to 5L O2 overnight.  CXR showed bilateral infiltrates consistent with edema, BNP ~2000.  Started on IV Lasix and admitted for CHF.    Assessment and Plan: * Acute on chronic combined systolic and diastolic CHF (congestive heart failure) (HCC) Echo here with EF down to 25-30%, grade I DD, no signficant valvular disease. --Still on O2, but feels symptoms are better. --s/p furosemide 40 mg IV twice a day  Plan: --transition to oral lasix 40 mg daily today by cardio --cont Toprol        Parkinson's disease (HCC) - Continue Sinemet, ropinirole  Type 2 diabetes mellitus with chronic kidney disease, without long-term current use of insulin (HCC) A1c 7.7 - Hold glimepiride - Sliding scale correction  Acute respiratory failure with hypoxia (HCC) Hypoxic and in respiratory distress at admission, needing 5 to 6 L O2.  Due to CHF  --O2 requirement down to 3L today --Continue supplemental O2 to keep sats between 88-92%, wean as tolerated   OSA (obstructive sleep apnea)- (present on admission) - CPAP at night  Stage 3b chronic kidney disease (CKD) ([place])- (present on admission) baseline 1.2-1.6, currently within range.  Anxiety, generalized- (present on admission) - Continue Cymbalta  COPD, mild ([place])- (present on admission) No wheezing - Albuterol as needed  Essential hypertension- (present on admission) Blood pressure controlled --cont metop --cont lasix as oral 40 mg daily today        Subjective:  Dyspnea  improved.  Having intermittent coughing spells related to aspirating, which according to pt and caregiver is chronic.   Physical Exam: Vitals:   07/22/21 0432 07/22/21 0742 07/22/21 1130 07/22/21 1636  BP: (!) 114/54 133/74 123/64 122/84  Pulse: 80 85 85 86  Resp: 19 20 20 18   Temp:  (!) 97.5 F (36.4 C) 97.7 F (36.5 C) 98.1 F (36.7 C)  TempSrc:      SpO2: 93% 97% 91% (!) 87%  Weight: 77.2 kg     Height:       Constitutional: NAD, AAOx3, sitting at edge of bed eating HEENT: conjunctivae and lids normal, EOMI CV: No cyanosis.   RESP: normal respiratory effort, mild crackles over mid posterior lung fields, on 3L Extremities: No effusions, edema in BLE SKIN: warm, dry Neuro: II - XII grossly intact.   Psych: Normal mood and affect.  Appropriate judgement and reason    Author: Enzo Bi, MD 07/22/2021 5:42 PM  For on call review [email or web address].

## 2021-07-23 LAB — BASIC METABOLIC PANEL
Anion gap: 9 (ref 5–15)
BUN: 25 mg/dL — ABNORMAL HIGH (ref 8–23)
CO2: 32 mmol/L (ref 22–32)
Calcium: 9.2 mg/dL (ref 8.9–10.3)
Chloride: 98 mmol/L (ref 98–111)
Creatinine, Ser: 1.45 mg/dL — ABNORMAL HIGH (ref 0.44–1.00)
GFR, Estimated: 34 mL/min — ABNORMAL LOW (ref >60)
Glucose, Bld: 133 mg/dL — ABNORMAL HIGH (ref 70–99)
Potassium: 3.6 mmol/L (ref 3.5–5.1)
Sodium: 139 mmol/L (ref 135–145)

## 2021-07-23 LAB — GLUCOSE, CAPILLARY
Glucose-Capillary: 126 mg/dL — ABNORMAL HIGH (ref 70–99)
Glucose-Capillary: 171 mg/dL — ABNORMAL HIGH (ref 70–99)
Glucose-Capillary: 220 mg/dL — ABNORMAL HIGH (ref 70–99)
Glucose-Capillary: 191 mg/dL — ABNORMAL HIGH (ref 70–99)

## 2021-07-23 LAB — CBC
HCT: 37.7 % (ref 36.0–46.0)
Hemoglobin: 12.9 g/dL (ref 12.0–15.0)
MCH: 39.9 pg — ABNORMAL HIGH (ref 26.0–34.0)
MCHC: 34.2 g/dL (ref 30.0–36.0)
MCV: 116.7 fL — ABNORMAL HIGH (ref 80.0–100.0)
Platelets: 233 10*3/uL (ref 150–400)
RBC: 3.23 MIL/uL — ABNORMAL LOW (ref 3.87–5.11)
RDW: 16.8 % — ABNORMAL HIGH (ref 11.5–15.5)
WBC: 6.4 10*3/uL (ref 4.0–10.5)
nRBC: 0 % (ref 0.0–0.2)

## 2021-07-23 LAB — MAGNESIUM: Magnesium: 1.8 mg/dL (ref 1.7–2.4)

## 2021-07-23 MED ORDER — ROPINIROLE HCL 1 MG PO TABS
0.5000 mg | ORAL_TABLET | Freq: Three times a day (TID) | ORAL | Status: DC
Start: 2021-07-23 — End: 2021-07-24
  Administered 2021-07-23 – 2021-07-24 (×2): 0.5 mg via ORAL
  Filled 2021-07-23 (×2): qty 1

## 2021-07-23 MED ORDER — GABAPENTIN 600 MG PO TABS
300.0000 mg | ORAL_TABLET | Freq: Three times a day (TID) | ORAL | Status: DC
Start: 2021-07-23 — End: 2021-07-24
  Administered 2021-07-23 – 2021-07-24 (×2): 300 mg via ORAL
  Filled 2021-07-23 (×2): qty 1

## 2021-07-23 MED ORDER — CARBIDOPA-LEVODOPA 25-100 MG PO TABS
1.0000 | ORAL_TABLET | Freq: Three times a day (TID) | ORAL | Status: DC
  Administered 2021-07-23 – 2021-07-24 (×2): 1 via ORAL
  Filled 2021-07-23 (×2): qty 1

## 2021-07-23 NOTE — Progress Notes (Signed)
[place] NOTE       Patient ID: Renee Carpenter MRN: [id number] DOB/AGE: [birth year] 88 y.o.  Admit date: 07/20/2021 Referring Physician Dr. Shawna Clamp Primary Physician Dr. Miguel Aschoff Primary Cardiologist Dr. Nehemiah Massed (last seen in 2019)  Reason for Consultation CHF  HPI: The patient is a 88 year old female with a past medical history significant for HFrEF (07/21/2021 LVEF 25-30% with global hypokinesis, G1 DD, mod to severe MR decrased from 40% 06/2017), hypertension, OSA, type 2 diabetes, CKD 3 who presented to [facility] ED with worsening shortness of breath than her baseline.  She was reportedly hypoxic to the 80s at her nursing facility and EMS was called.  Cardiology is consulted for further assistance with her CHF.  Interval history: -Patient has had improvements of symptoms from admission and feels much better at this time but still requiring minimal oxygen supplementation.  Patient does have 2627 out urine for improvements with continued stabilization of glomerular filtration rate of 34.  There has been no evidence of chest pain or other significant concerns at this time.  She has had transition of furosemide to oral furosemide at this time  Review of systems complete and found to be negative unless listed above   Past Medical History:  Diagnosis Date   Anemia    WAS ANEMIC   Arthritis    Blood transfusion    HAD 1 SEVERAL YRS AGO...3-4 YR   Chronic kidney disease    SOME DECREASE IN KIDNEY FUNCTION   Diabetes mellitus without complication (HCC)    Hypertension    Normal cardiac stress test    REQUESTING COPY FROM [facility]   Normal echocardiogram    REQUESTING THAT RESULT -- DR KAHN    PONV (postoperative nausea and vomiting)    Shortness of breath    Spondylolysis 1998    Past Surgical History:  Procedure Laterality Date   ABDOMINAL HYSTERECTOMY     BACK SURGERY     2 CERVICAL SURGERIES ALSO   CATARACTS     RIGHT EYE  CATARACT   CERVICAL DISCECTOMY  08/1995 and 2011   C4-,C5-6, C6-7 anterior cervical; Dr. Ellene Route fusion and plating   LAMINECTOMY     decompressive; Dr. Burman Riis in [place] MICRODISCECTOMY  04/14/2011   Procedure: LUMBAR LAMINECTOMY/DECOMPRESSION MICRODISCECTOMY;  Surgeon: [name];  Location: [place] [facility];  Service: Neurosurgery;  Laterality: N/A;  Lumbar Three and Lumbar Four Laminectomy   TONSILLECTOMY AND ADENOIDECTOMY  1939    Medications Prior to Admission  Medication Sig Dispense Refill Last Dose   carbidopa-levodopa (SINEMET IR) 25-100 MG tablet Take 1 tablet by mouth 3 (three) times daily. Take 1 tablet three times a day   07/20/2021 at 0800   Coenzyme Q10 (CO Q10) 200 MG CAPS Take 1 capsule by mouth daily.   07/20/2021 at 0800   DULoxetine (CYMBALTA) 20 MG capsule Take 40 mg by mouth daily.   07/20/2021 at 0800   esomeprazole (NEXIUM) 40 MG capsule TAKE 1 CAPSULE BY MOUTH EVERY DAY (Patient taking differently: Take 40 mg by mouth daily. TAKE 1 CAPSULE BY MOUTH EVERY DAY) 90 capsule 1 07/20/2021 at 0800   gabapentin (NEURONTIN) 600 MG tablet TAKE 1 TABLET(600 MG) BY MOUTH THREE TIMES DAILY (Patient taking differently: Take 600 mg by mouth 3 (three) times daily.) 270 tablet 3 07/20/2021 at 0800   glimepiride (AMARYL) 4 MG tablet TAKE 1 TABLET(4 MG) BY MOUTH DAILY BEFORE BREAKFAST 90 tablet 1 07/20/2021  at 0800   MAGNESIUM-OXIDE 400 (241.3 Mg) MG tablet TAKE 1 TABLET BY MOUTH TWICE DAILY 60 tablet 11 07/20/2021 at 0800   MULTIPLE VITAMIN PO Take 1 tablet by mouth daily.   07/20/2021 at 0800   Probiotic Product (ALIGN PO) Take 1 capsule by mouth daily.   07/20/2021 at 0800   rOPINIRole (REQUIP) 0.5 MG tablet Take 0.5 mg by mouth 3 (three) times daily.   07/20/2021 at 0800   tamsulosin (FLOMAX) 0.4 MG CAPS capsule TAKE 1 CAPSULE(0.4 MG) BY MOUTH DAILY 90 capsule 3 07/20/2021 at 0800   Accu-Chek FastClix Lancets MISC USE TO TEST BLOOD SUGAR UP TO 4 TIMES DAILY AS  DIRECTED 102 each 1    ACCU-CHEK GUIDE test strip USE TO TEST BLOOD SUGAR UP TO 4 X DAILY 100 strip 1    acetaminophen (TYLENOL) 500 MG tablet Take 500 mg by mouth every 6 (six) hours as needed. (Patient not taking: Reported on 07/20/2021)   Not Taking at unknown   albuterol (VENTOLIN HFA) 108 (90 Base) MCG/ACT inhaler INHALE 2 PUFFS INTO THE LUNGS EVERY 4 HOURS AS NEEDED FOR WHEEZING OR SHORTNESS OF BREATH 6.7 g 3 prn at unknown   ALPHA LIPOIC ACID PO Take by mouth daily. (Patient not taking: Reported on 07/20/2021)   Not Taking   blood glucose meter kit and supplies KIT Dispense based on patient and insurance preference. Use up to four times daily as directed. (FOR ICD-9 250.00, 250.01). 1 each 0    Blood Glucose Monitoring Suppl (ACCU-CHEK GUIDE) w/Device KIT Check blood sugars daily as directed 1 kit 0    DULoxetine (CYMBALTA) 30 MG capsule Take 1 capsule (30 mg total) by mouth daily. (Patient not taking: Reported on 07/20/2021) 30 capsule 2 Not Taking   hydroxypropyl methylcellulose (ISOPTO TEARS) 2.5 % ophthalmic solution Place 1 drop into both eyes 3 (three) times daily as needed. Dry eyes    prn at unknown   senna-docusate (SENOKOT-S) 8.6-50 MG tablet Take 1 tablet by mouth daily as needed.    prn at unknown    Social History   Socioeconomic History   Marital status: Widowed    Spouse name: Not on file   Number of children: 1   Years of education: Not on file   Highest education level: Bachelor's degree (e.g., BA, AB, BS)  Occupational History   Occupation: retired  Tobacco Use   Smoking status: Never   Smokeless tobacco: Never  Substance and Sexual Activity   Alcohol use: No   Drug use: No   Sexual activity: Never  Other Topics Concern   Not on file  Social History Narrative   Not on file   Social Determinants of Health   Financial Resource Strain: Not on file  Food Insecurity: Not on file  Transportation Needs: Not on file  Physical Activity: Not on file  Stress: Not on  file  Social Connections: Not on file  Intimate Partner Violence: Not on file    Family History  Problem Relation Age of Onset   Heart failure Father    Heart disease Father    Hypertension Mother    Diabetes Mother    Heart disease Mother    Cancer Sister        breast   Breast cancer Sister 10   Cancer Maternal Grandfather    Cancer Maternal Aunt        x3   Breast cancer Daughter 33      Review of systems  complete and found to be negative unless listed above    PHYSICAL EXAM General: Pleasant elderly Caucasian female, well nourished, in no acute distress.  Sitting upright in PCU bed with caretaker at bedside. HEENT:  Normocephalic and atraumatic.  Erythema around left eyelid. Neck:  No JVD.  Lungs: Normal work of breathing on 3L O2 by [place]. Bibasilar crackles.   Heart: HRRR . Normal S1 and S2 without gallops or murmurs. Radial & DP pulses 2+ bilaterally. Abdomen: Soft, nontender, mildly-distended.  Msk: Normal strength and tone for age. Extremities: Warm and well perfused. No clubbing, cyanosis.  Trace peripheral edema, much improved.   Neuro: Alert and oriented X 3. Psych:  Answers questions appropriately.   Labs:   Lab Results  Component Value Date   WBC 6.4 07/23/2021   HGB 12.9 07/23/2021   HCT 37.7 07/23/2021   MCV 116.7 (H) 07/23/2021   PLT 233 07/23/2021    Recent Labs  Lab 07/21/21 0459 07/22/21 0605 07/23/21 0506  NA 142   < > 139  K 4.1   < > 3.6  CL 105   < > 98  CO2 29   < > 32  BUN 18   < > 25*  CREATININE 1.26*   < > 1.45*  CALCIUM 8.8*   < > 9.2  PROT 6.2*  --   --   BILITOT 1.4*  --   --   ALKPHOS 61  --   --   ALT 8  --   --   AST 27  --   --   GLUCOSE 105*   < > 133*   < > = values in this interval not displayed.    No results found for: CKTOTAL, CKMB, CKMBINDEX, TROPONINI  Lab Results  Component Value Date   CHOL 149 08/26/2017   CHOL 142 02/27/2016   CHOL 149 01/15/2015   Lab Results  Component Value Date   HDL 47  08/26/2017   HDL 64 02/27/2016   HDL 51 01/15/2015   Lab Results  Component Value Date   LDLCALC 74 08/26/2017   LDLCALC 54 02/27/2016   LDLCALC 67 01/15/2015   Lab Results  Component Value Date   TRIG 141 08/26/2017   TRIG 120 02/27/2016   TRIG 155 (H) 01/15/2015   Lab Results  Component Value Date   CHOLHDL 3.2 08/26/2017   No results found for: LDLDIRECT    Radiology: [facility] Chest Port 1 View  Result Date: 07/22/2021 CLINICAL DATA:  Shortness of breath and pleural effusion. EXAM: PORTABLE CHEST 1 VIEW COMPARISON:  July 20, 2021 FINDINGS: Cardiac silhouette is partially obscured but appears enlarged and stable with similar central vascular congestion. Aortic atherosclerosis. Similar perihilar infiltrates. Similar small left pleural effusion and new small right pleural effusion. Prior anterior cervical fusion with similar fracture/discontinuity of the lower aspect of the cervical plate. IMPRESSION: 1. Cardiomegaly and central vascular congestion with perihilar and bibasilar infiltrates common likely reflecting edema/CHF. 2. New small right pleural effusion with similar small left pleural effusion. Electronically Signed   By: Dahlia Bailiff M.D.   On: 07/22/2021 09:44   DG Chest Portable 1 View  Result Date: 07/20/2021 CLINICAL DATA:  Shortness of breath EXAM: PORTABLE CHEST 1 VIEW COMPARISON:  Portable exam 1201 hours compared to 09/10/2020 Correlation: Cervical spine radiographs 01/24/2019 FINDINGS: Enlargement of cardiac silhouette with pulmonary vascular congestion. Atherosclerotic calcification aorta. BILATERAL perihilar to basilar infiltrates favor pulmonary edema and CHF. Small LEFT pleural effusion and LEFT basilar  atelectasis. No pneumothorax. Prior cervical spine fusion with chronic fracture of the cervical plate. IMPRESSION: Enlargement of cardiac silhouette with pulmonary vascular congestion and diffuse pulmonary infiltrates favoring CHF. LEFT pleural effusion and basilar  atelectasis. Aortic Atherosclerosis (ICD10-I70.0). Electronically Signed   By: Lavonia Dana M.D.   On: 07/20/2021 12:17   ECHOCARDIOGRAM COMPLETE  Result Date: 07/21/2021    ECHOCARDIOGRAM REPORT   Patient Name:   Renee Carpenter Date of Exam: 07/21/2021 Medical Rec #:  [id number]          Height:       65.0 in Accession #:    [id number]         Weight:       171.3 lb Date of Birth:  [birth year]          BSA:          1.852 m Patient Age:    29 years           BP:           129/84 mmHg Patient Gender: F                  HR:           84 bpm. Exam Location:  [facility] Procedure: 2D Echo, Color Doppler and Cardiac Doppler Indications:     I50.31 congestive heart failure-Acute Diastolic  History:         Patient has no prior history of Echocardiogram examinations.                  CKD; Risk Factors:Hypertension and Diabetes.  Sonographer:     Charmayne Sheer Referring Phys:  [place] Diagnosing Phys: Serafina Royals MD  Sonographer Comments: Suboptimal subcostal window. IMPRESSIONS  1. Left ventricular ejection fraction, by estimation, is 25 to 30%. The left ventricle has severely decreased function. The left ventricle demonstrates global hypokinesis. The left ventricular internal cavity size was mildly to moderately dilated. Left ventricular diastolic parameters are consistent with Grade I diastolic dysfunction (impaired relaxation).  2. Right ventricular systolic function is normal. The right ventricular size is normal.  3. Left atrial size was moderately dilated.  4. Right atrial size was moderately dilated.  5. The mitral valve is normal in structure. Moderate to severe mitral valve regurgitation.  6. Tricuspid valve regurgitation is mild to moderate.  7. The aortic valve is normal in structure. Aortic valve regurgitation is mild. FINDINGS  Left Ventricle: Left ventricular ejection fraction, by estimation, is 25 to 30%. The left ventricle has severely decreased function. The left ventricle demonstrates global  hypokinesis. The left ventricular internal cavity size was mildly to moderately dilated. There is no left ventricular hypertrophy. Left ventricular diastolic parameters are consistent with Grade I diastolic dysfunction (impaired relaxation). Right Ventricle: The right ventricular size is normal. No increase in right ventricular wall thickness. Right ventricular systolic function is normal. Left Atrium: Left atrial size was moderately dilated. Right Atrium: Right atrial size was moderately dilated. Pericardium: There is no evidence of pericardial effusion. Mitral Valve: The mitral valve is normal in structure. Moderate to severe mitral valve regurgitation. MV peak gradient, 10.4 mmHg. The mean mitral valve gradient is 5.0 mmHg. Tricuspid Valve: The tricuspid valve is normal in structure. Tricuspid valve regurgitation is mild to moderate. Aortic Valve: The aortic valve is normal in structure. Aortic valve regurgitation is mild. Aortic valve mean gradient measures 4.0 mmHg. Aortic valve peak gradient measures 7.7 mmHg. Aortic valve area, by VTI measures  1.64 cm. Pulmonic Valve: The pulmonic valve was normal in structure. Pulmonic valve regurgitation is not visualized. Aorta: The aortic root and ascending aorta are structurally normal, with no evidence of dilitation. IAS/Shunts: No atrial level shunt detected by color flow Doppler.  LEFT VENTRICLE PLAX 2D LVIDd:         5.49 cm   Diastology LVIDs:         4.66 cm   LV e' medial:    3.48 cm/s LV PW:         0.98 cm   LV E/e' medial:  34.8 LV IVS:        0.72 cm   LV e' lateral:   4.24 cm/s LVOT diam:     1.80 cm   LV E/e' lateral: 28.5 LV SV:         41 LV SV Index:   22 LVOT Area:     2.54 cm  RIGHT VENTRICLE RV Basal diam:  2.90 cm TAPSE (M-mode): 2.4 cm LEFT ATRIUM             Index        RIGHT ATRIUM           Index LA diam:        4.30 cm 2.32 cm/m   RA Area:     13.80 cm LA Vol (A2C):   63.8 ml 34.45 ml/m  RA Volume:   33.70 ml  18.20 ml/m LA Vol (A4C):    76.4 ml 41.25 ml/m LA Biplane Vol: 73.0 ml 39.41 ml/m  AORTIC VALVE                    PULMONIC VALVE AV Area (Vmax):    1.60 cm     PV Vmax:       0.67 m/s AV Area (Vmean):   1.70 cm     PV Vmean:      48.900 cm/s AV Area (VTI):     1.64 cm     PV VTI:        0.110 m AV Vmax:           139.00 cm/s  PV Peak grad:  1.8 mmHg AV Vmean:          90.400 cm/s  PV Mean grad:  1.0 mmHg AV VTI:            0.248 m AV Peak Grad:      7.7 mmHg AV Mean Grad:      4.0 mmHg LVOT Vmax:         87.50 cm/s LVOT Vmean:        60.300 cm/s LVOT VTI:          0.160 m LVOT/AV VTI ratio: 0.65  AORTA Ao Root diam: 3.00 cm MITRAL VALVE MV Area (PHT): 8.34 cm     SHUNTS MV Area VTI:   1.37 cm     Systemic VTI:  0.16 m MV Peak grad:  10.4 mmHg    Systemic Diam: 1.80 cm MV Mean grad:  5.0 mmHg MV Vmax:       1.61 m/s MV Vmean:      107.0 cm/s MV Decel Time: 91 msec MV E velocity: 121.00 cm/s MV A velocity: 145.00 cm/s MV E/A ratio:  0.83 Serafina Royals MD Electronically signed by Serafina Royals MD Signature Date/Time: 07/21/2021/12:29:02 PM    Final     ECHO 07/21/2021 LVEF 25-30% with global hypokinesis.,  G1 DD, mod to severe  MR  06/2017 INTERPRETATION  MODERATE LV SYSTOLIC DYSFUNCTION (See above)   WITH MILD LVH  NORMAL RIGHT VENTRICULAR SYSTOLIC FUNCTION  MODERATE VALVULAR REGURGITATION (See above)  NO VALVULAR STENOSIS  MILD to MODERATE MR  MODERATE TR  MILD AR, PR  MILD PHTN  EF 40%   TELEMETRY reviewed by me: Sinus rhythm rate 85  EKG reviewed by me: NSR 90, LBBB similar to 09/2020  ASSESSMENT AND PLAN:  The patient is a 88 year old female with a past medical history significant for HFrEF (07/21/2021 LVEF 25-30% with global hypokinesis, G1 DD, mod to severe MR decrased from 40% 06/2017), hypertension, OSA, type 2 diabetes, CKD 3 who presented to [facility] ED with worsening shortness of breath than her baseline.  She was reportedly hypoxic to the 80s at her nursing facility and EMS was called.  Cardiology is consulted  for further assistance with her CHF.  #Acute on chronic HFrEF (07/21/2021 LVEF 25-30% with global hypokinesis, G1 DD, mod to severe MR decrased from 40% 06/2017) Patient presents with 3 weeks of worsening shortness of breath, and reported hypoxia with O2 sat in the low 80s to 90s per her caregiver.  She apparently loves to eat ice but cannot quantify the specific amount she intakes per day, and does not watch her salt. Appears to be volume overloaded on presentation, requiring 4-5 L of supplemental oxygen.  BNP elevated to 1845, left pleural effusion by chest x-ray. -S/p 40 mg x 3 doses with ~3.2L output recorded since admission. Cr bumped from admission, so will transition to PO lasix 44m daily as of yesterday -Still has bibasilar crackles and a supplemental O2 requirement, repeat CXR yesterday with small bilateral pleural effusions.  But too small for thoracentesis or further intervention -Hold home losartan due to CKD.  And will consider reinstatement later -continue low dose metoprolol .  -echocardiogram resulted with reduced EF 25-30% as above - will continue GDMT as above. -Strict monitoring of I's and O's while inpatient, daily weights -appreciate HF nurse education   #Hypertension Blood pressure better controlled. holding losartan due to kidney function. -added metoprolol as above.   #CKD 3 We will DC losartan.  Other plan as above. Renal function slightly bumped overnight - trending creatinine 1.3-1.26-1.46, GFR 36-40-33  #OSA -Recommend CPAP/BiPAP nightly while inpatient.  Overall we are looking to hopefully improve by tomorrow morning that the patient may be able for discharge to home if oxygen requirements are minimal to none.  Signed: BCorey Skains, MD FResnick Neuropsychiatric Hospital At Ucla2/16/2023, 6:17 PM KSelect Speciality Hospital Of Fort MyersCardiology

## 2021-07-23 NOTE — Progress Notes (Addendum)
°  Progress Note   Patient: Renee Carpenter [id number] DOB: 09-20-1925 DOA: 07/20/2021     3 DOS: the patient was seen and examined on 07/23/2021       Brief hospital course: Renee Carpenter is a 88 y.o. F with sCHF EF 40-45%, well controlled, spinal stenosis, DM, Parkinson's disease, HTN and OSA who presented from home with hypoxia after noting 3 weeks progressive SOB, worsening orthopnea and leg swelling.  In the ER, SpO2 85%, tachypneic.  Needed up to 5L O2 overnight.  CXR showed bilateral infiltrates consistent with edema, BNP ~2000.  Started on IV Lasix and admitted for CHF.    Assessment and Plan: * Acute on chronic combined systolic and diastolic CHF (congestive heart failure) (HCC) Echo here with EF down to 25-30%, grade I DD, no signficant valvular disease. --Still on O2, but feels symptoms are better. --s/p furosemide 40 mg IV twice a day, transitioned to oral lasix 40 mg daily on 2/15 Plan: --cont oral lasix 40 mg daily --cont Toprol --hold home losartan 2/2 CKD  Parkinson's disease ([place]) - Continue Sinemet, ropinirole  Type 2 diabetes mellitus with hyperglycemia with chronic kidney disease, without long-term current use of insulin (HCC) A1c 7.7 - Hold glimepiride - Sliding scale correction  Acute respiratory failure with hypoxia (HCC) Hypoxic and in respiratory distress at admission, needing 5 to 6 L O2.  Due to CHF  --O2 requirement down to 3L today --Continue supplemental O2 to keep sats between 88-92%, wean as tolerated   OSA (obstructive sleep apnea)- (present on admission) - CPAP at night  Stage 3b chronic kidney disease (CKD) ([place])- (present on admission) baseline 1.2-1.6, currently within range.  Anxiety, generalized- (present on admission) - Continue Cymbalta  COPD, mild ([place])- (present on admission) No wheezing - Albuterol as needed  Essential hypertension- (present on admission) Blood pressure controlled --cont metop --cont lasix as  oral 40 mg daily  --hold home losartan 2/2 CKD   DVT prophylaxis: Lovenox SQ Code Status: Full code  Family Communication: Renee Carpenter, health care POA updated on the phone today Status is: inpatient Dispo:   The patient is from: home Anticipated d/c is to: home Anticipated d/c date is: 1-2 days Patient currently is not medically stable to d/c due to: cardiology to clear      Subjective:  Pt reported breathing about the same.  Urine output has decreased after switching to oral lasix.  Normal oral intake.   Physical Exam: Vitals:   07/23/21 1141 07/23/21 1601 07/23/21 1611 07/23/21 1914  BP: 107/63 127/67 131/68 (!) 103/47  Pulse: 89 87 85 85  Resp: 20 20 19 18   Temp: 97.7 F (36.5 C) 97.6 F (36.4 C) 97.9 F (36.6 C) 97.6 F (36.4 C)  TempSrc: Oral Oral    SpO2: 94% 94% 95% 96%  Weight:      Height:        Constitutional: NAD, AAOx3 HEENT: conjunctivae and lids normal, EOMI CV: No cyanosis.   RESP: normal respiratory effort, on 2L Extremities: No effusions, edema in BLE SKIN: warm, dry Neuro: II - XII grossly intact.   Psych: Normal mood and affect.  Appropriate judgement and reason   Author: Enzo Bi, MD 07/23/2021 7:59 PM  For on call review [email or web address].

## 2021-07-23 NOTE — Care Management Important Message (Signed)
Important Message  Patient Details  Name: Renee Carpenter MRN: [id number] Date of Birth: 03/16/1926   Medicare Important Message Given:  Yes     Johnell Comings 07/23/2021, 3:24 PM

## 2021-07-24 LAB — MAGNESIUM: Magnesium: 1.7 mg/dL (ref 1.7–2.4)

## 2021-07-24 LAB — CBC
HCT: 40.7 % (ref 36.0–46.0)
Hemoglobin: 13.7 g/dL (ref 12.0–15.0)
MCH: 39.5 pg — ABNORMAL HIGH (ref 26.0–34.0)
MCHC: 33.7 g/dL (ref 30.0–36.0)
MCV: 117.3 fL — ABNORMAL HIGH (ref 80.0–100.0)
Platelets: 266 10*3/uL (ref 150–400)
RBC: 3.47 MIL/uL — ABNORMAL LOW (ref 3.87–5.11)
RDW: 17.1 % — ABNORMAL HIGH (ref 11.5–15.5)
WBC: 6.8 10*3/uL (ref 4.0–10.5)
nRBC: 0 % (ref 0.0–0.2)

## 2021-07-24 LAB — GLUCOSE, CAPILLARY: Glucose-Capillary: 161 mg/dL — ABNORMAL HIGH (ref 70–99)

## 2021-07-24 LAB — BASIC METABOLIC PANEL
Anion gap: 9 (ref 5–15)
BUN: 26 mg/dL — ABNORMAL HIGH (ref 8–23)
CO2: 32 mmol/L (ref 22–32)
Calcium: 9.3 mg/dL (ref 8.9–10.3)
Chloride: 99 mmol/L (ref 98–111)
Creatinine, Ser: 1.37 mg/dL — ABNORMAL HIGH (ref 0.44–1.00)
GFR, Estimated: 36 mL/min — ABNORMAL LOW (ref >60)
Glucose, Bld: 145 mg/dL — ABNORMAL HIGH (ref 70–99)
Potassium: 3.8 mmol/L (ref 3.5–5.1)
Sodium: 140 mmol/L (ref 135–145)

## 2021-07-24 MED ORDER — METOPROLOL SUCCINATE ER 25 MG PO TB24: 12.5000 mg | ORAL_TABLET | Freq: Every day | ORAL | 2 refills | Status: DC

## 2021-07-24 MED ORDER — ACCU-CHEK FASTCLIX LANCETS MISC: 1 refills | Status: AC

## 2021-07-24 MED ORDER — ACCU-CHEK GUIDE VI STRP: ORAL_STRIP | 1 refills | Status: AC

## 2021-07-24 MED ORDER — FUROSEMIDE 40 MG PO TABS: 40.0000 mg | ORAL_TABLET | Freq: Every day | ORAL | 2 refills | Status: DC

## 2021-07-24 MED ORDER — BLOOD GLUCOSE MONITOR KIT: PACK | 0 refills | Status: DC

## 2021-07-24 NOTE — Discharge Summary (Signed)
Physician Discharge Summary   Renee Carpenter  female DOB: Oct 04, 1925  JUB:[id number]  PCP: Maple Hudson., MD  Admit date: 07/20/2021 Discharge date: 07/24/2021  Admitted From: home Disposition:  home CODE STATUS: Full code   Hospital Course:  For full details, please see H&P, progress notes, consult notes and ancillary notes.  Briefly,  Renee Carpenter is a 88 y.o. F with sCHF, spinal stenosis, DM, Parkinson's disease, HTN and OSA who presented from home with hypoxia after noting 3 weeks progressive SOB, worsening orthopnea and leg swelling.   In the ER, SpO2 85%, tachypneic.  Needed up to 5L O2.  CXR showed bilateral infiltrates consistent with edema, BNP ~2000.  Started on IV Lasix and admitted for CHF.  Acute respiratory failure with hypoxia (HCC) --needing 5 to 6 L O2.  Due to CHF. --O2 requirement improved with diuresis.  Pt was weaned down to RA prior to discharge.  * Acute on chronic combined systolic and diastolic CHF (congestive heart failure) (HCC) Echo here with EF down to 25-30%, grade I DD, no signficant valvular disease. --cardiology consulted, with Dr. Gwen Pounds. --Pt received furosemide 40 mg IV twice a day, transitioned to oral lasix 40 mg daily on 2/15.   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   Anxiety, generalized- (present on admission) - Continue Cymbalta   COPD, mild (HCC)- (present on admission) No wheezing - Albuterol as needed    Essential hypertension- (present on admission) Blood pressure controlled --Toprol 12.5 mg daily started by cardio --discharged on lasix as oral 40 mg daily  --losartan wasn't on home med list PTA   Discharge Diagnoses:  Principal Problem:   Acute on chronic combined systolic and diastolic CHF (congestive heart failure) (HCC) Active Problems:   Essential hypertension   COPD, mild (HCC)   Anxiety, generalized   Stage 3b chronic kidney disease (CKD) (HCC)   OSA (obstructive sleep apnea)   Acute respiratory failure with hypoxia (HCC)   Type 2 diabetes mellitus with chronic kidney disease, without long-term current use of insulin (HCC)   Parkinson's disease (HCC)   30 Day Unplanned Readmission Risk Score    Flowsheet Row ED to Hosp-Admission (Current) from 07/20/2021 in [facility]  30 Day Unplanned Readmission Risk Score (%) 16.51 Filed at 07/24/2021 0801       This score is the patient's risk of an unplanned readmission within 30 days of being discharged (0 -100%). The score is based on dignosis, age, lab data, medications, orders, and past utilization.   Low:  0-14.9   Medium: 15-21.9   High: 22-29.9   Extreme: 30 and above         Discharge Instructions:  Allergies as of 07/24/2021       Reactions   Acetaminophen Itching, Other (See Comments)   Stomach upset   Aspirin Itching, Other (See Comments)   Stomach pain   Metformin And Related    nausea   Other Other (See Comments)   Novocaine, strange  feeling   Sulfa Antibiotics Other (See Comments)   Sulfasalazine    Other reaction(s): Other (See Comments)   Tizanidine    Diarrhea, almost passed out, felt dizzy and confused   Caffeine Palpitations   Ginger Palpitations   Ibuprofen Rash        Medication List     STOP taking these medications    acetaminophen 500 MG tablet Commonly known as: TYLENOL   ALPHA LIPOIC ACID PO       TAKE these medications    Accu-Chek FastClix  Lancets Misc Just check first thing in the morning to ensure no low blood sugar. What changed: additional instructions   Accu-Chek Guide test strip Generic drug: glucose blood Just check first thing in the morning to ensure no low blood sugar. What changed: See the new instructions.   Accu-Chek Guide w/Device Kit Check blood sugars daily as directed   albuterol 108 (90 Base) MCG/ACT inhaler Commonly known as: VENTOLIN HFA INHALE 2 PUFFS INTO THE LUNGS EVERY 4 HOURS AS NEEDED FOR WHEEZING OR SHORTNESS OF BREATH   ALIGN PO Take 1 capsule by mouth daily.   blood glucose meter kit and supplies Kit Just check first thing in the morning to ensure no low blood sugar. What changed: additional instructions   carbidopa-levodopa 25-100 MG tablet Commonly known as: SINEMET IR Take 1 tablet by mouth 3 (three) times daily. Take 1 tablet three times a day   Co Q10 200 MG Caps Take 1 capsule by mouth daily.   DULoxetine 20 MG capsule Commonly known as: CYMBALTA Take 40 mg by mouth daily. What changed: Another medication with the same name was removed. Continue taking this medication, and follow the directions you see here.   esomeprazole 40 MG capsule Commonly known as: NEXIUM TAKE 1 CAPSULE BY MOUTH EVERY DAY What changed:  how much to take additional instructions   furosemide 40 MG tablet Commonly known as: LASIX Take 1 tablet (40 mg total) by mouth daily.   gabapentin 600 MG tablet Commonly known as: NEURONTIN TAKE 1 TABLET(600 MG) BY MOUTH THREE TIMES DAILY What changed: See the new instructions.   glimepiride 4 MG tablet Commonly known as: AMARYL TAKE 1 TABLET(4 MG) BY MOUTH DAILY BEFORE BREAKFAST   hydroxypropyl methylcellulose / hypromellose 2.5 % ophthalmic solution Commonly known as: ISOPTO TEARS / GONIOVISC Place 1 drop into both eyes 3 (three) times daily as needed. Dry eyes   MAGnesium-Oxide 400 (241.3 Mg) MG tablet Generic drug: magnesium oxide TAKE 1 TABLET  BY MOUTH TWICE DAILY   metoprolol succinate 25 MG 24 hr tablet Commonly known as: TOPROL-XL Take 0.5 tablets (12.5 mg total) by mouth daily.   MULTIPLE VITAMIN PO Take 1 tablet by mouth daily.   rOPINIRole 0.5 MG tablet Commonly known as: REQUIP Take 0.5 mg by mouth 3 (three) times daily.   senna-docusate 8.6-50 MG tablet Commonly known as: Senokot-S Take 1 tablet by mouth daily as needed.   tamsulosin 0.4 MG Caps capsule Commonly known as: FLOMAX TAKE 1 CAPSULE(0.4 MG) BY MOUTH DAILY         Follow-up Information     Jerrol Banana., MD Follow up in 1 week(s).   Specialty: Family Medicine Contact information: [address] [place] [address] [phone]         Corey Skains, MD Follow up in 1 week(s).   Specialty: Cardiology Contact information: [address] [facility] [place] [address] [phone]  Allergies  Allergen Reactions   Acetaminophen Itching and Other (See Comments)    Stomach upset   Aspirin Itching and Other (See Comments)    Stomach pain   Metformin And Related     nausea   Other Other (See Comments)    Novocaine, strange feeling   Sulfa Antibiotics Other (See Comments)   Sulfasalazine     Other reaction(s): Other (See Comments)   Tizanidine     Diarrhea, almost passed out, felt dizzy and confused   Caffeine Palpitations   Ginger Palpitations   Ibuprofen Rash     The results of significant diagnostics from this hospitalization (including imaging, microbiology, ancillary and laboratory) are listed below for reference.   Consultations:   Procedures/Studies: DG Chest Port 1 View  Result Date: 07/22/2021 CLINICAL DATA:  Shortness of breath and pleural effusion. EXAM: PORTABLE CHEST 1 VIEW COMPARISON:  July 20, 2021 FINDINGS: Cardiac silhouette is partially obscured but appears enlarged and stable with similar central vascular congestion. Aortic  atherosclerosis. Similar perihilar infiltrates. Similar small left pleural effusion and new small right pleural effusion. Prior anterior cervical fusion with similar fracture/discontinuity of the lower aspect of the cervical plate. IMPRESSION: 1. Cardiomegaly and central vascular congestion with perihilar and bibasilar infiltrates common likely reflecting edema/CHF. 2. New small right pleural effusion with similar small left pleural effusion. Electronically Signed   By: Dahlia Bailiff M.D.   On: 07/22/2021 09:44   DG Chest Portable 1 View  Result Date: 07/20/2021 CLINICAL DATA:  Shortness of breath EXAM: PORTABLE CHEST 1 VIEW COMPARISON:  Portable exam 1201 hours compared to 09/10/2020 Correlation: Cervical spine radiographs 01/24/2019 FINDINGS: Enlargement of cardiac silhouette with pulmonary vascular congestion. Atherosclerotic calcification aorta. BILATERAL perihilar to basilar infiltrates favor pulmonary edema and CHF. Small LEFT pleural effusion and LEFT basilar atelectasis. No pneumothorax. Prior cervical spine fusion with chronic fracture of the cervical plate. IMPRESSION: Enlargement of cardiac silhouette with pulmonary vascular congestion and diffuse pulmonary infiltrates favoring CHF. LEFT pleural effusion and basilar atelectasis. Aortic Atherosclerosis (ICD10-I70.0). Electronically Signed   By: Lavonia Dana M.D.   On: 07/20/2021 12:17   ECHOCARDIOGRAM COMPLETE  Result Date: 07/21/2021    ECHOCARDIOGRAM REPORT   Patient Name:   Renee Carpenter Date of Exam: 07/21/2021 Medical Rec #:  [id number]          Height:       65.0 in Accession #:    [id number]         Weight:       171.3 lb Date of Birth:  04-Dec-1925          BSA:          1.852 m Patient Age:    72 years           BP:           129/84 mmHg Patient Gender: F                  HR:           84 bpm. Exam Location:  [facility] Procedure: 2D Echo, Color Doppler and Cardiac Doppler Indications:     I50.31 congestive heart failure-Acute Diastolic   History:         Patient has no prior history of Echocardiogram examinations.                  CKD; Risk Factors:Hypertension and Diabetes.  Sonographer:     Charmayne Sheer Referring Phys:  [place]  Diagnosing Phys: Serafina Royals MD  Sonographer Comments: Suboptimal subcostal window. IMPRESSIONS  1. Left ventricular ejection fraction, by estimation, is 25 to 30%. The left ventricle has severely decreased function. The left ventricle demonstrates global hypokinesis. The left ventricular internal cavity size was mildly to moderately dilated. Left ventricular diastolic parameters are consistent with Grade I diastolic dysfunction (impaired relaxation).  2. Right ventricular systolic function is normal. The right ventricular size is normal.  3. Left atrial size was moderately dilated.  4. Right atrial size was moderately dilated.  5. The mitral valve is normal in structure. Moderate to severe mitral valve regurgitation.  6. Tricuspid valve regurgitation is mild to moderate.  7. The aortic valve is normal in structure. Aortic valve regurgitation is mild. FINDINGS  Left Ventricle: Left ventricular ejection fraction, by estimation, is 25 to 30%. The left ventricle has severely decreased function. The left ventricle demonstrates global hypokinesis. The left ventricular internal cavity size was mildly to moderately dilated. There is no left ventricular hypertrophy. Left ventricular diastolic parameters are consistent with Grade I diastolic dysfunction (impaired relaxation). Right Ventricle: The right ventricular size is normal. No increase in right ventricular wall thickness. Right ventricular systolic function is normal. Left Atrium: Left atrial size was moderately dilated. Right Atrium: Right atrial size was moderately dilated. Pericardium: There is no evidence of pericardial effusion. Mitral Valve: The mitral valve is normal in structure. Moderate to severe mitral valve regurgitation. MV peak gradient, 10.4 mmHg.  The mean mitral valve gradient is 5.0 mmHg. Tricuspid Valve: The tricuspid valve is normal in structure. Tricuspid valve regurgitation is mild to moderate. Aortic Valve: The aortic valve is normal in structure. Aortic valve regurgitation is mild. Aortic valve mean gradient measures 4.0 mmHg. Aortic valve peak gradient measures 7.7 mmHg. Aortic valve area, by VTI measures 1.64 cm. Pulmonic Valve: The pulmonic valve was normal in structure. Pulmonic valve regurgitation is not visualized. Aorta: The aortic root and ascending aorta are structurally normal, with no evidence of dilitation. IAS/Shunts: No atrial level shunt detected by color flow Doppler.  LEFT VENTRICLE PLAX 2D LVIDd:         5.49 cm   Diastology LVIDs:         4.66 cm   LV e' medial:    3.48 cm/s LV PW:         0.98 cm   LV E/e' medial:  34.8 LV IVS:        0.72 cm   LV e' lateral:   4.24 cm/s LVOT diam:     1.80 cm   LV E/e' lateral: 28.5 LV SV:         41 LV SV Index:   22 LVOT Area:     2.54 cm  RIGHT VENTRICLE RV Basal diam:  2.90 cm TAPSE (M-mode): 2.4 cm LEFT ATRIUM             Index        RIGHT ATRIUM           Index LA diam:        4.30 cm 2.32 cm/m   RA Area:     13.80 cm LA Vol (A2C):   63.8 ml 34.45 ml/m  RA Volume:   33.70 ml  18.20 ml/m LA Vol (A4C):   76.4 ml 41.25 ml/m LA Biplane Vol: 73.0 ml 39.41 ml/m  AORTIC VALVE                    PULMONIC VALVE  AV Area (Vmax):    1.60 cm     PV Vmax:       0.67 m/s AV Area (Vmean):   1.70 cm     PV Vmean:      48.900 cm/s AV Area (VTI):     1.64 cm     PV VTI:        0.110 m AV Vmax:           139.00 cm/s  PV Peak grad:  1.8 mmHg AV Vmean:          90.400 cm/s  PV Mean grad:  1.0 mmHg AV VTI:            0.248 m AV Peak Grad:      7.7 mmHg AV Mean Grad:      4.0 mmHg LVOT Vmax:         87.50 cm/s LVOT Vmean:        60.300 cm/s LVOT VTI:          0.160 m LVOT/AV VTI ratio: 0.65  AORTA Ao Root diam: 3.00 cm MITRAL VALVE MV Area (PHT): 8.34 cm     SHUNTS MV Area VTI:   1.37 cm     Systemic  VTI:  0.16 m MV Peak grad:  10.4 mmHg    Systemic Diam: 1.80 cm MV Mean grad:  5.0 mmHg MV Vmax:       1.61 m/s MV Vmean:      107.0 cm/s MV Decel Time: 91 msec MV E velocity: 121.00 cm/s MV A velocity: 145.00 cm/s MV E/A ratio:  0.83 Serafina Royals MD Electronically signed by Serafina Royals MD Signature Date/Time: 07/21/2021/12:29:02 PM    Final       Labs: BNP (last 3 results) Recent Labs    07/20/21 1158 07/21/21 0459  BNP 1,845.6* 6,834.1*   Basic Metabolic Panel: Recent Labs  Lab 07/20/21 1158 07/21/21 0459 07/22/21 0605 07/23/21 0506 07/24/21 0513  NA 137 142 142 139 140  K 4.5 4.1 3.9 3.6 3.8  CL 102 105 101 98 99  CO2 $Re'26 29 31 'HiN$ 32 32  GLUCOSE 296* 105* 131* 133* 145*  BUN $Re'19 18 21 'ljM$ 25* 26*  CREATININE 1.38* 1.26* 1.47* 1.45* 1.37*  CALCIUM 8.7* 8.8* 9.2 9.2 9.3  MG  --  2.1  --  1.8 1.7  PHOS  --  3.4  --   --   --    Liver Function Tests: Recent Labs  Lab 07/20/21 1158 07/21/21 0459  AST 31 27  ALT 8 8  ALKPHOS 77 61  BILITOT 1.6* 1.4*  PROT 6.7 6.2*  ALBUMIN 3.5 3.2*   No results for input(s): LIPASE, AMYLASE in the last 168 hours. No results for input(s): AMMONIA in the last 168 hours. CBC: Recent Labs  Lab 07/20/21 1158 07/21/21 0459 07/23/21 0506 07/24/21 0513  WBC 5.8 5.6 6.4 6.8  NEUTROABS 3.6  --   --   --   HGB 12.9 13.4 12.9 13.7  HCT 38.8 40.0 37.7 40.7  MCV 122.0* 119.0* 116.7* 117.3*  PLT 207 197 233 266   Cardiac Enzymes: No results for input(s): CKTOTAL, CKMB, CKMBINDEX, TROPONINI in the last 168 hours. BNP: Invalid input(s): POCBNP CBG: Recent Labs  Lab 07/23/21 0714 07/23/21 1142 07/23/21 1611 07/23/21 2044 07/24/21 0816  GLUCAP 126* 171* 220* 191* 161*   D-Dimer No results for input(s): DDIMER in the last 72 hours. Hgb A1c No results for input(s): HGBA1C in the last 72  hours. Lipid Profile No results for input(s): CHOL, HDL, LDLCALC, TRIG, CHOLHDL, LDLDIRECT in the last 72 hours. Thyroid function studies No results  for input(s): TSH, T4TOTAL, T3FREE, THYROIDAB in the last 72 hours.  Invalid input(s): FREET3 Anemia work up No results for input(s): VITAMINB12, FOLATE, FERRITIN, TIBC, IRON, RETICCTPCT in the last 72 hours. Urinalysis    Component Value Date/Time   COLORURINE YELLOW (A) 07/20/2021 1448   APPEARANCEUR CLEAR (A) 07/20/2021 1448   APPEARANCEUR Hazy 02/06/2014 1958   LABSPEC 1.009 07/20/2021 1448   LABSPEC 1.020 02/06/2014 1958   PHURINE 7.0 07/20/2021 1448   GLUCOSEU 50 (A) 07/20/2021 1448   GLUCOSEU Negative 02/06/2014 1958   HGBUR SMALL (A) 07/20/2021 1448   BILIRUBINUR NEGATIVE 07/20/2021 1448   BILIRUBINUR negative 04/24/2021 1359   BILIRUBINUR Negative 02/06/2014 1958   KETONESUR NEGATIVE 07/20/2021 1448   PROTEINUR 100 (A) 07/20/2021 1448   UROBILINOGEN negative (A) 04/24/2021 1359   NITRITE NEGATIVE 07/20/2021 1448   LEUKOCYTESUR TRACE (A) 07/20/2021 1448   LEUKOCYTESUR 2+ 02/06/2014 1958   Sepsis Labs Invalid input(s): PROCALCITONIN,  WBC,  LACTICIDVEN Microbiology Recent Results (from the past 240 hour(s))  Resp Panel by RT-PCR (Flu A&B, Covid) Nasopharyngeal Swab     Status: None   Collection Time: 07/20/21 11:58 AM   Specimen: Nasopharyngeal Swab; Nasopharyngeal(NP) swabs in vial transport medium  Result Value Ref Range Status   SARS Coronavirus 2 by RT PCR NEGATIVE NEGATIVE Final    Comment: (NOTE) SARS-CoV-2 target nucleic acids are NOT DETECTED.  The SARS-CoV-2 RNA is generally detectable in upper respiratory specimens during the acute phase of infection. The lowest concentration of SARS-CoV-2 viral copies this assay can detect is 138 copies/mL. A negative result does not preclude SARS-Cov-2 infection and should not be used as the sole basis for treatment or other patient management decisions. A negative result may occur with  improper specimen collection/handling, submission of specimen other than nasopharyngeal swab, presence of viral mutation(s) within  the areas targeted by this assay, and inadequate number of viral copies(<138 copies/mL). A negative result must be combined with clinical observations, patient history, and epidemiological information. The expected result is Negative.  Fact Sheet for Patients:  EntrepreneurPulse.com.au  Fact Sheet for Healthcare Providers:  IncredibleEmployment.be  This test is no t yet approved or cleared by the [place] FDA and  has been authorized for detection and/or diagnosis of SARS-CoV-2 by FDA under an Emergency Use Authorization (EUA). This EUA will remain  in effect (meaning this test can be used) for the duration of the COVID-19 declaration under Section 564(b)(1) of the Act, 21 U.S.C.section 360bbb-3(b)(1), unless the authorization is terminated  or revoked sooner.       Influenza A by PCR NEGATIVE NEGATIVE Final   Influenza B by PCR NEGATIVE NEGATIVE Final    Comment: (NOTE) The Xpert Xpress SARS-CoV-2/FLU/RSV plus assay is intended as an aid in the diagnosis of influenza from Nasopharyngeal swab specimens and should not be used as a sole basis for treatment. Nasal washings and aspirates are unacceptable for Xpert Xpress SARS-CoV-2/FLU/RSV testing.  Fact Sheet for Patients: EntrepreneurPulse.com.au  Fact Sheet for Healthcare Providers: IncredibleEmployment.be  This test is not yet approved or cleared by the [place] FDA and has been authorized for detection and/or diagnosis of SARS-CoV-2 by FDA under an Emergency Use Authorization (EUA). This EUA will remain in effect (meaning this test can be used) for the duration of the COVID-19 declaration under Section 564(b)(1) of the Act, 21 U.S.C. section  360bbb-3(b)(1), unless the authorization is terminated or revoked.  Performed at [facility], [address] [place], [place] [address]      Total time spend on discharging this patient,  including the last patient exam, discussing the hospital stay, instructions for ongoing care as it relates to all pertinent caregivers, as well as preparing the medical discharge records, prescriptions, and/or referrals as applicable, is 35 minutes.    Enzo Bi, MD  [facility] 07/24/2021, 8:36 AM

## 2021-07-24 NOTE — TOC Transition Note (Signed)
Transition of Care [facility]) - CM/SW Discharge Note   Patient Details  Name: Renee Carpenter MRN: [id number] Date of Birth: 11/17/1925  [facility] CM/SW Contact:  Gildardo Griffes, LCSW Phone Number: 07/24/2021, 9:12 AM   Clinical Narrative:     Patient to discharge today back to [facility] apartment. She reports her friend Genevie Cheshire will take her today. CSW informed patient of HH recommendations, patient reports she does not feel like she needs that, states she has a caregiver at home and her friend Genevie Cheshire. Patient reports no needs at this time.   Please notify TOC should dc needs arise.   Final next level of care: Home/Self Care Barriers to Discharge: No Barriers Identified   Patient Goals and CMS Choice Patient states their goals for this hospitalization and ongoing recovery are:: to go home CMS Medicare.gov Compare Post Acute Care list provided to:: Patient Choice offered to / list presented to : Patient  Discharge Placement                Patient to be transferred to facility by: friend Billy   Patient and family notified of of transfer: 07/24/21  Discharge Plan and Services                                     Social Determinants of Health (SDOH) Interventions     Readmission Risk Interventions No flowsheet data found.

## 2021-07-24 NOTE — Progress Notes (Signed)
[facility] NOTE       Patient ID: Renee Carpenter MRN: [id number] DOB/AGE: Oct 22, 1925 88 y.o.  Admit date: 07/20/2021 Referring Physician Dr. Shawna Clamp Primary Physician Dr. Miguel Aschoff Primary Cardiologist Dr. Nehemiah Massed (last seen in 2019)  Reason for Consultation CHF  HPI: The patient is a 88 year old female with a past medical history significant for HFrEF (07/21/2021 LVEF 25-30% with global hypokinesis, G1 DD, mod to severe MR decrased from 40% 06/2017), hypertension, OSA, type 2 diabetes, CKD 3 who presented to [facility] ED with worsening shortness of breath than her baseline.  She was reportedly hypoxic to the 80s at her nursing facility and EMS was called.  Cardiology is consulted for further assistance with her CHF.  Interval history: -Patient has had improvements of symptoms from admission and feels much better at this time but still requiring minimal oxygen supplementation.  Patient does have 2627 out urine for improvements with continued stabilization of glomerular filtration rate of 34.  There has been no evidence of chest pain or other significant concerns at this time.  She has had transition of furosemide to oral furosemide at this time  Review of systems complete and found to be negative unless listed above   Past Medical History:  Diagnosis Date   Anemia    WAS ANEMIC   Arthritis    Blood transfusion    HAD 1 SEVERAL YRS AGO...3-4 YR   Chronic kidney disease    SOME DECREASE IN KIDNEY FUNCTION   Diabetes mellitus without complication (HCC)    Hypertension    Normal cardiac stress test    REQUESTING COPY FROM [facility]   Normal echocardiogram    REQUESTING THAT RESULT -- DR KAHN    PONV (postoperative nausea and vomiting)    Shortness of breath    Spondylolysis 1998    Past Surgical History:  Procedure Laterality Date   ABDOMINAL HYSTERECTOMY     BACK SURGERY     2 CERVICAL SURGERIES ALSO   CATARACTS     RIGHT EYE  CATARACT   CERVICAL DISCECTOMY  08/1995 and 2011   C4-,C5-6, C6-7 anterior cervical; Dr. Ellene Route fusion and plating   LAMINECTOMY     decompressive; Dr. Burman Riis in [place] MICRODISCECTOMY  04/14/2011   Procedure: LUMBAR LAMINECTOMY/DECOMPRESSION MICRODISCECTOMY;  Surgeon: [name];  Location: [place] NEURO ORS;  Service: Neurosurgery;  Laterality: N/A;  Lumbar Three and Lumbar Four Laminectomy   TONSILLECTOMY AND ADENOIDECTOMY  1939    No medications prior to admission.    Social History   Socioeconomic History   Marital status: Widowed    Spouse name: Not on file   Number of children: 1   Years of education: Not on file   Highest education level: Bachelor's degree (e.g., BA, AB, BS)  Occupational History   Occupation: retired  Tobacco Use   Smoking status: Never   Smokeless tobacco: Never  Substance and Sexual Activity   Alcohol use: No   Drug use: No   Sexual activity: Never  Other Topics Concern   Not on file  Social History Narrative   Not on file   Social Determinants of Health   Financial Resource Strain: Not on file  Food Insecurity: Not on file  Transportation Needs: Not on file  Physical Activity: Not on file  Stress: Not on file  Social Connections: Not on file  Intimate Partner Violence: Not on file    Family History  Problem Relation Age  of Onset   Heart failure Father    Heart disease Father    Hypertension Mother    Diabetes Mother    Heart disease Mother    Cancer Sister        breast   Breast cancer Sister 74   Cancer Maternal Grandfather    Cancer Maternal Aunt        x3   Breast cancer Daughter 51      Review of systems complete and found to be negative unless listed above    PHYSICAL EXAM General: Pleasant elderly Caucasian female, well nourished, in no acute distress.  Sitting upright in PCU bed with caretaker at bedside. HEENT:  Normocephalic and atraumatic.  Erythema around left eyelid. Neck:  No  JVD.  Lungs: Normal work of breathing on 3L O2 by [place]. Bibasilar crackles.   Heart: HRRR . Normal S1 and S2 without gallops or murmurs. Radial & DP pulses 2+ bilaterally. Abdomen: Soft, nontender, mildly-distended.  Msk: Normal strength and tone for age. Extremities: Warm and well perfused. No clubbing, cyanosis.  Trace peripheral edema, much improved.   Neuro: Alert and oriented X 3. Psych:  Answers questions appropriately.   Labs:   Lab Results  Component Value Date   WBC 6.8 07/24/2021   HGB 13.7 07/24/2021   HCT 40.7 07/24/2021   MCV 117.3 (H) 07/24/2021   PLT 266 07/24/2021    Recent Labs  Lab 07/21/21 0459 07/22/21 0605 07/24/21 0513  NA 142   < > 140  K 4.1   < > 3.8  CL 105   < > 99  CO2 29   < > 32  BUN 18   < > 26*  CREATININE 1.26*   < > 1.37*  CALCIUM 8.8*   < > 9.3  PROT 6.2*  --   --   BILITOT 1.4*  --   --   ALKPHOS 61  --   --   ALT 8  --   --   AST 27  --   --   GLUCOSE 105*   < > 145*   < > = values in this interval not displayed.    No results found for: CKTOTAL, CKMB, CKMBINDEX, TROPONINI  Lab Results  Component Value Date   CHOL 149 08/26/2017   CHOL 142 02/27/2016   CHOL 149 01/15/2015   Lab Results  Component Value Date   HDL 47 08/26/2017   HDL 64 02/27/2016   HDL 51 01/15/2015   Lab Results  Component Value Date   LDLCALC 74 08/26/2017   LDLCALC 54 02/27/2016   LDLCALC 67 01/15/2015   Lab Results  Component Value Date   TRIG 141 08/26/2017   TRIG 120 02/27/2016   TRIG 155 (H) 01/15/2015   Lab Results  Component Value Date   CHOLHDL 3.2 08/26/2017   No results found for: LDLDIRECT    Radiology: [facility] Chest Port 1 View  Result Date: 07/22/2021 CLINICAL DATA:  Shortness of breath and pleural effusion. EXAM: PORTABLE CHEST 1 VIEW COMPARISON:  July 20, 2021 FINDINGS: Cardiac silhouette is partially obscured but appears enlarged and stable with similar central vascular congestion. Aortic atherosclerosis. Similar perihilar  infiltrates. Similar small left pleural effusion and new small right pleural effusion. Prior anterior cervical fusion with similar fracture/discontinuity of the lower aspect of the cervical plate. IMPRESSION: 1. Cardiomegaly and central vascular congestion with perihilar and bibasilar infiltrates common likely reflecting edema/CHF. 2. New small right pleural effusion with similar small  left pleural effusion. Electronically Signed   By: Dahlia Bailiff M.D.   On: 07/22/2021 09:44   DG Chest Portable 1 View  Result Date: 07/20/2021 CLINICAL DATA:  Shortness of breath EXAM: PORTABLE CHEST 1 VIEW COMPARISON:  Portable exam 1201 hours compared to 09/10/2020 Correlation: Cervical spine radiographs 01/24/2019 FINDINGS: Enlargement of cardiac silhouette with pulmonary vascular congestion. Atherosclerotic calcification aorta. BILATERAL perihilar to basilar infiltrates favor pulmonary edema and CHF. Small LEFT pleural effusion and LEFT basilar atelectasis. No pneumothorax. Prior cervical spine fusion with chronic fracture of the cervical plate. IMPRESSION: Enlargement of cardiac silhouette with pulmonary vascular congestion and diffuse pulmonary infiltrates favoring CHF. LEFT pleural effusion and basilar atelectasis. Aortic Atherosclerosis (ICD10-I70.0). Electronically Signed   By: Lavonia Dana M.D.   On: 07/20/2021 12:17   ECHOCARDIOGRAM COMPLETE  Result Date: 07/21/2021    ECHOCARDIOGRAM REPORT   Patient Name:   ALARA BUCKLEY Date of Exam: 07/21/2021 Medical Rec #:  [id number]          Height:       65.0 in Accession #:    [id number]         Weight:       171.3 lb Date of Birth:  March 19, 1926          BSA:          1.852 m Patient Age:    15 years           BP:           129/84 mmHg Patient Gender: F                  HR:           84 bpm. Exam Location:  [facility] Procedure: 2D Echo, Color Doppler and Cardiac Doppler Indications:     I50.31 congestive heart failure-Acute Diastolic  History:         Patient has no prior  history of Echocardiogram examinations.                  CKD; Risk Factors:Hypertension and Diabetes.  Sonographer:     Charmayne Sheer Referring Phys:  [place] Diagnosing Phys: Serafina Royals MD  Sonographer Comments: Suboptimal subcostal window. IMPRESSIONS  1. Left ventricular ejection fraction, by estimation, is 25 to 30%. The left ventricle has severely decreased function. The left ventricle demonstrates global hypokinesis. The left ventricular internal cavity size was mildly to moderately dilated. Left ventricular diastolic parameters are consistent with Grade I diastolic dysfunction (impaired relaxation).  2. Right ventricular systolic function is normal. The right ventricular size is normal.  3. Left atrial size was moderately dilated.  4. Right atrial size was moderately dilated.  5. The mitral valve is normal in structure. Moderate to severe mitral valve regurgitation.  6. Tricuspid valve regurgitation is mild to moderate.  7. The aortic valve is normal in structure. Aortic valve regurgitation is mild. FINDINGS  Left Ventricle: Left ventricular ejection fraction, by estimation, is 25 to 30%. The left ventricle has severely decreased function. The left ventricle demonstrates global hypokinesis. The left ventricular internal cavity size was mildly to moderately dilated. There is no left ventricular hypertrophy. Left ventricular diastolic parameters are consistent with Grade I diastolic dysfunction (impaired relaxation). Right Ventricle: The right ventricular size is normal. No increase in right ventricular wall thickness. Right ventricular systolic function is normal. Left Atrium: Left atrial size was moderately dilated. Right Atrium: Right atrial size was moderately dilated. Pericardium: There is no evidence  of pericardial effusion. Mitral Valve: The mitral valve is normal in structure. Moderate to severe mitral valve regurgitation. MV peak gradient, 10.4 mmHg. The mean mitral valve gradient is 5.0  mmHg. Tricuspid Valve: The tricuspid valve is normal in structure. Tricuspid valve regurgitation is mild to moderate. Aortic Valve: The aortic valve is normal in structure. Aortic valve regurgitation is mild. Aortic valve mean gradient measures 4.0 mmHg. Aortic valve peak gradient measures 7.7 mmHg. Aortic valve area, by VTI measures 1.64 cm. Pulmonic Valve: The pulmonic valve was normal in structure. Pulmonic valve regurgitation is not visualized. Aorta: The aortic root and ascending aorta are structurally normal, with no evidence of dilitation. IAS/Shunts: No atrial level shunt detected by color flow Doppler.  LEFT VENTRICLE PLAX 2D LVIDd:         5.49 cm   Diastology LVIDs:         4.66 cm   LV e' medial:    3.48 cm/s LV PW:         0.98 cm   LV E/e' medial:  34.8 LV IVS:        0.72 cm   LV e' lateral:   4.24 cm/s LVOT diam:     1.80 cm   LV E/e' lateral: 28.5 LV SV:         41 LV SV Index:   22 LVOT Area:     2.54 cm  RIGHT VENTRICLE RV Basal diam:  2.90 cm TAPSE (M-mode): 2.4 cm LEFT ATRIUM             Index        RIGHT ATRIUM           Index LA diam:        4.30 cm 2.32 cm/m   RA Area:     13.80 cm LA Vol (A2C):   63.8 ml 34.45 ml/m  RA Volume:   33.70 ml  18.20 ml/m LA Vol (A4C):   76.4 ml 41.25 ml/m LA Biplane Vol: 73.0 ml 39.41 ml/m  AORTIC VALVE                    PULMONIC VALVE AV Area (Vmax):    1.60 cm     PV Vmax:       0.67 m/s AV Area (Vmean):   1.70 cm     PV Vmean:      48.900 cm/s AV Area (VTI):     1.64 cm     PV VTI:        0.110 m AV Vmax:           139.00 cm/s  PV Peak grad:  1.8 mmHg AV Vmean:          90.400 cm/s  PV Mean grad:  1.0 mmHg AV VTI:            0.248 m AV Peak Grad:      7.7 mmHg AV Mean Grad:      4.0 mmHg LVOT Vmax:         87.50 cm/s LVOT Vmean:        60.300 cm/s LVOT VTI:          0.160 m LVOT/AV VTI ratio: 0.65  AORTA Ao Root diam: 3.00 cm MITRAL VALVE MV Area (PHT): 8.34 cm     SHUNTS MV Area VTI:   1.37 cm     Systemic VTI:  0.16 m MV Peak grad:  10.4 mmHg     Systemic  Diam: 1.80 cm MV Mean grad:  5.0 mmHg MV Vmax:       1.61 m/s MV Vmean:      107.0 cm/s MV Decel Time: 91 msec MV E velocity: 121.00 cm/s MV A velocity: 145.00 cm/s MV E/A ratio:  0.83 Serafina Royals MD Electronically signed by Serafina Royals MD Signature Date/Time: 07/21/2021/12:29:02 PM    Final     ECHO 07/21/2021 LVEF 25-30% with global hypokinesis.,  G1 DD, mod to severe MR  06/2017 INTERPRETATION  MODERATE LV SYSTOLIC DYSFUNCTION (See above)   WITH MILD LVH  NORMAL RIGHT VENTRICULAR SYSTOLIC FUNCTION  MODERATE VALVULAR REGURGITATION (See above)  NO VALVULAR STENOSIS  MILD to MODERATE MR  MODERATE TR  MILD AR, PR  MILD PHTN  EF 40%   TELEMETRY reviewed by me: Sinus rhythm rate 85  EKG reviewed by me: NSR 90, LBBB similar to 09/2020  ASSESSMENT AND PLAN:  The patient is a 88 year old female with a past medical history significant for HFrEF (07/21/2021 LVEF 25-30% with global hypokinesis, G1 DD, mod to severe MR decrased from 40% 06/2017), hypertension, OSA, type 2 diabetes, CKD 3 who presented to [facility] ED with worsening shortness of breath than her baseline.  She was reportedly hypoxic to the 80s at her nursing facility and EMS was called.  Cardiology is consulted for further assistance with her CHF.  #Acute on chronic HFrEF (07/21/2021 LVEF 25-30% with global hypokinesis, G1 DD, mod to severe MR decrased from 40% 06/2017) Patient presents with 3 weeks of worsening shortness of breath, and reported hypoxia with O2 sat in the low 80s to 90s per her caregiver.  She apparently loves to eat ice but cannot quantify the specific amount she intakes per day, and does not watch her salt. Appears to be volume overloaded on presentation, requiring 4-5 L of supplemental oxygen.  BNP elevated to 1845, left pleural effusion by chest x-ray. -S/p 40 mg x 3 doses with ~3.2L output recorded since admission. Cr bumped from admission, so will transition to PO lasix 40mg  daily as of yesterday -Still  has bibasilar crackles and a supplemental O2 requirement, repeat CXR yesterday with small bilateral pleural effusions.  But too small for thoracentesis or further intervention -Hold home losartan due to CKD.  And will consider reinstatement later -continue low dose metoprolol .  -echocardiogram resulted with reduced EF 25-30% as above - will continue GDMT as above. -Strict monitoring of I's and O's while inpatient, daily weights -appreciate HF nurse education   #Hypertension Blood pressure better controlled. holding losartan due to kidney function. -added metoprolol as above.   #CKD 3 We will DC losartan.  Other plan as above. Renal function slightly bumped overnight - trending creatinine 1.3-1.26-1.46, GFR 36-40-33  #OSA -Recommend CPAP/BiPAP nightly while inpatient.  Overall we are looking to hopefully improve by this morning that the patient may be able for discharge to home if oxygen requirements are minimal to none.  Signed: Corey Skains , MD [facility] 07/24/2021, 3:07 PM [facility]

## 2021-07-24 NOTE — Progress Notes (Signed)
SATURATION QUALIFICATIONS: (This note is used to comply with regulatory documentation for home oxygen)  Patient Saturations on Room Air at Rest = 94%  Patient Saturations on Room Air while transferring from [facility] to chair = 93%  Pt does not ambulate per pt and caregiver at bedside. Pt only transfers from wheelchair to chair, moving per normal per pt and caregiver, oxygen ranges from 93-94 pre and post transfer.

## 2021-07-25 NOTE — Progress Notes (Signed)
Patient ID: Renee Carpenter, female    DOB: May 26, 1926, 88 y.o.   MRN: [id number]  HPI  Renee Carpenter is a 88 y/o female with a history of DM, HTN, CKD, anemia, sleep apnea, spondylolysis and chronic heart failure.   Echo report from 07/21/21 reviewed and showed an EF of 25-30% along with moderate LAE, moderate/severe MR and mild/moderate TR.   Admitted 07/20/21 due to generalized weakness, bilateral leg swelling and difficulty breathing due to HF exacerbation. Initially given IV lasix with transition to oral diuretics. Cardiology consult obtained. Elevated troponin thought to be due to demand ischemia. PT/OT evaluations done. Losartan held due to CKD.    Discharged after 4 days.   She presents today for her initial visit with a chief complaint of minimal shortness of breath with moderate exertion. She describes this as chronic in nature. She has associated fatigue, intermittent palpitations, occasional light-headedness and chronic pain along with this. She denies any difficulty sleeping, abdominal distention, pedal edema, chest pain or cough.   Unable to weight due to leg weakness and inability to stand safely long enough. She is able to stand long enough to pivot but, otherwise, is in a wheelchair.   Used to love adding salt to her food but since her recent discharge, she's not been adding any and her caregiver says that the salt shaker has been put where she can't reach it. Unsure of how much liquids she drinks but admits that she likes to eat ice.   Past Medical History:  Diagnosis Date   Anemia    WAS ANEMIC   Arthritis    Blood transfusion    HAD 1 SEVERAL YRS AGO...3-4 YR   CHF (congestive heart failure) (HCC)    Chronic kidney disease    SOME DECREASE IN KIDNEY FUNCTION   Diabetes mellitus without complication ([place])    Hypertension    Normal cardiac stress test    REQUESTING COPY FROM [facility]   Normal echocardiogram    REQUESTING THAT RESULT -- DR [name]    PONV  (postoperative nausea and vomiting)    Shortness of breath    Sleep apnea    Spondylolysis 06/07/1996   Past Surgical History:  Procedure Laterality Date   ABDOMINAL HYSTERECTOMY     BACK SURGERY     2 CERVICAL SURGERIES ALSO   CATARACTS     RIGHT EYE CATARACT   CERVICAL DISCECTOMY  08/1995 and 2011   C4-,C5-6, C6-7 anterior cervical; Dr. Ellene Route fusion and plating   LAMINECTOMY     decompressive; Dr. Burman Riis in [place] MICRODISCECTOMY  04/14/2011   Procedure: LUMBAR LAMINECTOMY/DECOMPRESSION MICRODISCECTOMY;  Surgeon: [name];  Location: [facility];  Service: Neurosurgery;  Laterality: N/A;  Lumbar Three and Lumbar Four Laminectomy   TONSILLECTOMY AND ADENOIDECTOMY  1939   Family History  Problem Relation Age of Onset   Heart failure Father    Heart disease Father    Hypertension Mother    Diabetes Mother    Heart disease Mother    Cancer Sister        breast   Breast cancer Sister 106   Cancer Maternal Grandfather    Cancer Maternal Aunt        x3   Breast cancer Daughter 56   Social History   Tobacco Use   Smoking status: Never   Smokeless tobacco: Never  Substance Use Topics   Alcohol use: No   Allergies  Allergen Reactions  Acetaminophen Itching and Other (See Comments)    Stomach upset   Aspirin Itching and Other (See Comments)    Stomach pain   Metformin And Related     nausea   Other Other (See Comments)    Novocaine, strange feeling   Sulfa Antibiotics Other (See Comments)   Sulfasalazine     Other reaction(s): Other (See Comments)   Tizanidine     Diarrhea, almost passed out, felt dizzy and confused   Caffeine Palpitations   Ginger Palpitations   Ibuprofen Rash   Prior to Admission medications   Medication Sig Start Date End Date Taking? Authorizing Provider  Accu-Chek FastClix Lancets MISC Just check first thing in the morning to ensure no low blood sugar. 07/24/21  Yes Enzo Bi, MD  albuterol  (VENTOLIN HFA) 108 (90 Base) MCG/ACT inhaler INHALE 2 PUFFS INTO THE LUNGS EVERY 4 HOURS AS NEEDED FOR WHEEZING OR SHORTNESS OF BREATH 02/19/21  Yes Jerrol Banana., MD  blood glucose meter kit and supplies KIT Just check first thing in the morning to ensure no low blood sugar. 07/24/21  Yes Enzo Bi, MD  Blood Glucose Monitoring Suppl (ACCU-CHEK GUIDE) w/Device KIT Check blood sugars daily as directed 08/04/20  Yes Jerrol Banana., MD  carbidopa-levodopa (SINEMET IR) 25-100 MG tablet Take 1 tablet by mouth 3 (three) times daily. Take 1 tablet three times a day 05/21/20  Yes [provider]  Coenzyme Q10 (CO Q10) 200 MG CAPS Take 1 capsule by mouth daily. 08/09/11  Yes [provider]  DULoxetine (CYMBALTA) 20 MG capsule Take 40 mg by mouth daily.   Yes [provider]  esomeprazole (NEXIUM) 40 MG capsule TAKE 1 CAPSULE BY MOUTH EVERY DAY Patient taking differently: Take 40 mg by mouth daily. TAKE 1 CAPSULE BY MOUTH EVERY DAY 05/21/21  Yes Birdie Sons, MD  furosemide (LASIX) 40 MG tablet Take 1 tablet (40 mg total) by mouth daily. 07/24/21 10/22/21 Yes Enzo Bi, MD  gabapentin (NEURONTIN) 600 MG tablet TAKE 1 TABLET(600 MG) BY MOUTH THREE TIMES DAILY Patient taking differently: Take 600 mg by mouth 3 (three) times daily. 02/06/21  Yes Jerrol Banana., MD  glimepiride (AMARYL) 4 MG tablet TAKE 1 TABLET(4 MG) BY MOUTH DAILY BEFORE BREAKFAST 05/21/21  Yes Birdie Sons, MD  glucose blood (ACCU-CHEK GUIDE) test strip Just check first thing in the morning to ensure no low blood sugar. 07/24/21  Yes Enzo Bi, MD  hydroxypropyl methylcellulose (ISOPTO TEARS) 2.5 % ophthalmic solution Place 1 drop into both eyes 3 (three) times daily as needed. Dry eyes    Yes [provider]  MAGNESIUM-OXIDE 400 (241.3 Mg) MG tablet TAKE 1 TABLET BY MOUTH TWICE DAILY 03/17/17  Yes Jerrol Banana., MD  metoprolol succinate (TOPROL-XL) 25 MG 24 hr tablet Take 0.5  tablets (12.5 mg total) by mouth daily. 07/24/21 10/22/21 Yes Enzo Bi, MD  MULTIPLE VITAMIN PO Take 1 tablet by mouth daily. 08/09/11  Yes [provider]  Probiotic Product (ALIGN PO) Take 1 capsule by mouth daily.   Yes [provider]  rOPINIRole (REQUIP) 0.5 MG tablet Take 0.5 mg by mouth 3 (three) times daily. 10/10/12  Yes [provider]  tamsulosin (FLOMAX) 0.4 MG CAPS capsule TAKE 1 CAPSULE(0.4 MG) BY MOUTH DAILY 09/07/20  Yes Jerrol Banana., MD    Review of Systems  Constitutional:  Positive for fatigue. Negative for appetite change.  HENT:  Positive for hearing  loss. Negative for congestion and sore throat.   Eyes: Negative.   Respiratory:  Positive for shortness of breath ("little bit"). Negative for cough and chest tightness.   Cardiovascular:  Positive for palpitations (at times). Negative for chest pain and leg swelling.  Gastrointestinal:  Negative for abdominal distention and abdominal pain.  Endocrine: Negative.   Genitourinary: Negative.   Musculoskeletal:  Positive for back pain and neck pain (at times).  Skin: Negative.   Allergic/Immunologic: Negative.   Neurological:  Positive for weakness and light-headedness. Negative for dizziness.       Neuropathy  Hematological:  Negative for adenopathy. Bruises/bleeds easily.  Psychiatric/Behavioral:  Negative for dysphoric mood and sleep disturbance (sleeping on 1 pillow). The patient is not nervous/anxious.    Vitals:   07/27/21 0904  BP: 129/60  Pulse: 79  Resp: 16  SpO2: 90%  Height: _0  (1.651 m)   Wt Readings from Last 3 Encounters:  07/24/21 157 lb 3 oz (71.3 kg)  07/17/21 171 lb 6.4 oz (77.7 kg)  04/12/21 160 lb (72.6 kg)   Lab Results  Component Value Date   CREATININE 1.37 (H) 07/24/2021   CREATININE 1.45 (H) 07/23/2021   CREATININE 1.47 (H) 07/22/2021   Physical Exam Vitals and nursing note reviewed. Exam conducted with a chaperone present (caregiver).  Constitutional:       Appearance: Normal appearance.  HENT:     Head: Normocephalic and atraumatic.     Right Ear: Decreased hearing noted.     Left Ear: Decreased hearing noted.  Cardiovascular:     Rate and Rhythm: Normal rate and regular rhythm.  Pulmonary:     Effort: Pulmonary effort is normal. No respiratory distress.     Breath sounds: No wheezing or rales.  Abdominal:     General: There is no distension.     Palpations: Abdomen is soft.     Tenderness: There is no abdominal tenderness.  Musculoskeletal:        General: No tenderness.     Cervical back: Normal range of motion and neck supple.     Right lower leg: No edema.     Left lower leg: No edema.  Skin:    General: Skin is warm and dry.  Neurological:     General: No focal deficit present.     Mental Status: She is alert and oriented to person, place, and time.  Psychiatric:        Mood and Affect: Mood normal.        Behavior: Behavior normal.        Thought Content: Thought content normal.    Assessment & Plan:  1: Chronic heart failure with reduced ejection fraction- - NYHA class II - euvolemic today - unable to safely weigh due to inability to stand long enough; reviewed the importance of checking labs for swelling, making sure pants aren't tighter and being aware of her symptoms - had previously been adding salt to her food but hasn't added any since being home; reviewed how to read food labels and to keep daily sodium intake to ~ 2057m /day; low sodium cookbook provided - likes to eat ice and explained how to quantify that; needs to measure how many ounces her cup at home holds so she can keep daily fluid intake to ~ 60-64 ounces - to see cardiology (Renee Carpenter 08/17/21 - on GDMT of metoprolol (just started in this admission) - discussed adding other GDMT being mindful of her BP and renal function -  BNP 07/21/21 was 2091.0  2: HTN with CKD- - BP looks good (129/60 - saw PCP (Renee Carpenter) 04/24/22; returns tomorrow - BMP  07/24/21 reviewed and showed sodium 140, potassium 3.8, creatinine 1.37 and GFR 36  3: DM- - A1c 07/21/21 was 7.7% - doesn't check her glucose consistently  4: Obstructive sleep apnea- - saw pulmonology (Renee Carpenter) 07/17/21 - admits that she is supposed to be wearing CPAP; explained how untreated sleep apnea can be harmful to her heart and encouraged her to start wearing it  5: Neuropathy- - saw neurology Renee Carpenter) 07/15/21   Medication list reviewed.   Return in 6 weeks, sooner if needed.

## 2021-07-27 ENCOUNTER — Encounter: Payer: Self-pay | Admitting: Family

## 2021-07-27 ENCOUNTER — Other Ambulatory Visit: Payer: Self-pay | Admitting: Family Medicine

## 2021-07-27 ENCOUNTER — Other Ambulatory Visit: Payer: Self-pay

## 2021-07-27 ENCOUNTER — Ambulatory Visit: Payer: Medicare PPO | Attending: Family | Admitting: Family

## 2021-07-27 ENCOUNTER — Telehealth: Payer: Self-pay

## 2021-07-27 VITALS — BP 129/60 | HR 79 | Resp 16 | Ht 65.0 in

## 2021-07-27 DIAGNOSIS — E1122 Type 2 diabetes mellitus with diabetic chronic kidney disease: Secondary | ICD-10-CM | POA: Diagnosis not present

## 2021-07-27 DIAGNOSIS — M4300 Spondylolysis, site unspecified: Secondary | ICD-10-CM | POA: Insufficient documentation

## 2021-07-27 DIAGNOSIS — I130 Hypertensive heart and chronic kidney disease with heart failure and stage 1 through stage 4 chronic kidney disease, or unspecified chronic kidney disease: Secondary | ICD-10-CM | POA: Insufficient documentation

## 2021-07-27 DIAGNOSIS — N1832 Chronic kidney disease, stage 3b: Secondary | ICD-10-CM | POA: Diagnosis not present

## 2021-07-27 DIAGNOSIS — I5022 Chronic systolic (congestive) heart failure: Secondary | ICD-10-CM

## 2021-07-27 DIAGNOSIS — G4733 Obstructive sleep apnea (adult) (pediatric): Secondary | ICD-10-CM | POA: Insufficient documentation

## 2021-07-27 DIAGNOSIS — E1136 Type 2 diabetes mellitus with diabetic cataract: Secondary | ICD-10-CM | POA: Insufficient documentation

## 2021-07-27 DIAGNOSIS — I10 Essential (primary) hypertension: Secondary | ICD-10-CM | POA: Diagnosis not present

## 2021-07-27 DIAGNOSIS — M792 Neuralgia and neuritis, unspecified: Secondary | ICD-10-CM | POA: Diagnosis not present

## 2021-07-27 DIAGNOSIS — N189 Chronic kidney disease, unspecified: Secondary | ICD-10-CM | POA: Diagnosis not present

## 2021-07-27 DIAGNOSIS — G4730 Sleep apnea, unspecified: Secondary | ICD-10-CM | POA: Insufficient documentation

## 2021-07-27 DIAGNOSIS — D649 Anemia, unspecified: Secondary | ICD-10-CM | POA: Diagnosis not present

## 2021-07-27 DIAGNOSIS — G629 Polyneuropathy, unspecified: Secondary | ICD-10-CM

## 2021-07-27 NOTE — Patient Instructions (Addendum)
°  Keep daily sodium intake to about 2000mg  daily   Drink about 60-64 ounces of fluid daily.

## 2021-07-27 NOTE — Telephone Encounter (Addendum)
Transition Care Management Unsuccessful Follow-up Telephone Call  Date of discharge and from where:  TCM DC [facility] 07-24-21 DX acute systolic/diastolic CHF   Attempts:  1st Attempt  Reason for unsuccessful TCM follow-up call:  Unable to leave message  Transition Care Management Unsuccessful Follow-up Telephone Call  Date of discharge and from where:  TCM DC [facility] 07-24-21 DX acute systolic/diastolic CHF   Attempts:  2nd Attempt  Reason for unsuccessful TCM follow-up call:  Unable to leave message

## 2021-07-28 ENCOUNTER — Ambulatory Visit (INDEPENDENT_AMBULATORY_CARE_PROVIDER_SITE_OTHER): Payer: Medicare PPO | Admitting: Physician Assistant

## 2021-07-28 ENCOUNTER — Other Ambulatory Visit: Payer: Self-pay

## 2021-07-28 ENCOUNTER — Encounter: Payer: Self-pay | Admitting: Physician Assistant

## 2021-07-28 VITALS — BP 107/86 | HR 81 | Resp 15 | Wt 156.7 lb

## 2021-07-28 DIAGNOSIS — J9601 Acute respiratory failure with hypoxia: Secondary | ICD-10-CM | POA: Diagnosis not present

## 2021-07-28 DIAGNOSIS — R159 Full incontinence of feces: Secondary | ICD-10-CM | POA: Diagnosis not present

## 2021-07-28 DIAGNOSIS — E1142 Type 2 diabetes mellitus with diabetic polyneuropathy: Secondary | ICD-10-CM | POA: Diagnosis not present

## 2021-07-28 DIAGNOSIS — I5041 Acute combined systolic (congestive) and diastolic (congestive) heart failure: Secondary | ICD-10-CM | POA: Diagnosis not present

## 2021-07-28 DIAGNOSIS — I5022 Chronic systolic (congestive) heart failure: Secondary | ICD-10-CM | POA: Diagnosis not present

## 2021-07-28 DIAGNOSIS — J302 Other seasonal allergic rhinitis: Secondary | ICD-10-CM | POA: Diagnosis not present

## 2021-07-28 DIAGNOSIS — M6281 Muscle weakness (generalized): Secondary | ICD-10-CM | POA: Diagnosis not present

## 2021-07-28 DIAGNOSIS — R499 Unspecified voice and resonance disorder: Secondary | ICD-10-CM | POA: Diagnosis not present

## 2021-07-28 DIAGNOSIS — Z741 Need for assistance with personal care: Secondary | ICD-10-CM | POA: Diagnosis not present

## 2021-07-28 DIAGNOSIS — I5043 Acute on chronic combined systolic (congestive) and diastolic (congestive) heart failure: Secondary | ICD-10-CM | POA: Diagnosis not present

## 2021-07-28 DIAGNOSIS — R279 Unspecified lack of coordination: Secondary | ICD-10-CM | POA: Diagnosis not present

## 2021-07-28 DIAGNOSIS — R262 Difficulty in walking, not elsewhere classified: Secondary | ICD-10-CM | POA: Diagnosis not present

## 2021-07-28 DIAGNOSIS — Z9181 History of falling: Secondary | ICD-10-CM | POA: Diagnosis not present

## 2021-07-28 DIAGNOSIS — R41841 Cognitive communication deficit: Secondary | ICD-10-CM | POA: Diagnosis not present

## 2021-07-28 DIAGNOSIS — G20 Parkinson's disease: Secondary | ICD-10-CM | POA: Diagnosis not present

## 2021-07-28 DIAGNOSIS — R1312 Dysphagia, oropharyngeal phase: Secondary | ICD-10-CM | POA: Diagnosis not present

## 2021-07-28 DIAGNOSIS — R2681 Unsteadiness on feet: Secondary | ICD-10-CM | POA: Diagnosis not present

## 2021-07-28 MED ORDER — FLUTICASONE PROPIONATE 50 MCG/ACT NA SUSP: 2.0000 | Freq: Every day | NASAL | 6 refills | Status: DC

## 2021-07-28 NOTE — Assessment & Plan Note (Signed)
Stable and symptomatically improved in office. Continue to follow with cardiology

## 2021-07-28 NOTE — Assessment & Plan Note (Addendum)
Discussed need to upgrade current pad to a depends to avoid frequent clean ups.  Per pt has been occurring for a long time.  Pt wonders if insurance may provide what she needs

## 2021-07-28 NOTE — Telephone Encounter (Signed)
Requested medication (s) are due for refill today - unsure- listed as historical on medication list- notes state discontinued 07/24/21  Requested medication (s) are on the active medication list -yes  Future visit scheduled -today  Last refill: unsure- listed as historical  Notes to clinic: Request RF: listed as historical  Requested Prescriptions  Pending Prescriptions Disp Refills   DULoxetine (CYMBALTA) 30 MG capsule [Pharmacy Med Name: DULOXETINE DR 30MG CAPSULES] 30 capsule 2    Sig: TAKE 1 CAPSULE(30 MG) BY MOUTH DAILY     Psychiatry: Antidepressants - SNRI - duloxetine Failed - 07/27/2021 11:17 AM      Failed - Cr in normal range and within 360 days    Creatinine  Date Value Ref Range Status  02/06/2014 1.12 0.60 - 1.30 mg/dL Final   Creatinine, Ser  Date Value Ref Range Status  07/24/2021 1.37 (H) 0.44 - 1.00 mg/dL Final          Failed - Completed PHQ-2 or PHQ-9 in the last 360 days      Passed - eGFR is 30 or above and within 360 days    EGFR (African American)  Date Value Ref Range Status  02/06/2014 52 (L)  Final   GFR calc Af Amer  Date Value Ref Range Status  01/17/2020 44 (L) >59 mL/min/1.73 Final    Comment:    **[facility] currently reports eGFR in compliance with the current**   recommendations of the [employer]. [facility] will   update reporting as new guidelines are published from the NKF-ASN   Task force.    EGFR (Non-African Amer.)  Date Value Ref Range Status  02/06/2014 44 (L)  Final    Comment:    eGFR values <70m/min/1.73 m2 may be an indication of chronic kidney disease (CKD). Calculated eGFR is useful in patients with stable renal function. The eGFR calculation will not be reliable in acutely ill patients when serum creatinine is changing rapidly. It is not useful in  patients on dialysis. The eGFR calculation may not be applicable to patients at the low and high extremes of body sizes, pregnant women, and vegetarians.     GFR, Estimated  Date Value Ref Range Status  07/24/2021 36 (L) >60 mL/min Final    Comment:    (NOTE) Calculated using the CKD-EPI Creatinine Equation (2021)    eGFR  Date Value Ref Range Status  04/24/2021 35 (L) >59 mL/min/1.73 Final          Passed - Last BP in normal range    BP Readings from Last 1 Encounters:  07/27/21 129/60          Passed - Valid encounter within last 6 months    Recent Outpatient Visits           3 months ago [facility]   6 months ago Type 2 diabetes mellitus with diabetic polyneuropathy, without long-term current use of insulin ([facility]   [facility]   10 months ago Type 2 diabetes mellitus with diabetic polyneuropathy, without long-term current use of insulin ([facility]   [facility]   1 year ago Type 2 diabetes mellitus with diabetic polyneuropathy, without long-term current use of insulin ([facility]   [facility]   1 year ago Type 2 diabetes mellitus with diabetic polyneuropathy, without long-term current use of insulin ([facility] Jerrol Banana., MD       Future Appointments             Today [name], PA-C [facility]   In 1 week [name], PA-C [employer], [place]               Requested Prescriptions  Pending Prescriptions Disp Refills   DULoxetine (CYMBALTA) 30 MG capsule [Pharmacy Med Name: DULOXETINE DR $RemoveBefor'30MG'fSIhPxecBBsc$  CAPSULES] 30 capsule 2    Sig: TAKE 1 CAPSULE(30 MG) BY MOUTH DAILY     Psychiatry: Antidepressants - SNRI - duloxetine Failed - 07/27/2021 11:17 AM      Failed - Cr in normal range and within 360 days    Creatinine  Date Value Ref Range Status  02/06/2014 1.12 0.60 - 1.30 mg/dL Final   Creatinine, Ser  Date Value Ref Range Status  07/24/2021 1.37 (H) 0.44 - 1.00  mg/dL Final          Failed - Completed PHQ-2 or PHQ-9 in the last 360 days      Passed - eGFR is 30 or above and within 360 days    EGFR (African American)  Date Value Ref Range Status  02/06/2014 52 (L)  Final   GFR calc Af Amer  Date Value Ref Range Status  01/17/2020 44 (L) >59 mL/min/1.73 Final    Comment:    **[facility] currently reports eGFR in compliance with the current**   recommendations of the [employer]. [facility] will   update reporting as new guidelines are published from the NKF-ASN   Task force.    EGFR (Non-African Amer.)  Date Value Ref Range Status  02/06/2014 44 (L)  Final    Comment:    eGFR values <62mL/min/1.73 m2 may be an indication of chronic kidney disease (CKD). Calculated eGFR is useful in patients with stable renal function. The eGFR calculation will not be reliable in acutely ill patients when serum creatinine is changing rapidly. It is not useful in  patients on dialysis. The eGFR calculation may not be applicable to patients at the low and high extremes of body sizes, pregnant women, and vegetarians.    GFR, Estimated  Date Value Ref Range Status  07/24/2021 36 (L) >60 mL/min Final    Comment:    (NOTE) Calculated using the CKD-EPI Creatinine Equation (2021)    eGFR  Date Value Ref Range Status  04/24/2021 35 (L) >59 mL/min/1.73 Final          Passed - Last BP in normal range    BP Readings from Last 1 Encounters:  07/27/21 129/60          Passed - Valid encounter within last 6 months    Recent Outpatient Visits           3 months ago Gross hematuria   [facility] [name], PA-C   6 months ago Type 2 diabetes mellitus with diabetic polyneuropathy, without long-term current use of insulin [facility])   [employer] Jerrol Banana., MD   10 months ago Type 2 diabetes mellitus with diabetic polyneuropathy, without long-term current use of insulin [facility])   [facility] Jerrol Banana., MD   1 year ago Type 2 diabetes mellitus with diabetic polyneuropathy, without long-term current use of insulin [facility])   [facility] Jerrol Banana., MD   1 year ago Type 2 diabetes mellitus with diabetic polyneuropathy, without  long-term current use of insulin [facility])   [facility] Jerrol Banana., MD       Future Appointments             Today [name], PA-C [facility], [place]   In 1 week [name], PA-C [facility], [place]

## 2021-07-28 NOTE — Progress Notes (Signed)
Established patient visit   Patient: Renee Carpenter   DOB: [birth year]   88 y.o. Female  MRN: [id number] Visit Date: 07/28/2021  Today's healthcare provider: [name], PA-C   Chief Complaint  Patient presents with   Hospitalization Follow-up   Subjective    HPI  Follow up Hospitalization [name] presents today with her caregiver. Patient was admitted to [facility] on 07/20/21 and discharged on 07/24/21. She was treated for hypoxia, shortness of breath for 3 weeks and worsening orthopnea. Patient was admitted for CHF. Echo completed 07/21/21 showed an EF of 25-30%. Treatment for this included starting patient on oral Lasix at $Remove'40mg'OLLYmpd$  and Toprol 12.$RemoveBefor'5mg'YMrJEuvCaqzU$  started by cardiology. She has already follow up with cardiology and has future appointments scheduled. Telephone follow up was done on unsuccessful on reaching patient, call made 07/27/21 She reports excellent compliance with treatment. She reports this condition is improved.  Admits to fatigue, weakness, and incontinence.  Denies any SOB, wheezing, increase in cough, peripheral edema, fevers, chills.  She also reports her voice sounds different to her, almost muffled. History of seasonal allergies. Denies other changes in hearing, wearing hearing aids. ----------------------------------------------------------------------------------------- -   Medications: Outpatient Medications Prior to Visit  Medication Sig   Accu-Chek FastClix Lancets MISC Just check first thing in the morning to ensure no low blood sugar.   albuterol (VENTOLIN HFA) 108 (90 Base) MCG/ACT inhaler INHALE 2 PUFFS INTO THE LUNGS EVERY 4 HOURS AS NEEDED FOR WHEEZING OR SHORTNESS OF BREATH   blood glucose meter kit and supplies KIT Just check first thing in the morning to ensure no low blood sugar.   Blood Glucose Monitoring Suppl (ACCU-CHEK GUIDE) w/Device KIT Check blood sugars daily as directed   carbidopa-levodopa (SINEMET IR) 25-100 MG tablet Take 1 tablet by  mouth 3 (three) times daily. Take 1 tablet three times a day   Coenzyme Q10 (CO Q10) 200 MG CAPS Take 1 capsule by mouth daily.   DULoxetine (CYMBALTA) 20 MG capsule Take 40 mg by mouth daily.   esomeprazole (NEXIUM) 40 MG capsule TAKE 1 CAPSULE BY MOUTH EVERY DAY (Patient taking differently: Take 40 mg by mouth daily. TAKE 1 CAPSULE BY MOUTH EVERY DAY)   furosemide (LASIX) 40 MG tablet Take 1 tablet (40 mg total) by mouth daily.   gabapentin (NEURONTIN) 600 MG tablet TAKE 1 TABLET(600 MG) BY MOUTH THREE TIMES DAILY (Patient taking differently: Take 600 mg by mouth 3 (three) times daily.)   glimepiride (AMARYL) 4 MG tablet TAKE 1 TABLET(4 MG) BY MOUTH DAILY BEFORE BREAKFAST   glucose blood (ACCU-CHEK GUIDE) test strip Just check first thing in the morning to ensure no low blood sugar.   hydroxypropyl methylcellulose (ISOPTO TEARS) 2.5 % ophthalmic solution Place 1 drop into both eyes 3 (three) times daily as needed. Dry eyes    MAGNESIUM-OXIDE 400 (241.3 Mg) MG tablet TAKE 1 TABLET BY MOUTH TWICE DAILY   metoprolol succinate (TOPROL-XL) 25 MG 24 hr tablet Take 0.5 tablets (12.5 mg total) by mouth daily.   MULTIPLE VITAMIN PO Take 1 tablet by mouth daily.   Probiotic Product (ALIGN PO) Take 1 capsule by mouth daily.   rOPINIRole (REQUIP) 0.5 MG tablet Take 0.5 mg by mouth 3 (three) times daily.   tamsulosin (FLOMAX) 0.4 MG CAPS capsule TAKE 1 CAPSULE(0.4 MG) BY MOUTH DAILY   No facility-administered medications prior to visit.    Review of Systems  Constitutional:  Positive for fatigue. Negative for fever.  Respiratory:  Negative  for cough and shortness of breath.   Cardiovascular:  Negative for chest pain and leg swelling.  Gastrointestinal:  Negative for abdominal pain.       Incontinence  Neurological:  Positive for weakness. Negative for dizziness and headaches.      Objective    BP 107/86    Pulse 81    Resp 15    Wt 156 lb 11.2 oz (71.1 kg)    SpO2 99%    BMI 26.08 kg/m     Physical Exam Constitutional:      General: She is awake.     Appearance: She is well-developed.  HENT:     Head: Normocephalic.     Right Ear: Tympanic membrane normal.     Left Ear: Tympanic membrane normal.  Eyes:     Conjunctiva/sclera: Conjunctivae normal.  Cardiovascular:     Rate and Rhythm: Normal rate and regular rhythm.     Heart sounds: Normal heart sounds.  Pulmonary:     Effort: Pulmonary effort is normal.     Breath sounds: Normal breath sounds.  Musculoskeletal:     Right lower leg: No edema.     Left lower leg: No edema.  Skin:    General: Skin is warm.  Neurological:     Mental Status: She is alert and oriented to person, place, and time.  Psychiatric:        Attention and Perception: Attention normal.        Mood and Affect: Mood normal.        Speech: Speech normal.        Behavior: Behavior is cooperative.    No results found for any visits on 07/28/21.  Assessment & Plan     Problem List Items Addressed This Visit       Cardiovascular and Mediastinum   Chronic systolic CHF (congestive heart failure), NYHA class 3 ([place]) - Primary    Stable and symptomatically improved in office. Continue to follow with cardiology      Relevant Orders   AMB Referral to [facility]     Respiratory   Seasonal allergic rhinitis    rx flonase, advised one spray in each nostril once daily      Relevant Medications   fluticasone (FLONASE) 50 MCG/ACT nasal spray     Other   Incontinence of feces    Discussed need to upgrade current pad to a depends to avoid frequent clean ups.  Per pt has been occurring for a long time.  Pt wonders if insurance may provide what she needs      Relevant Orders   AMB Referral to [place]   Other Visit Diagnoses     Type 2 diabetes mellitus with diabetic polyneuropathy, without long-term current use of insulin ([place])       Relevant Orders   AMB Referral to [place]         Return in about 3 months (around 10/25/2021) for DMII, hypertension, heart failure.     I, Renee Kirschner, PA-C have reviewed all documentation for this visit. The documentation on  2/21/2023for the exam, diagnosis, procedures, and orders are all accurate and complete.  I,Britiney Blahnik,acting as a Education administrator for [employer], PA-C.,have documented all relevant documentation on the behalf of Renee Kirschner, PA-C,as directed by  Renee Kirschner, PA-C while in the presence of Renee Kirschner, PA-C.   Renee Kirschner, PA-C  [facility] [phone] (phone) [phone] (fax)  [place]

## 2021-07-28 NOTE — Assessment & Plan Note (Signed)
rx flonase, advised one spray in each nostril once daily

## 2021-07-29 ENCOUNTER — Telehealth: Payer: Self-pay | Admitting: *Deleted

## 2021-07-29 NOTE — Chronic Care Management (AMB) (Signed)
°  Chronic Care Management   Outreach Note  07/29/2021 Name: Renee Carpenter MRN: [id number] DOB: 03/08/1926  Renee Carpenter is a 88 y.o. year old female who is a primary care patient of Maple Hudson., MD. I reached out to Phebe Colla by phone today in response to a referral sent by Renee. Renee Carpenter's primary care provider.  Asked to speak with Renee Carpenter and was told pt was deceased   Will forward note to provider    Burman Nieves, CCMA Care Guide, Embedded Care Coordination [facility]   Care Management  Direct Dial: [phone]

## 2021-08-05 NOTE — Progress Notes (Deleted)
? ?Established Patient Office Visit ? ?Subjective:  ?Patient ID: Renee Carpenter, female    DOB: 1925-07-24  Age: 88 y.o. MRN: [id number] ? ?CC: No chief complaint on file. ? ? ?HPI ?Renee Carpenter presents for hospitalization follow-up. ?Follow up Hospitalization ? ?Patient was admitted to [facility] on 07/20/21 and discharged on 07/24/21. ?She was treated for shortness of breath. ?Treatment for this included Given elevated BNP and chest x-ray concerning for CHF we will give 1 dose of IV Lasix.  Will discuss with hospital team for admission given patient is on 4 L of oxygen. ?Telephone follow up was done on 07/29/21 ?She reports {excellent/good/fair:19992} compliance with treatment. ?She reports this condition is {resolved/improved/worsened:23923}. ? ?----------------------------------------------------------------------------------------- - ? ? ?Past Medical History:  ?Diagnosis Date  ? Anemia   ? WAS ANEMIC  ? Arthritis   ? Blood transfusion   ? HAD 1 SEVERAL YRS AGO...3-4 YR  ? CHF (congestive heart failure) ([place])   ? Chronic kidney disease   ? SOME DECREASE IN KIDNEY FUNCTION  ? Diabetes mellitus without complication ([place])   ? Hypertension   ? Normal cardiac stress test   ? REQUESTING [place]  ? Normal echocardiogram   ? REQUESTING THAT RESULT -- DR [name]   ? PONV (postoperative nausea and vomiting)   ? Shortness of breath   ? Sleep apnea   ? 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 ? ?Past Surgical History:  ?Procedure Laterality Date  ? ABDOMINAL HYSTERECTOMY    ? BACK SURGERY    ? 2 CERVICAL SURGERIES ALSO  ? CATARACTS    ? RIGHT EYE CATARACT  ? CERVICAL DISCECTOMY  08/1995 and 2011  ? C4-,C5-6, C6-7 anterior cervical; Dr. Ellene Route fusion and plating  ? LAMINECTOMY    ? decompressive; Dr. Burman Riis in [place] MICRODISCECTOMY  04/14/2011  ? Procedure: LUMBAR LAMINECTOMY/DECOMPRESSION MICRODISCECTOMY;  Surgeon: [name];  Location: [place] NEURO ORS;  Service: Neurosurgery;   Laterality: N/A;  Lumbar Three and Lumbar Four Laminectomy  ? TONSILLECTOMY AND ADENOIDECTOMY  1939  ? ? ?Family History  ?Problem Relation Age of Onset  ? Heart failure Father   ? Heart disease Father   ? Hypertension Mother   ? Diabetes Mother   ? Heart disease Mother   ? Cancer Sister   ?     breast  ? Breast cancer Sister 24  ? Cancer Maternal Grandfather   ? Cancer Maternal Aunt   ?     x3  ? Breast cancer Daughter 1  ? ? ?Social History  ? ?Socioeconomic History  ? Marital status: Widowed  ?  Spouse name: Not on file  ? Number of children: 1  ? Years of education: Not on file  ? Highest education level: Bachelor's degree (e.g., BA, AB, BS)  ?Occupational History  ? Occupation: retired  ?Tobacco Use  ? Smoking status: Never  ? Smokeless tobacco: Never  ?Substance and Sexual Activity  ? Alcohol use: No  ? Drug use: No  ? Sexual activity: Never  ?Other Topics Concern  ? Not on file  ?Social History Narrative  ? Not on file  ? ?Social Determinants of Health  ? ?Financial Resource Strain: Not on file  ?Food Insecurity: Not on file  ?Transportation Needs: Not on file  ?Physical Activity: Not on file  ?Stress: Not on file  ?Social Connections: Not on file  ?Intimate Partner Violence: Not on file  ? ? ?Outpatient Medications Prior to Visit  ?Medication  Sig Dispense Refill  ? Accu-Chek FastClix Lancets MISC Just check first thing in the morning to ensure no low blood sugar. 102 each 1  ? albuterol (VENTOLIN HFA) 108 (90 Base) MCG/ACT inhaler INHALE 2 PUFFS INTO THE LUNGS EVERY 4 HOURS AS NEEDED FOR WHEEZING OR SHORTNESS OF BREATH 6.7 g 3  ? blood glucose meter kit and supplies KIT Just check first thing in the morning to ensure no low blood sugar. 1 each 0  ? Blood Glucose Monitoring Suppl (ACCU-CHEK GUIDE) w/Device KIT Check blood sugars daily as directed 1 kit 0  ? carbidopa-levodopa (SINEMET IR) 25-100 MG tablet Take 1 tablet by mouth 3 (three) times daily. Take 1 tablet three times a day    ? Coenzyme Q10 (CO Q10)  200 MG CAPS Take 1 capsule by mouth daily.    ? DULoxetine (CYMBALTA) 20 MG capsule Take 40 mg by mouth daily.    ? DULoxetine (CYMBALTA) 30 MG capsule TAKE 1 CAPSULE(30 MG) BY MOUTH DAILY 30 capsule 2  ? esomeprazole (NEXIUM) 40 MG capsule TAKE 1 CAPSULE BY MOUTH EVERY DAY (Patient taking differently: Take 40 mg by mouth daily. TAKE 1 CAPSULE BY MOUTH EVERY DAY) 90 capsule 1  ? fluticasone (FLONASE) 50 MCG/ACT nasal spray Place 2 sprays into both nostrils daily. 16 g 6  ? furosemide (LASIX) 40 MG tablet Take 1 tablet (40 mg total) by mouth daily. 30 tablet 2  ? gabapentin (NEURONTIN) 600 MG tablet TAKE 1 TABLET(600 MG) BY MOUTH THREE TIMES DAILY (Patient taking differently: Take 600 mg by mouth 3 (three) times daily.) 270 tablet 3  ? glimepiride (AMARYL) 4 MG tablet TAKE 1 TABLET(4 MG) BY MOUTH DAILY BEFORE BREAKFAST 90 tablet 1  ? glucose blood (ACCU-CHEK GUIDE) test strip Just check first thing in the morning to ensure no low blood sugar. 100 strip 1  ? hydroxypropyl methylcellulose (ISOPTO TEARS) 2.5 % ophthalmic solution Place 1 drop into both eyes 3 (three) times daily as needed. Dry eyes     ? MAGNESIUM-OXIDE 400 (241.3 Mg) MG tablet TAKE 1 TABLET BY MOUTH TWICE DAILY 60 tablet 11  ? metoprolol succinate (TOPROL-XL) 25 MG 24 hr tablet Take 0.5 tablets (12.5 mg total) by mouth daily. 15 tablet 2  ? MULTIPLE VITAMIN PO Take 1 tablet by mouth daily.    ? Probiotic Product (ALIGN PO) Take 1 capsule by mouth daily.    ? rOPINIRole (REQUIP) 0.5 MG tablet Take 0.5 mg by mouth 3 (three) times daily.    ? tamsulosin (FLOMAX) 0.4 MG CAPS capsule TAKE 1 CAPSULE(0.4 MG) BY MOUTH DAILY 90 capsule 3  ? ?No facility-administered medications prior to visit.  ? ? ?Allergies  ?Allergen Reactions  ? Acetaminophen Itching and Other (See Comments)  ?  Stomach upset  ? Aspirin Itching and Other (See Comments)  ?  Stomach pain  ? Metformin And Related   ?  nausea  ? Other Other (See Comments)  ?  Novocaine, strange feeling  ? Sulfa  Antibiotics Other (See Comments)  ? Sulfasalazine   ?  Other reaction(s): Other (See Comments)  ? Tizanidine   ?  Diarrhea, almost passed out, felt dizzy and confused  ? Caffeine Palpitations  ? Ginger Palpitations  ? Ibuprofen Rash  ? ? ?ROS ?Review of Systems ? ?  ?Objective:  ?  ?Physical Exam ? ?There were no vitals taken for this visit. ?Wt Readings from Last 3 Encounters:  ?07/28/21 156 lb 11.2 oz (71.1 kg)  ?07/24/21  157 lb 3 oz (71.3 kg)  ?07/17/21 171 lb 6.4 oz (77.7 kg)  ? ? ? ?Health Maintenance Due  ?Topic Date Due  ? URINE MICROALBUMIN  Never done  ? Zoster Vaccines- Shingrix (1 of 2) Never done  ? Pneumonia Vaccine 57+ Years old (2 - PPSV23 if available, else PCV20) 04/28/2016  ? FOOT EXAM  05/23/2018  ? COVID-19 Vaccine (4 - Booster for Pfizer series) 12/12/2020  ? ? ?There are no preventive care reminders to display for this patient. ? ?Lab Results  ?Component Value Date  ? TSH 2.680 01/24/2019  ? ?Lab Results  ?Component Value Date  ? WBC 6.8 07/24/2021  ? HGB 13.7 07/24/2021  ? HCT 40.7 07/24/2021  ? MCV 117.3 (H) 07/24/2021  ? PLT 266 07/24/2021  ? ?Lab Results  ?Component Value Date  ? NA 140 07/24/2021  ? K 3.8 07/24/2021  ? CO2 32 07/24/2021  ? GLUCOSE 145 (H) 07/24/2021  ? BUN 26 (H) 07/24/2021  ? CREATININE 1.37 (H) 07/24/2021  ? BILITOT 1.4 (H) 07/21/2021  ? ALKPHOS 61 07/21/2021  ? AST 27 07/21/2021  ? ALT 8 07/21/2021  ? PROT 6.2 (L) 07/21/2021  ? ALBUMIN 3.2 (L) 07/21/2021  ? CALCIUM 9.3 07/24/2021  ? ANIONGAP 9 07/24/2021  ? EGFR 35 (L) 04/24/2021  ? ?Lab Results  ?Component Value Date  ? CHOL 149 08/26/2017  ? ?Lab Results  ?Component Value Date  ? HDL 47 08/26/2017  ? ?Lab Results  ?Component Value Date  ? [place] 74 08/26/2017  ? ?Lab Results  ?Component Value Date  ? TRIG 141 08/26/2017  ? ?Lab Results  ?Component Value Date  ? CHOLHDL 3.2 08/26/2017  ? ?Lab Results  ?Component Value Date  ? HGBA1C 7.7 (H) 07/21/2021  ? ? ?  ?Assessment & Plan:  ? ?Problem List Items Addressed This  Visit   ?None ? ? ?No orders of the defined types were placed in this encounter. ? ? ?Follow-up: No follow-ups on file.  ? ? ?Beverlee Nims, [place] ?

## 2021-08-06 ENCOUNTER — Inpatient Hospital Stay: Payer: Medicare PPO | Admitting: Physician Assistant

## 2021-08-10 ENCOUNTER — Inpatient Hospital Stay: Payer: Medicare PPO | Admitting: Family Medicine

## 2021-08-24 DIAGNOSIS — E1140 Type 2 diabetes mellitus with diabetic neuropathy, unspecified: Secondary | ICD-10-CM | POA: Diagnosis not present

## 2021-08-24 DIAGNOSIS — B351 Tinea unguium: Secondary | ICD-10-CM | POA: Diagnosis not present

## 2021-08-26 DIAGNOSIS — Z9181 History of falling: Secondary | ICD-10-CM | POA: Diagnosis not present

## 2021-08-26 DIAGNOSIS — I5043 Acute on chronic combined systolic (congestive) and diastolic (congestive) heart failure: Secondary | ICD-10-CM | POA: Diagnosis not present

## 2021-08-26 DIAGNOSIS — M6281 Muscle weakness (generalized): Secondary | ICD-10-CM | POA: Diagnosis not present

## 2021-08-26 DIAGNOSIS — G20 Parkinson's disease: Secondary | ICD-10-CM | POA: Diagnosis not present

## 2021-08-26 DIAGNOSIS — R2681 Unsteadiness on feet: Secondary | ICD-10-CM | POA: Diagnosis not present

## 2021-08-26 DIAGNOSIS — J9601 Acute respiratory failure with hypoxia: Secondary | ICD-10-CM | POA: Diagnosis not present

## 2021-08-26 DIAGNOSIS — I5041 Acute combined systolic (congestive) and diastolic (congestive) heart failure: Secondary | ICD-10-CM | POA: Diagnosis not present

## 2021-08-26 DIAGNOSIS — R279 Unspecified lack of coordination: Secondary | ICD-10-CM | POA: Diagnosis not present

## 2021-08-26 DIAGNOSIS — Z741 Need for assistance with personal care: Secondary | ICD-10-CM | POA: Diagnosis not present

## 2021-08-26 DIAGNOSIS — R262 Difficulty in walking, not elsewhere classified: Secondary | ICD-10-CM | POA: Diagnosis not present

## 2021-08-28 DIAGNOSIS — M6281 Muscle weakness (generalized): Secondary | ICD-10-CM | POA: Diagnosis not present

## 2021-08-28 DIAGNOSIS — Z741 Need for assistance with personal care: Secondary | ICD-10-CM | POA: Diagnosis not present

## 2021-08-28 DIAGNOSIS — I5043 Acute on chronic combined systolic (congestive) and diastolic (congestive) heart failure: Secondary | ICD-10-CM | POA: Diagnosis not present

## 2021-08-28 DIAGNOSIS — J9601 Acute respiratory failure with hypoxia: Secondary | ICD-10-CM | POA: Diagnosis not present

## 2021-08-28 DIAGNOSIS — Z9181 History of falling: Secondary | ICD-10-CM | POA: Diagnosis not present

## 2021-08-28 DIAGNOSIS — G20 Parkinson's disease: Secondary | ICD-10-CM | POA: Diagnosis not present

## 2021-08-28 DIAGNOSIS — R279 Unspecified lack of coordination: Secondary | ICD-10-CM | POA: Diagnosis not present

## 2021-08-28 DIAGNOSIS — R262 Difficulty in walking, not elsewhere classified: Secondary | ICD-10-CM | POA: Diagnosis not present

## 2021-08-28 DIAGNOSIS — R2681 Unsteadiness on feet: Secondary | ICD-10-CM | POA: Diagnosis not present

## 2021-08-28 DIAGNOSIS — I5041 Acute combined systolic (congestive) and diastolic (congestive) heart failure: Secondary | ICD-10-CM | POA: Diagnosis not present

## 2021-08-31 DIAGNOSIS — R2681 Unsteadiness on feet: Secondary | ICD-10-CM | POA: Diagnosis not present

## 2021-08-31 DIAGNOSIS — Z741 Need for assistance with personal care: Secondary | ICD-10-CM | POA: Diagnosis not present

## 2021-08-31 DIAGNOSIS — J9601 Acute respiratory failure with hypoxia: Secondary | ICD-10-CM | POA: Diagnosis not present

## 2021-08-31 DIAGNOSIS — I5043 Acute on chronic combined systolic (congestive) and diastolic (congestive) heart failure: Secondary | ICD-10-CM | POA: Diagnosis not present

## 2021-08-31 DIAGNOSIS — M6281 Muscle weakness (generalized): Secondary | ICD-10-CM | POA: Diagnosis not present

## 2021-08-31 DIAGNOSIS — G20 Parkinson's disease: Secondary | ICD-10-CM | POA: Diagnosis not present

## 2021-08-31 DIAGNOSIS — Z9181 History of falling: Secondary | ICD-10-CM | POA: Diagnosis not present

## 2021-08-31 DIAGNOSIS — R262 Difficulty in walking, not elsewhere classified: Secondary | ICD-10-CM | POA: Diagnosis not present

## 2021-08-31 DIAGNOSIS — R279 Unspecified lack of coordination: Secondary | ICD-10-CM | POA: Diagnosis not present

## 2021-08-31 DIAGNOSIS — I5041 Acute combined systolic (congestive) and diastolic (congestive) heart failure: Secondary | ICD-10-CM | POA: Diagnosis not present

## 2021-09-04 DIAGNOSIS — R1312 Dysphagia, oropharyngeal phase: Secondary | ICD-10-CM | POA: Diagnosis not present

## 2021-09-04 DIAGNOSIS — M6281 Muscle weakness (generalized): Secondary | ICD-10-CM | POA: Diagnosis not present

## 2021-09-04 DIAGNOSIS — R262 Difficulty in walking, not elsewhere classified: Secondary | ICD-10-CM | POA: Diagnosis not present

## 2021-09-04 DIAGNOSIS — I5041 Acute combined systolic (congestive) and diastolic (congestive) heart failure: Secondary | ICD-10-CM | POA: Diagnosis not present

## 2021-09-04 DIAGNOSIS — G20 Parkinson's disease: Secondary | ICD-10-CM | POA: Diagnosis not present

## 2021-09-04 DIAGNOSIS — Z741 Need for assistance with personal care: Secondary | ICD-10-CM | POA: Diagnosis not present

## 2021-09-04 DIAGNOSIS — R279 Unspecified lack of coordination: Secondary | ICD-10-CM | POA: Diagnosis not present

## 2021-09-04 DIAGNOSIS — R499 Unspecified voice and resonance disorder: Secondary | ICD-10-CM | POA: Diagnosis not present

## 2021-09-04 DIAGNOSIS — J9601 Acute respiratory failure with hypoxia: Secondary | ICD-10-CM | POA: Diagnosis not present

## 2021-09-04 DIAGNOSIS — Z9181 History of falling: Secondary | ICD-10-CM | POA: Diagnosis not present

## 2021-09-04 DIAGNOSIS — R2681 Unsteadiness on feet: Secondary | ICD-10-CM | POA: Diagnosis not present

## 2021-09-04 DIAGNOSIS — I5043 Acute on chronic combined systolic (congestive) and diastolic (congestive) heart failure: Secondary | ICD-10-CM | POA: Diagnosis not present

## 2021-09-04 DIAGNOSIS — R41841 Cognitive communication deficit: Secondary | ICD-10-CM | POA: Diagnosis not present

## 2021-09-07 DIAGNOSIS — I340 Nonrheumatic mitral (valve) insufficiency: Secondary | ICD-10-CM | POA: Diagnosis not present

## 2021-09-07 DIAGNOSIS — I10 Essential (primary) hypertension: Secondary | ICD-10-CM | POA: Diagnosis not present

## 2021-09-07 DIAGNOSIS — G4733 Obstructive sleep apnea (adult) (pediatric): Secondary | ICD-10-CM | POA: Diagnosis not present

## 2021-09-07 DIAGNOSIS — N1832 Chronic kidney disease, stage 3b: Secondary | ICD-10-CM | POA: Diagnosis not present

## 2021-09-07 DIAGNOSIS — I5043 Acute on chronic combined systolic (congestive) and diastolic (congestive) heart failure: Secondary | ICD-10-CM | POA: Diagnosis not present

## 2021-09-07 DIAGNOSIS — Z794 Long term (current) use of insulin: Secondary | ICD-10-CM | POA: Diagnosis not present

## 2021-09-07 DIAGNOSIS — E119 Type 2 diabetes mellitus without complications: Secondary | ICD-10-CM | POA: Diagnosis not present

## 2021-09-08 ENCOUNTER — Encounter: Payer: Self-pay | Admitting: Family

## 2021-09-08 ENCOUNTER — Ambulatory Visit: Payer: Medicare PPO | Attending: Family | Admitting: Family

## 2021-09-08 VITALS — BP 110/58 | HR 80 | Resp 16 | Ht 64.0 in | Wt 152.5 lb

## 2021-09-08 DIAGNOSIS — R41841 Cognitive communication deficit: Secondary | ICD-10-CM | POA: Diagnosis not present

## 2021-09-08 DIAGNOSIS — R531 Weakness: Secondary | ICD-10-CM | POA: Diagnosis not present

## 2021-09-08 DIAGNOSIS — M792 Neuralgia and neuritis, unspecified: Secondary | ICD-10-CM | POA: Diagnosis not present

## 2021-09-08 DIAGNOSIS — R059 Cough, unspecified: Secondary | ICD-10-CM | POA: Insufficient documentation

## 2021-09-08 DIAGNOSIS — N189 Chronic kidney disease, unspecified: Secondary | ICD-10-CM | POA: Diagnosis not present

## 2021-09-08 DIAGNOSIS — R2681 Unsteadiness on feet: Secondary | ICD-10-CM | POA: Diagnosis not present

## 2021-09-08 DIAGNOSIS — R0789 Other chest pain: Secondary | ICD-10-CM | POA: Insufficient documentation

## 2021-09-08 DIAGNOSIS — H538 Other visual disturbances: Secondary | ICD-10-CM | POA: Diagnosis not present

## 2021-09-08 DIAGNOSIS — Z7901 Long term (current) use of anticoagulants: Secondary | ICD-10-CM | POA: Diagnosis not present

## 2021-09-08 DIAGNOSIS — I130 Hypertensive heart and chronic kidney disease with heart failure and stage 1 through stage 4 chronic kidney disease, or unspecified chronic kidney disease: Secondary | ICD-10-CM | POA: Diagnosis not present

## 2021-09-08 DIAGNOSIS — I5043 Acute on chronic combined systolic (congestive) and diastolic (congestive) heart failure: Secondary | ICD-10-CM | POA: Diagnosis not present

## 2021-09-08 DIAGNOSIS — G8929 Other chronic pain: Secondary | ICD-10-CM | POA: Diagnosis not present

## 2021-09-08 DIAGNOSIS — G4733 Obstructive sleep apnea (adult) (pediatric): Secondary | ICD-10-CM | POA: Insufficient documentation

## 2021-09-08 DIAGNOSIS — I10 Essential (primary) hypertension: Secondary | ICD-10-CM | POA: Diagnosis not present

## 2021-09-08 DIAGNOSIS — I5041 Acute combined systolic (congestive) and diastolic (congestive) heart failure: Secondary | ICD-10-CM | POA: Diagnosis not present

## 2021-09-08 DIAGNOSIS — Z741 Need for assistance with personal care: Secondary | ICD-10-CM | POA: Diagnosis not present

## 2021-09-08 DIAGNOSIS — G20 Parkinson's disease: Secondary | ICD-10-CM | POA: Diagnosis not present

## 2021-09-08 DIAGNOSIS — Z7984 Long term (current) use of oral hypoglycemic drugs: Secondary | ICD-10-CM | POA: Diagnosis not present

## 2021-09-08 DIAGNOSIS — E1122 Type 2 diabetes mellitus with diabetic chronic kidney disease: Secondary | ICD-10-CM | POA: Diagnosis not present

## 2021-09-08 DIAGNOSIS — R1312 Dysphagia, oropharyngeal phase: Secondary | ICD-10-CM | POA: Diagnosis not present

## 2021-09-08 DIAGNOSIS — R262 Difficulty in walking, not elsewhere classified: Secondary | ICD-10-CM | POA: Diagnosis not present

## 2021-09-08 DIAGNOSIS — I5022 Chronic systolic (congestive) heart failure: Secondary | ICD-10-CM | POA: Insufficient documentation

## 2021-09-08 DIAGNOSIS — R42 Dizziness and giddiness: Secondary | ICD-10-CM | POA: Insufficient documentation

## 2021-09-08 DIAGNOSIS — M6281 Muscle weakness (generalized): Secondary | ICD-10-CM | POA: Diagnosis not present

## 2021-09-08 DIAGNOSIS — R499 Unspecified voice and resonance disorder: Secondary | ICD-10-CM | POA: Diagnosis not present

## 2021-09-08 DIAGNOSIS — E1136 Type 2 diabetes mellitus with diabetic cataract: Secondary | ICD-10-CM | POA: Insufficient documentation

## 2021-09-08 DIAGNOSIS — Z9989 Dependence on other enabling machines and devices: Secondary | ICD-10-CM | POA: Insufficient documentation

## 2021-09-08 DIAGNOSIS — R0602 Shortness of breath: Secondary | ICD-10-CM | POA: Diagnosis not present

## 2021-09-08 DIAGNOSIS — Z9181 History of falling: Secondary | ICD-10-CM | POA: Diagnosis not present

## 2021-09-08 DIAGNOSIS — J9601 Acute respiratory failure with hypoxia: Secondary | ICD-10-CM | POA: Diagnosis not present

## 2021-09-08 DIAGNOSIS — N1832 Chronic kidney disease, stage 3b: Secondary | ICD-10-CM

## 2021-09-08 DIAGNOSIS — R279 Unspecified lack of coordination: Secondary | ICD-10-CM | POA: Diagnosis not present

## 2021-09-08 NOTE — Patient Instructions (Addendum)
Begin weighing daily  (using your walker) and call for an overnight weight gain of 3 pounds or more or a weekly weight gain of more than 5 pounds. ? ? ? ?Wear your CPAP anytime you are asleep.  ? ? ? ?

## 2021-09-08 NOTE — Progress Notes (Signed)
? Patient ID: Renee Carpenter, female    DOB: 08-21-1925, 88 y.o.   MRN: [id number] ? ?HPI ? ?Renee Carpenter is a 88 y/o female with a history of DM, HTN, CKD, anemia, sleep apnea, spondylolysis and chronic heart failure.  ? ?Echo report from 07/21/21 reviewed and showed an EF of 25-30% along with moderate LAE, moderate/severe MR and mild/moderate TR.  ? ?Admitted 07/20/21 due to generalized weakness, bilateral leg swelling and difficulty breathing due to HF exacerbation. Initially given IV lasix with transition to oral diuretics. Cardiology consult obtained. Elevated troponin thought to be due to demand ischemia. PT/OT evaluations done. Losartan held due to CKD. Discharged after 4 days.  ? ?She presents today for a follow-up visit with a chief complaint of minimal shortness of breath upon moderate exertion. She describes this as chronic in nature having been present for years. She has associated fatigue, blurry vision, chest tightness, dry cough, light-headedness, weakness and chronic pain along with this. She denies any difficulty sleeping, abdominal distention, palpitations, pedal edema or chest pain.  ? ?Just had jardiance started by cardiology and she is going to start taking it tomorrow.  ? ?Past Medical History:  ?Diagnosis Date  ? Anemia   ? WAS ANEMIC  ? Arthritis   ? Blood transfusion   ? HAD 1 SEVERAL YRS AGO...3-4 YR  ? CHF (congestive heart failure) ([facility])   ? Chronic kidney disease   ? SOME DECREASE IN KIDNEY FUNCTION  ? Diabetes mellitus without complication ([facility])   ? Hypertension   ? Normal cardiac stress test   ? REQUESTING COPY FROM [facility]  ? Normal echocardiogram   ? REQUESTING THAT RESULT -- DR Park Breed   ? PONV (postoperative nausea and vomiting)   ? Shortness of breath   ? Sleep apnea   ? 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 ?Past Surgical History:  ?Procedure Laterality Date  ? ABDOMINAL HYSTERECTOMY    ? BACK SURGERY    ? 2 CERVICAL SURGERIES ALSO  ? CATARACTS    ? RIGHT EYE CATARACT  ? CERVICAL  DISCECTOMY  08/1995 and 2011  ? C4-,C5-6, C6-7 anterior cervical; Dr. Danielle Dess fusion and plating  ? LAMINECTOMY    ? decompressive; [facility]. Polo Riley [facility]  ? LUMBAR LAMINECTOMY/DECOMPRESSION MICRODISCECTOMY  04/14/2011  ? Procedure: LUMBAR LAMINECTOMY/DECOMPRESSION MICRODISCECTOMY;  Surgeon: Cristi Loron;  Location: [facility];  Service: Neurosurgery;  Laterality: N/A;  Lumbar Three and Lumbar Four Laminectomy  ? TONSILLECTOMY AND ADENOIDECTOMY  1939  ? ?Family History  ?Problem Relation Age of Onset  ? Heart failure Father   ? Heart disease Father   ? Hypertension Mother   ? Diabetes Mother   ? Heart disease Mother   ? Cancer Sister   ?     breast  ? Breast cancer Sister 25  ? Cancer Maternal Grandfather   ? Cancer Maternal Aunt   ?     x3  ? Breast cancer Daughter 63  ? ?Social History  ? ?Tobacco Use  ? Smoking status: Never  ? Smokeless tobacco: Never  ?Substance Use Topics  ? Alcohol use: No  ? ?Allergies  ?Allergen Reactions  ? Acetaminophen Itching and Other (See Comments)  ?  Stomach upset  ? Aspirin Itching and Other (See Comments)  ?  Stomach pain  ? Metformin And Related   ?  nausea  ? Other Other (See Comments)  ?  Novocaine, strange feeling  ? Sulfa Antibiotics Other (See Comments)  ? Sulfasalazine   ?  Other reaction(s): Other (See Comments)  ? Tizanidine   ?  Diarrhea, almost passed out, felt dizzy and confused  ? Caffeine Palpitations  ? Ginger Palpitations  ? Ibuprofen Rash  ? ?Vitals:  ? 09/08/21 1243  ?BP: (!) 110/58  ?Pulse: 80  ?Resp: 16  ?SpO2: 96%  ?Weight: 152 lb 8 oz (69.2 kg)  ?Height: 5\' 4"  (1.626 m)  ? ?Wt Readings from Last 3 Encounters:  ?09/08/21 152 lb 8 oz (69.2 kg)  ?07/28/21 156 lb 11.2 oz (71.1 kg)  ?07/24/21 157 lb 3 oz (71.3 kg)  ? ? ?Review of Systems  ?Constitutional:  Positive for fatigue. Negative for appetite change.  ?HENT:  Positive for hearing loss. Negative for congestion and sore throat.   ?Eyes:  Positive for visual disturbance (cloudy vision).  ?Respiratory:   Positive for cough (dry cough), chest tightness and shortness of breath ("little bit").   ?Cardiovascular:  Negative for chest pain, palpitations and leg swelling.  ?Gastrointestinal:  Negative for abdominal distention and abdominal pain.  ?Endocrine: Negative.   ?Genitourinary: Negative.   ?Musculoskeletal:  Positive for back pain and neck pain (at times).  ?Skin: Negative.   ?Allergic/Immunologic: Negative.   ?Neurological:  Positive for weakness and light-headedness. Negative for dizziness.  ?     Neuropathy  ?Hematological:  Negative for adenopathy. Bruises/bleeds easily.  ?Psychiatric/Behavioral:  Negative for dysphoric mood and sleep disturbance (sleeping on 1 pillow). The patient is not nervous/anxious.   ? ?Vitals:  ? 09/08/21 1243  ?BP: (!) 110/58  ?Pulse: 80  ?Resp: 16  ?SpO2: 96%  ?Weight: 152 lb 8 oz (69.2 kg)  ?Height: 5\' 4"  (1.626 m)  ? ?Wt Readings from Last 3 Encounters:  ?09/08/21 152 lb 8 oz (69.2 kg)  ?07/28/21 156 lb 11.2 oz (71.1 kg)  ?07/24/21 157 lb 3 oz (71.3 kg)  ? ?Lab Results  ?Component Value Date  ? CREATININE 1.37 (H) 07/24/2021  ? CREATININE 1.45 (H) 07/23/2021  ? CREATININE 1.47 (H) 07/22/2021  ? ?Physical Exam ?Vitals and nursing note reviewed. Exam conducted with a chaperone present (caregiver).  ?Constitutional:   ?   Appearance: Normal appearance.  ?HENT:  ?   Head: Normocephalic and atraumatic.  ?   Right Ear: Decreased hearing noted.  ?   Left Ear: Decreased hearing noted.  ?Cardiovascular:  ?   Rate and Rhythm: Normal rate and regular rhythm.  ?Pulmonary:  ?   Effort: Pulmonary effort is normal. No respiratory distress.  ?   Breath sounds: No wheezing or rales.  ?Abdominal:  ?   General: There is no distension.  ?   Palpations: Abdomen is soft.  ?   Tenderness: There is no abdominal tenderness.  ?Musculoskeletal:     ?   General: No tenderness.  ?   Cervical back: Normal range of motion and neck supple.  ?   Right lower leg: No edema.  ?   Left lower leg: No edema.  ?Skin: ?    General: Skin is warm and dry.  ?Neurological:  ?   General: No focal deficit present.  ?   Mental Status: She is alert and oriented to person, place, and time.  ?Psychiatric:     ?   Mood and Affect: Mood normal.     ?   Behavior: Behavior normal.     ?   Thought Content: Thought content normal.  ? ? ?Assessment & Plan: ? ?1: Chronic heart failure with reduced ejection fraction- ?- NYHA class II ?-  euvolemic today ?- weighed on our scale today and she was unable to do this last visit ?- says that she continues to be unsteady on her home scale; instructed to try using her walker to straddle the scale and she can use the arms of the walker to stabilize herself but only do it with the caregiver present ?- if able to do this, call for an overnight weight gain of > 2 pounds or a weekly weight gain of > 5 pounds ?- not adding salt to her food ?- likes to eat ice and reviewed how to quantify that; needs to measure how many ounces her cup at home holds so she can keep daily fluid intake to ~ 60-64 ounces ?- saw cardiology Mariana Kaufman) 09/07/21 and jardiance was ordered ?- on GDMT of metoprolol  ?- has picked up jardiance and is going to start taking this tomorrow morning; may be able to decrease her furosemide in the future ?- will check BMP next visit ?- BP may not be able to tolerate entresto/spironolactone ?- BNP 07/21/21 was 2091.0 ? ?2: HTN with CKD- ?- BP looks good (110/58) ?- saw PCP (Drubel) 07/28/21 ?- BMP 07/24/21 reviewed and showed sodium 140, potassium 3.8, creatinine 1.37 and GFR 36 ? ?3: DM- ?- A1c 07/21/21 was 7.7% ?- doesn't check her glucose consistently ? ?4: Obstructive sleep apnea- ?- saw pulmonology (Parrett) 07/17/21 ?- wearing CPAP a couple of hours at night but also sleeps during the day; encouraged to also wear it during the day if she's going to sleep for a couple of hours ? ?5: Neuropathy- ?- saw neurology [name]) 07/15/21 ? ? ?Medication list reviewed.  ? ?Return in 1 month for labs, sooner if needed.  ? ?

## 2021-09-11 DIAGNOSIS — J9601 Acute respiratory failure with hypoxia: Secondary | ICD-10-CM | POA: Diagnosis not present

## 2021-09-11 DIAGNOSIS — Z9181 History of falling: Secondary | ICD-10-CM | POA: Diagnosis not present

## 2021-09-11 DIAGNOSIS — M6281 Muscle weakness (generalized): Secondary | ICD-10-CM | POA: Diagnosis not present

## 2021-09-11 DIAGNOSIS — G20 Parkinson's disease: Secondary | ICD-10-CM | POA: Diagnosis not present

## 2021-09-11 DIAGNOSIS — Z741 Need for assistance with personal care: Secondary | ICD-10-CM | POA: Diagnosis not present

## 2021-09-11 DIAGNOSIS — R262 Difficulty in walking, not elsewhere classified: Secondary | ICD-10-CM | POA: Diagnosis not present

## 2021-09-11 DIAGNOSIS — I5041 Acute combined systolic (congestive) and diastolic (congestive) heart failure: Secondary | ICD-10-CM | POA: Diagnosis not present

## 2021-09-11 DIAGNOSIS — I5043 Acute on chronic combined systolic (congestive) and diastolic (congestive) heart failure: Secondary | ICD-10-CM | POA: Diagnosis not present

## 2021-09-11 DIAGNOSIS — R279 Unspecified lack of coordination: Secondary | ICD-10-CM | POA: Diagnosis not present

## 2021-09-11 DIAGNOSIS — R2681 Unsteadiness on feet: Secondary | ICD-10-CM | POA: Diagnosis not present

## 2021-09-14 DIAGNOSIS — L821 Other seborrheic keratosis: Secondary | ICD-10-CM | POA: Diagnosis not present

## 2021-09-14 DIAGNOSIS — X32XXXA Exposure to sunlight, initial encounter: Secondary | ICD-10-CM | POA: Diagnosis not present

## 2021-09-14 DIAGNOSIS — L570 Actinic keratosis: Secondary | ICD-10-CM | POA: Diagnosis not present

## 2021-09-14 DIAGNOSIS — D485 Neoplasm of uncertain behavior of skin: Secondary | ICD-10-CM | POA: Diagnosis not present

## 2021-09-14 DIAGNOSIS — C44529 Squamous cell carcinoma of skin of other part of trunk: Secondary | ICD-10-CM | POA: Diagnosis not present

## 2021-09-15 DIAGNOSIS — I5043 Acute on chronic combined systolic (congestive) and diastolic (congestive) heart failure: Secondary | ICD-10-CM | POA: Diagnosis not present

## 2021-09-15 DIAGNOSIS — R41841 Cognitive communication deficit: Secondary | ICD-10-CM | POA: Diagnosis not present

## 2021-09-15 DIAGNOSIS — R499 Unspecified voice and resonance disorder: Secondary | ICD-10-CM | POA: Diagnosis not present

## 2021-09-15 DIAGNOSIS — R1312 Dysphagia, oropharyngeal phase: Secondary | ICD-10-CM | POA: Diagnosis not present

## 2021-09-15 DIAGNOSIS — J9601 Acute respiratory failure with hypoxia: Secondary | ICD-10-CM | POA: Diagnosis not present

## 2021-09-15 DIAGNOSIS — M6281 Muscle weakness (generalized): Secondary | ICD-10-CM | POA: Diagnosis not present

## 2021-09-15 DIAGNOSIS — G20 Parkinson's disease: Secondary | ICD-10-CM | POA: Diagnosis not present

## 2021-09-15 DIAGNOSIS — R2681 Unsteadiness on feet: Secondary | ICD-10-CM | POA: Diagnosis not present

## 2021-09-15 DIAGNOSIS — R279 Unspecified lack of coordination: Secondary | ICD-10-CM | POA: Diagnosis not present

## 2021-09-15 DIAGNOSIS — Z741 Need for assistance with personal care: Secondary | ICD-10-CM | POA: Diagnosis not present

## 2021-09-15 DIAGNOSIS — I5041 Acute combined systolic (congestive) and diastolic (congestive) heart failure: Secondary | ICD-10-CM | POA: Diagnosis not present

## 2021-09-15 DIAGNOSIS — Z9181 History of falling: Secondary | ICD-10-CM | POA: Diagnosis not present

## 2021-09-15 DIAGNOSIS — R262 Difficulty in walking, not elsewhere classified: Secondary | ICD-10-CM | POA: Diagnosis not present

## 2021-09-22 DIAGNOSIS — Z9181 History of falling: Secondary | ICD-10-CM | POA: Diagnosis not present

## 2021-09-22 DIAGNOSIS — I5041 Acute combined systolic (congestive) and diastolic (congestive) heart failure: Secondary | ICD-10-CM | POA: Diagnosis not present

## 2021-09-22 DIAGNOSIS — Z741 Need for assistance with personal care: Secondary | ICD-10-CM | POA: Diagnosis not present

## 2021-09-22 DIAGNOSIS — I5043 Acute on chronic combined systolic (congestive) and diastolic (congestive) heart failure: Secondary | ICD-10-CM | POA: Diagnosis not present

## 2021-09-22 DIAGNOSIS — R262 Difficulty in walking, not elsewhere classified: Secondary | ICD-10-CM | POA: Diagnosis not present

## 2021-09-22 DIAGNOSIS — R279 Unspecified lack of coordination: Secondary | ICD-10-CM | POA: Diagnosis not present

## 2021-09-22 DIAGNOSIS — R2681 Unsteadiness on feet: Secondary | ICD-10-CM | POA: Diagnosis not present

## 2021-09-22 DIAGNOSIS — R499 Unspecified voice and resonance disorder: Secondary | ICD-10-CM | POA: Diagnosis not present

## 2021-09-22 DIAGNOSIS — J9601 Acute respiratory failure with hypoxia: Secondary | ICD-10-CM | POA: Diagnosis not present

## 2021-09-22 DIAGNOSIS — G20 Parkinson's disease: Secondary | ICD-10-CM | POA: Diagnosis not present

## 2021-09-22 DIAGNOSIS — M6281 Muscle weakness (generalized): Secondary | ICD-10-CM | POA: Diagnosis not present

## 2021-09-22 DIAGNOSIS — R41841 Cognitive communication deficit: Secondary | ICD-10-CM | POA: Diagnosis not present

## 2021-09-22 DIAGNOSIS — R1312 Dysphagia, oropharyngeal phase: Secondary | ICD-10-CM | POA: Diagnosis not present

## 2021-09-25 ENCOUNTER — Other Ambulatory Visit: Payer: Self-pay | Admitting: Family Medicine

## 2021-09-25 DIAGNOSIS — R262 Difficulty in walking, not elsewhere classified: Secondary | ICD-10-CM | POA: Diagnosis not present

## 2021-09-25 DIAGNOSIS — Z741 Need for assistance with personal care: Secondary | ICD-10-CM | POA: Diagnosis not present

## 2021-09-25 DIAGNOSIS — R2681 Unsteadiness on feet: Secondary | ICD-10-CM | POA: Diagnosis not present

## 2021-09-25 DIAGNOSIS — I5041 Acute combined systolic (congestive) and diastolic (congestive) heart failure: Secondary | ICD-10-CM | POA: Diagnosis not present

## 2021-09-25 DIAGNOSIS — Z9181 History of falling: Secondary | ICD-10-CM | POA: Diagnosis not present

## 2021-09-25 DIAGNOSIS — J9601 Acute respiratory failure with hypoxia: Secondary | ICD-10-CM | POA: Diagnosis not present

## 2021-09-25 DIAGNOSIS — M6281 Muscle weakness (generalized): Secondary | ICD-10-CM | POA: Diagnosis not present

## 2021-09-25 DIAGNOSIS — G20 Parkinson's disease: Secondary | ICD-10-CM | POA: Diagnosis not present

## 2021-09-25 DIAGNOSIS — I5043 Acute on chronic combined systolic (congestive) and diastolic (congestive) heart failure: Secondary | ICD-10-CM | POA: Diagnosis not present

## 2021-09-25 DIAGNOSIS — R279 Unspecified lack of coordination: Secondary | ICD-10-CM | POA: Diagnosis not present

## 2021-09-25 NOTE — Telephone Encounter (Signed)
Requested medication (s) are due for refill today - expired Rx ? ?Requested medication (s) are on the active medication list -yes ? ?Future visit scheduled -yes ? ?Last refill: 09/07/20 #90 3RF ? ?Notes to clinic: Request RF: expired Rx ? ?Requested Prescriptions  ?Pending Prescriptions Disp Refills  ? tamsulosin (FLOMAX) 0.4 MG CAPS capsule [Pharmacy Med Name: TAMSULOSIN 0.4MG  CAPSULES] 90 capsule 3  ?  Sig: TAKE 1 CAPSULE(0.4 MG) BY MOUTH DAILY  ?  ? Urology: Alpha-Adrenergic Blocker Failed - 09/25/2021 12:36 PM  ?  ?  Failed - PSA in normal range and within 360 days  ?  No results found for: LABPSA, PSA, PSA1, ULTRAPSA  ?  ?  ?  Passed - Last BP in normal range  ?  BP Readings from Last 1 Encounters:  ?09/08/21 (!) 110/58  ?  ?  ?  ?  Passed - Valid encounter within last 12 months  ?  Recent Outpatient Visits   ? ?      ? 1 month ago Chronic systolic CHF (congestive heart failure), NYHA class 3 (HCC)  ? [facility], Lillia Abed, PA-C  ? 5 months ago Gross hematuria  ? [facility], Lillia Abed, PA-C  ? 8 months ago Type 2 diabetes mellitus with diabetic polyneuropathy, without long-term current use of insulin (HCC)  ? [facility] Maple Hudson., MD  ? 12 months ago Type 2 diabetes mellitus with diabetic polyneuropathy, without long-term current use of insulin (HCC)  ? [facility] Maple Hudson., MD  ? 1 year ago Type 2 diabetes mellitus with diabetic polyneuropathy, without long-term current use of insulin (HCC)  ? [facility] Maple Hudson., MD  ? ?  ?  ?Future Appointments   ? ?        ? In 1 month Maple Hudson., MD [facility]  ? ?  ? ? ?  ?  ?  ? ? ? ?Requested Prescriptions  ?Pending Prescriptions Disp Refills  ? tamsulosin (FLOMAX) 0.4 MG CAPS capsule [Pharmacy Med Name: TAMSULOSIN 0.4MG  CAPSULES] 90 capsule 3  ?  Sig: TAKE 1 CAPSULE(0.4 MG) BY MOUTH DAILY  ?  ? Urology:  Alpha-Adrenergic Blocker Failed - 09/25/2021 12:36 PM  ?  ?  Failed - PSA in normal range and within 360 days  ?  No results found for: LABPSA, PSA, PSA1, ULTRAPSA  ?  ?  ?  Passed - Last BP in normal range  ?  BP Readings from Last 1 Encounters:  ?09/08/21 (!) 110/58  ?  ?  ?  ?  Passed - Valid encounter within last 12 months  ?  Recent Outpatient Visits   ? ?      ? 1 month ago Chronic systolic CHF (congestive heart failure), NYHA class 3 (HCC)  ? [facility], Lillia Abed, PA-C  ? 5 months ago Gross hematuria  ? [facility], Lillia Abed, PA-C  ? 8 months ago Type 2 diabetes mellitus with diabetic polyneuropathy, without long-term current use of insulin (HCC)  ? [facility] Maple Hudson., MD  ? 12 months ago Type 2 diabetes mellitus with diabetic polyneuropathy, without long-term current use of insulin (HCC)  ? [facility] Maple Hudson., MD  ? 1 year ago Type 2 diabetes mellitus with diabetic polyneuropathy, without long-term current use of insulin (HCC)  ? [facility] Sullivan Lone, Richard L  Montez Hageman., MD  ? ?  ?  ?Future Appointments   ? ?        ? In 1 month Maple Hudson., MD [facility]  ? ?  ? ? ?  ?  ?  ? ? ? ?

## 2021-09-29 DIAGNOSIS — Z741 Need for assistance with personal care: Secondary | ICD-10-CM | POA: Diagnosis not present

## 2021-09-29 DIAGNOSIS — I5043 Acute on chronic combined systolic (congestive) and diastolic (congestive) heart failure: Secondary | ICD-10-CM | POA: Diagnosis not present

## 2021-09-29 DIAGNOSIS — G20 Parkinson's disease: Secondary | ICD-10-CM | POA: Diagnosis not present

## 2021-09-29 DIAGNOSIS — R279 Unspecified lack of coordination: Secondary | ICD-10-CM | POA: Diagnosis not present

## 2021-09-29 DIAGNOSIS — Z9181 History of falling: Secondary | ICD-10-CM | POA: Diagnosis not present

## 2021-09-29 DIAGNOSIS — R2681 Unsteadiness on feet: Secondary | ICD-10-CM | POA: Diagnosis not present

## 2021-09-29 DIAGNOSIS — R262 Difficulty in walking, not elsewhere classified: Secondary | ICD-10-CM | POA: Diagnosis not present

## 2021-09-29 DIAGNOSIS — J9601 Acute respiratory failure with hypoxia: Secondary | ICD-10-CM | POA: Diagnosis not present

## 2021-09-29 DIAGNOSIS — M6281 Muscle weakness (generalized): Secondary | ICD-10-CM | POA: Diagnosis not present

## 2021-09-29 DIAGNOSIS — I5041 Acute combined systolic (congestive) and diastolic (congestive) heart failure: Secondary | ICD-10-CM | POA: Diagnosis not present

## 2021-10-06 ENCOUNTER — Ambulatory Visit: Payer: Self-pay | Admitting: *Deleted

## 2021-10-06 DIAGNOSIS — G608 Other hereditary and idiopathic neuropathies: Secondary | ICD-10-CM | POA: Diagnosis not present

## 2021-10-06 DIAGNOSIS — G4752 REM sleep behavior disorder: Secondary | ICD-10-CM | POA: Diagnosis not present

## 2021-10-06 DIAGNOSIS — G4733 Obstructive sleep apnea (adult) (pediatric): Secondary | ICD-10-CM | POA: Diagnosis not present

## 2021-10-06 DIAGNOSIS — G20 Parkinson's disease: Secondary | ICD-10-CM | POA: Diagnosis not present

## 2021-10-06 DIAGNOSIS — I959 Hypotension, unspecified: Secondary | ICD-10-CM | POA: Diagnosis not present

## 2021-10-06 DIAGNOSIS — G2581 Restless legs syndrome: Secondary | ICD-10-CM | POA: Diagnosis not present

## 2021-10-06 NOTE — Telephone Encounter (Signed)
?Chief Complaint: bilateral leg pain and cough / congestion requesting appt  ?Symptoms: c/o pain in bilateral legs from feet up legs left worse than right. Difficulty standing requires assist more than usual. C/o cough non productive/ congestion. Chest heaviness with coughing. Runny nose, poor appetite ?Frequency: since Monday  ?Pertinent Negatives: Patient denies chest pain, difficulty breathing no fever, no weakness on one side of body.  ?Disposition: [] ED /[] Urgent Care (no appt availability in office) / [x] Appointment(In office/virtual)/ []  [facility]/ [] Home Care/ [] Refused Recommended Disposition /[] [facility]/ []  Follow-up with PCP ?Additional Notes:  ? ?Appt scheduled for tomorrow . ?  ? ? ? ?Reason for Disposition ? [1] MODERATE pain (e.g., interferes with normal activities, limping) AND [2] present > 3 days ? [1] Continuous (nonstop) coughing interferes with work or school AND [2] no improvement using cough treatment per Care Advice ?   Does not have continuous coughing. ? ?Answer Assessment - Initial Assessment Questions ?1. ONSET: "When did the pain start?"  ?    A couple of days ago  ?2. LOCATION: "Where is the pain located?"  ?    Bilateral legs left weaker than right  ?3. PAIN: "How bad is the pain?"    (Scale 1-10; or mild, moderate, severe) ?  -  MILD (1-3): doesn't interfere with normal activities  ?  -  MODERATE (4-7): interferes with normal activities (e.g., work or school) or awakens from sleep, limping  ?  -  SEVERE (8-10): excruciating pain, unable to do any normal activities, unable to walk ?    Worsening difficulty standing left leg giving way ?4. WORK OR EXERCISE: "Has there been any recent work or exercise that involved this part of the body?"  ?    na ?5. CAUSE: "What do you think is causing the leg pain?" ?    Not sure  ?6. OTHER SYMPTOMS: "Do you have any other symptoms?" (e.g., chest pain, back pain, breathing difficulty, swelling, rash, fever, numbness,  weakness) ?    Pain in legs  to feet. C/o cough and congestion with chest heaviness ?7. PREGNANCY: "Is there any chance you are pregnant?" "When was your last menstrual period?" ?    na ? ?Answer Assessment - Initial Assessment Questions ?1. ONSET: "When did the cough begin?"  ?     A few days ago  ?2. SEVERITY: "How bad is the cough today?"  ?    Non productive feels heaviness in chest  when coughing ?3. SPUTUM: "Describe the color of your sputum" (none, dry cough; clear, white, yellow, green) ?    Na  ?4. HEMOPTYSIS: "Are you coughing up any blood?" If so ask: "How much?" (flecks, streaks, tablespoons, etc.) ?    na ?5. DIFFICULTY BREATHING: "Are you having difficulty breathing?" If Yes, ask: "How bad is it?" (e.g., mild, moderate, severe)  ?  - MILD: No SOB at rest, mild SOB with walking, speaks normally in sentences, can lie down, no retractions, pulse < 100.  ?  - MODERATE: SOB at rest, SOB with minimal exertion and prefers to sit, cannot lie down flat, speaks in phrases, mild retractions, audible wheezing, pulse 100-120.  ?  - SEVERE: Very SOB at rest, speaks in single words, struggling to breathe, sitting hunched forward, retractions, pulse > 120  ?    Denies  ?6. FEVER: "Do you have a fever?" If Yes, ask: "What is your temperature, how was it measured, and when did it start?" ?  no ?7. CARDIAC HISTORY: "Do you have any history of heart disease?" (e.g., heart attack, congestive heart failure)  ?    See hx  ?8. LUNG HISTORY: "Do you have any history of lung disease?"  (e.g., pulmonary embolus, asthma, emphysema) ?    na ?9. PE RISK FACTORS: "Do you have a history of blood clots?" (or: recent major surgery, recent prolonged travel, bedridden) ?    na ?10. OTHER SYMPTOMS: "Do you have any other symptoms?" (e.g., runny nose, wheezing, chest pain) ?      Runny nose, chest congestion cough chest heaviness ?11. PREGNANCY: "Is there any chance you are pregnant?" "When was your last menstrual period?" ?      na ?12.  TRAVEL: "Have you traveled out of the country in the last month?" (e.g., travel history, exposures) ?      na ? ?Protocols used: Leg Pain-A-AH, Cough - Acute Non-Productive-A-AH ? ?

## 2021-10-07 ENCOUNTER — Ambulatory Visit: Payer: Medicare PPO | Admitting: Family Medicine

## 2021-10-08 ENCOUNTER — Telehealth: Payer: Self-pay | Admitting: Family

## 2021-10-08 ENCOUNTER — Encounter: Payer: Self-pay | Admitting: Family

## 2021-10-08 ENCOUNTER — Other Ambulatory Visit: Payer: Self-pay | Admitting: Family

## 2021-10-08 ENCOUNTER — Other Ambulatory Visit
Admission: RE | Admit: 2021-10-08 | Discharge: 2021-10-08 | Disposition: A | Discharge status: Home / Self Care | Payer: Medicare PPO | Source: Ambulatory Visit | Attending: Family | Admitting: Family

## 2021-10-08 ENCOUNTER — Ambulatory Visit (HOSPITAL_BASED_OUTPATIENT_CLINIC_OR_DEPARTMENT_OTHER): Payer: Medicare PPO | Admitting: Family

## 2021-10-08 VITALS — BP 107/57 | HR 77 | Resp 16 | Ht 65.0 in | Wt 156.0 lb

## 2021-10-08 DIAGNOSIS — I5023 Acute on chronic systolic (congestive) heart failure: Secondary | ICD-10-CM | POA: Diagnosis present

## 2021-10-08 DIAGNOSIS — J9690 Respiratory failure, unspecified, unspecified whether with hypoxia or hypercapnia: Secondary | ICD-10-CM | POA: Diagnosis not present

## 2021-10-08 DIAGNOSIS — R42 Dizziness and giddiness: Secondary | ICD-10-CM | POA: Insufficient documentation

## 2021-10-08 DIAGNOSIS — G2581 Restless legs syndrome: Secondary | ICD-10-CM | POA: Diagnosis present

## 2021-10-08 DIAGNOSIS — N179 Acute kidney failure, unspecified: Secondary | ICD-10-CM | POA: Diagnosis not present

## 2021-10-08 DIAGNOSIS — N390 Urinary tract infection, site not specified: Secondary | ICD-10-CM | POA: Diagnosis not present

## 2021-10-08 DIAGNOSIS — N1832 Chronic kidney disease, stage 3b: Secondary | ICD-10-CM | POA: Diagnosis present

## 2021-10-08 DIAGNOSIS — R7989 Other specified abnormal findings of blood chemistry: Secondary | ICD-10-CM | POA: Diagnosis not present

## 2021-10-08 DIAGNOSIS — E1136 Type 2 diabetes mellitus with diabetic cataract: Secondary | ICD-10-CM | POA: Insufficient documentation

## 2021-10-08 DIAGNOSIS — I959 Hypotension, unspecified: Secondary | ICD-10-CM | POA: Diagnosis not present

## 2021-10-08 DIAGNOSIS — I700 Atherosclerosis of aorta: Secondary | ICD-10-CM | POA: Diagnosis not present

## 2021-10-08 DIAGNOSIS — Z515 Encounter for palliative care: Secondary | ICD-10-CM | POA: Diagnosis not present

## 2021-10-08 DIAGNOSIS — D631 Anemia in chronic kidney disease: Secondary | ICD-10-CM | POA: Insufficient documentation

## 2021-10-08 DIAGNOSIS — J690 Pneumonitis due to inhalation of food and vomit: Secondary | ICD-10-CM | POA: Diagnosis present

## 2021-10-08 DIAGNOSIS — I10 Essential (primary) hypertension: Secondary | ICD-10-CM | POA: Diagnosis not present

## 2021-10-08 DIAGNOSIS — I130 Hypertensive heart and chronic kidney disease with heart failure and stage 1 through stage 4 chronic kidney disease, or unspecified chronic kidney disease: Secondary | ICD-10-CM | POA: Diagnosis present

## 2021-10-08 DIAGNOSIS — R918 Other nonspecific abnormal finding of lung field: Secondary | ICD-10-CM | POA: Diagnosis not present

## 2021-10-08 DIAGNOSIS — I509 Heart failure, unspecified: Secondary | ICD-10-CM | POA: Diagnosis not present

## 2021-10-08 DIAGNOSIS — R410 Disorientation, unspecified: Secondary | ICD-10-CM | POA: Diagnosis not present

## 2021-10-08 DIAGNOSIS — G4733 Obstructive sleep apnea (adult) (pediatric): Secondary | ICD-10-CM

## 2021-10-08 DIAGNOSIS — R0602 Shortness of breath: Secondary | ICD-10-CM | POA: Diagnosis not present

## 2021-10-08 DIAGNOSIS — R5383 Other fatigue: Secondary | ICD-10-CM | POA: Insufficient documentation

## 2021-10-08 DIAGNOSIS — Z9989 Dependence on other enabling machines and devices: Secondary | ICD-10-CM | POA: Insufficient documentation

## 2021-10-08 DIAGNOSIS — R000 Tachycardia, unspecified: Secondary | ICD-10-CM | POA: Diagnosis not present

## 2021-10-08 DIAGNOSIS — Z7901 Long term (current) use of anticoagulants: Secondary | ICD-10-CM | POA: Insufficient documentation

## 2021-10-08 DIAGNOSIS — R058 Other specified cough: Secondary | ICD-10-CM | POA: Insufficient documentation

## 2021-10-08 DIAGNOSIS — R1312 Dysphagia, oropharyngeal phase: Secondary | ICD-10-CM | POA: Diagnosis not present

## 2021-10-08 DIAGNOSIS — Z7401 Bed confinement status: Secondary | ICD-10-CM | POA: Diagnosis not present

## 2021-10-08 DIAGNOSIS — N189 Chronic kidney disease, unspecified: Secondary | ICD-10-CM | POA: Insufficient documentation

## 2021-10-08 DIAGNOSIS — R509 Fever, unspecified: Secondary | ICD-10-CM | POA: Diagnosis not present

## 2021-10-08 DIAGNOSIS — M792 Neuralgia and neuritis, unspecified: Secondary | ICD-10-CM

## 2021-10-08 DIAGNOSIS — I5022 Chronic systolic (congestive) heart failure: Secondary | ICD-10-CM | POA: Insufficient documentation

## 2021-10-08 DIAGNOSIS — E872 Acidosis, unspecified: Secondary | ICD-10-CM | POA: Diagnosis present

## 2021-10-08 DIAGNOSIS — R0902 Hypoxemia: Secondary | ICD-10-CM | POA: Diagnosis not present

## 2021-10-08 DIAGNOSIS — R5381 Other malaise: Secondary | ICD-10-CM | POA: Diagnosis not present

## 2021-10-08 DIAGNOSIS — Z981 Arthrodesis status: Secondary | ICD-10-CM | POA: Diagnosis not present

## 2021-10-08 DIAGNOSIS — M5134 Other intervertebral disc degeneration, thoracic region: Secondary | ICD-10-CM | POA: Diagnosis not present

## 2021-10-08 DIAGNOSIS — Z20822 Contact with and (suspected) exposure to covid-19: Secondary | ICD-10-CM | POA: Diagnosis present

## 2021-10-08 DIAGNOSIS — R531 Weakness: Secondary | ICD-10-CM | POA: Insufficient documentation

## 2021-10-08 DIAGNOSIS — Z66 Do not resuscitate: Secondary | ICD-10-CM | POA: Diagnosis present

## 2021-10-08 DIAGNOSIS — R29898 Other symptoms and signs involving the musculoskeletal system: Secondary | ICD-10-CM | POA: Diagnosis not present

## 2021-10-08 DIAGNOSIS — M19012 Primary osteoarthritis, left shoulder: Secondary | ICD-10-CM | POA: Diagnosis not present

## 2021-10-08 DIAGNOSIS — F32A Depression, unspecified: Secondary | ICD-10-CM | POA: Diagnosis present

## 2021-10-08 DIAGNOSIS — R6520 Severe sepsis without septic shock: Secondary | ICD-10-CM | POA: Diagnosis present

## 2021-10-08 DIAGNOSIS — I248 Other forms of acute ischemic heart disease: Secondary | ICD-10-CM | POA: Diagnosis present

## 2021-10-08 DIAGNOSIS — I5021 Acute systolic (congestive) heart failure: Secondary | ICD-10-CM | POA: Diagnosis not present

## 2021-10-08 DIAGNOSIS — G8929 Other chronic pain: Secondary | ICD-10-CM | POA: Insufficient documentation

## 2021-10-08 DIAGNOSIS — N170 Acute kidney failure with tubular necrosis: Secondary | ICD-10-CM | POA: Diagnosis present

## 2021-10-08 DIAGNOSIS — I517 Cardiomegaly: Secondary | ICD-10-CM | POA: Diagnosis not present

## 2021-10-08 DIAGNOSIS — E1140 Type 2 diabetes mellitus with diabetic neuropathy, unspecified: Secondary | ICD-10-CM | POA: Diagnosis not present

## 2021-10-08 DIAGNOSIS — J90 Pleural effusion, not elsewhere classified: Secondary | ICD-10-CM | POA: Diagnosis not present

## 2021-10-08 DIAGNOSIS — Z7984 Long term (current) use of oral hypoglycemic drugs: Secondary | ICD-10-CM | POA: Insufficient documentation

## 2021-10-08 DIAGNOSIS — G9341 Metabolic encephalopathy: Secondary | ICD-10-CM | POA: Diagnosis present

## 2021-10-08 DIAGNOSIS — E1165 Type 2 diabetes mellitus with hyperglycemia: Secondary | ICD-10-CM | POA: Diagnosis present

## 2021-10-08 DIAGNOSIS — E1122 Type 2 diabetes mellitus with diabetic chronic kidney disease: Secondary | ICD-10-CM | POA: Insufficient documentation

## 2021-10-08 DIAGNOSIS — Z7189 Other specified counseling: Secondary | ICD-10-CM | POA: Diagnosis not present

## 2021-10-08 DIAGNOSIS — R0689 Other abnormalities of breathing: Secondary | ICD-10-CM | POA: Diagnosis not present

## 2021-10-08 DIAGNOSIS — A419 Sepsis, unspecified organism: Secondary | ICD-10-CM | POA: Diagnosis present

## 2021-10-08 DIAGNOSIS — N3289 Other specified disorders of bladder: Secondary | ICD-10-CM | POA: Diagnosis present

## 2021-10-08 DIAGNOSIS — E1142 Type 2 diabetes mellitus with diabetic polyneuropathy: Secondary | ICD-10-CM | POA: Diagnosis present

## 2021-10-08 DIAGNOSIS — J449 Chronic obstructive pulmonary disease, unspecified: Secondary | ICD-10-CM | POA: Diagnosis not present

## 2021-10-08 DIAGNOSIS — R069 Unspecified abnormalities of breathing: Secondary | ICD-10-CM | POA: Diagnosis not present

## 2021-10-08 DIAGNOSIS — G20 Parkinson's disease: Secondary | ICD-10-CM | POA: Diagnosis present

## 2021-10-08 DIAGNOSIS — N3001 Acute cystitis with hematuria: Secondary | ICD-10-CM | POA: Diagnosis present

## 2021-10-08 DIAGNOSIS — N2581 Secondary hyperparathyroidism of renal origin: Secondary | ICD-10-CM | POA: Diagnosis present

## 2021-10-08 DIAGNOSIS — J9601 Acute respiratory failure with hypoxia: Secondary | ICD-10-CM | POA: Diagnosis present

## 2021-10-08 LAB — BASIC METABOLIC PANEL
Anion gap: 9 (ref 5–15)
BUN: 30 mg/dL — ABNORMAL HIGH (ref 8–23)
CO2: 33 mmol/L — ABNORMAL HIGH (ref 22–32)
Calcium: 8.5 mg/dL — ABNORMAL LOW (ref 8.9–10.3)
Chloride: 97 mmol/L — ABNORMAL LOW (ref 98–111)
Creatinine, Ser: 1.89 mg/dL — ABNORMAL HIGH (ref 0.44–1.00)
GFR, Estimated: 24 mL/min — ABNORMAL LOW (ref >60)
Glucose, Bld: 141 mg/dL — ABNORMAL HIGH (ref 70–99)
Potassium: 3.9 mmol/L (ref 3.5–5.1)
Sodium: 139 mmol/L (ref 135–145)

## 2021-10-08 NOTE — Telephone Encounter (Signed)
Spoke with patient regarding BMP results obtained earlier today. Renal function has declined some since starting jardiance. Will recheck labs next week on 10/16/21 before her pulmonology appt. Will call patient the day before to remind her. She verbalized understanding.  ?

## 2021-10-08 NOTE — Progress Notes (Signed)
? Patient ID: Renee Carpenter, female    DOB: [birth year], 88 y.o.   MRN: [id number] ? ?HPI ? ?Renee Carpenter is a 88 y/o female with a history of DM, HTN, CKD, anemia, sleep apnea, spondylolysis and chronic heart failure.  ? ?Echo report from 07/21/21 reviewed and showed an EF of 25-30% along with moderate LAE, moderate/severe MR and mild/moderate TR.  ? ?Admitted 07/20/21 due to generalized weakness, bilateral leg swelling and difficulty breathing due to HF exacerbation. Initially given IV lasix with transition to oral diuretics. Cardiology consult obtained. Elevated troponin thought to be due to demand ischemia. PT/OT evaluations done. Losartan held due to CKD. Discharged after 4 days.  ? ?She presents today for a follow-up visit with a chief complaint of moderate fatigue upon minimal exertion. She describes this as chronic in nature. She has associated decreased appetite, loose cough, shortness of breath, light-headedness, weakness and chronic pain along with this. She denies any difficulty sleeping, abdominal distention, palpitations, pedal edema, chest pain, chest tightness or weight gain.  ? ?Saw neurology 2 days and had a very low BP of 76/50 and had flomax stopped. BP today is better at 107/57.  ? ?Past Medical History:  ?Diagnosis Date  ? Anemia   ? WAS ANEMIC  ? Arthritis   ? Blood transfusion   ? HAD 1 SEVERAL YRS AGO...3-4 YR  ? CHF (congestive heart failure) ([place])   ? Chronic kidney disease   ? SOME DECREASE IN KIDNEY FUNCTION  ? Diabetes mellitus without complication ([place])   ? Hypertension   ? Normal cardiac stress test   ? REQUESTING [place]  ? Normal echocardiogram   ? REQUESTING THAT RESULT -- DR [name]   ? PONV (postoperative nausea and vomiting)   ? Shortness of breath   ? Sleep apnea   ? 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 ?Past Surgical History:  ?Procedure Laterality Date  ? ABDOMINAL HYSTERECTOMY    ? BACK SURGERY    ? 2 CERVICAL SURGERIES ALSO  ? CATARACTS    ? RIGHT EYE CATARACT  ?  CERVICAL DISCECTOMY  08/1995 and 2011  ? C4-,C5-6, C6-7 anterior cervical; Dr. Ellene Route fusion and plating  ? LAMINECTOMY    ? decompressive; Dr. Burman Riis in [place] MICRODISCECTOMY  04/14/2011  ? Procedure: LUMBAR LAMINECTOMY/DECOMPRESSION MICRODISCECTOMY;  Surgeon: [name];  Location: [facility];  Service: Neurosurgery;  Laterality: N/A;  Lumbar Three and Lumbar Four Laminectomy  ? TONSILLECTOMY AND ADENOIDECTOMY  1939  ? ?Family History  ?Problem Relation Age of Onset  ? Heart failure Father   ? Heart disease Father   ? Hypertension Mother   ? Diabetes Mother   ? Heart disease Mother   ? Cancer Sister   ?     breast  ? Breast cancer Sister 77  ? Cancer Maternal Grandfather   ? Cancer Maternal Aunt   ?     x3  ? Breast cancer Daughter 52  ? ?Social History  ? ?Tobacco Use  ? Smoking status: Never  ? Smokeless tobacco: Never  ?Substance Use Topics  ? Alcohol use: No  ? ?Allergies  ?Allergen Reactions  ? Acetaminophen Itching and Other (See Comments)  ?  Stomach upset  ? Aspirin Itching and Other (See Comments)  ?  Stomach pain  ? Metformin And Related   ?  nausea  ? Other Other (See Comments)  ?  Novocaine, strange feeling  ? Sulfa Antibiotics Other (See Comments)  ?  Sulfasalazine   ?  Other reaction(s): Other (See Comments)  ? Tizanidine   ?  Diarrhea, almost passed out, felt dizzy and confused  ? Caffeine Palpitations  ? Ginger Palpitations  ? Ibuprofen Rash  ? ?Prior to Admission medications   ?Medication Sig Start Date End Date Taking? Authorizing Provider  ?Accu-Chek FastClix Lancets MISC Just check first thing in the morning to ensure no low blood sugar. 07/24/21  Yes Enzo Bi, MD  ?albuterol (VENTOLIN HFA) 108 (90 Base) MCG/ACT inhaler INHALE 2 PUFFS INTO THE LUNGS EVERY 4 HOURS AS NEEDED FOR WHEEZING OR SHORTNESS OF BREATH 02/19/21  Yes Jerrol Banana., MD  ?blood glucose meter kit and supplies KIT Just check first thing in the morning to ensure no low blood  sugar. 07/24/21  Yes Enzo Bi, MD  ?Blood Glucose Monitoring Suppl (ACCU-CHEK GUIDE) w/Device KIT Check blood sugars daily as directed 08/04/20  Yes Jerrol Banana., MD  ?carbidopa-levodopa (SINEMET IR) 25-100 MG tablet Take 1 tablet by mouth 3 (three) times daily. Take 1 tablet three times a day 05/21/20  Yes [provider]  ?Coenzyme Q10 (CO Q10) 200 MG CAPS Take 1 capsule by mouth daily. 08/09/11  Yes [provider]  ?DULoxetine (CYMBALTA) 20 MG capsule Take 40 mg by mouth daily.   Yes [provider]  ?empagliflozin (JARDIANCE) 10 MG TABS tablet Take by mouth daily.   Yes [provider]  ?esomeprazole (NEXIUM) 40 MG capsule TAKE 1 CAPSULE BY MOUTH EVERY DAY ?Patient taking differently: Take 40 mg by mouth daily. TAKE 1 CAPSULE BY MOUTH EVERY DAY 05/21/21  Yes Birdie Sons, MD  ?fluticasone (FLONASE) 50 MCG/ACT nasal spray Place 2 sprays into both nostrils daily. 07/28/21  Yes [name], PA-C  ?furosemide (LASIX) 40 MG tablet Take 1 tablet (40 mg total) by mouth daily. 07/24/21 10/22/21 Yes Enzo Bi, MD  ?gabapentin (NEURONTIN) 600 MG tablet TAKE 1 TABLET(600 MG) BY MOUTH THREE TIMES DAILY ?Patient taking differently: Take 600 mg by mouth 3 (three) times daily. 02/06/21  Yes Jerrol Banana., MD  ?glimepiride (AMARYL) 4 MG tablet TAKE 1 TABLET(4 MG) BY MOUTH DAILY BEFORE BREAKFAST 05/21/21  Yes Birdie Sons, MD  ?glucose blood (ACCU-CHEK GUIDE) test strip Just check first thing in the morning to ensure no low blood sugar. 07/24/21  Yes Enzo Bi, MD  ?hydroxypropyl methylcellulose (ISOPTO TEARS) 2.5 % ophthalmic solution Place 1 drop into both eyes 3 (three) times daily as needed. Dry eyes    Yes [provider]  ?MAGNESIUM-OXIDE 400 (241.3 Mg) MG tablet TAKE 1 TABLET BY MOUTH TWICE DAILY 03/17/17  Yes Jerrol Banana., MD  ?metoprolol succinate (TOPROL-XL) 25 MG 24 hr tablet Take 0.5 tablets (12.5 mg total) by mouth daily. 07/24/21 10/22/21  Yes Enzo Bi, MD  ?MULTIPLE VITAMIN PO Take 1 tablet by mouth daily. 08/09/11  Yes [provider]  ?Probiotic Product (ALIGN PO) Take 1 capsule by mouth daily.   Yes [provider]  ?rOPINIRole (REQUIP) 0.5 MG tablet Take 0.5 mg by mouth 3 (three) times daily. 10/10/12  Yes [provider]  ?DULoxetine (CYMBALTA) 30 MG capsule TAKE 1 CAPSULE(30 MG) BY MOUTH DAILY ?Patient not taking: Reported on 09/08/2021 07/28/21   Jerrol Banana., MD  ?tamsulosin (FLOMAX) 0.4 MG CAPS capsule TAKE 1 CAPSULE(0.4 MG) BY MOUTH DAILY ?Patient not taking: Reported on 10/08/2021 09/28/21   Jerrol Banana., MD  ? ?Review of Systems  ?Constitutional:  Positive for appetite change (decreasedl) and fatigue (easily).  ?HENT:  Positive for hearing loss. Negative for congestion and sore throat.   ?Eyes:  Positive for visual disturbance (cloudy vision).  ?Respiratory:  Positive for cough (loose) and shortness of breath ("little bit"). Negative for chest tightness.   ?Cardiovascular:  Negative for chest pain, palpitations and leg swelling.  ?Gastrointestinal:  Negative for abdominal distention and abdominal pain.  ?Endocrine: Negative.   ?Genitourinary: Negative.   ?Musculoskeletal:  Positive for back pain and neck pain (at times).  ?Skin: Negative.   ?Allergic/Immunologic: Negative.   ?Neurological:  Positive for weakness and light-headedness. Negative for dizziness.  ?     Neuropathy  ?Hematological:  Negative for adenopathy. Bruises/bleeds easily.  ?Psychiatric/Behavioral:  Negative for dysphoric mood and sleep disturbance (sleeping on 1 pillow). The patient is not nervous/anxious.   ? ?Vitals:  ? 10/08/21 1324  ?BP: (!) 107/57  ?Pulse: 77  ?Resp: 16  ?SpO2: 99%  ?Weight: 156 lb (70.8 kg)  ?Height: $RemoveB'5\' 5"'Bvpojbod$  (1.651 m)  ? ?Wt Readings from Last 3 Encounters:  ?10/08/21 156 lb (70.8 kg)  ?09/08/21 152 lb 8 oz (69.2 kg)  ?07/28/21 156 lb 11.2 oz (71.1 kg)  ? ?Lab Results  ?Component Value Date  ? CREATININE 1.37 (H)  07/24/2021  ? CREATININE 1.45 (H) 07/23/2021  ? CREATININE 1.47 (H) 07/22/2021  ? ?Physical Exam ?Vitals and nursing note reviewed. Exam conducted with a chaperone present (caregiver).  ?Constitutional:

## 2021-10-08 NOTE — Patient Instructions (Signed)
Continue weighing daily and call for an overnight weight gain of 3 pounds or more or a weekly weight gain of more than 5 pounds.   If you have voicemail, please make sure your mailbox is cleaned out so that we may leave a message and please make sure to listen to any voicemails.     

## 2021-10-09 ENCOUNTER — Inpatient Hospital Stay
Admission: EM | Admit: 2021-10-09 | Discharge: 2021-10-19 | DRG: 871 | Disposition: A | Discharge status: Skilled Nursing Facility | Payer: Medicare PPO | Attending: Internal Medicine | Admitting: Internal Medicine

## 2021-10-09 ENCOUNTER — Emergency Department: Payer: Medicare PPO

## 2021-10-09 ENCOUNTER — Other Ambulatory Visit: Payer: Self-pay

## 2021-10-09 DIAGNOSIS — R7989 Other specified abnormal findings of blood chemistry: Secondary | ICD-10-CM

## 2021-10-09 DIAGNOSIS — Z66 Do not resuscitate: Secondary | ICD-10-CM | POA: Diagnosis present

## 2021-10-09 DIAGNOSIS — Z981 Arthrodesis status: Secondary | ICD-10-CM

## 2021-10-09 DIAGNOSIS — D631 Anemia in chronic kidney disease: Secondary | ICD-10-CM | POA: Diagnosis present

## 2021-10-09 DIAGNOSIS — I5023 Acute on chronic systolic (congestive) heart failure: Secondary | ICD-10-CM | POA: Diagnosis present

## 2021-10-09 DIAGNOSIS — Z9841 Cataract extraction status, right eye: Secondary | ICD-10-CM

## 2021-10-09 DIAGNOSIS — E872 Acidosis, unspecified: Secondary | ICD-10-CM | POA: Diagnosis present

## 2021-10-09 DIAGNOSIS — Z8249 Family history of ischemic heart disease and other diseases of the circulatory system: Secondary | ICD-10-CM

## 2021-10-09 DIAGNOSIS — I130 Hypertensive heart and chronic kidney disease with heart failure and stage 1 through stage 4 chronic kidney disease, or unspecified chronic kidney disease: Secondary | ICD-10-CM | POA: Diagnosis present

## 2021-10-09 DIAGNOSIS — N170 Acute kidney failure with tubular necrosis: Secondary | ICD-10-CM | POA: Diagnosis present

## 2021-10-09 DIAGNOSIS — G8929 Other chronic pain: Secondary | ICD-10-CM | POA: Diagnosis present

## 2021-10-09 DIAGNOSIS — I509 Heart failure, unspecified: Secondary | ICD-10-CM

## 2021-10-09 DIAGNOSIS — N2581 Secondary hyperparathyroidism of renal origin: Secondary | ICD-10-CM | POA: Diagnosis present

## 2021-10-09 DIAGNOSIS — E1165 Type 2 diabetes mellitus with hyperglycemia: Secondary | ICD-10-CM | POA: Diagnosis present

## 2021-10-09 DIAGNOSIS — Z7984 Long term (current) use of oral hypoglycemic drugs: Secondary | ICD-10-CM

## 2021-10-09 DIAGNOSIS — I248 Other forms of acute ischemic heart disease: Secondary | ICD-10-CM | POA: Diagnosis present

## 2021-10-09 DIAGNOSIS — R54 Age-related physical debility: Secondary | ICD-10-CM | POA: Diagnosis present

## 2021-10-09 DIAGNOSIS — E1142 Type 2 diabetes mellitus with diabetic polyneuropathy: Secondary | ICD-10-CM

## 2021-10-09 DIAGNOSIS — Z79899 Other long term (current) drug therapy: Secondary | ICD-10-CM

## 2021-10-09 DIAGNOSIS — G20 Parkinson's disease: Secondary | ICD-10-CM | POA: Diagnosis present

## 2021-10-09 DIAGNOSIS — N179 Acute kidney failure, unspecified: Secondary | ICD-10-CM

## 2021-10-09 DIAGNOSIS — N3001 Acute cystitis with hematuria: Secondary | ICD-10-CM | POA: Diagnosis present

## 2021-10-09 DIAGNOSIS — Z20822 Contact with and (suspected) exposure to covid-19: Secondary | ICD-10-CM | POA: Diagnosis present

## 2021-10-09 DIAGNOSIS — I447 Left bundle-branch block, unspecified: Secondary | ICD-10-CM | POA: Diagnosis present

## 2021-10-09 DIAGNOSIS — G9341 Metabolic encephalopathy: Secondary | ICD-10-CM | POA: Diagnosis present

## 2021-10-09 DIAGNOSIS — N183 Chronic kidney disease, stage 3 unspecified: Secondary | ICD-10-CM

## 2021-10-09 DIAGNOSIS — F411 Generalized anxiety disorder: Secondary | ICD-10-CM | POA: Diagnosis present

## 2021-10-09 DIAGNOSIS — Z833 Family history of diabetes mellitus: Secondary | ICD-10-CM

## 2021-10-09 DIAGNOSIS — A419 Sepsis, unspecified organism: Secondary | ICD-10-CM | POA: Diagnosis present

## 2021-10-09 DIAGNOSIS — Z803 Family history of malignant neoplasm of breast: Secondary | ICD-10-CM

## 2021-10-09 DIAGNOSIS — Z90710 Acquired absence of both cervix and uterus: Secondary | ICD-10-CM

## 2021-10-09 DIAGNOSIS — G4733 Obstructive sleep apnea (adult) (pediatric): Secondary | ICD-10-CM | POA: Diagnosis present

## 2021-10-09 DIAGNOSIS — F32A Depression, unspecified: Secondary | ICD-10-CM | POA: Diagnosis present

## 2021-10-09 DIAGNOSIS — N1832 Chronic kidney disease, stage 3b: Secondary | ICD-10-CM | POA: Diagnosis present

## 2021-10-09 DIAGNOSIS — J690 Pneumonitis due to inhalation of food and vomit: Secondary | ICD-10-CM | POA: Diagnosis present

## 2021-10-09 DIAGNOSIS — Z515 Encounter for palliative care: Secondary | ICD-10-CM

## 2021-10-09 DIAGNOSIS — G2581 Restless legs syndrome: Secondary | ICD-10-CM | POA: Diagnosis present

## 2021-10-09 DIAGNOSIS — Z7189 Other specified counseling: Secondary | ICD-10-CM | POA: Diagnosis not present

## 2021-10-09 DIAGNOSIS — K219 Gastro-esophageal reflux disease without esophagitis: Secondary | ICD-10-CM | POA: Diagnosis present

## 2021-10-09 DIAGNOSIS — E785 Hyperlipidemia, unspecified: Secondary | ICD-10-CM | POA: Diagnosis present

## 2021-10-09 DIAGNOSIS — Z888 Allergy status to other drugs, medicaments and biological substances status: Secondary | ICD-10-CM

## 2021-10-09 DIAGNOSIS — J9601 Acute respiratory failure with hypoxia: Secondary | ICD-10-CM | POA: Diagnosis present

## 2021-10-09 DIAGNOSIS — Z881 Allergy status to other antibiotic agents status: Secondary | ICD-10-CM

## 2021-10-09 DIAGNOSIS — R6520 Severe sepsis without septic shock: Secondary | ICD-10-CM | POA: Diagnosis present

## 2021-10-09 DIAGNOSIS — K5900 Constipation, unspecified: Secondary | ICD-10-CM | POA: Diagnosis not present

## 2021-10-09 DIAGNOSIS — E1122 Type 2 diabetes mellitus with diabetic chronic kidney disease: Secondary | ICD-10-CM | POA: Diagnosis present

## 2021-10-09 DIAGNOSIS — R778 Other specified abnormalities of plasma proteins: Secondary | ICD-10-CM

## 2021-10-09 DIAGNOSIS — N3289 Other specified disorders of bladder: Secondary | ICD-10-CM | POA: Diagnosis present

## 2021-10-09 LAB — RESP PANEL BY RT-PCR (FLU A&B, COVID) ARPGX2
Influenza A by PCR: NEGATIVE
Influenza B by PCR: NEGATIVE
SARS Coronavirus 2 by RT PCR: NEGATIVE

## 2021-10-09 LAB — COMPREHENSIVE METABOLIC PANEL
ALT: 7 U/L (ref 0–44)
AST: 28 U/L (ref 15–41)
Albumin: 3.4 g/dL — ABNORMAL LOW (ref 3.5–5.0)
Alkaline Phosphatase: 69 U/L (ref 38–126)
Anion gap: 8 (ref 5–15)
BUN: 29 mg/dL — ABNORMAL HIGH (ref 8–23)
CO2: 32 mmol/L (ref 22–32)
Calcium: 8.4 mg/dL — ABNORMAL LOW (ref 8.9–10.3)
Chloride: 97 mmol/L — ABNORMAL LOW (ref 98–111)
Creatinine, Ser: 1.69 mg/dL — ABNORMAL HIGH (ref 0.44–1.00)
GFR, Estimated: 28 mL/min — ABNORMAL LOW (ref >60)
Glucose, Bld: 176 mg/dL — ABNORMAL HIGH (ref 70–99)
Potassium: 4.1 mmol/L (ref 3.5–5.1)
Sodium: 137 mmol/L (ref 135–145)
Total Bilirubin: 0.9 mg/dL (ref 0.3–1.2)
Total Protein: 6.8 g/dL (ref 6.5–8.1)

## 2021-10-09 LAB — URINALYSIS, COMPLETE (UACMP) WITH MICROSCOPIC
Bilirubin Urine: NEGATIVE
Glucose, UA: >=500 mg/dL — AB
Ketones, ur: NEGATIVE mg/dL
Nitrite: NEGATIVE
Protein, ur: 30 mg/dL — AB
RBC / HPF: >50 RBC/hpf — ABNORMAL HIGH (ref 0–5)
Specific Gravity, Urine: 1.007 (ref 1.005–1.030)
WBC, UA: >50 WBC/hpf — ABNORMAL HIGH (ref 0–5)
pH: 6 (ref 5.0–8.0)

## 2021-10-09 LAB — CBC
HCT: 35 % — ABNORMAL LOW (ref 36.0–46.0)
Hemoglobin: 11.7 g/dL — ABNORMAL LOW (ref 12.0–15.0)
MCH: 38.6 pg — ABNORMAL HIGH (ref 26.0–34.0)
MCHC: 33.4 g/dL (ref 30.0–36.0)
MCV: 115.5 fL — ABNORMAL HIGH (ref 80.0–100.0)
Platelets: 207 10*3/uL (ref 150–400)
RBC: 3.03 MIL/uL — ABNORMAL LOW (ref 3.87–5.11)
RDW: 19.2 % — ABNORMAL HIGH (ref 11.5–15.5)
WBC: 6.4 10*3/uL (ref 4.0–10.5)
nRBC: 0.5 % — ABNORMAL HIGH (ref 0.0–0.2)

## 2021-10-09 LAB — BRAIN NATRIURETIC PEPTIDE: B Natriuretic Peptide: 1191.4 pg/mL — ABNORMAL HIGH (ref 0.0–100.0)

## 2021-10-09 LAB — TROPONIN I (HIGH SENSITIVITY)
Troponin I (High Sensitivity): 129 ng/L (ref <18)
Troponin I (High Sensitivity): 163 ng/L (ref <18)
Troponin I (High Sensitivity): 186 ng/L (ref <18)

## 2021-10-09 LAB — LACTIC ACID, PLASMA
Lactic Acid, Venous: 1.5 mmol/L (ref 0.5–1.9)
Lactic Acid, Venous: 1.2 mmol/L (ref 0.5–1.9)

## 2021-10-09 LAB — CBG MONITORING, ED: Glucose-Capillary: 169 mg/dL — ABNORMAL HIGH (ref 70–99)

## 2021-10-09 LAB — PROCALCITONIN: Procalcitonin: <0.1 ng/mL

## 2021-10-09 MED ORDER — ENOXAPARIN SODIUM 30 MG/0.3ML IJ SOSY
30.0000 mg | PREFILLED_SYRINGE | INTRAMUSCULAR | Status: DC
  Administered 2021-10-09 – 2021-10-13 (×5): 30 mg via SUBCUTANEOUS
  Filled 2021-10-09 (×5): qty 0.3

## 2021-10-09 MED ORDER — SODIUM CHLORIDE 0.9% FLUSH: 3.0000 mL | INTRAVENOUS | Status: DC | PRN

## 2021-10-09 MED ORDER — POLYVINYL ALCOHOL 1.4 % OP SOLN
1.0000 [drp] | Freq: Three times a day (TID) | OPHTHALMIC | Status: DC | PRN
  Administered 2021-10-11: 1 [drp] via OPHTHALMIC
  Filled 2021-10-09: qty 15

## 2021-10-09 MED ORDER — DULOXETINE HCL 30 MG PO CPEP
30.0000 mg | ORAL_CAPSULE | Freq: Every day | ORAL | Status: DC
  Administered 2021-10-10 – 2021-10-19 (×10): 30 mg via ORAL
  Filled 2021-10-09 (×10): qty 1

## 2021-10-09 MED ORDER — ROPINIROLE HCL 1 MG PO TABS
0.5000 mg | ORAL_TABLET | Freq: Three times a day (TID) | ORAL | Status: DC
  Administered 2021-10-10 – 2021-10-19 (×29): 0.5 mg via ORAL
  Filled 2021-10-09 (×3): qty 1
  Filled 2021-10-09 (×2): qty 2
  Filled 2021-10-09 (×11): qty 1
  Filled 2021-10-09: qty 2
  Filled 2021-10-09 (×3): qty 1
  Filled 2021-10-09: qty 2
  Filled 2021-10-09: qty 1
  Filled 2021-10-09: qty 2
  Filled 2021-10-09 (×5): qty 1
  Filled 2021-10-09: qty 2
  Filled 2021-10-09 (×3): qty 1

## 2021-10-09 MED ORDER — FUROSEMIDE 10 MG/ML IJ SOLN: 60.0000 mg | Freq: Every day | INTRAMUSCULAR | Status: DC

## 2021-10-09 MED ORDER — PANTOPRAZOLE SODIUM 40 MG PO TBEC
40.0000 mg | DELAYED_RELEASE_TABLET | Freq: Every day | ORAL | Status: DC
  Administered 2021-10-09 – 2021-10-14 (×6): 40 mg via ORAL
  Filled 2021-10-09 (×6): qty 1

## 2021-10-09 MED ORDER — FUROSEMIDE 10 MG/ML IJ SOLN
60.0000 mg | Freq: Once | INTRAMUSCULAR | Status: AC
  Administered 2021-10-09: 60 mg via INTRAVENOUS
  Filled 2021-10-09: qty 8

## 2021-10-09 MED ORDER — PREGABALIN 25 MG PO CAPS
25.0000 mg | ORAL_CAPSULE | Freq: Two times a day (BID) | ORAL | Status: DC
  Administered 2021-10-09: 25 mg via ORAL
  Filled 2021-10-09: qty 1

## 2021-10-09 MED ORDER — INSULIN ASPART 100 UNIT/ML IJ SOLN
0.0000 [IU] | Freq: Three times a day (TID) | INTRAMUSCULAR | Status: DC
  Administered 2021-10-09 – 2021-10-10 (×2): 3 [IU] via SUBCUTANEOUS
  Administered 2021-10-10: 2 [IU] via SUBCUTANEOUS
  Administered 2021-10-10 – 2021-10-11 (×3): 5 [IU] via SUBCUTANEOUS
  Administered 2021-10-11: 8 [IU] via SUBCUTANEOUS
  Administered 2021-10-12: 5 [IU] via SUBCUTANEOUS
  Administered 2021-10-12: 3 [IU] via SUBCUTANEOUS
  Administered 2021-10-12: 5 [IU] via SUBCUTANEOUS
  Administered 2021-10-13: 8 [IU] via SUBCUTANEOUS
  Administered 2021-10-13 – 2021-10-15 (×6): 3 [IU] via SUBCUTANEOUS
  Administered 2021-10-15: 5 [IU] via SUBCUTANEOUS
  Administered 2021-10-15: 2 [IU] via SUBCUTANEOUS
  Administered 2021-10-16 (×2): 3 [IU] via SUBCUTANEOUS
  Filled 2021-10-09 (×21): qty 1

## 2021-10-09 MED ORDER — CARBIDOPA-LEVODOPA 25-100 MG PO TABS
1.0000 | ORAL_TABLET | Freq: Three times a day (TID) | ORAL | Status: DC
  Administered 2021-10-09 – 2021-10-19 (×29): 1 via ORAL
  Filled 2021-10-09 (×32): qty 1

## 2021-10-09 MED ORDER — SODIUM CHLORIDE 0.9% FLUSH
3.0000 mL | Freq: Two times a day (BID) | INTRAVENOUS | Status: DC
  Administered 2021-10-09 – 2021-10-19 (×19): 3 mL via INTRAVENOUS

## 2021-10-09 MED ORDER — SODIUM CHLORIDE 0.9 % IV SOLN
250.0000 mL | INTRAVENOUS | Status: DC | PRN
  Administered 2021-10-18: 250 mL via INTRAVENOUS

## 2021-10-09 MED ORDER — IPRATROPIUM-ALBUTEROL 0.5-2.5 (3) MG/3ML IN SOLN
3.0000 mL | RESPIRATORY_TRACT | Status: DC | PRN
  Administered 2021-10-13: 3 mL via RESPIRATORY_TRACT
  Filled 2021-10-09: qty 3

## 2021-10-09 MED ORDER — SODIUM CHLORIDE 0.9 % IV SOLN
500.0000 mg | INTRAVENOUS | Status: DC
  Administered 2021-10-09 – 2021-10-12 (×4): 500 mg via INTRAVENOUS
  Filled 2021-10-09 (×2): qty 5
  Filled 2021-10-09 (×2): qty 500
  Filled 2021-10-09: qty 5

## 2021-10-09 MED ORDER — TAMSULOSIN HCL 0.4 MG PO CAPS
0.4000 mg | ORAL_CAPSULE | Freq: Every day | ORAL | Status: DC
Start: 2021-10-09 — End: 2021-10-09

## 2021-10-09 MED ORDER — FUROSEMIDE 10 MG/ML IJ SOLN: 40.0000 mg | Freq: Every day | INTRAMUSCULAR | Status: DC

## 2021-10-09 MED ORDER — ALPRAZOLAM 0.25 MG PO TABS
0.2500 mg | ORAL_TABLET | Freq: Two times a day (BID) | ORAL | Status: DC | PRN
  Administered 2021-10-09 – 2021-10-16 (×6): 0.25 mg via ORAL
  Filled 2021-10-09 (×6): qty 1

## 2021-10-09 MED ORDER — SODIUM CHLORIDE 0.9 % IV SOLN
2.0000 g | INTRAVENOUS | Status: DC
  Administered 2021-10-09: 2 g via INTRAVENOUS
  Filled 2021-10-09: qty 20

## 2021-10-09 MED ORDER — ALBUTEROL SULFATE (2.5 MG/3ML) 0.083% IN NEBU: 3.0000 mL | INHALATION_SOLUTION | RESPIRATORY_TRACT | Status: DC | PRN

## 2021-10-09 MED ORDER — FLUTICASONE PROPIONATE 50 MCG/ACT NA SUSP
2.0000 | Freq: Every day | NASAL | Status: DC | PRN
  Filled 2021-10-09: qty 16

## 2021-10-09 MED ORDER — GABAPENTIN 400 MG PO CAPS
400.0000 mg | ORAL_CAPSULE | Freq: Three times a day (TID) | ORAL | Status: DC
  Administered 2021-10-09 – 2021-10-10 (×2): 400 mg via ORAL
  Filled 2021-10-09 (×4): qty 1

## 2021-10-09 MED ORDER — ACETAMINOPHEN 325 MG PO TABS
650.0000 mg | ORAL_TABLET | Freq: Once | ORAL | Status: AC
  Administered 2021-10-09: 650 mg via ORAL
  Filled 2021-10-09: qty 2

## 2021-10-09 MED ORDER — INSULIN ASPART 100 UNIT/ML IJ SOLN
0.0000 [IU] | Freq: Every day | INTRAMUSCULAR | Status: DC
  Administered 2021-10-10: 2 [IU] via SUBCUTANEOUS
  Filled 2021-10-09: qty 1

## 2021-10-09 MED ORDER — ONDANSETRON HCL 4 MG/2ML IJ SOLN: 4.0000 mg | Freq: Four times a day (QID) | INTRAMUSCULAR | Status: DC | PRN

## 2021-10-09 MED ORDER — OXYBUTYNIN CHLORIDE 5 MG PO TABS
5.0000 mg | ORAL_TABLET | Freq: Three times a day (TID) | ORAL | Status: DC | PRN
  Filled 2021-10-09: qty 1

## 2021-10-09 NOTE — Consult Note (Signed)
CODE SEPSIS - PHARMACY COMMUNICATION ? ?**Broad Spectrum Antibiotics should be administered within 1 hour of Sepsis diagnosis** ? ?Time Code Sepsis Called/Page Received: [id number] ? ?Antibiotics Ordered: 1345 ? ?Time of 1st antibiotic administration: 1513 ? ?Additional action taken by pharmacy: N/A ? ?If necessary, Name of Provider/Nurse Contacted: N/A ? ?Lorna Dibble ,PharmD ?Clinical Pharmacist  ?10/09/2021  1:44 PM ? ?

## 2021-10-09 NOTE — Progress Notes (Signed)
[facility] following for sepsis protocol. 

## 2021-10-09 NOTE — Care Management (Signed)
Notified by bedside RN, second troponin 163.  Patient switched to simple mask as she keeps pulling off [place] and desaturating. ? ?Continue to suspect demand ischemia in setting of ADHF.  UOP 800cc after 60 IV lasix ? ?Plan: ?Trend trop to peak ?Add prn nebs for SOB ?Monitor UOP ? ?Patient with several advanced comorbidities.  High risk of clinical decompensation.  Cardiology consulted and will see patient in AM.  Low threshold for transfer to higher level of care ? ?Lolita Patella MD ?

## 2021-10-09 NOTE — Progress Notes (Signed)
PHARMACIST - PHYSICIAN COMMUNICATION ? ?CONCERNING:  Enoxaparin (Lovenox) for DVT Prophylaxis  ? ? ?RECOMMENDATION: ?Patient was prescribed enoxaprin 40mg  q24 hours for VTE prophylaxis.  ? Weights  ? 10/09/21 1335  ?Weight: 72.4 kg (159 lb 9.6 oz)  ? ? ?Body mass index is 26.56 kg/m?. ? ?Estimated Creatinine Clearance: 20.7 mL/min (A) (by C-G formula based on SCr of 1.69 mg/dL (H)). ? ? ?Patient is candidate for enoxaparin 30mg  every 24 hours based on CrCl <77ml/min or Weight <45kg ? ?DESCRIPTION: ?Pharmacy has adjusted enoxaparin dose per [facility] policy. ? ?Patient is now receiving enoxaparin 30 mg every 24 hours  ? ? ?31m, PharmD ?Pharmacy Resident  ?10/09/2021 ?4:35 PM ? ? ?

## 2021-10-09 NOTE — H&P (Addendum)
?History and Physical  ? ? ?[name] LGX:[id number] DOB: 05-30-1926 DOA: 10/09/2021 ? ?PCP: Jerrol Banana., MD  ?Patient coming from: Home ? ?I have personally briefly reviewed patient's old medical records in [place] ? ?Chief Complaint: Shortness of breath, fatigue, altered mentation ? ?HPI: Renee Carpenter is a 88 y.o. female with medical history significant of systolic congestive heart failure EF 25 to 30%, CKD stage IIIa, type 2 diabetes mellitus, parkinsonism, obstructive sleep apnea, hypertension presents for evaluation of progressively worsening shortness of breath associated with fatigue and altered mentation.  [name] is compliance with all medications.  No abdominal pain ? ?No home oxygen requirement.  On presentation brought in by EMS on 4 L nasal cannula after being found hypoxic at home.  Patient is a poor historian.  History obtained from multiple sources including the patient, patient's husband, ER physician, ER documentation. ? ?On my evaluation patient is uncomfortable appearing.  Difficult to obtain history from.  Per husband at bedside patient is complaining of lower abdominal pain.  Urine drawn on admission is concerning for urinary tract infection although patient is afebrile with normal lactic acidosis and no leukocytosis.  Patient was febrile with Tmax 102.4.  Patient has lab work including elevated BNP and chest x-ray that demonstrates evidence of interstitial edema.  Remainder vital signs are stable. ? ?ED Course: On presentation patient was found to be hypoxic.  Laboratory investigation significant findings as above.  Patient received 60 mg IV Lasix for suspected decompensated heart failure.  Received 1 g IV Rocephin.  Minimal troponin elevation but no chest pain.  EKG without ischemic changes.  Hospitalist contacted for admission. ? ?Review of Systems: As per HPI otherwise 14 point review of systems negative.  ?\ ?Past Medical History:  ?Diagnosis Date  ?  Anemia   ? WAS ANEMIC  ? Arthritis   ? Blood transfusion   ? HAD 1 SEVERAL YRS AGO...3-4 YR  ? CHF (congestive heart failure) ([place])   ? Chronic kidney disease   ? SOME DECREASE IN KIDNEY FUNCTION  ? Diabetes mellitus without complication ([place])   ? Hypertension   ? Normal cardiac stress test   ? REQUESTING [place]  ? Normal echocardiogram   ? REQUESTING THAT RESULT -- DR [name]   ? PONV (postoperative nausea and vomiting)   ? Shortness of breath   ? Sleep apnea   ? 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 ? ?Past Surgical History:  ?Procedure Laterality Date  ? ABDOMINAL HYSTERECTOMY    ? BACK SURGERY    ? 2 CERVICAL SURGERIES ALSO  ? CATARACTS    ? RIGHT EYE CATARACT  ? CERVICAL DISCECTOMY  08/1995 and 2011  ? C4-,C5-6, C6-7 anterior cervical; Dr. Ellene Route fusion and plating  ? LAMINECTOMY    ? decompressive; Dr. Burman Riis in [place] MICRODISCECTOMY  04/14/2011  ? Procedure: LUMBAR LAMINECTOMY/DECOMPRESSION MICRODISCECTOMY;  Surgeon: [name];  Location: [place];  Service: Neurosurgery;  Laterality: N/A;  Lumbar Three and Lumbar Four Laminectomy  ? TONSILLECTOMY AND ADENOIDECTOMY  1939  ? ? ? reports that she has never smoked. She has never used smokeless tobacco. She reports that she does not drink alcohol and does not use drugs. ? ?Allergies  ?Allergen Reactions  ? Acetaminophen Itching and Other (See Comments)  ?  Stomach upset  ? Aspirin Itching and Other (See Comments)  ?  Stomach pain  ? Metformin And Related   ?  nausea  ? Other Other (See Comments)  ?  Novocaine, strange feeling  ? Sulfa Antibiotics Other (See Comments)  ? Sulfasalazine   ?  Other reaction(s): Other (See Comments)  ? Tizanidine   ?  Diarrhea, almost passed out, felt dizzy and confused  ? Caffeine Palpitations  ? Ginger Palpitations  ? Ibuprofen Rash  ? ? ?Family History  ?Problem Relation Age of Onset  ? Heart failure Father   ? Heart disease Father   ? Hypertension Mother   ? Diabetes Mother   ?  Heart disease Mother   ? Cancer Sister   ?     breast  ? Breast cancer Sister 28  ? Cancer Maternal Grandfather   ? Cancer Maternal Aunt   ?     x3  ? Breast cancer Daughter 70  ? ? ?Prior to Admission medications   ?Medication Sig Start Date End Date Taking? Authorizing Provider  ?Accu-Chek FastClix Lancets MISC Just check first thing in the morning to ensure no low blood sugar. 07/24/21   Enzo Bi, MD  ?albuterol (VENTOLIN HFA) 108 (90 Base) MCG/ACT inhaler INHALE 2 PUFFS INTO THE LUNGS EVERY 4 HOURS AS NEEDED FOR WHEEZING OR SHORTNESS OF BREATH 02/19/21   Jerrol Banana., MD  ?blood glucose meter kit and supplies KIT Just check first thing in the morning to ensure no low blood sugar. 07/24/21   Enzo Bi, MD  ?Blood Glucose Monitoring Suppl (ACCU-CHEK GUIDE) w/Device KIT Check blood sugars daily as directed 08/04/20   Jerrol Banana., MD  ?carbidopa-levodopa (SINEMET IR) 25-100 MG tablet Take 1 tablet by mouth 3 (three) times daily. Take 1 tablet three times a day 05/21/20   [provider]  ?Coenzyme Q10 (CO Q10) 200 MG CAPS Take 1 capsule by mouth daily. 08/09/11   [provider]  ?DULoxetine (CYMBALTA) 20 MG capsule Take 40 mg by mouth daily. ?Patient not taking: Reported on 10/09/2021    [provider]  ?DULoxetine (CYMBALTA) 30 MG capsule TAKE 1 CAPSULE(30 MG) BY MOUTH DAILY 07/28/21   Jerrol Banana., MD  ?empagliflozin (JARDIANCE) 10 MG TABS tablet Take by mouth daily.    [provider]  ?esomeprazole (NEXIUM) 40 MG capsule TAKE 1 CAPSULE BY MOUTH EVERY DAY 05/21/21   Birdie Sons, MD  ?fluticasone (FLONASE) 50 MCG/ACT nasal spray Place 2 sprays into both nostrils daily. 07/28/21   [name], PA-C  ?furosemide (LASIX) 40 MG tablet Take 1 tablet (40 mg total) by mouth daily. 07/24/21 10/22/21  Enzo Bi, MD  ?gabapentin (NEURONTIN) 600 MG tablet TAKE 1 TABLET(600 MG) BY MOUTH THREE TIMES DAILY 02/06/21   Jerrol Banana., MD  ?glimepiride  (AMARYL) 4 MG tablet TAKE 1 TABLET(4 MG) BY MOUTH DAILY BEFORE BREAKFAST 05/21/21   Birdie Sons, MD  ?glucose blood (ACCU-CHEK GUIDE) test strip Just check first thing in the morning to ensure no low blood sugar. 07/24/21   Enzo Bi, MD  ?hydroxypropyl methylcellulose (ISOPTO TEARS) 2.5 % ophthalmic solution Place 1 drop into both eyes 3 (three) times daily as needed. Dry eyes     [provider]  ?MAGNESIUM-OXIDE 400 (241.3 Mg) MG tablet TAKE 1 TABLET BY MOUTH TWICE DAILY 03/17/17   Jerrol Banana., MD  ?metoprolol succinate (TOPROL-XL) 25 MG 24 hr tablet Take 0.5 tablets (12.5 mg total) by mouth daily. 07/24/21 10/22/21  Enzo Bi, MD  ?MULTIPLE VITAMIN PO Take 1 tablet by mouth daily. 08/09/11  [provider]  ?mupirocin ointment (BACTROBAN) 2 % Apply topically 2 (two) times daily. 09/14/21   [provider]  ?pregabalin (LYRICA) 50 MG capsule Take 50 mg by mouth 2 (two) times daily. 10/06/21   [provider]  ?Probiotic Product (ALIGN PO) Take 1 capsule by mouth daily.    [provider]  ?rOPINIRole (REQUIP) 0.5 MG tablet Take 0.5 mg by mouth 3 (three) times daily. 10/10/12   [provider]  ?tamsulosin (FLOMAX) 0.4 MG CAPS capsule TAKE 1 CAPSULE(0.4 MG) BY MOUTH DAILY 09/28/21   Jerrol Banana., MD  ? ? ?Physical Exam: ?Vitals:  ? 10/09/21 1336 10/09/21 1400 10/09/21 1500 10/09/21 1600  ?BP: 134/81 120/71 132/74 130/74  ?Pulse: (!) 105 92 (!) 105 64  ?Resp: 20 (!) _0 ?Temp: (!) 102.4 ?F (39.1 ?C)     ?TempSrc: Rectal     ?SpO2:  92% 94% 97%  ?Weight:      ?Height:      ? ? ?Vitals:  ? 10/09/21 1336 10/09/21 1400 10/09/21 1500 10/09/21 1600  ?BP: 134/81 120/71 132/74 130/74  ?Pulse: (!) 105 92 (!) 105 64  ?Resp: 20 (!) _1 ?Temp: (!) 102.4 ?F (39.1 ?C)     ?TempSrc: Rectal     ?SpO2:  92% 94% 97%  ?Weight:      ?Height:      ? ?Constitutional: Uncomfortable appearing.  Appears frail.  Appears stated age ?ENMT: Mucous membranes are  moist.  Poor dentition.  ?Respiratory: Coarse crackles bilaterally.  Normal work of breathing.  4 L ?Cardiovascular: Tachycardic, regular rhythm, no murmurs ?Abdomen: Soft, NT/ND, normal bowel sounds ?[place]

## 2021-10-09 NOTE — ED Provider Notes (Signed)
? ?[facility] ?Provider Note ? ? ? Event Date/Time  ? First MD Initiated Contact with Patient 10/09/21 1321   ?  (approximate) ? ? ?History  ? ?Shortness of Breath and Fever ? ? ?HPI ? ?Renee Carpenter is a 88 y.o. female   history of CHF CKD diabetes presents the ER for evaluation of worsening shortness of breath fatigue altered female status over the past few days.  Has been compliant with her medications.  Denies any abdominal pain.  No chest pain does not wear home oxygen.  Brought in by EMS on 4 L nasal cannula found to be hypoxic at home.  Denies missing any doses. ? ?  ? ? ?Physical Exam  ? ?Triage Vital Signs: ?ED Triage Vitals  ?Enc Vitals Group  ?   BP 10/09/21 1336 134/81  ?   Pulse Rate 10/09/21 1336 (!) 105  ?   Resp 10/09/21 1336 20  ?   Temp 10/09/21 1336 (!) 102.4 ?F (39.1 ?C)  ?   Temp Source 10/09/21 1336 Rectal  ?   SpO2 --   ?   Weight 10/09/21 1335 159 lb 9.6 oz (72.4 kg)  ?   Height 10/09/21 1335 5\' 5"  (1.651 m)  ?   Head Circumference --   ?   Peak Flow --   ?   Pain Score --   ?   Pain Loc --   ?   Pain Edu? --   ?   Excl. in GC? --   ? ? ?Most recent vital signs: ?Vitals:  ? 10/09/21 1336  ?BP: 134/81  ?Pulse: (!) 105  ?Resp: 20  ?Temp: (!) 102.4 ?F (39.1 ?C)  ? ? ? ?Constitutional: Alert  ?Eyes: Conjunctivae are normal.  ?Head: Atraumatic. ?Nose: No congestion/rhinnorhea. ?Mouth/Throat: Mucous membranes are moist.   ?Neck: Painless ROM.  ?Cardiovascular:   Good peripheral circulation.  Well perfused mildly tachycardic ?Respiratory: Diminished breath sounds posteriorly inspiratory crackles anteriorly ?Gastrointestinal: Soft and nontender.  ?Musculoskeletal:  no deformity ?Neurologic:  MAE spontaneously. No gross focal neurologic deficits are appreciated.  ?Skin:  Skin is warm, dry and intact. No rash noted. ?Psychiatric: Mood and affect are normal. Speech and behavior are normal. ? ? ? ?ED Results / Procedures / Treatments  ? ?Labs ?(all labs ordered are listed, but  only abnormal results are displayed) ?Labs Reviewed  ?CBC - Abnormal; Notable for the following components:  ?    Result Value  ? RBC 3.03 (*)   ? Hemoglobin 11.7 (*)   ? HCT 35.0 (*)   ? MCV 115.5 (*)   ? MCH 38.6 (*)   ? RDW 19.2 (*)   ? nRBC 0.5 (*)   ? All other components within normal limits  ?COMPREHENSIVE METABOLIC PANEL - Abnormal; Notable for the following components:  ? Chloride 97 (*)   ? Glucose, Bld 176 (*)   ? BUN 29 (*)   ? Creatinine, Ser 1.69 (*)   ? Calcium 8.4 (*)   ? Albumin 3.4 (*)   ? GFR, Estimated 28 (*)   ? All other components within normal limits  ?BRAIN NATRIURETIC PEPTIDE - Abnormal; Notable for the following components:  ? B Natriuretic Peptide 1,191.4 (*)   ? All other components within normal limits  ?URINALYSIS, COMPLETE (UACMP) WITH MICROSCOPIC - Abnormal; Notable for the following components:  ? Color, Urine YELLOW (*)   ? APPearance CLOUDY (*)   ? Glucose, UA >=500 (*)   ?  Hgb urine dipstick LARGE (*)   ? Protein, ur 30 (*)   ? Leukocytes,Ua LARGE (*)   ? RBC / HPF >50 (*)   ? WBC, UA >50 (*)   ? Bacteria, UA MANY (*)   ? All other components within normal limits  ?TROPONIN I (HIGH SENSITIVITY) - Abnormal; Notable for the following components:  ? Troponin I (High Sensitivity) 129 (*)   ? All other components within normal limits  ?RESP PANEL BY RT-PCR (FLU A&B, COVID) [id number]  ?CULTURE, BLOOD (ROUTINE X 2)  ?CULTURE, BLOOD (ROUTINE X 2)  ?LACTIC ACID, PLASMA  ?LACTIC ACID, PLASMA  ?TROPONIN I (HIGH SENSITIVITY)  ? ? ? ?EKG ? ?ED ECG REPORT ?I, Willy Eddy, the attending physician, personally viewed and interpreted this ECG. ? ? Date: 10/09/2021 ? EKG Time: 13:32 ? Rate: 105 ? Rhythm: sinus ? Axis: left ? Intervals:lbbb ? ST&T Change: lbbb, no stemi ? ? ? ?RADIOLOGY ?Please see ED Course for my review and interpretation. ? ?I personally reviewed all radiographic images ordered to evaluate for the above acute complaints and reviewed radiology reports and findings.  These  findings were personally discussed with the patient.  Please see medical record for radiology report. ? ? ? ?PROCEDURES: ? ?Critical Care performed: Yes, see critical care procedure note(s) ? ?Procedures ? ? ?MEDICATIONS ORDERED IN ED: ?Medications  ?cefTRIAXone (ROCEPHIN) 2 g in sodium chloride 0.9 % 100 mL IVPB (2 g Intravenous New Bag/Given 10/09/21 1513)  ?azithromycin (ZITHROMAX) 500 mg in sodium chloride 0.9 % 250 mL IVPB (has no administration in time range)  ?acetaminophen (TYLENOL) tablet 650 mg (has no administration in time range)  ?furosemide (LASIX) injection 60 mg (60 mg Intravenous Given 10/09/21 1513)  ? ? ? ?IMPRESSION / MDM / ASSESSMENT AND PLAN / ED COURSE  ?I reviewed the triage vital signs and the nursing notes. ?             ?               ? ?Differential diagnosis includes, but is not limited to, Dehydration, sepsis, pna, uti, hypoglycemia, cva, drug effect, withdrawal, encephalitis ? ?Presented to ER for symptoms as described above.  Patient protecting her airway she is ill-appearing satting well and supplemental nasal cannula oxygen.  Exam is described above concerning for pneumonia sepsis metabolic derangement possible CHF exacerbation.  The patient will be placed on continuous pulse oximetry and telemetry for monitoring.  Laboratory evaluation will be sent to evaluate for the above complaints.   ? ? ?Clinical Course as of 10/09/21 1558  ?Fri Oct 09, 2021  ?1517 Receiving IV antibiotics for fever and probable sepsis lactate is normal no white count however urine does appear cloudy suspect UTI will give IV Lasix for pulmonary edema.  Troponin elevated think is likely secondary to demand ischemia in the setting of sepsis.  She denies any chest pain. [PR]  ?1551 Urinalysis consistent with UTI.  Hospitalist consulted for admission. [PR]  ?  ?Clinical Course User Index ?[PR] Willy Eddy, MD  ? ? ? ?FINAL CLINICAL IMPRESSION(S) / ED DIAGNOSES  ? ?Final diagnoses:  ?Sepsis with acute hypoxic  respiratory failure without septic shock, due to unspecified organism [facility])  ?Acute on chronic congestive heart failure, unspecified heart failure type (HCC)  ?Acute cystitis with hematuria  ? ? ? ?Rx / DC Orders  ? ?ED Discharge Orders   ? ?      Ordered  ?  DNR (Do Not Resuscitate)       ?  10/09/21 1544  ? ?  ?  ? ?  ? ? ? ?Note:  This document was prepared using Dragon voice recognition software and may include unintentional dictation errors. ? ?  ?Willy Eddy, MD ?10/09/21 1558 ? ?

## 2021-10-10 ENCOUNTER — Inpatient Hospital Stay: Payer: Medicare PPO

## 2021-10-10 DIAGNOSIS — I509 Heart failure, unspecified: Secondary | ICD-10-CM | POA: Diagnosis not present

## 2021-10-10 LAB — CBG MONITORING, ED
Glucose-Capillary: 143 mg/dL — ABNORMAL HIGH (ref 70–99)
Glucose-Capillary: 197 mg/dL — ABNORMAL HIGH (ref 70–99)
Glucose-Capillary: 204 mg/dL — ABNORMAL HIGH (ref 70–99)

## 2021-10-10 LAB — TROPONIN I (HIGH SENSITIVITY)
Troponin I (High Sensitivity): 108 ng/L (ref <18)
Troponin I (High Sensitivity): 146 ng/L (ref <18)
Troponin I (High Sensitivity): 147 ng/L (ref <18)
Troponin I (High Sensitivity): 148 ng/L (ref <18)

## 2021-10-10 LAB — CBC WITH DIFFERENTIAL/PLATELET
Abs Immature Granulocytes: 0.12 10*3/uL — ABNORMAL HIGH (ref 0.00–0.07)
Basophils Absolute: 0.1 10*3/uL (ref 0.0–0.1)
Basophils Relative: 0 %
Eosinophils Absolute: 0 10*3/uL (ref 0.0–0.5)
Eosinophils Relative: 0 %
HCT: 38.4 % (ref 36.0–46.0)
Hemoglobin: 13.1 g/dL (ref 12.0–15.0)
Immature Granulocytes: 1 %
Lymphocytes Relative: 9 %
Lymphs Abs: 1.2 10*3/uL (ref 0.7–4.0)
MCH: 39.5 pg — ABNORMAL HIGH (ref 26.0–34.0)
MCHC: 34.1 g/dL (ref 30.0–36.0)
MCV: 115.7 fL — ABNORMAL HIGH (ref 80.0–100.0)
Monocytes Absolute: 0.9 10*3/uL (ref 0.1–1.0)
Monocytes Relative: 7 %
Neutro Abs: 11.3 10*3/uL — ABNORMAL HIGH (ref 1.7–7.7)
Neutrophils Relative %: 83 %
Platelets: 192 10*3/uL (ref 150–400)
RBC: 3.32 MIL/uL — ABNORMAL LOW (ref 3.87–5.11)
RDW: 19.4 % — ABNORMAL HIGH (ref 11.5–15.5)
Smear Review: NORMAL
WBC: 13.5 10*3/uL — ABNORMAL HIGH (ref 4.0–10.5)
nRBC: 0.2 % (ref 0.0–0.2)

## 2021-10-10 LAB — BASIC METABOLIC PANEL
Anion gap: 12 (ref 5–15)
BUN: 34 mg/dL — ABNORMAL HIGH (ref 8–23)
CO2: 30 mmol/L (ref 22–32)
Calcium: 8.3 mg/dL — ABNORMAL LOW (ref 8.9–10.3)
Chloride: 94 mmol/L — ABNORMAL LOW (ref 98–111)
Creatinine, Ser: 2.03 mg/dL — ABNORMAL HIGH (ref 0.44–1.00)
GFR, Estimated: 22 mL/min — ABNORMAL LOW (ref >60)
Glucose, Bld: 192 mg/dL — ABNORMAL HIGH (ref 70–99)
Potassium: 4.5 mmol/L (ref 3.5–5.1)
Sodium: 136 mmol/L (ref 135–145)

## 2021-10-10 LAB — GLUCOSE, CAPILLARY
Glucose-Capillary: 237 mg/dL — ABNORMAL HIGH (ref 70–99)
Glucose-Capillary: 243 mg/dL — ABNORMAL HIGH (ref 70–99)

## 2021-10-10 LAB — LACTIC ACID, PLASMA: Lactic Acid, Venous: 1.5 mmol/L (ref 0.5–1.9)

## 2021-10-10 LAB — PROCALCITONIN: Procalcitonin: 0.11 ng/mL

## 2021-10-10 MED ORDER — OXYCODONE HCL 5 MG PO TABS
5.0000 mg | ORAL_TABLET | ORAL | Status: DC | PRN
Start: 2021-10-10 — End: 2021-10-19
  Administered 2021-10-12 – 2021-10-14 (×5): 5 mg via ORAL
  Filled 2021-10-10 (×4): qty 1

## 2021-10-10 MED ORDER — MIDODRINE HCL 5 MG PO TABS
10.0000 mg | ORAL_TABLET | Freq: Three times a day (TID) | ORAL | Status: DC
Start: 2021-10-10 — End: 2021-10-12
  Administered 2021-10-10: 5 mg via ORAL
  Administered 2021-10-11 (×3): 10 mg via ORAL
  Filled 2021-10-10 (×5): qty 2

## 2021-10-10 MED ORDER — METHYLPREDNISOLONE SODIUM SUCC 40 MG IJ SOLR
20.0000 mg | Freq: Three times a day (TID) | INTRAMUSCULAR | Status: DC
  Administered 2021-10-10 – 2021-10-12 (×6): 20 mg via INTRAVENOUS
  Filled 2021-10-10 (×6): qty 1

## 2021-10-10 MED ORDER — SODIUM CHLORIDE 0.9 % IV SOLN
2.0000 g | INTRAVENOUS | Status: AC
  Administered 2021-10-10 – 2021-10-14 (×5): 2 g via INTRAVENOUS
  Filled 2021-10-10: qty 12.5
  Filled 2021-10-10 (×2): qty 2
  Filled 2021-10-10 (×3): qty 12.5

## 2021-10-10 MED ORDER — MIDODRINE HCL 5 MG PO TABS
5.0000 mg | ORAL_TABLET | Freq: Three times a day (TID) | ORAL | Status: DC
  Administered 2021-10-10: 5 mg via ORAL
  Filled 2021-10-10: qty 1

## 2021-10-10 MED ORDER — HYDROCORTISONE SOD SUC (PF) 100 MG IJ SOLR: 100.0000 mg | Freq: Three times a day (TID) | INTRAMUSCULAR | Status: DC

## 2021-10-10 MED ORDER — ALBUMIN HUMAN 25 % IV SOLN
12.5000 g | Freq: Once | INTRAVENOUS | Status: AC
  Administered 2021-10-10: 12.5 g via INTRAVENOUS
  Filled 2021-10-10: qty 50

## 2021-10-10 MED ORDER — VANCOMYCIN HCL 1500 MG/300ML IV SOLN
1500.0000 mg | Freq: Once | INTRAVENOUS | Status: AC
  Administered 2021-10-10: 1500 mg via INTRAVENOUS
  Filled 2021-10-10: qty 300

## 2021-10-10 MED ORDER — ACETAMINOPHEN 325 MG PO TABS
650.0000 mg | ORAL_TABLET | ORAL | Status: DC | PRN
  Administered 2021-10-10 – 2021-10-19 (×4): 650 mg via ORAL
  Filled 2021-10-10 (×4): qty 2

## 2021-10-10 MED ORDER — CHLORHEXIDINE GLUCONATE CLOTH 2 % EX PADS
6.0000 | MEDICATED_PAD | Freq: Every day | CUTANEOUS | Status: DC
  Administered 2021-10-10 – 2021-10-19 (×10): 6 via TOPICAL

## 2021-10-10 NOTE — ED Notes (Signed)
This nurse messaged NP morrison stating patient has changed over the night. And that say she is breathing more labored and tachypnea than before.  This nurse also believes her mentation has changed as well.  ?

## 2021-10-10 NOTE — Progress Notes (Signed)
Pharmacy Antibiotic Note ? ?Renee Carpenter is a 88 y.o. female admitted on 10/09/2021 with sepsis.  Pharmacy has been consulted for vancomycin and cefepime dosing. ? ?Baseline SCR appears to be 1.2-1.4. Source unclear. Hypoxic on 4 L [place], febrile with TMAX 101.5, altered mental status and UA pyuria. Chest xray no evidence of acute cardiopulmonary disease. ? ?Plan: ?Vancomycin loading dose 1500 mg x 1. Will dose further levels based on renal function. ? ?Cefepime 2 grams every 24 hours ? ?Follow up MRSA PCR and renal function. ? ?Height: 5\' 5"  (165.1 cm) ?Weight: 72.4 kg (159 lb 9.6 oz) ?IBW/kg (Calculated) : 57 ? ?Temp (24hrs), Avg:100.1 ?F (37.8 ?C), Min:97.8 ?F (36.6 ?C), Max:102.4 ?F (39.1 ?C) ? ?Recent Labs  ?Lab 10/08/21 ?1400 10/09/21 ?1345 10/09/21 ?1542 10/10/21 ?0416  ?WBC  --  6.4  --  13.5*  ?CREATININE 1.89* 1.69*  --  2.03*  ?LATICACIDVEN  --  1.5 1.2  --   ?  ?Estimated Creatinine Clearance: 17.3 mL/min (A) (by C-G formula based on SCr of 2.03 mg/dL (H)).   ? ?Allergies  ?Allergen Reactions  ? Acetaminophen Itching and Other (See Comments)  ?  Stomach upset  ? Aspirin Itching and Other (See Comments)  ?  Stomach pain  ? Metformin And Related   ?  nausea  ? Other Other (See Comments)  ?  Novocaine, strange feeling  ? Sulfa Antibiotics Other (See Comments)  ? Sulfasalazine   ?  Other reaction(s): Other (See Comments)  ? Tizanidine   ?  Diarrhea, almost passed out, felt dizzy and confused  ? Caffeine Palpitations  ? Ginger Palpitations  ? Ibuprofen Rash  ? ? ?Antimicrobials this admission: ?5/5 ceftriaxone >> 5/5 ?5/5 azithromycin >>  ? ?Dose adjustments this admission: ?N/a ? ?Microbiology results: ?5/5 BCx: NG < 24 hours ?5/5 MRSA PCR: ordered ? ?Thank you for allowing pharmacy to be a part of this patient?s care. ? ?12/10/21, PharmD ?Pharmacy Resident  ?10/10/2021 ?9:32 AM ?

## 2021-10-10 NOTE — Progress Notes (Signed)
OT Cancellation Note ? ?Patient Details ?Name: Dakari Stabler Orser ?MRN: [id number] ?DOB: [birth year] ? ? ?Cancelled Treatment:    Reason Eval/Treat Not Completed: Patient not medically ready;Other (comment) (Pt with low BP, high temp, increased RR;  Per MD ok to hold for today, OT will re-attempt tomorrow as able.) ? ?Oleta Mouse, OTD OTR/L  ?10/10/21, 1:29 PM  ?

## 2021-10-10 NOTE — TOC Progression Note (Signed)
Transition of Care (TOC) - Progression Note  ? ? ?Patient Details  ?Name: Renee Carpenter ?MRN: [id number] ?Date of Birth: July 31, 1925 ? ?Transition of Care (TOC) CM/SW Contact  ?Bing Quarry, RN ?Phone Number: ?10/10/2021, 3:37 PM ? ?Clinical Narrative:  5/6: Heart failure clinical resources for patient education placed in AVS documentation. Gabriel Cirri RN CM   ? ? ? ?  ?  ? ?Expected Discharge Plan and Services ?  ?  ?  ?  ?  ?                ?  ?  ?  ?  ?  ?  ?  ?  ?  ?  ? ? ?Social Determinants of Health (SDOH) Interventions ?  ? ?Readmission Risk Interventions ?   ? View : No data to display.  ?  ?  ?  ? ? ?

## 2021-10-10 NOTE — ED Notes (Signed)
Bladder scanner showed greater than 999 ?

## 2021-10-10 NOTE — Consult Note (Signed)
[facility] Cardiology ? ?CARDIOLOGY CONSULT NOTE  ?Patient ID: ?[name] ?MRN: [id number] ?DOB/AGE: [birth year] 89 y.o. ? ?Admit date: 10/09/2021 ?Referring Physician Ralene Muskrat ?Primary Physician Jerrol Banana., MD ?Primary Cardiologist Nehemiah Massed ?Reason for Consultation AoCHF ? ?HPI:  ?Renee Carpenter is a 88 year old female with history of CHF (EF25-30%), chronic left bundle branch block, CKD 3, hypertension, hyperlipidemia was admitted with shortness of breath, fatigue, altered mental status. ? ?Reportedly the patient has been complaining of lower abdominal pain, and was found to be confused at home.  EMS was called at the time of arrival required 4 L of nasal cannula oxygen.  On arrival the patient was febrile, heart rate approximately 100, and relative hypotension in the 80s over 50s.  Given her hypoxia and chest x-ray showing interstitial edema she was given 60 of IV Lasix with good urine output of approximately 1300 cc.  BNP 1191 (decreased from 2 months ago of 2000).  Troponin 147 and flat.  Urinalysis consistent with infection with large leukocytes and many bacteria.  She was started on broad-spectrum antibiotics. ? ?At the time of my evaluation she is sleeping, and does not wake to voice. Her caretaker is at bedside who says she has been resting comfortably overnight. She was quite restless and confused yesterday.  ? ?Review of systems complete and found to be negative unless listed above  ? ? ? ?Past Medical History:  ?Diagnosis Date  ? Anemia   ? WAS ANEMIC  ? Arthritis   ? Blood transfusion   ? HAD 1 SEVERAL YRS AGO...3-4 YR  ? CHF (congestive heart failure) ([place])   ? Chronic kidney disease   ? SOME DECREASE IN KIDNEY FUNCTION  ? Diabetes mellitus without complication ([place])   ? Hypertension   ? Normal cardiac stress test   ? REQUESTING [place]  ? Normal echocardiogram   ? REQUESTING THAT RESULT -- DR [name]   ? PONV (postoperative nausea and vomiting)   ? Shortness of breath    ? Sleep apnea   ? 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  ?Past Surgical History:  ?Procedure Laterality Date  ? ABDOMINAL HYSTERECTOMY    ? BACK SURGERY    ? 2 CERVICAL SURGERIES ALSO  ? CATARACTS    ? RIGHT EYE CATARACT  ? CERVICAL DISCECTOMY  08/1995 and 2011  ? C4-,C5-6, C6-7 anterior cervical; Dr. Ellene Route fusion and plating  ? LAMINECTOMY    ? decompressive; Dr. Burman Riis in [place] MICRODISCECTOMY  04/14/2011  ? Procedure: LUMBAR LAMINECTOMY/DECOMPRESSION MICRODISCECTOMY;  Surgeon: [name];  Location: [facility];  Service: Neurosurgery;  Laterality: N/A;  Lumbar Three and Lumbar Four Laminectomy  ? TONSILLECTOMY AND ADENOIDECTOMY  1939  ?  ?(Not in a hospital admission) ? ?Social History  ? ?Socioeconomic History  ? Marital status: Widowed  ?  Spouse name: Not on file  ? Number of children: 1  ? Years of education: Not on file  ? Highest education level: Bachelor's degree (e.g., BA, AB, BS)  ?Occupational History  ? Occupation: retired  ?Tobacco Use  ? Smoking status: Never  ? Smokeless tobacco: Never  ?Substance and Sexual Activity  ? Alcohol use: No  ? Drug use: No  ? Sexual activity: Never  ?Other Topics Concern  ? Not on file  ?Social History Narrative  ? Not on file  ? ?Social Determinants of Health  ? ?Financial Resource Strain: Not on file  ?Food Insecurity: Not on file  ?Transportation  Needs: Not on file  ?Physical Activity: Not on file  ?Stress: Not on file  ?Social Connections: Not on file  ?Intimate Partner Violence: Not on file  ?  ?Family History  ?Problem Relation Age of Onset  ? Heart failure Father   ? Heart disease Father   ? Hypertension Mother   ? Diabetes Mother   ? Heart disease Mother   ? Cancer Sister   ?     breast  ? Breast cancer Sister 68  ? Cancer Maternal Grandfather   ? Cancer Maternal Aunt   ?     x3  ? Breast cancer Daughter 42  ?  ? ? ?Review of systems complete and found to be negative unless listed above  ? ? ? ? ?PHYSICAL EXAM ? ?General:  Elderly frail appearing female, sleeping. Mouth open.  ?HEENT:  Normocephalic and atramatic. [place] oxygen in place ?Neck:  No JVD.  ?Lungs: Clear bilaterally to auscultation and percussion. ?Heart: HRRR . Normal S1 and S2 without gallops or murmurs.  ?Abdomen: Bowel sounds are positive, abdomen soft and non-tender  ?Msk:  Back normal, normal gait. Normal strength and tone for age. ?Extremities: No clubbing, cyanosis or edema.   ?Neuro: Alert and oriented X 3. ?Psych:  Sleeping. Does not easily awaken.  ? ?Labs: ?  ?Lab Results  ?Component Value Date  ? WBC 13.5 (H) 10/10/2021  ? HGB 13.1 10/10/2021  ? HCT 38.4 10/10/2021  ? MCV 115.7 (H) 10/10/2021  ? PLT 192 10/10/2021  ?  ?Recent Labs  ?Lab 10/09/21 ?1345 10/10/21 ?0416  ?NA 137 136  ?K 4.1 4.5  ?CL 97* 94*  ?CO2 32 30  ?BUN 29* 34*  ?CREATININE 1.69* 2.03*  ?CALCIUM 8.4* 8.3*  ?PROT 6.8  --   ?BILITOT 0.9  --   ?ALKPHOS 69  --   ?ALT 7  --   ?AST 28  --   ?GLUCOSE 176* 192*  ? ?No results found for: CKTOTAL, CKMB, CKMBINDEX, TROPONINI  ?Lab Results  ?Component Value Date  ? CHOL 149 08/26/2017  ? CHOL 142 02/27/2016  ? CHOL 149 01/15/2015  ? ?Lab Results  ?Component Value Date  ? HDL 47 08/26/2017  ? HDL 64 02/27/2016  ? HDL 51 01/15/2015  ? ?Lab Results  ?Component Value Date  ? [place] 74 08/26/2017  ? [place] 54 02/27/2016  ? [place] 67 01/15/2015  ? ?Lab Results  ?Component Value Date  ? TRIG 141 08/26/2017  ? TRIG 120 02/27/2016  ? TRIG 155 (H) 01/15/2015  ? ?Lab Results  ?Component Value Date  ? CHOLHDL 3.2 08/26/2017  ? ?No results found for: LDLDIRECT  ?  ?Radiology: [facility] Chest Port 1 View ? ?Result Date: 10/10/2021 ?CLINICAL DATA:  88 year old female with history of hypoxia and shortness of breath. EXAM: PORTABLE CHEST 1 VIEW COMPARISON:  Chest x-ray 10/09/2021. FINDINGS: Lung volumes are normal. No consolidative airspace disease. No pleural effusions. No pneumothorax. No pulmonary nodule or mass noted. Pulmonary vasculature and the cardiomediastinal silhouette  are within normal limits. Atherosclerosis in the thoracic aorta. Orthopedic fixation hardware in the lower cervical spine incompletely imaged. IMPRESSION: 1.  No radiographic evidence of acute cardiopulmonary disease. 2. Aortic atherosclerosis. Electronically Signed   By: Vinnie Langton M.D.   On: 10/10/2021 08:21  ? ?DG Chest Portable 1 View ? ?Result Date: 10/09/2021 ?CLINICAL DATA:  Shortness of breath.  Evaluate for edema. EXAM: PORTABLE CHEST 1 VIEW COMPARISON:  AP chest 07/22/2021, 07/20/2021; chest two views 09/10/2020 FINDINGS:  Cardiac silhouette is again mildly enlarged. Mediastinal contours are within normal limits with mild calcification again seen within the aortic arch. Apparent resolution of the prior small right pleural effusion. Marked interval decrease in possible resolution of the prior moderate left pleural effusion. Mild bilateral interstitial thickening is similar to 07/22/2021 and 07/20/2021 but new from 09/10/2020. No pneumothorax. Moderate multilevel degenerative disc changes of the thoracic spine. Moderate to severe right and mild left glenohumeral osteoarthritis. IMPRESSION:: IMPRESSION: 1. Mild cardiomegaly. Likely resolution of the prior moderate left and small right pleural effusions. 2. Persistent mild interstitial thickening as on 07/22/2021 but new from 09/10/2020 suggesting mild interstitial pulmonary edema. Electronically Signed   By: Yvonne Kendall M.D.   On: 10/09/2021 13:48   ? ?Echo 07/2021-  1. Left ventricular ejection fraction, by estimation, is 25 to 30%. The  ?left ventricle has severely decreased function. The left ventricle  ?demonstrates global hypokinesis. The left ventricular internal cavity size  ?was mildly to moderately dilated. Left  ?ventricular diastolic parameters are consistent with Grade I diastolic  ?dysfunction (impaired relaxation).  ? 2. Right ventricular systolic function is normal. The right ventricular  ?size is normal.  ? 3. Left atrial size was  moderately dilated.  ? 4. Right atrial size was moderately dilated.  ? 5. The mitral valve is normal in structure. Moderate to severe mitral  ?valve regurgitation.  ? 6. Tricuspid valve regurgitation is mild to m

## 2021-10-10 NOTE — Progress Notes (Signed)
PT Cancellation Note ? ?Patient Details ?Name: Renee Carpenter ?MRN: [id number] ?DOB: [birth year] ? ? ?Cancelled Treatment:    Reason Eval/Treat Not Completed: Patient not medically ready Patient orders received and chart reviewed. Due to patient's low BP, high temp, and RN deferring therapy will hold on PT at this time, and will re-attempt at a later time/date as available and patient medically appropriate for PT. Thank you! ? ? ?Angelica Ran, PT  ?10/10/21. 12:31 PM ? ?

## 2021-10-10 NOTE — Progress Notes (Signed)
Pt's MEWS score has been high at baseline since admission, MD aware of the high score and interventions in place, palliative consulted, family at bedside. ?

## 2021-10-10 NOTE — Progress Notes (Signed)
?PROGRESS NOTE ? ? ? ?Renee Carpenter  [id number] DOB: 06-04-1926 DOA: 10/09/2021 ?PCP: Jerrol Banana., MD  ? ? ?Brief Narrative:  ?88 y.o. female with medical history significant of systolic congestive heart failure EF 25 to 30%, CKD stage IIIa, type 2 diabetes mellitus, parkinsonism, obstructive sleep apnea, hypertension presents for evaluation of progressively worsening shortness of breath associated with fatigue and altered mentation.  [name] is compliance with all medications.  No abdominal pain ?  ?No home oxygen requirement.  On presentation brought in by EMS on 4 L nasal cannula after being found hypoxic at home.  Patient is a poor historian.  History obtained from multiple sources including the patient, patient's husband, ER physician, ER documentation. ?  ?On my evaluation patient is uncomfortable appearing.  Difficult to obtain history from.  Per husband at bedside patient is complaining of lower abdominal pain.  Urine drawn on admission is concerning for urinary tract infection although patient is afebrile with normal lactic acidosis and no leukocytosis.  Patient was febrile with Tmax 102.4.  Patient has lab work including elevated BNP and chest x-ray that demonstrates evidence of interstitial edema. ? ?Alternates between agitated and lethargic.  Mental status remains poor.  No change in oxygen requirement.  Breakthrough fevers noted ? ? ?Assessment & Plan: ?  ?Principal Problem: ?  Acute decompensated heart failure ([place]) ? ?Acute decompensated systolic congestive heart failure ?Known recent EF from February 14 was 25 to 30% ?Patient is on home Lasix, endorses compliance ?Suspect that heart failure exacerbation occurred as a result of urinary tract infection ?No lower extremity edema, interstitial edema on CXR ?Received 60 IV Lasix in ED ?UOP reassuring ?Kidney function subsequently worsened ?Plan: ?Defer Lasix for today ?Defer repeat echocardiogram ?Continue ins and outs and daily  weights ?Continue telemetry monitoring ?Cardiology following, recommendations appreciated ?  ?Acute hypoxic respiratory failure ?Patient on room air at baseline ?Presents with a 4 L requirement ?Saturating low 90s on 4 L ?Suspect secondary to interstitial edema versus possible infectious process versus OSA ?Plan: ?Continue oxygen via nasal cannula ?Target oxygen saturation greater than 92% ?Wean oxygen as tolerated ?  ?Elevated troponin ?No chest pain ?Likely demand ischemia ?Plan: ?Continue telemetry ?  ?AKI on CKD stage IIIb ?Baseline creatinine difficult ascertain ?Appears to be approximately 1.2-1.3 ?1.69 on presentation ?Subsequently worsened to 2 ?Plan: ?Diuresis on hold ?Hold gabapentin and Lyrica ?Daily kidney function ?Avoid nonessential nephrotoxins ?  ?Urinary tract infection ?Sepsis secondary to above ?Bladder spasms ?Unable to exclude sepsis ?Urinalysis concerning for infection ?Patient with fever Tmax 102.4 ?Started on Rocephin and azithromycin, breakthrough fevers noted ?Question regarding alternative source of infection ?Plan: ?DC Rocephin ?Start vancomycin and cefepime with pharmacy dosing assistance ?Follow blood and urine cultures ?Monitor vitals and fever curve ?No IV fluids considering decompensated heart failure ?  ?Type 2 diabetes mellitus with hyperglycemia and neuropathic pain ?Hold oral agents ?Moderate sliding scale ?Carb modified diet ?PTA gabapentin on hold ?  ?Obstructive sleep apnea ?Patient on home CPAP but compliance poor due to difficulty with mask ?Nightly CPAP ?  ?Parkinsonism ?Restless leg ?PTA 3 times daily Sinemet ?PTA 3 times daily Requip ?  ?GERD. ?PPI ?  ?Depression ?Anxiety ?PTA Cymbalta ?As needed Xanax ? ? ?DVT prophylaxis: SQ Lovenox ?Code Status: DNR ?Family Communication: Husband and caregiver at bedside 5/6 ?Disposition Plan: Status is: Inpatient ?Remains inpatient appropriate because: Sepsis, acute decompensated heart failure, acute metabolic  encephalopathy ? ? ?Level of care: Progressive ? ?Consultants:  ?Cardiology ?Palliative  care ? ?Procedures:  ?None ? ?Antimicrobials: ?Vancomycin ?Cefepime ?Azithromycin ? ? ?Subjective: ?Seen and examined.  Lethargic.  Appears uncomfortable.  Does answer questions ? ?Objective: ?Vitals:  ? 10/10/21 1227 10/10/21 1230 10/10/21 1300 10/10/21 1330  ?BP:  (!) 91/49 (!) 93/45 (!) 90/52  ?Pulse:  86 84 81  ?Resp:  (!) 32 (!) 28 (!) 28  ?Temp: 99.7 ?F (37.6 ?C)     ?TempSrc: Rectal     ?SpO2:  95% 95% 97%  ?Weight:      ?Height:      ? ? ?Intake/Output Summary (Last 24 hours) at 10/10/2021 1343 ?Last data filed at 10/10/2021 1333 ?Gross per 24 hour  ?Intake 500 ml  ?Output 1300 ml  ?Net -800 ml  ? ?Filed Weights  ? 10/09/21 1335  ?Weight: 72.4 kg  ? ? ?Examination: ? ?General exam: Uncomfortable appearing.  Appears frail and chronically ill ?Respiratory system: Bibasilar crackles.  Normal work of breathing.  3 L ?Cardiovascular system: S1-S2, regular rhythm, no murmurs ?Gastrointestinal system: Soft, NT/ND, normal bowel sounds ?Central nervous system: Lethargic, oriented x1 ?Extremities: Diffusely decreased power bilaterally ?Skin: No rashes, lesions or ulcers ?Psychiatry: Judgement and insight appear impaired. Mood & affect confused.  ? ? ? ?Data Reviewed: I have personally reviewed following labs and imaging studies ? ?CBC: ?Recent Labs  ?Lab 10/09/21 ?1345 10/10/21 ?0416  ?WBC 6.4 13.5*  ?NEUTROABS  --  11.3*  ?HGB 11.7* 13.1  ?HCT 35.0* 38.4  ?MCV 115.5* 115.7*  ?PLT 207 192  ? ?Basic Metabolic Panel: ?Recent Labs  ?Lab 10/08/21 ?1400 10/09/21 ?1345 10/10/21 ?0416  ?NA 139 137 136  ?K 3.9 4.1 4.5  ?CL 97* 97* 94*  ?CO2 33* 32 30  ?GLUCOSE 141* 176* 192*  ?BUN 30* 29* 34*  ?CREATININE 1.89* 1.69* 2.03*  ?CALCIUM 8.5* 8.4* 8.3*  ? ?GFR: ?Estimated Creatinine Clearance: 17.3 mL/min (A) (by C-G formula based on SCr of 2.03 mg/dL (H)). ?Liver Function Tests: ?Recent Labs  ?Lab 10/09/21 ?1345  ?AST 28  ?ALT 7  ?ALKPHOS 69   ?BILITOT 0.9  ?PROT 6.8  ?ALBUMIN 3.4*  ? ?No results for input(s): LIPASE, AMYLASE in the last 168 hours. ?No results for input(s): AMMONIA in the last 168 hours. ?Coagulation Profile: ?No results for input(s): INR, PROTIME in the last 168 hours. ?Cardiac Enzymes: ?No results for input(s): CKTOTAL, CKMB, CKMBINDEX, TROPONINI in the last 168 hours. ?BNP (last 3 results) ?No results for input(s): PROBNP in the last 8760 hours. ?HbA1C: ?No results for input(s): HGBA1C in the last 72 hours. ?CBG: ?Recent Labs  ?Lab 10/09/21 ?1935 10/10/21 ?0129 10/10/21 ?[id number] 10/10/21 ?1220  ?GLUCAP 169* [place] ?Lipid Profile: ?No results for input(s): CHOL, HDL, LDLCALC, TRIG, CHOLHDL, LDLDIRECT in the last 72 hours. ?Thyroid Function Tests: ?No results for input(s): TSH, T4TOTAL, FREET4, T3FREE, THYROIDAB in the last 72 hours. ?Anemia Panel: ?No results for input(s): VITAMINB12, FOLATE, FERRITIN, TIBC, IRON, RETICCTPCT in the last 72 hours. ?Sepsis Labs: ?Recent Labs  ?Lab 10/09/21 ?1345 10/09/21 ?1542 10/09/21 ?1700 10/10/21 ?0416  ?PROCALCITON  --   --  <0.10 0.11  ?LATICACIDVEN 1.5 1.2  --   --   ? ? ?Recent Results (from the past 240 hour(s))  ?Resp Panel by RT-PCR (Flu A&B, Covid) Nasopharyngeal Swab     Status: None  ? Collection Time: 10/09/21  1:45 PM  ? Specimen: Nasopharyngeal Swab; Nasopharyngeal(NP) swabs in vial transport medium  ?Result Value Ref Range Status  ? SARS Coronavirus 2 by  RT PCR NEGATIVE NEGATIVE Final  ?  Comment: (NOTE) ?SARS-CoV-2 target nucleic acids are NOT DETECTED. ? ?The SARS-CoV-2 RNA is generally detectable in upper respiratory ?specimens during the acute phase of infection. The lowest ?concentration of SARS-CoV-2 viral copies this assay can detect is ?138 copies/mL. A negative result does not preclude SARS-Cov-2 ?infection and should not be used as the sole basis for treatment or ?other patient management decisions. A negative result may occur with  ?improper specimen  collection/handling, submission of specimen other ?than nasopharyngeal swab, presence of viral mutation(s) within the ?areas targeted by this assay, and inadequate number of viral ?copies(<138 copies/mL). A negative result must be combined with ?clin

## 2021-10-10 NOTE — Progress Notes (Signed)
[place]  ?Nurse reported slightly lower blood pressures and lower abd distension and pain. Bladder scan with greater than 1 liter. ?In and OUT cath ordered ? ?

## 2021-10-10 NOTE — Significant Event (Addendum)
BP dropped to 73/40, concerning for shock physiology in setting of sepsis ? ?Plan: ?Midodrine 10 TID ?Stress dose steroids (sub solumedrol due to shortage of solucortef) ?Albumin bolus ?Check LA ? ?Patient high risk for further decompensation.  Low threshold for transfer to higher level of care ? ?Lolita Patella MD ? ?Update: Repeat MAP improved.  LA 1.5.  Maintain PCU status for now ?

## 2021-10-11 DIAGNOSIS — I509 Heart failure, unspecified: Secondary | ICD-10-CM | POA: Diagnosis not present

## 2021-10-11 LAB — GLUCOSE, CAPILLARY
Glucose-Capillary: 210 mg/dL — ABNORMAL HIGH (ref 70–99)
Glucose-Capillary: 267 mg/dL — ABNORMAL HIGH (ref 70–99)
Glucose-Capillary: 212 mg/dL — ABNORMAL HIGH (ref 70–99)
Glucose-Capillary: 200 mg/dL — ABNORMAL HIGH (ref 70–99)

## 2021-10-11 LAB — CBC WITH DIFFERENTIAL/PLATELET
Abs Immature Granulocytes: 0.1 10*3/uL — ABNORMAL HIGH (ref 0.00–0.07)
Basophils Absolute: 0 10*3/uL (ref 0.0–0.1)
Basophils Relative: 0 %
Eosinophils Absolute: 0 10*3/uL (ref 0.0–0.5)
Eosinophils Relative: 0 %
HCT: 32.9 % — ABNORMAL LOW (ref 36.0–46.0)
Hemoglobin: 11.3 g/dL — ABNORMAL LOW (ref 12.0–15.0)
Immature Granulocytes: 1 %
Lymphocytes Relative: 9 %
Lymphs Abs: 1.2 10*3/uL (ref 0.7–4.0)
MCH: 39.2 pg — ABNORMAL HIGH (ref 26.0–34.0)
MCHC: 34.3 g/dL (ref 30.0–36.0)
MCV: 114.2 fL — ABNORMAL HIGH (ref 80.0–100.0)
Monocytes Absolute: 0.3 10*3/uL (ref 0.1–1.0)
Monocytes Relative: 2 %
Neutro Abs: 11 10*3/uL — ABNORMAL HIGH (ref 1.7–7.7)
Neutrophils Relative %: 88 %
Platelets: 179 10*3/uL (ref 150–400)
RBC: 2.88 MIL/uL — ABNORMAL LOW (ref 3.87–5.11)
RDW: 19.2 % — ABNORMAL HIGH (ref 11.5–15.5)
WBC: 12.6 10*3/uL — ABNORMAL HIGH (ref 4.0–10.5)
nRBC: 0 % (ref 0.0–0.2)

## 2021-10-11 LAB — URINALYSIS, COMPLETE (UACMP) WITH MICROSCOPIC
Bilirubin Urine: NEGATIVE
Glucose, UA: >=500 mg/dL — AB
Ketones, ur: NEGATIVE mg/dL
Nitrite: NEGATIVE
Protein, ur: 100 mg/dL — AB
RBC / HPF: >50 RBC/hpf — ABNORMAL HIGH (ref 0–5)
Specific Gravity, Urine: 1.02 (ref 1.005–1.030)
WBC, UA: >50 WBC/hpf — ABNORMAL HIGH (ref 0–5)
pH: 5 (ref 5.0–8.0)

## 2021-10-11 LAB — BASIC METABOLIC PANEL
Anion gap: 14 (ref 5–15)
BUN: 48 mg/dL — ABNORMAL HIGH (ref 8–23)
CO2: 28 mmol/L (ref 22–32)
Calcium: 8.1 mg/dL — ABNORMAL LOW (ref 8.9–10.3)
Chloride: 97 mmol/L — ABNORMAL LOW (ref 98–111)
Creatinine, Ser: 2.37 mg/dL — ABNORMAL HIGH (ref 0.44–1.00)
GFR, Estimated: 19 mL/min — ABNORMAL LOW (ref >60)
Glucose, Bld: 229 mg/dL — ABNORMAL HIGH (ref 70–99)
Potassium: 3.9 mmol/L (ref 3.5–5.1)
Sodium: 139 mmol/L (ref 135–145)

## 2021-10-11 LAB — MRSA NEXT GEN BY PCR, NASAL: MRSA by PCR Next Gen: NOT DETECTED

## 2021-10-11 NOTE — Evaluation (Signed)
Occupational Therapy Evaluation ?Patient Details ?Name: Renee Carpenter ?MRN: [id number] ?DOB: 1926-01-29 ?Today's Date: 10/11/2021 ? ? ?History of Present Illness Pt is a 88 y/o F admitted on 10/09/21 after presenting with c/o worsening SOB associated with fatigue & AMS. Pt also c/o lower abdominal pain & urine draw was concerning for UTI. Pt is being treated for acute decompensated systolic CHF, acute hypoxic respiratory failure, & sepsis 2/2 UTI. PMH: systolic CHF, CKD stage 3A, DM2, parkinsonism, OSA, HTN  ? ?Clinical Impression ?  ?Renee Carpenter was seen for OT evaluation this date. Prior to hospital admission, pt was MOD I for stand pivot transfers to her WC/scooter. Pt lives in an ILF, with a PCA who visits 4 days/week and assistance with cleaning 1 day per week. She regularly uses her scooter to get to her facility dining hall and leisure activities. Pt reports she had been working with home health PT since her last hospital admission in February 2023 and had made good progress. She is eager to return to her PLOF with improved safety and functional independence during ADL management. Currently pt demonstrates impairments as described below (See OT problem list) which functionally limit her ability to perform ADL/self-care tasks. Pt currently requires MOD A for bed mobility, MOD A for STS attempts, and close CGA for static sitting/standing at EOB.  Pt would benefit from skilled OT services to address noted impairments and functional limitations (see below for any additional details) in order to maximize safety and independence while minimizing falls risk and caregiver burden. Upon hospital discharge, recommend STR to maximize pt safety and return to PLOF.  ?  ?   ? ?Recommendations for follow up therapy are one component of a multi-disciplinary discharge planning process, led by the attending physician.  Recommendations may be updated based on patient status, additional functional criteria and insurance  authorization.  ? ?Follow Up Recommendations ? Skilled nursing-short term rehab (<3 hours/day)  ?  ?Assistance Recommended at Discharge Intermittent Supervision/Assistance  ?Patient can return home with the following A lot of help with bathing/dressing/bathroom;A lot of help with walking and/or transfers;Assistance with cooking/housework;Assist for transportation ? ?  ?Functional Status Assessment ? Patient has had a recent decline in their functional status and demonstrates the ability to make significant improvements in function in a reasonable and predictable amount of time.  ?Equipment Recommendations ? None recommended by OT (Pt has necessary equipment.)  ?  ?Recommendations for Other Services   ? ? ?  ?Precautions / Restrictions Precautions ?Precautions: Fall ?Precaution Comments: monitor vitals ?Restrictions ?Weight Bearing Restrictions: No  ? ?  ? ?Mobility Bed Mobility ?Overal bed mobility: Needs Assistance ?Bed Mobility: Supine to Sit, Sit to Supine ?  ?  ?Supine to sit: Supervision, HOB elevated ?Sit to supine: Mod assist, HOB elevated (assistance to elevate BLE onto bed) ?  ?General bed mobility comments: use of bed rails ?  ? ?Transfers ?Overall transfer level: Needs assistance ?Equipment used: Rolling walker (2 wheels) ?Transfers: Sit to/from Stand ?Sit to Stand: Mod assist, From elevated surface ?  ?  ?  ?  ?  ?General transfer comment: Pt able to progress to close CGA for static standing at EOB. ?  ? ?  ?Balance Overall balance assessment: Needs assistance ?Sitting-balance support: Feet supported, Bilateral upper extremity supported ?Sitting balance-Leahy Scale: Fair ?Sitting balance - Comments: close supervision static sitting ?  ?Standing balance support: Reliant on assistive device for balance, During functional activity ?Standing balance-Leahy Scale: Fair ?Standing balance comment: Requires initial assist  for static standing balance 2/2 posterior lean. ?  ?  ?  ?  ?  ?  ?  ?  ?  ?  ?  ?   ? ?ADL  either performed or assessed with clinical judgement  ? ?ADL Overall ADL's : Needs assistance/impaired ?  ?  ?  ?  ?  ?  ?  ?  ?  ?  ?  ?  ?  ?  ?  ?  ?  ?  ?  ?General ADL Comments: Pt is functionally limited by generalized weakness, decreased activity tolerance, & cardiopulmonary status. MOD A for bed mobility, MOD A for STS progressing to close CGA for static standing at EOB. Pt fatigues quickly and requests return to bed. Anticipate MOD A for LB ADL management including LB dressing and bathing. SET UP assist for UB ADL managemeing including self-feeding and grooming.  ? ? ? ?Vision Patient Visual Report: No change from baseline ?   ?   ?Perception   ?  ?Praxis   ?  ? ?Pertinent Vitals/Pain Pain Assessment ?Pain Assessment: No/denies pain  ? ? ? ?Hand Dominance   ?  ?Extremity/Trunk Assessment Upper Extremity Assessment ?Upper Extremity Assessment: Generalized weakness ?  ?Lower Extremity Assessment ?Lower Extremity Assessment: Generalized weakness ?  ?Cervical / Trunk Assessment ?Cervical / Trunk Assessment: Kyphotic ?  ?Communication Communication ?Communication: HOH ?  ?Cognition Arousal/Alertness: Awake/alert ?Behavior During Therapy: [facility] for tasks assessed/performed ?Overall Cognitive Status: Within Functional Limits for tasks assessed ?  ?  ?  ?  ?  ?  ?  ?  ?  ?  ?  ?  ?  ?  ?  ?  ?General Comments: Pleasant, motivated to participate in session despite fatigue. HOH ?  ?  ?General Comments  Pt on 5L/min via nasal cannula with SPO2 >/= 88% ? ?  ?Exercises Other Exercises ?Other Exercises: Pt/caregiver educated on role of OT in acute setting, safe use of AE/DME for ADL management, energy conservation strategies and routines modifications to support safety and functional independence upon hospital DC. ?  ?Shoulder Instructions    ? ? ?Home Living Family/patient expects to be discharged to:: Other (Comment) (ILF apartment in [place] at [place]) ?  ?  ?  ?  ?  ?  ?  ?  ?  ?  ?  ?  ?  ?  ?  ?  ?Additional  Comments: Pt reports she has a caregiver 8/9am-4pm M-F & they assists with meals. ?  ? ?  ?Prior Functioning/Environment Prior Level of Function : Needs assist ?  ?  ?  ?Physical Assist : Mobility (physical);ADLs (physical) ?Mobility (physical): Transfers ?ADLs (physical): Bathing;Dressing;IADLs;Toileting ?Mobility Comments: Pt reports she was independent with bed mobility in regular bed & performed stand pivot transfers to power w/c without assistance. Had been working with PT regularly at home. ?ADLs Comments: PCA's provide assist for dressing and bathing. Pt uses WC/Scooter for functional mobility. Generally eats meals at ALF dining facilities. Participates in regular ILF activities and outings. ?  ? ?  ?  ?OT Problem List: Decreased strength;Decreased coordination;Cardiopulmonary status limiting activity;Decreased range of motion;Decreased activity tolerance;Decreased safety awareness;Impaired balance (sitting and/or standing);Decreased knowledge of use of DME or AE ?  ?   ?OT Treatment/Interventions: Self-care/ADL training;Therapeutic exercise;Therapeutic activities;DME and/or AE instruction;Patient/family education;Balance training;Energy conservation  ?  ?OT Goals(Current goals can be found in the care plan section) Acute Rehab OT Goals ?Patient Stated Goal: To go home ?OT Goal Formulation:  With patient ?Time For Goal Achievement: 10/25/21 ?Potential to Achieve Goals: Good ?ADL Goals ?Pt Will Perform Grooming: sitting;with set-up;with supervision ?Pt Will Perform Lower Body Dressing: sit to/from stand;with min assist;with caregiver independent in assisting ?Pt Will Transfer to Toilet: bedside commode;with set-up;with supervision;stand pivot transfer ?Pt Will Perform Toileting - Clothing Manipulation and hygiene: sit to/from stand;with min assist;with caregiver independent in assisting  ?OT Frequency: Min 2X/week ?  ? ?Co-evaluation   ?  ?  ?  ?  ? ?  ?AM-PAC OT "6 Clicks" Daily Activity     ?Outcome Measure  Help from another person eating meals?: A Little ?Help from another person taking care of personal grooming?: A Little ?Help from another person toileting, which includes using toliet, bedpan, or urinal?: A Lot ?H

## 2021-10-11 NOTE — Evaluation (Signed)
Physical Therapy Evaluation ?Patient Details ?Name: Renee Carpenter ?MRN: [id number] ?DOB: 11-Oct-1925 ?Today's Date: 10/11/2021 ? ?History of Present Illness ? Pt is a 88 y/o F admitted on 10/09/21 after presenting with c/o worsening SOB associated with fatigue & AMS. Pt also c/o lower abdominal pain & urine draw was concerning for UTI. Pt is being treated for acute decompensated systolic CHF, acute hypoxic respiratory failure, & sepsis 2/2 UTI. PMH: systolic CHF, CKD stage 3A, DM2, parkinsonism, OSA, HTN  ?Clinical Impression ? Pt is a very pleasant lady who is agreeable to PT evaluation. Pt is on 5L/min via nasal cannula throughout session with SPO2 >/= 88% with PT Providing occasional cuing for pursed lip breathing. Pt reports prior to admission she was residing in [place] apartment, completing bed mobility independently & stand pivot transfers to w/c with mod I, with caregiver 8/9AM-4PM M-F. On this date, pt is able to complete supine>sit with supervision with [facility] elevated & bed rails, but mod assist for sit>supine. Pt tolerates sitting EOB ~5 minutes initially c/o lightheadedness but towards end of sitting reports it worsens so assist back to bed. Cardiology in to assess pt & nurse assisting with meal set up/feeding (pt with more BUE tremors on this date compared to normal). Will continue to follow pt acutely to address endurance, balance, and transfers. ?   ? ?Recommendations for follow up therapy are one component of a multi-disciplinary discharge planning process, led by the attending physician.  Recommendations may be updated based on patient status, additional functional criteria and insurance authorization. ? ?Follow Up Recommendations Skilled nursing-short term rehab (<3 hours/day) ? ?  ?Assistance Recommended at Discharge Frequent or constant Supervision/Assistance  ?Patient can return home with the following ? A lot of help with walking and/or transfers;A lot of help with bathing/dressing/bathroom;Assistance  with feeding ? ?  ?Equipment Recommendations None recommended by PT  ?Recommendations for Other Services ?    ?  ?Functional Status Assessment Patient has had a recent decline in their functional status and demonstrates the ability to make significant improvements in function in a reasonable and predictable amount of time.  ? ?  ?Precautions / Restrictions Precautions ?Precautions: Fall ?Precaution Comments: monitor vitals ?Restrictions ?Weight Bearing Restrictions: No  ? ?  ? ?Mobility ? Bed Mobility ?Overal bed mobility: Needs Assistance ?Bed Mobility: Supine to Sit, Sit to Supine ?  ?  ?Supine to sit: Supervision, HOB elevated ?Sit to supine: Mod assist, HOB elevated (assistance to elevate BLE onto bed) ?  ?General bed mobility comments: use of bed rails ?  ? ?Transfers ?  ?  ?  ?  ?  ?  ?  ?  ?  ?  ?  ? ?Ambulation/Gait ?  ?  ?  ?  ?  ?  ?  ?  ? ?Stairs ?  ?  ?  ?  ?  ? ?Wheelchair Mobility ?  ? ?Modified Rankin (Stroke Patients Only) ?  ? ?  ? ?Balance Overall balance assessment: Needs assistance ?Sitting-balance support: Feet supported, Bilateral upper extremity supported ?Sitting balance-Leahy Scale: Fair ?Sitting balance - Comments: close supervision static sitting ?  ?  ?  ?  ?  ?  ?  ?  ?  ?  ?  ?  ?  ?  ?  ?   ? ? ? ?Pertinent Vitals/Pain Pain Assessment ?Pain Assessment: No/denies pain  ? ? ?Home Living Family/patient expects to be discharged to::  (ILF apartment at the [place]) ?  ?  ?  ?  ?  ?  ?  ?  ?  ?  Additional Comments: Pt reports she has a caregiver 8/9am-4pm M-F & they assists with meals.  ?  ?Prior Function   ?  ?  ?  ?  ?  ?  ?Mobility Comments: Pt reports she was independent with bed mobility in regular bed & performed stand pivot transfers to power w/c without assistance. ?  ?  ? ? ?Hand Dominance  ?   ? ?  ?Extremity/Trunk Assessment  ? Upper Extremity Assessment ?Upper Extremity Assessment: Generalized weakness ?  ? ?Lower Extremity Assessment ?Lower Extremity Assessment:  Generalized weakness ?  ? ?   ?Communication  ? Communication: HOH  ?Cognition Arousal/Alertness: Awake/alert ?Behavior During Therapy: [facility] for tasks assessed/performed ?Overall Cognitive Status: Within Functional Limits for tasks assessed ?  ?  ?  ?  ?  ?  ?  ?  ?  ?  ?  ?  ?  ?  ?  ?  ?General Comments: Very pleasant lady. ?  ?  ? ?  ?General Comments General comments (skin integrity, edema, etc.): Pt on 5L/min via nasal cannula with SPO2 >/= 88% ? ?  ?Exercises    ? ?Assessment/Plan  ?  ?PT Assessment Patient needs continued PT services  ?PT Problem List Decreased strength;Decreased mobility;Decreased activity tolerance;Cardiopulmonary status limiting activity;Decreased balance;Decreased knowledge of use of DME ? ?   ?  ?PT Treatment Interventions DME instruction;Therapeutic activities;Modalities;Gait training;Therapeutic exercise;Patient/family education;Functional mobility training;Neuromuscular re-education;Manual techniques;Balance training   ? ?PT Goals (Current goals can be found in the Care Plan section)  ?Acute Rehab PT Goals ?Patient Stated Goal: get better, go home ?PT Goal Formulation: With patient ?Time For Goal Achievement: 10/25/21 ?Potential to Achieve Goals: Good ? ?  ?Frequency Min 2X/week ?  ? ? ?Co-evaluation   ?  ?  ?  ?  ? ? ?  ?AM-PAC PT "6 Clicks" Mobility  ?Outcome Measure Help needed turning from your back to your side while in a flat bed without using bedrails?: A Little ?Help needed moving from lying on your back to sitting on the side of a flat bed without using bedrails?: A Lot ?Help needed moving to and from a bed to a chair (including a wheelchair)?: A Lot ?Help needed standing up from a chair using your arms (e.g., wheelchair or bedside chair)?: A Lot ?Help needed to walk in hospital room?: Total ?Help needed climbing 3-5 steps with a railing? : Total ?6 Click Score: 11 ? ?  ?End of Session Equipment Utilized During Treatment: Oxygen ?Activity Tolerance:  (limited by increasing  lightheadedness with sitting EOB) ?Patient left: in bed;with call bell/phone within reach;with bed alarm set;with nursing/sitter in room ?Nurse Communication: Mobility status ?PT Visit Diagnosis: Difficulty in walking, not elsewhere classified (R26.2);Muscle weakness (generalized) (M62.81);Other abnormalities of gait and mobility (R26.89) ?  ? ?Time: [id number] ?PT Time Calculation (min) (ACUTE ONLY): 20 min ? ? ?Charges:   PT Evaluation ?$PT Eval Moderate Complexity: 1 Mod ?  ?  ?   ? ? ?Lavone Nian, PT, DPT ?10/11/21, 10:05 AM ? ? ?Waunita Schooner ?10/11/2021, 10:02 AM ? ?

## 2021-10-11 NOTE — Consult Note (Signed)
[facility] Cardiology ? ?CARDIOLOGY CONSULT NOTE  ?Patient ID: ?[place] ?MRN: [id number] ?DOB/AGE: [birth year] 88 y.o. ? ?Admit date: 10/09/2021 ?Referring Physician Ralene Muskrat ?Primary Physician Jerrol Banana., MD ?Primary Cardiologist Nehemiah Massed ?Reason for Consultation AoCHF ? ?HPI:  ?Renee Carpenter is a 88 year old female with history of CHF (EF25-30%), chronic left bundle branch block, CKD 3, hypertension, hyperlipidemia was admitted with shortness of breath, fatigue, altered mental status.  She was discovered to have sepsis from a urinary source.  Cardiology is consulted for further assistance. ? ?Interval history: ?-Became acutely hypotensive yesterday afternoon to 73/40 which responded to midodrine and an albumin bolus. ?- This AM much more awake an alert.  ?- On 5 L O2. Does have some shortness of breath.  ? ?Review of systems complete and found to be negative unless listed above  ? ? ? ?Past Medical History:  ?Diagnosis Date  ? Anemia   ? WAS ANEMIC  ? Arthritis   ? Blood transfusion   ? HAD 1 SEVERAL YRS AGO...3-4 YR  ? CHF (congestive heart failure) ([place])   ? Chronic kidney disease   ? SOME DECREASE IN KIDNEY FUNCTION  ? Diabetes mellitus without complication ([place])   ? Hypertension   ? Normal cardiac stress test   ? REQUESTING [place]  ? Normal echocardiogram   ? REQUESTING THAT RESULT -- DR [name]   ? PONV (postoperative nausea and vomiting)   ? Shortness of breath   ? Sleep apnea   ? 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  ?Past Surgical History:  ?Procedure Laterality Date  ? ABDOMINAL HYSTERECTOMY    ? BACK SURGERY    ? 2 CERVICAL SURGERIES ALSO  ? CATARACTS    ? RIGHT EYE CATARACT  ? CERVICAL DISCECTOMY  08/1995 and 2011  ? C4-,C5-6, C6-7 anterior cervical; Dr. Ellene Route fusion and plating  ? LAMINECTOMY    ? decompressive; Dr. Burman Riis in [place] MICRODISCECTOMY  04/14/2011  ? Procedure: LUMBAR LAMINECTOMY/DECOMPRESSION MICRODISCECTOMY;  Surgeon: [name];  Location: [facility];  Service: Neurosurgery;  Laterality: N/A;  Lumbar Three and Lumbar Four Laminectomy  ? TONSILLECTOMY AND ADENOIDECTOMY  1939  ?  ?Medications Prior to Admission  ?Medication Sig Dispense Refill Last Dose  ? albuterol (VENTOLIN HFA) 108 (90 Base) MCG/ACT inhaler INHALE 2 PUFFS INTO THE LUNGS EVERY 4 HOURS AS NEEDED FOR WHEEZING OR SHORTNESS OF BREATH 6.7 g 3 10/09/2021  ? carbidopa-levodopa (SINEMET IR) 25-100 MG tablet Take 1 tablet by mouth 3 (three) times daily. Take 1 tablet three times a day   10/09/2021  ? Coenzyme Q10 (CO Q10) 200 MG CAPS Take 1 capsule by mouth daily.   10/08/2021 at 1500  ? DULoxetine (CYMBALTA) 30 MG capsule TAKE 1 CAPSULE(30 MG) BY MOUTH DAILY 30 capsule 2 10/09/2021  ? empagliflozin (JARDIANCE) 10 MG TABS tablet Take by mouth daily.   10/09/2021  ? esomeprazole (NEXIUM) 40 MG capsule TAKE 1 CAPSULE BY MOUTH EVERY DAY 90 capsule 1 10/09/2021  ? fluticasone (FLONASE) 50 MCG/ACT nasal spray Place 2 sprays into both nostrils daily. 16 g 6 10/09/2021  ? furosemide (LASIX) 40 MG tablet Take 1 tablet (40 mg total) by mouth daily. 30 tablet 2 10/09/2021  ? gabapentin (NEURONTIN) 600 MG tablet TAKE 1 TABLET(600 MG) BY MOUTH THREE TIMES DAILY 270 tablet 3 10/09/2021  ? glimepiride (AMARYL) 4 MG tablet TAKE 1 TABLET(4 MG) BY MOUTH DAILY BEFORE BREAKFAST 90 tablet 1 10/09/2021  ? hydroxypropyl  methylcellulose (ISOPTO TEARS) 2.5 % ophthalmic solution Place 1 drop into both eyes 3 (three) times daily as needed. Dry eyes    10/08/2021  ? MAGNESIUM-OXIDE 400 (241.3 Mg) MG tablet TAKE 1 TABLET BY MOUTH TWICE DAILY 60 tablet 11 10/09/2021  ? metoprolol succinate (TOPROL-XL) 25 MG 24 hr tablet Take 0.5 tablets (12.5 mg total) by mouth daily. 15 tablet 2 10/09/2021  ? MULTIPLE VITAMIN PO Take 1 tablet by mouth daily.   10/09/2021  ? pregabalin (LYRICA) 50 MG capsule Take 50 mg by mouth 2 (two) times daily.   10/09/2021  ? Probiotic Product (ALIGN PO) Take 1 capsule by mouth daily.   10/09/2021  ?  rOPINIRole (REQUIP) 0.5 MG tablet Take 0.5 mg by mouth 3 (three) times daily.   10/09/2021  ? Accu-Chek FastClix Lancets MISC Just check first thing in the morning to ensure no low blood sugar. 102 each 1   ? blood glucose meter kit and supplies KIT Just check first thing in the morning to ensure no low blood sugar. 1 each 0   ? Blood Glucose Monitoring Suppl (ACCU-CHEK GUIDE) w/Device KIT Check blood sugars daily as directed 1 kit 0   ? DULoxetine (CYMBALTA) 20 MG capsule Take 40 mg by mouth daily. (Patient not taking: Reported on 10/09/2021)   Not Taking  ? glucose blood (ACCU-CHEK GUIDE) test strip Just check first thing in the morning to ensure no low blood sugar. 100 strip 1   ? mupirocin ointment (BACTROBAN) 2 % Apply topically 2 (two) times daily.     ? tamsulosin (FLOMAX) 0.4 MG CAPS capsule TAKE 1 CAPSULE(0.4 MG) BY MOUTH DAILY (Patient not taking: Reported on 10/09/2021) 90 capsule 0 Not Taking  ? ? ?Social History  ? ?Socioeconomic History  ? Marital status: Widowed  ?  Spouse name: Not on file  ? Number of children: 1  ? Years of education: Not on file  ? Highest education level: Bachelor's degree (e.g., BA, AB, BS)  ?Occupational History  ? Occupation: retired  ?Tobacco Use  ? Smoking status: Never  ? Smokeless tobacco: Never  ?Substance and Sexual Activity  ? Alcohol use: No  ? Drug use: No  ? Sexual activity: Never  ?Other Topics Concern  ? Not on file  ?Social History Narrative  ? Not on file  ? ?Social Determinants of Health  ? ?Financial Resource Strain: Not on file  ?Food Insecurity: Not on file  ?Transportation Needs: Not on file  ?Physical Activity: Not on file  ?Stress: Not on file  ?Social Connections: Not on file  ?Intimate Partner Violence: Not on file  ?  ?Family History  ?Problem Relation Age of Onset  ? Heart failure Father   ? Heart disease Father   ? Hypertension Mother   ? Diabetes Mother   ? Heart disease Mother   ? Cancer Sister   ?     breast  ? Breast cancer Sister 36  ? Cancer Maternal  Grandfather   ? Cancer Maternal Aunt   ?     x3  ? Breast cancer Daughter 45  ?  ? ? ?Review of systems complete and found to be negative unless listed above  ? ? ? ? ?PHYSICAL EXAM ? ?General: Elderly frail appearing female, sleeping. Mouth open.  ?HEENT:  Normocephalic and atramatic. [place] oxygen in place ?Neck:  No JVD.  ?Lungs: Clear bilaterally to auscultation and percussion. ?Heart: HRRR . Normal S1 and S2 without gallops or murmurs.  ?Abdomen:  Bowel sounds are positive, abdomen soft and non-tender  ?Msk:  Back normal, normal gait. Normal strength and tone for age. ?Extremities: No clubbing, cyanosis or edema.   ?Neuro: Alert and oriented X 3. ?Psych:  Sleeping. Does not easily awaken.  ? ?Labs: ?  ?Lab Results  ?Component Value Date  ? WBC 12.6 (H) 10/11/2021  ? HGB 11.3 (L) 10/11/2021  ? HCT 32.9 (L) 10/11/2021  ? MCV 114.2 (H) 10/11/2021  ? PLT 179 10/11/2021  ?  ?Recent Labs  ?Lab 10/09/21 ?1345 10/10/21 ?0416 10/11/21 ?0404  ?NA 137   < > 139  ?K 4.1   < > 3.9  ?CL 97*   < > 97*  ?CO2 32   < > 28  ?BUN 29*   < > 48*  ?CREATININE 1.69*   < > 2.37*  ?CALCIUM 8.4*   < > 8.1*  ?PROT 6.8  --   --   ?BILITOT 0.9  --   --   ?ALKPHOS 69  --   --   ?ALT 7  --   --   ?AST 28  --   --   ?GLUCOSE 176*   < > 229*  ? < > = values in this interval not displayed.  ? ? ?No results found for: CKTOTAL, CKMB, CKMBINDEX, TROPONINI  ?Lab Results  ?Component Value Date  ? CHOL 149 08/26/2017  ? CHOL 142 02/27/2016  ? CHOL 149 01/15/2015  ? ?Lab Results  ?Component Value Date  ? HDL 47 08/26/2017  ? HDL 64 02/27/2016  ? HDL 51 01/15/2015  ? ?Lab Results  ?Component Value Date  ? [place] 74 08/26/2017  ? [place] 54 02/27/2016  ? [place] 67 01/15/2015  ? ?Lab Results  ?Component Value Date  ? TRIG 141 08/26/2017  ? TRIG 120 02/27/2016  ? TRIG 155 (H) 01/15/2015  ? ?Lab Results  ?Component Value Date  ? CHOLHDL 3.2 08/26/2017  ? ?No results found for: LDLDIRECT  ?  ?Radiology: [facility] Chest Port 1 View ? ?Result Date: 10/10/2021 ?CLINICAL  DATA:  88 year old female with history of hypoxia and shortness of breath. EXAM: PORTABLE CHEST 1 VIEW COMPARISON:  Chest x-ray 10/09/2021. FINDINGS: Lung volumes are normal. No consolidative airspace disease.

## 2021-10-11 NOTE — Progress Notes (Signed)
?PROGRESS NOTE ? ? ? ?Renee Carpenter  [id number] DOB: 1926/03/26 DOA: 10/09/2021 ?PCP: Jerrol Banana., MD  ? ? ?Brief Narrative:  ?88 y.o. female with medical history significant of systolic congestive heart failure EF 25 to 30%, CKD stage IIIa, type 2 diabetes mellitus, parkinsonism, obstructive sleep apnea, hypertension presents for evaluation of progressively worsening shortness of breath associated with fatigue and altered mentation.  Patient Renee Carpenter is compliance with all medications.  No abdominal pain ?  ?No home oxygen requirement.  On presentation brought in by EMS on 4 L nasal cannula after being found hypoxic at home.  Patient is a poor historian.  History obtained from multiple sources including the patient, patient's husband, ER physician, ER documentation. ?  ?On my evaluation patient is uncomfortable appearing.  Difficult to obtain history from.  Per husband at bedside patient is complaining of lower abdominal pain.  Urine drawn on admission is concerning for urinary tract infection although patient is afebrile with normal lactic acidosis and no leukocytosis.  Patient was febrile with Tmax 102.4.  Patient has lab work including elevated BNP and chest x-ray that demonstrates evidence of interstitial edema. ? ?Alternates between agitated and lethargic.  Mental status remains poor.  No change in oxygen requirement.  Breakthrough fevers noted ? ?Acutely hypotensive 5/6.  Midodrine, stress dose steroids, albumin administered.  Blood pressure responded.  On 5/7 much more awake.  Remains on 5 L O2. ? ? ?Assessment & Plan: ?  ?Principal Problem: ?  Acute decompensated heart failure ([place]) ? ?Acute decompensated systolic congestive heart failure ?Known recent EF from February 14 was 25 to 30% ?Patient is on home Lasix, endorses compliance ?Suspect that heart failure exacerbation occurred as a result of urinary tract infection ?No lower extremity edema, interstitial edema on CXR ?Received 60 IV Lasix  in ED ?UOP reassuring ?Kidney function subsequently worsened ?Plan: ?Hold Lasix for today ?Defer repeat echocardiogram ?Continue ins and outs and daily weights ?Continue telemetry monitoring ?Cardiology following, recommendations appreciated ?  ?Acute hypoxic respiratory failure ?Patient on room air at baseline ?Presents with a 4 L requirement ?Saturating low 90s on 4 L ?Suspect secondary to interstitial edema versus possible infectious process versus OSA ?Plan: ?Continue oxygen via nasal cannula ?Target oxygen saturation greater than 92% ?Wean oxygen as tolerated ?  ?Elevated troponin ?No chest pain ?Likely demand ischemia ?Plan: ?Continue telemetry ?  ?AKI on CKD stage IIIb ?Baseline creatinine difficult ascertain ?Appears to be approximately 1.2-1.3 ?1.69 on presentation ?Subsequently worsened to 2.3 ?Plan: ?Diuresis on hold ?Continue holding gabapentin and Lyrica ?Daily kidney function ?Avoid nonessential nephrotoxins ?If kidney function continues to worsen we will involve nephrology service ?Avoid hypotension ?  ?Urinary tract infection ?Sepsis secondary to above ?Bladder spasms ?Unable to exclude sepsis ?Urinalysis concerning for infection ?Patient with fever Tmax 102.4 ?Started on Rocephin and azithromycin, breakthrough fevers noted ?Question regarding alternative source of infection, as he had unclear ?Plan: ?Continue vancomycin ?Continue cefepime ?Continue azithromycin ?Check MRSA screen, if negative can DC Vanco ?Follow blood and urine cultures, no growth to date ?Monitor vitals and fever curve ?No IV fluids considering decompensated heart failure ?  ?Type 2 diabetes mellitus with hyperglycemia and neuropathic pain ?Hold oral agents ?Moderate sliding scale ?Carb modified diet ?PTA gabapentin on hold ?  ?Obstructive sleep apnea ?Patient on home CPAP but compliance poor due to difficulty with mask ?Nightly CPAP ?  ?Parkinsonism ?Restless leg ?PTA 3 times daily Sinemet ?PTA 3 times daily Requip ?   ?GERD. ?PPI ?  ?  Depression ?Anxiety ?PTA Cymbalta ?As needed Xanax ? ? ?DVT prophylaxis: SQ Lovenox ?Code Status: DNR ?Family Communication: Husband and caregiver at bedside 5/6 ?Disposition Plan: Status is: Inpatient ?Remains inpatient appropriate because: Sepsis, acute decompensated heart failure, acute metabolic encephalopathy.  Slowly improving.  Disposition plan pending. ? ? ?Level of care: Progressive ? ?Consultants:  ?Cardiology ?Palliative care ? ?Procedures:  ?None ? ?Antimicrobials: ?Vancomycin ?Cefepime ?Azithromycin ? ? ?Subjective: ?Seen and examined.  More awake and responsive this morning ? ?Objective: ?Vitals:  ? 10/10/21 2329 10/11/21 0330 10/11/21 0500 10/11/21 0750  ?BP: (!) 97/50 126/73  137/72  ?Pulse: 78 82    ?Resp: 18 18  18   ?Temp: 97.6 ?F (36.4 ?C) (!) 97.5 ?F (36.4 ?C)    ?TempSrc:      ?SpO2: 93% 96%    ?Weight:   70.7 kg   ?Height:      ? ? ?Intake/Output Summary (Last 24 hours) at 10/11/2021 1016 ?Last data filed at 10/11/2021 0745 ?Gross per 24 hour  ?Intake 1430 ml  ?Output 1200 ml  ?Net 230 ml  ? ?Filed Weights  ? 10/09/21 1335 10/10/21 1451 10/11/21 0500  ?Weight: 72.4 kg 74.7 kg 70.7 kg  ? ? ?Examination: ? ?General exam: Sleeping.  Easily aroused.  No visible distress.  More awake this morning ?Respiratory system: Bibasilar crackles.  Normal work of breathing.  5 L ?Cardiovascular system: S1-S2, regular rhythm, no murmurs ?Gastrointestinal system: Soft, NT/ND, normal bowel sounds ?Central nervous system: Alert and oriented x1, no focal deficits ?Extremities: Diffusely decreased power bilaterally ?Skin: No rashes, lesions or ulcers ?Psychiatry: Judgement and insight appear impaired. Mood & affect confused.  ? ? ? ?Data Reviewed: I have personally reviewed following labs and imaging studies ? ?CBC: ?Recent Labs  ?Lab 10/09/21 ?1345 10/10/21 ?0416 10/11/21 ?0404  ?WBC 6.4 13.5* 12.6*  ?NEUTROABS  --  11.3* 11.0*  ?HGB 11.7* 13.1 11.3*  ?HCT 35.0* 38.4 32.9*  ?MCV 115.5* 115.7* 114.2*   ?PLT 207 192 179  ? ?Basic Metabolic Panel: ?Recent Labs  ?Lab 10/08/21 ?1400 10/09/21 ?1345 10/10/21 ?0416 10/11/21 ?0404  ?NA 139 137 136 139  ?K 3.9 4.1 4.5 3.9  ?CL 97* 97* 94* 97*  ?CO2 33* 32 30 28  ?GLUCOSE 141* 176* 192* 229*  ?BUN 30* 29* 34* 48*  ?CREATININE 1.89* 1.69* 2.03* 2.37*  ?CALCIUM 8.5* 8.4* 8.3* 8.1*  ? ?GFR: ?Estimated Creatinine Clearance: 14.6 mL/min (A) (by C-G formula based on SCr of 2.37 mg/dL (H)). ?Liver Function Tests: ?Recent Labs  ?Lab 10/09/21 ?1345  ?AST 28  ?ALT 7  ?ALKPHOS 69  ?BILITOT 0.9  ?PROT 6.8  ?ALBUMIN 3.4*  ? ?No results for input(s): LIPASE, AMYLASE in the last 168 hours. ?No results for input(s): AMMONIA in the last 168 hours. ?Coagulation Profile: ?No results for input(s): INR, PROTIME in the last 168 hours. ?Cardiac Enzymes: ?No results for input(s): CKTOTAL, CKMB, CKMBINDEX, TROPONINI in the last 168 hours. ?BNP (last 3 results) ?No results for input(s): PROBNP in the last 8760 hours. ?HbA1C: ?No results for input(s): HGBA1C in the last 72 hours. ?CBG: ?Recent Labs  ?Lab 10/10/21 ?[id number] 10/10/21 ?1220 10/10/21 ?1721 10/10/21 ?2300 10/11/21 ?0747  ?GLUCAP 197* 204* 237* 243* 210*  ? ?Lipid Profile: ?No results for input(s): CHOL, HDL, LDLCALC, TRIG, CHOLHDL, LDLDIRECT in the last 72 hours. ?Thyroid Function Tests: ?No results for input(s): TSH, T4TOTAL, FREET4, T3FREE, THYROIDAB in the last 72 hours. ?Anemia Panel: ?No results for input(s): VITAMINB12, FOLATE, FERRITIN, TIBC, IRON, RETICCTPCT in  the last 72 hours. ?Sepsis Labs: ?Recent Labs  ?Lab 10/09/21 ?1345 10/09/21 ?1542 10/09/21 ?1700 10/10/21 ?0416 10/10/21 ?1722  ?PROCALCITON  --   --  <0.10 0.11  --   ?LATICACIDVEN 1.5 1.2  --   --  1.5  ? ? ?Recent Results (from the past 240 hour(s))  ?Resp Panel by RT-PCR (Flu A&B, Covid) Nasopharyngeal Swab     Status: None  ? Collection Time: 10/09/21  1:45 PM  ? Specimen: Nasopharyngeal Swab; Nasopharyngeal(NP) swabs in vial transport medium  ?Result Value Ref Range  Status  ? SARS Coronavirus 2 by RT PCR NEGATIVE NEGATIVE Final  ?  Comment: (NOTE) ?SARS-CoV-2 target nucleic acids are NOT DETECTED. ? ?The SARS-CoV-2 RNA is generally detectable in upper respiratory ?specimens during

## 2021-10-12 ENCOUNTER — Inpatient Hospital Stay: Payer: Medicare PPO

## 2021-10-12 ENCOUNTER — Encounter: Payer: Self-pay | Admitting: Internal Medicine

## 2021-10-12 DIAGNOSIS — Z7189 Other specified counseling: Secondary | ICD-10-CM | POA: Diagnosis not present

## 2021-10-12 DIAGNOSIS — A419 Sepsis, unspecified organism: Secondary | ICD-10-CM | POA: Diagnosis not present

## 2021-10-12 DIAGNOSIS — Z515 Encounter for palliative care: Secondary | ICD-10-CM | POA: Diagnosis not present

## 2021-10-12 DIAGNOSIS — J9601 Acute respiratory failure with hypoxia: Secondary | ICD-10-CM

## 2021-10-12 DIAGNOSIS — I509 Heart failure, unspecified: Secondary | ICD-10-CM | POA: Diagnosis not present

## 2021-10-12 DIAGNOSIS — R6520 Severe sepsis without septic shock: Secondary | ICD-10-CM

## 2021-10-12 LAB — CBC WITH DIFFERENTIAL/PLATELET
Abs Immature Granulocytes: 0.12 10*3/uL — ABNORMAL HIGH (ref 0.00–0.07)
Basophils Absolute: 0 10*3/uL (ref 0.0–0.1)
Basophils Relative: 0 %
Eosinophils Absolute: 0 10*3/uL (ref 0.0–0.5)
Eosinophils Relative: 0 %
HCT: 34.9 % — ABNORMAL LOW (ref 36.0–46.0)
Hemoglobin: 12 g/dL (ref 12.0–15.0)
Immature Granulocytes: 1 %
Lymphocytes Relative: 17 %
Lymphs Abs: 1.8 10*3/uL (ref 0.7–4.0)
MCH: 39.2 pg — ABNORMAL HIGH (ref 26.0–34.0)
MCHC: 34.4 g/dL (ref 30.0–36.0)
MCV: 114.1 fL — ABNORMAL HIGH (ref 80.0–100.0)
Monocytes Absolute: 0.4 10*3/uL (ref 0.1–1.0)
Monocytes Relative: 4 %
Neutro Abs: 8.2 10*3/uL — ABNORMAL HIGH (ref 1.7–7.7)
Neutrophils Relative %: 78 %
Platelets: 234 10*3/uL (ref 150–400)
RBC: 3.06 MIL/uL — ABNORMAL LOW (ref 3.87–5.11)
RDW: 18.9 % — ABNORMAL HIGH (ref 11.5–15.5)
WBC: 10.5 10*3/uL (ref 4.0–10.5)
nRBC: 0.4 % — ABNORMAL HIGH (ref 0.0–0.2)

## 2021-10-12 LAB — BASIC METABOLIC PANEL
Anion gap: 9 (ref 5–15)
BUN: 58 mg/dL — ABNORMAL HIGH (ref 8–23)
CO2: 28 mmol/L (ref 22–32)
Calcium: 8.3 mg/dL — ABNORMAL LOW (ref 8.9–10.3)
Chloride: 101 mmol/L (ref 98–111)
Creatinine, Ser: 2.46 mg/dL — ABNORMAL HIGH (ref 0.44–1.00)
GFR, Estimated: 18 mL/min — ABNORMAL LOW (ref >60)
Glucose, Bld: 205 mg/dL — ABNORMAL HIGH (ref 70–99)
Potassium: 4 mmol/L (ref 3.5–5.1)
Sodium: 138 mmol/L (ref 135–145)

## 2021-10-12 LAB — GLUCOSE, CAPILLARY
Glucose-Capillary: 204 mg/dL — ABNORMAL HIGH (ref 70–99)
Glucose-Capillary: 211 mg/dL — ABNORMAL HIGH (ref 70–99)
Glucose-Capillary: 181 mg/dL — ABNORMAL HIGH (ref 70–99)
Glucose-Capillary: 184 mg/dL — ABNORMAL HIGH (ref 70–99)

## 2021-10-12 MED ORDER — METHYLPREDNISOLONE SODIUM SUCC 40 MG IJ SOLR
40.0000 mg | Freq: Every day | INTRAMUSCULAR | Status: DC
  Administered 2021-10-13: 40 mg via INTRAVENOUS
  Filled 2021-10-12: qty 1

## 2021-10-12 MED ORDER — METOPROLOL SUCCINATE ER 25 MG PO TB24
12.5000 mg | ORAL_TABLET | Freq: Every day | ORAL | Status: DC
Start: 2021-10-12 — End: 2021-10-14
  Administered 2021-10-12 – 2021-10-14 (×3): 12.5 mg via ORAL
  Filled 2021-10-12 (×3): qty 1

## 2021-10-12 NOTE — Consult Note (Signed)
[facility] ? ?CARDIOLOGY CONSULT NOTE  ?Patient ID: ?[place] ?MRN: [id number] ?DOB/AGE: January 11, 1926 88 y.o. ? ?Admit date: 10/09/2021 ?Referring Physician Ralene Muskrat ?Primary Physician Jerrol Banana., MD ?Primary Cardiologist Nehemiah Massed ?Reason for Consultation AoCHF ? ?HPI:  ?Renee Carpenter is a 88 year old female with history of CHF (EF25-30%), chronic left bundle branch block, CKD 3, hypertension, hyperlipidemia was admitted with shortness of breath, fatigue, altered mental status.  She was discovered to have sepsis from a urinary source.  Cardiology is consulted for further assistance. ? ?Interval history: ?- Confused and tearful this morning.  ?- No acute events overnight. Denies SOB or chest pain.  ? ? ?Review of systems complete and found to be negative unless listed above  ? ? ? ?Past Medical History:  ?Diagnosis Date  ? Anemia   ? WAS ANEMIC  ? Arthritis   ? Blood transfusion   ? HAD 1 SEVERAL YRS AGO...3-4 YR  ? CHF (congestive heart failure) ([name])   ? Chronic kidney disease   ? SOME DECREASE IN KIDNEY FUNCTION  ? Diabetes mellitus without complication ([name])   ? Hypertension   ? Normal cardiac stress test   ? REQUESTING [name]  ? Normal echocardiogram   ? REQUESTING THAT RESULT -- DR [name]   ? PONV (postoperative nausea and vomiting)   ? Shortness of breath   ? Sleep apnea   ? 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  ?Past Surgical History:  ?Procedure Laterality Date  ? ABDOMINAL HYSTERECTOMY    ? BACK SURGERY    ? 2 CERVICAL SURGERIES ALSO  ? CATARACTS    ? RIGHT EYE CATARACT  ? CERVICAL DISCECTOMY  08/1995 and 2011  ? C4-,C5-6, C6-7 anterior cervical; Dr. Ellene Route fusion and plating  ? LAMINECTOMY    ? decompressive; Dr. Burman Riis in [place] MICRODISCECTOMY  04/14/2011  ? Procedure: LUMBAR LAMINECTOMY/DECOMPRESSION MICRODISCECTOMY;  Surgeon: [name];  Location: [facility];  Service: Neurosurgery;  Laterality: N/A;  Lumbar Three and  Lumbar Four Laminectomy  ? TONSILLECTOMY AND ADENOIDECTOMY  1939  ?  ?Medications Prior to Admission  ?Medication Sig Dispense Refill Last Dose  ? albuterol (VENTOLIN HFA) 108 (90 Base) MCG/ACT inhaler INHALE 2 PUFFS INTO THE LUNGS EVERY 4 HOURS AS NEEDED FOR WHEEZING OR SHORTNESS OF BREATH 6.7 g 3 10/09/2021  ? carbidopa-levodopa (SINEMET IR) 25-100 MG tablet Take 1 tablet by mouth 3 (three) times daily. Take 1 tablet three times a day   10/09/2021  ? Coenzyme Q10 (CO Q10) 200 MG CAPS Take 1 capsule by mouth daily.   10/08/2021 at 1500  ? DULoxetine (CYMBALTA) 30 MG capsule TAKE 1 CAPSULE(30 MG) BY MOUTH DAILY 30 capsule 2 10/09/2021  ? empagliflozin (JARDIANCE) 10 MG TABS tablet Take by mouth daily.   10/09/2021  ? esomeprazole (NEXIUM) 40 MG capsule TAKE 1 CAPSULE BY MOUTH EVERY DAY 90 capsule 1 10/09/2021  ? fluticasone (FLONASE) 50 MCG/ACT nasal spray Place 2 sprays into both nostrils daily. 16 g 6 10/09/2021  ? furosemide (LASIX) 40 MG tablet Take 1 tablet (40 mg total) by mouth daily. 30 tablet 2 10/09/2021  ? gabapentin (NEURONTIN) 600 MG tablet TAKE 1 TABLET(600 MG) BY MOUTH THREE TIMES DAILY 270 tablet 3 10/09/2021  ? glimepiride (AMARYL) 4 MG tablet TAKE 1 TABLET(4 MG) BY MOUTH DAILY BEFORE BREAKFAST 90 tablet 1 10/09/2021  ? hydroxypropyl methylcellulose (ISOPTO TEARS) 2.5 % ophthalmic solution Place 1 drop into both eyes 3 (three) times daily  as needed. Dry eyes    10/08/2021  ? MAGNESIUM-OXIDE 400 (241.3 Mg) MG tablet TAKE 1 TABLET BY MOUTH TWICE DAILY 60 tablet 11 10/09/2021  ? metoprolol succinate (TOPROL-XL) 25 MG 24 hr tablet Take 0.5 tablets (12.5 mg total) by mouth daily. 15 tablet 2 10/09/2021  ? MULTIPLE VITAMIN PO Take 1 tablet by mouth daily.   10/09/2021  ? pregabalin (LYRICA) 50 MG capsule Take 50 mg by mouth 2 (two) times daily.   10/09/2021  ? Probiotic Product (ALIGN PO) Take 1 capsule by mouth daily.   10/09/2021  ? rOPINIRole (REQUIP) 0.5 MG tablet Take 0.5 mg by mouth 3 (three) times daily.   10/09/2021  ? Accu-Chek  FastClix Lancets MISC Just check first thing in the morning to ensure no low blood sugar. 102 each 1   ? blood glucose meter kit and supplies KIT Just check first thing in the morning to ensure no low blood sugar. 1 each 0   ? Blood Glucose Monitoring Suppl (ACCU-CHEK GUIDE) w/Device KIT Check blood sugars daily as directed 1 kit 0   ? DULoxetine (CYMBALTA) 20 MG capsule Take 40 mg by mouth daily. (Patient not taking: Reported on 10/09/2021)   Not Taking  ? glucose blood (ACCU-CHEK GUIDE) test strip Just check first thing in the morning to ensure no low blood sugar. 100 strip 1   ? mupirocin ointment (BACTROBAN) 2 % Apply topically 2 (two) times daily.     ? tamsulosin (FLOMAX) 0.4 MG CAPS capsule TAKE 1 CAPSULE(0.4 MG) BY MOUTH DAILY (Patient not taking: Reported on 10/09/2021) 90 capsule 0 Not Taking  ? ? ?Social History  ? ?Socioeconomic History  ? Marital status: Widowed  ?  Spouse name: Not on file  ? Number of children: 1  ? Years of education: Not on file  ? Highest education level: Bachelor's degree (e.g., BA, AB, BS)  ?Occupational History  ? Occupation: retired  ?Tobacco Use  ? Smoking status: Never  ? Smokeless tobacco: Never  ?Substance and Sexual Activity  ? Alcohol use: No  ? Drug use: No  ? Sexual activity: Never  ?Other Topics Concern  ? Not on file  ?Social History Narrative  ? Not on file  ? ?Social Determinants of Health  ? ?Financial Resource Strain: Not on file  ?Food Insecurity: Not on file  ?Transportation Needs: Not on file  ?Physical Activity: Not on file  ?Stress: Not on file  ?Social Connections: Not on file  ?Intimate Partner Violence: Not on file  ?  ?Family History  ?Problem Relation Age of Onset  ? Heart failure Father   ? Heart disease Father   ? Hypertension Mother   ? Diabetes Mother   ? Heart disease Mother   ? Cancer Sister   ?     breast  ? Breast cancer Sister 20  ? Cancer Maternal Grandfather   ? Cancer Maternal Aunt   ?     x3  ? Breast cancer Daughter 72  ?  ? ? ?Review of  systems complete and found to be negative unless listed above  ? ? ? ? ?PHYSICAL EXAM ? ?General: Elderly frail appearing female. Tearful.  ?HEENT:  Normocephalic and atramatic. [place] oxygen in place ?Neck:  No JVD.  ?Lungs: Clear bilaterally to auscultation and percussion. ?Heart: HRRR . Normal S1 and S2 without gallops or murmurs.  ?Abdomen: Bowel sounds are positive, abdomen soft and non-tender  ?Msk:  Back normal, normal gait. Normal strength and tone  for age. ?Extremities: No clubbing, cyanosis or edema.   ?Neuro: Moves all extremities ?Psych:  Confused, asking why she is here. Crying about losing her family.  ? ?Labs: ?  ?Lab Results  ?Component Value Date  ? WBC 10.5 10/12/2021  ? HGB 12.0 10/12/2021  ? HCT 34.9 (L) 10/12/2021  ? MCV 114.1 (H) 10/12/2021  ? PLT 234 10/12/2021  ?  ?Recent Labs  ?Lab 10/09/21 ?1345 10/10/21 ?0416 10/12/21 ?0326  ?NA 137   < > 138  ?K 4.1   < > 4.0  ?CL 97*   < > 101  ?CO2 32   < > 28  ?BUN 29*   < > 58*  ?CREATININE 1.69*   < > 2.46*  ?CALCIUM 8.4*   < > 8.3*  ?PROT 6.8  --   --   ?BILITOT 0.9  --   --   ?ALKPHOS 69  --   --   ?ALT 7  --   --   ?AST 28  --   --   ?GLUCOSE 176*   < > 205*  ? < > = values in this interval not displayed.  ? ? ?No results found for: CKTOTAL, CKMB, CKMBINDEX, TROPONINI  ?Lab Results  ?Component Value Date  ? CHOL 149 08/26/2017  ? CHOL 142 02/27/2016  ? CHOL 149 01/15/2015  ? ?Lab Results  ?Component Value Date  ? HDL 47 08/26/2017  ? HDL 64 02/27/2016  ? HDL 51 01/15/2015  ? ?Lab Results  ?Component Value Date  ? [place] 74 08/26/2017  ? [place] 54 02/27/2016  ? [place] 67 01/15/2015  ? ?Lab Results  ?Component Value Date  ? TRIG 141 08/26/2017  ? TRIG 120 02/27/2016  ? TRIG 155 (H) 01/15/2015  ? ?Lab Results  ?Component Value Date  ? CHOLHDL 3.2 08/26/2017  ? ?No results found for: LDLDIRECT  ?  ?Radiology: [facility] Chest Port 1 View ? ?Result Date: 10/10/2021 ?CLINICAL DATA:  88 year old female with history of hypoxia and shortness of breath. EXAM: PORTABLE  CHEST 1 VIEW COMPARISON:  Chest x-ray 10/09/2021. FINDINGS: Lung volumes are normal. No consolidative airspace disease. No pleural effusions. No pneumothorax. No pulmonary nodule or mass noted. Pulmonary vascul

## 2021-10-12 NOTE — Progress Notes (Signed)
?PROGRESS NOTE ? ? ? ?Renee Carpenter  [id number] DOB: 04-10-1926 DOA: 10/09/2021 ?PCP: Jerrol Banana., MD  ? ? ?Brief Narrative:  ?88 y.o. female with medical history significant of systolic congestive heart failure EF 25 to 30%, CKD stage IIIa, type 2 diabetes mellitus, parkinsonism, obstructive sleep apnea, hypertension presents for evaluation of progressively worsening shortness of breath associated with fatigue and altered mentation.  [name] is compliance with all medications.  No abdominal pain ?  ?No home oxygen requirement.  On presentation brought in by EMS on 4 L nasal cannula after being found hypoxic at home.  Patient is a poor historian.  History obtained from multiple sources including the patient, patient's husband, ER physician, ER documentation. ?  ?On my evaluation patient is uncomfortable appearing.  Difficult to obtain history from.  Per husband at bedside patient is complaining of lower abdominal pain.  Urine drawn on admission is concerning for urinary tract infection although patient is afebrile with normal lactic acidosis and no leukocytosis.  Patient was febrile with Tmax 102.4.  Patient has lab work including elevated BNP and chest x-ray that demonstrates evidence of interstitial edema. ? ?Alternates between agitated and lethargic.  Mental status remains poor.  No change in oxygen requirement.  Breakthrough fevers noted ? ?Acutely hypotensive 5/6.  Midodrine, stress dose steroids, albumin administered.  Blood pressure responded.  On 5/7 much more awake.  Remains on 4 L O2. ? ? ?Assessment & Plan: ?  ?Principal Problem: ?  Acute decompensated heart failure ([place]) ? ?Acute decompensated systolic congestive heart failure ?Known recent EF from February 14 was 25 to 30% ?Patient is on home Lasix, endorses compliance ?Suspect that heart failure exacerbation occurred as a result of urinary tract infection ?No lower extremity edema, interstitial edema on CXR ?Received 60 IV Lasix  in ED ?UOP reassuring ?Kidney function subsequently worsened ?Plan: ?Hold Lasix for now ?Defer repeat echocardiogram ?Continue ins and outs and daily weights ?Continue telemetry monitoring ?Cardiology following, recommendations appreciated ?  ?Acute hypoxic respiratory failure ?Patient on room air at baseline ?Presents with a 4 L requirement ?Saturating low 90s on 4 L ?Suspect secondary to interstitial edema versus possible infectious process versus OSA ?PE also on differential, though lower ?Plan: ?Continue oxygen via nasal cannula ?Target oxygen saturation greater than 92% ?Wean oxygen as tolerated ?  ?Elevated troponin ?No chest pain ?Likely demand ischemia ?Plan: ?Continue telemetry ?  ?AKI on CKD stage IIIb ?Baseline creatinine difficult ascertain ?Appears to be approximately 1.2-1.3 ?1.69 on presentation ?Subsequently worsened to 2.46 ?Plan: ?Diuresis on hold ?Continue holding gabapentin and Lyrica ?Daily kidney function ?Avoid nonessential nephrotoxins ?Nephrology engaged 5/8 ?Avoid hypotension ?  ?Urinary tract infection ?Sepsis secondary to above ?Bladder spasms ?Unable to exclude sepsis ?Urinalysis concerning for infection ?Patient with fever Tmax 102.4 ?Started on Rocephin and azithromycin, breakthrough fevers noted ?Question regarding alternative source of infection, unclear ?MRSA negative ?Vancomycin stopped ?Plan: ?Continue cefepime, 5-day stop date ?Continue azithromycin, 3 days ?Follow blood cultures, no growth to date ?Unfortunately urine culture not sent ?No IV fluids ?Monitor vitals and fever curve ?  ?Type 2 diabetes mellitus with hyperglycemia and neuropathic pain ?Hold oral agents ?Moderate sliding scale ?Carb modified diet ?PTA gabapentin on hold ?  ?Obstructive sleep apnea ?Patient on home CPAP but compliance poor due to difficulty with mask ?Nightly CPAP ?  ?Parkinsonism ?Restless leg ?PTA 3 times daily Sinemet ?PTA 3 times daily Requip ?  ?GERD. ?PPI ?  ?Depression ?Anxiety ?PTA  Cymbalta ?As needed  Xanax ? ? ?DVT prophylaxis: SQ Lovenox ?Code Status: DNR ?Family Communication: Caregiver at bedside 5/6 ?Disposition Plan: Status is: Inpatient ?Remains inpatient appropriate because: Sepsis, acute decompensated heart failure, acute metabolic encephalopathy.  Slowly improving.  Disposition plan pending. ? ? ?Level of care: Progressive ? ?Consultants:  ?Cardiology ?Palliative care ? ?Procedures:  ?None ? ?Antimicrobials: ?Vancomycin ?Cefepime ?Azithromycin ? ? ?Subjective: ?Seen and examined.  More awake and responsive this morning ? ?Objective: ?Vitals:  ? 10/11/21 1449 10/11/21 1950 10/12/21 0402 10/12/21 0847  ?BP: 134/71 133/75 133/79 (!) 155/82  ?Pulse: 79 75 84 91  ?Resp: 18 (!) 22  20  ?Temp: 98.3 ?F (36.8 ?C) (!) 97.5 ?F (36.4 ?C) 97.6 ?F (36.4 ?C) 97.6 ?F (36.4 ?C)  ?TempSrc:    Oral  ?SpO2: 98% 97% 93% 95%  ?Weight:   68.4 kg   ?Height:      ? ? ?Intake/Output Summary (Last 24 hours) at 10/12/2021 1040 ?Last data filed at 10/11/2021 1911 ?Gross per 24 hour  ?Intake 240 ml  ?Output 650 ml  ?Net -410 ml  ? ?Filed Weights  ? 10/10/21 1451 10/11/21 0500 10/12/21 0402  ?Weight: 74.7 kg 70.7 kg 68.4 kg  ? ? ?Examination: ? ?General exam: Awake alert.  No visible distress ?Respiratory system: Bibasilar crackles.  Normal work of breathing.  4 L ?Cardiovascular system: S1-S2, regular rhythm, no murmurs ?Gastrointestinal system: Soft, NT/ND, normal bowel sounds ?Central nervous system: Alert and oriented x1, no focal deficits ?Extremities: Diffusely decreased power bilaterally ?Skin: No rashes, lesions or ulcers ?Psychiatry: Judgement and insight appear impaired. Mood & affect confused.  ? ? ? ?Data Reviewed: I have personally reviewed following labs and imaging studies ? ?CBC: ?Recent Labs  ?Lab 10/09/21 ?1345 10/10/21 ?0416 10/11/21 ?0404 10/12/21 ?0326  ?WBC 6.4 13.5* 12.6* 10.5  ?NEUTROABS  --  11.3* 11.0* 8.2*  ?HGB 11.7* 13.1 11.3* 12.0  ?HCT 35.0* 38.4 32.9* 34.9*  ?MCV 115.5* 115.7* 114.2*  114.1*  ?PLT 207 192 179 234  ? ?Basic Metabolic Panel: ?Recent Labs  ?Lab 10/08/21 ?1400 10/09/21 ?1345 10/10/21 ?0416 10/11/21 ?0404 10/12/21 ?0326  ?NA 139 137 136 139 138  ?K 3.9 4.1 4.5 3.9 4.0  ?CL 97* 97* 94* 97* 101  ?CO2 33* 32 30 28 28   ?GLUCOSE 141* 176* 192* 229* 205*  ?BUN 30* 29* 34* 48* 58*  ?CREATININE 1.89* 1.69* 2.03* 2.37* 2.46*  ?CALCIUM 8.5* 8.4* 8.3* 8.1* 8.3*  ? ?GFR: ?Estimated Creatinine Clearance: 13.9 mL/min (A) (by C-G formula based on SCr of 2.46 mg/dL (H)). ?Liver Function Tests: ?Recent Labs  ?Lab 10/09/21 ?1345  ?AST 28  ?ALT 7  ?ALKPHOS 69  ?BILITOT 0.9  ?PROT 6.8  ?ALBUMIN 3.4*  ? ?No results for input(s): LIPASE, AMYLASE in the last 168 hours. ?No results for input(s): AMMONIA in the last 168 hours. ?Coagulation Profile: ?No results for input(s): INR, PROTIME in the last 168 hours. ?Cardiac Enzymes: ?No results for input(s): CKTOTAL, CKMB, CKMBINDEX, TROPONINI in the last 168 hours. ?BNP (last 3 results) ?No results for input(s): PROBNP in the last 8760 hours. ?HbA1C: ?No results for input(s): HGBA1C in the last 72 hours. ?CBG: ?Recent Labs  ?Lab 10/11/21 ?[id number] 10/11/21 ?1126 10/11/21 ?1712 10/11/21 ?1952 10/12/21 ?0849  ?GLUCAP 210* 267* 212* 200* 204*  ? ?Lipid Profile: ?No results for input(s): CHOL, HDL, LDLCALC, TRIG, CHOLHDL, LDLDIRECT in the last 72 hours. ?Thyroid Function Tests: ?No results for input(s): TSH, T4TOTAL, FREET4, T3FREE, THYROIDAB in the last 72 hours. ?Anemia Panel: ?No  results for input(s): VITAMINB12, FOLATE, FERRITIN, TIBC, IRON, RETICCTPCT in the last 72 hours. ?Sepsis Labs: ?Recent Labs  ?Lab 10/09/21 ?1345 10/09/21 ?1542 10/09/21 ?1700 10/10/21 ?0416 10/10/21 ?1722  ?PROCALCITON  --   --  <0.10 0.11  --   ?LATICACIDVEN 1.5 1.2  --   --  1.5  ? ? ?Recent Results (from the past 240 hour(s))  ?Resp Panel by RT-PCR (Flu A&B, Covid) Nasopharyngeal Swab     Status: None  ? Collection Time: 10/09/21  1:45 PM  ? Specimen: Nasopharyngeal Swab; Nasopharyngeal(NP)  swabs in vial transport medium  ?Result Value Ref Range Status  ? SARS Coronavirus 2 by RT PCR NEGATIVE NEGATIVE Final  ?  Comment: (NOTE) ?SARS-CoV-2 target nucleic acids are NOT DETECTED. ? ?The SARS-CoV-2 RNA i

## 2021-10-12 NOTE — TOC Progression Note (Signed)
Transition of Care (TOC) - Progression Note  ? ? ?Patient Details  ?Name: Renee Carpenter ?MRN: [id number] ?Date of Birth: October 02, 1925 ? ?Transition of Care (TOC) CM/SW Contact  ?Truddie Hidden, RN ?Phone Number: ?10/12/2021, 2:20 PM ? ?Clinical Narrative:    ?Patient admitted from [facility]. Per the admissions coordinator. Patient could be accepted to the SNU .Per PT/OT recommendation patient would benefit from SNF. Attempted to contact patient's HCPOA, Melford Aase. Left a message requesting return call to this CM.  ? ? ?  ?  ? ?Expected Discharge Plan and Services ?  ?  ?  ?  ?  ?                ?  ?  ?  ?  ?  ?  ?  ?  ?  ?  ? ? ?Social Determinants of Health (SDOH) Interventions ?  ? ?Readmission Risk Interventions ?   ? View : No data to display.  ?  ?  ?  ? ? ?

## 2021-10-12 NOTE — Consult Note (Signed)
Renee Carpenter MRN: [id number] DOB/AGE: [birth year] 88 y.o. Primary Care Physician:Gilbert, Leonette Monarch., MD Admit date: 10/09/2021 Chief Complaint:  Chief Complaint  Patient presents with   Shortness of Breath   Fever   HPI: Patient is a 88 year old Caucasian female with a past medical history of chronic systolic CHF with ejection fraction of around 25 to 30%, CKD stage IIIb, diabetes mellitus type 2, Parkinson's, obstructive sleep apnea, hypertension who was brought to the ER with chief complaint of shortness of breath.  Upon evaluation in the ER patient was found to be hypoxic and febrile with temperature of 102.4 with a possibility of UTI, patient was admitted for further care  Patient had acute kidney injury and nephrology was consulted.  Patient was seen today on second floor Patient offers no specific physical complaint No complaint of chest pain No complaint of cough No complaint of hematuria  Patient is pleasantly confused, patient's data has been reviewed from the team as well as medical records  Past Medical History:  Diagnosis Date   Anemia    WAS ANEMIC   Arthritis    Blood transfusion    HAD 1 SEVERAL YRS AGO...3-4 YR   CHF (congestive heart failure) (HCC)    Chronic kidney disease    SOME DECREASE IN KIDNEY FUNCTION   Diabetes mellitus without complication (HCC)    Hypertension    Normal cardiac stress test    REQUESTING COPY FROM [facility]   Normal echocardiogram    REQUESTING THAT RESULT -- DR Park Breed    PONV (postoperative nausea and vomiting)    Shortness of breath    Sleep apnea    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 She has never used smokeless tobacco. She reports that she does not drink alcohol and does not use drugs.   Allergies:  Allergies  Allergen Reactions   Acetaminophen Itching and Other (See Comments)    Stomach upset   Aspirin Itching and Other (See Comments)    Stomach pain   Metformin And Related     nausea   Milk-Related Compounds Diarrhea   Other Other (See Comments)    Novocaine, strange feeling   Sulfa Antibiotics Other (See Comments)   Sulfasalazine     Other reaction(s): Other (See Comments)   Tizanidine     Diarrhea, almost passed out, felt dizzy and confused   Caffeine Palpitations   Ginger Palpitations   Ibuprofen Rash    Medications Prior to Admission  Medication Sig Dispense Refill   albuterol (VENTOLIN HFA) 108 (90 Base) MCG/ACT inhaler INHALE 2 PUFFS INTO THE LUNGS EVERY 4 HOURS AS NEEDED FOR WHEEZING OR SHORTNESS OF BREATH 6.7 g 3   carbidopa-levodopa (SINEMET IR) 25-100 MG tablet Take 1 tablet by mouth 3 (three) times daily. Take 1 tablet three times a day     Coenzyme Q10 (CO Q10) 200 MG CAPS Take 1 capsule by mouth daily.     DULoxetine (  CYMBALTA) 30 MG capsule TAKE 1 CAPSULE(30 MG) BY MOUTH DAILY 30 capsule 2   empagliflozin (JARDIANCE) 10 MG TABS tablet Take by mouth daily.     esomeprazole (NEXIUM) 40 MG capsule TAKE 1 CAPSULE BY MOUTH EVERY DAY 90 capsule 1   fluticasone (FLONASE) 50 MCG/ACT nasal spray Place 2 sprays into both nostrils daily. 16 g 6   furosemide (LASIX) 40 MG tablet Take 1 tablet (40 mg total) by mouth daily. 30 tablet 2   gabapentin (NEURONTIN) 600 MG tablet TAKE 1 TABLET(600 MG) BY MOUTH THREE TIMES DAILY 270 tablet 3   glimepiride (AMARYL) 4 MG tablet TAKE 1 TABLET(4 MG) BY MOUTH DAILY BEFORE BREAKFAST 90 tablet 1   hydroxypropyl methylcellulose (ISOPTO TEARS) 2.5 % ophthalmic solution Place 1 drop into both eyes 3 (three) times daily as needed. Dry eyes      MAGNESIUM-OXIDE 400 (241.3 Mg) MG  tablet TAKE 1 TABLET BY MOUTH TWICE DAILY 60 tablet 11   metoprolol succinate (TOPROL-XL) 25 MG 24 hr tablet Take 0.5 tablets (12.5 mg total) by mouth daily. 15 tablet 2   MULTIPLE VITAMIN PO Take 1 tablet by mouth daily.     pregabalin (LYRICA) 50 MG capsule Take 50 mg by mouth 2 (two) times daily.     Probiotic Product (ALIGN PO) Take 1 capsule by mouth daily.     rOPINIRole (REQUIP) 0.5 MG tablet Take 0.5 mg by mouth 3 (three) times daily.     Accu-Chek FastClix Lancets MISC Just check first thing in the morning to ensure no low blood sugar. 102 each 1   blood glucose meter kit and supplies KIT Just check first thing in the morning to ensure no low blood sugar. 1 each 0   Blood Glucose Monitoring Suppl (ACCU-CHEK GUIDE) w/Device KIT Check blood sugars daily as directed 1 kit 0   DULoxetine (CYMBALTA) 20 MG capsule Take 40 mg by mouth daily. (Patient not taking: Reported on 10/09/2021)     glucose blood (ACCU-CHEK GUIDE) test strip Just check first thing in the morning to ensure no low blood sugar. 100 strip 1   mupirocin ointment (BACTROBAN) 2 % Apply topically 2 (two) times daily.     tamsulosin (FLOMAX) 0.4 MG CAPS capsule TAKE 1 CAPSULE(0.4 MG) BY MOUTH DAILY (Patient not taking: Reported on 10/09/2021) 90 capsule 0       ZOX:WRUEA from the symptoms mentioned above,there are no other symptoms referable to all systems reviewed.   carbidopa-levodopa  1 tablet Oral TID   Chlorhexidine Gluconate Cloth  6 each Topical Daily   DULoxetine  30 mg Oral Daily   enoxaparin (LOVENOX) injection  30 mg Subcutaneous Q24H   insulin aspart  0-15 Units Subcutaneous TID WC   insulin aspart  0-5 Units Subcutaneous QHS   [START ON 10/13/2021] methylPREDNISolone (SOLU-MEDROL) injection  40 mg Intravenous Daily   metoprolol succinate  12.5 mg Oral Daily   pantoprazole  40 mg Oral Daily   rOPINIRole  0.5 mg Oral TID   sodium chloride flush  3 mL Intravenous Q12H        Physical Exam: Vital signs in  last 24 hours: Temp:  [96.8 F (36 C)-97.6 F (36.4 C)] 96.8 F (36 C) (05/08 1625) Pulse Rate:  [75-91] 83 (05/08 1625) Resp:  [20-22] 20 (05/08 1625) BP: (117-155)/(75-83) 117/79 (05/08 1625) SpO2:  [93 %-97 %] 95 % (05/08 1625) Weight:  [68.4 kg] 68.4 kg (05/08 0402) Weight change: -6.308 kg Last BM Date : 10/09/21  Intake/Output from previous day: 05/07 0701 - 05/08 0700 In: 240 [P.O.:240] Out: 750 [Urine:750] Total I/O In: 240 [P.O.:240] Out: 450 [Urine:450]   Physical Exam: General- pt is awake,alert, Follows commands Resp- No acute REsp distress,  Rhonchi+, Decreased breath sounds at bases CVS- S1S2 regular in rate and rhythm GIT- BS+, soft, NT, ND EXT- NO LE Edema, Cyanosis CNS- CN 2-12 grossly intact. Moving all 4 extremities Psych- normal mood and affect    Lab Results: CBC Recent Labs    10/11/21 0404 10/12/21 0326  WBC 12.6* 10.5  HGB 11.3* 12.0  HCT 32.9* 34.9*  PLT 179 234    BMET Recent Labs    10/11/21 0404 10/12/21 0326  NA 139 138  K 3.9 4.0  CL 97* 101  CO2 28 28  GLUCOSE 229* 205*  BUN 48* 58*  CREATININE 2.37* 2.46*  CALCIUM 8.1* 8.3*    MICRO Recent Results (from the past 240 hour(s))  Resp Panel by RT-PCR (Flu A&B, Covid) Nasopharyngeal Swab     Status: None   Collection Time: 10/09/21  1:45 PM   Specimen: Nasopharyngeal Swab; Nasopharyngeal(NP) swabs in vial transport medium  Result Value Ref Range Status   SARS Coronavirus 2 by RT PCR NEGATIVE NEGATIVE Final    Comment: (NOTE) SARS-CoV-2 target nucleic acids are NOT DETECTED.  The SARS-CoV-2 RNA is generally detectable in upper respiratory specimens during the acute phase of infection. The lowest concentration of SARS-CoV-2 viral copies this assay can detect is 138 copies/mL. A negative result does not preclude SARS-Cov-2 infection and should not be used as the sole basis for treatment or other patient management decisions. A negative result may occur with  improper  specimen collection/handling, submission of specimen other than nasopharyngeal swab, presence of viral mutation(s) within the areas targeted by this assay, and inadequate number of viral copies(<138 copies/mL). A negative result must be combined with clinical observations, patient history, and epidemiological information. The expected result is Negative.  Fact Sheet for Patients:  BloggerCourse.com  Fact Sheet for Healthcare Providers:  SeriousBroker.it  This test is no t yet approved or cleared by the [place] FDA and  has been authorized for detection and/or diagnosis of SARS-CoV-2 by FDA under an Emergency Use Authorization (EUA). This EUA will remain  in effect (meaning this test can be used) for the duration of the COVID-19 declaration under Section 564(b)(1) of the Act, 21 U.S.C.section 360bbb-3(b)(1), unless the authorization is terminated  or revoked sooner.       Influenza A by PCR NEGATIVE NEGATIVE Final   Influenza B by PCR NEGATIVE NEGATIVE Final    Comment: (NOTE) The Xpert Xpress SARS-CoV-2/FLU/RSV plus assay is intended as an aid in the diagnosis of influenza from Nasopharyngeal swab specimens and should not be used as a sole basis for treatment. Nasal washings and aspirates are unacceptable for Xpert Xpress SARS-CoV-2/FLU/RSV testing.  Fact Sheet for Patients: BloggerCourse.com  Fact Sheet for Healthcare Providers: SeriousBroker.it  This test is not yet approved or cleared by the [place] FDA and has been authorized for detection and/or diagnosis of SARS-CoV-2 by FDA under an Emergency Use Authorization (EUA). This EUA will remain in effect (meaning this test can be used) for the duration of the COVID-19 declaration under Section 564(b)(1) of the Act, 21 U.S.C. section 360bbb-3(b)(1), unless the authorization is terminated or revoked.  Performed at  [facility], [address] [place], [place] [address]   Blood Culture (routine x 2)     Status:  None (Preliminary result)   Collection Time: 10/09/21  1:45 PM   Specimen: BLOOD RIGHT HAND  Result Value Ref Range Status   Specimen Description BLOOD RIGHT HAND  Final   Special Requests   Final    BOTTLES DRAWN AEROBIC AND ANAEROBIC Blood Culture adequate volume   Culture   Final    NO GROWTH 3 DAYS Performed at [facility], [address], [place], [place] [address]    Report Status PENDING  Incomplete  Blood Culture (routine x 2)     Status: None (Preliminary result)   Collection Time: 10/09/21  1:45 PM   Specimen: BLOOD  Result Value Ref Range Status   Specimen Description BLOOD LEFT ANTECUBITAL  Final   Special Requests   Final    BOTTLES DRAWN AEROBIC AND ANAEROBIC Blood Culture adequate volume   Culture   Final    NO GROWTH 3 DAYS Performed at [facility], [address], [place], [place] [address]    Report Status PENDING  Incomplete  MRSA Next Gen by PCR, Nasal     Status: None   Collection Time: 10/10/21  9:21 AM   Specimen: Nasal Mucosa; Nasal Swab  Result Value Ref Range Status   MRSA by PCR Next Gen NOT DETECTED NOT DETECTED Final    Comment: (NOTE) The GeneXpert MRSA Assay (FDA approved for NASAL specimens only), is one component of a comprehensive MRSA colonization surveillance program. It is not intended to diagnose MRSA infection nor to guide or monitor treatment for MRSA infections. Test performance is not FDA approved in patients less than 105 years old. Performed at [facility], [address] [place], [place] [address]       Lab Results  Component Value Date   CALCIUM 8.3 (L) 10/12/2021   PHOS 3.4 07/21/2021   Chest x-ray done today showed IMPRESSION: Small bilateral pleural effusions and bibasilar airspace disease which could be atelectasis or infection.  Impression:   1)Renal   Acute kidney  injury AKI secondary to ATN AKI secondary to multiple factors AKI secondary to UTI/sepsis AKI secondary to hypotension as patient has SBP of less than 80 recently AKI secondary to medications as patient was recently started on Jardiance AKI secondary to cardiorenal syndrome  Patient has AKI on CKD Patient has CKD stage IIIb Patient has CKD age associated decline As patient is 88 years old Patient has CKD since 2016 Patient has CKD secondary to diabetes mellitus    Creatinine trend 2023 1.4--2.6-AKI 2022 1.4--1.6 2021  1.2--1.3 2020  1.6 2019   1.3 2018   0.9--1.1 2017  1.2--1.4 2016  1.5    2)Hypotension Patient blood pressure was low earlier Patient blood pressure is now better   3)Anemia -none MCV high  HGb at goal (9--11)   4)CKD Mineral-Bone Disorder PTH not avail  Secondary Hyperparathyroidism w/u pending  Phosphorus at goal.   5)Chronic systolic CHF  Patient has no edema Patient chest x-ray done today shows small bilateral pleural effusion Does not show much fluid overload We will hold off on giving any diuretics for now  6)Electrolytes  Normokalemic NOrmonatremic   7)Acid base Co2 at goal  8)Sepsis from urinary source Patient is on broad-spectrum antibiotics Primary team is following  9)Elevated troponins Demand ischemia Cardiology is following   Plan:  Agree with the current treatment plan Agree with current IV antibiotics I agree with holding Jardiance Will not give any diuretics We will ask for intact PTH and phosphorus We  will ask for renal ultrasound     Renee Carpenter 10/12/2021, 5:25 PM

## 2021-10-12 NOTE — Progress Notes (Signed)
Inpatient Diabetes Program Recommendations ? ?AACE/ADA: New Consensus Statement on Inpatient Glycemic Control  ?Target Ranges:  Prepandial:   less than 140 mg/dL ?     Peak postprandial:   less than 180 mg/dL (1-2 hours) ?     Critically ill patients:  140 - 180 mg/dL  ? ? Latest Reference Range & Units 10/12/21 03:26  ?Glucose 70 - 99 mg/dL 161 (H)  ? ? Latest Reference Range & Units 10/11/21 07:47 10/11/21 11:26 10/11/21 17:12 10/11/21 19:52  ?Glucose-Capillary 70 - 99 mg/dL 096 (H) 045 (H) 409 (H) 200 (H)  ? ?Review of Glycemic Control ? ?Diabetes history: DM2 ?Outpatient Diabetes medications: Jardiane 10 mg daily, Amaryl 4 mg QAM ?Current orders for Inpatient glycemic control: Novolog 0-15 units TID with meals, Novolog 0-5 units QHS; Solumedrol 20 mg Q8H ? ?Inpatient Diabetes Program Recommendations:   ? ?Insulin: If steroids are continued as ordered, please consider ordering Levemir 7 units Q24H. ? ?Thanks, ?Orlando Penner, RN, MSN, CDE ?Diabetes Coordinator ?Inpatient Diabetes Program ?[phone] (Team Pager from 8am to 5pm) ? ? ? ?

## 2021-10-12 NOTE — Consult Note (Signed)
? ?                                                                                ?Consultation Note ?Date: 10/12/2021  ? ?Patient Name: Renee Carpenter  ?DOB: [birth year]  MRN: [id number]  Age / Sex: 88 y.o., female  ?PCP: Jerrol Banana., MD ?Referring Physician: Sidney Ace, MD ? ?Reason for Consultation: Establishing goals of care ? ?HPI/Patient Profile: 88 y.o. female  with past medical history of CHF with a EF of 25 to 30%, CKD 3, DM 2, parkinsonian is him, OSA, HTN, arthritis, admitted on 10/09/2021 with acute decompensated systolic heart failure/acute hypoxic respiratory failure, UTI with AKI.   ? ?Clinical Assessment and Goals of Care: ?I have reviewed medical records including EPIC notes, labs and imaging, received report from RN, assessed the patient.  Renee Carpenter is resting quietly in bed.  She appears acutely/chronically ill and very frail.  She opens her eyes when I speak to her.  She is oriented to person and place, but not month.  I believe that she can make her basic needs known.  There is no family present at bedside at this time.  Somewhat limited interview today due to confusion.  I do ask about healthcare surrogate and she names her family friend, Carles Collet. ? ?Call to family friend, Carles Collet to discuss diagnosis prognosis, [place], EOL wishes, disposition and options.  I introduced Palliative Medicine as specialized medical care for people living with serious illness. It focuses on providing relief from the symptoms and stress of a serious illness. The goal is to improve quality of life for both the patient and the family. ? ?We discussed a brief life review of the patient.  Renee Carpenter is a widow.  She had 3 children but they are all deceased.  Her longtime family friend/40+ years, Carles Collet is her healthcare and [place].  She lives at [facility] in [place].  She has a caretaker in the daytime but has been okay being alone in the night.  We talk  about PT eval's qualified for STR, but Abe People states that Renee Carpenter would prefer to stay at [facility] with home health or "step up" if needed.           ? ?We then focused on their current illness.  We talk about the progressive nature of heart failure.  We talked about acute kidney injury and urinary tract infection.  The natural disease trajectory and expectations at EOL were discussed. ? ?Advanced directives, concepts specific to code status, artifical feeding and hydration, and rehospitalization were considered and discussed. ? ?Palliative Care services outpatient were explained and offered.  We talk about the benefits of outpatient palliative services for further goals of care discussions, talking about the "what if's and maybes".  I share that I will discuss with Renee Carpenter when she is more oriented. ? ?Discussed the importance of continued conversation with family and the medical providers regarding overall plan of care and treatment options, ensuring decisions are within the context of the patient?s values and GOCs.  Questions and concerns were addressed.   The family was encouraged to  call with questions or concerns.  PMT will continue to support holistically. ? ?Conference with attending, bedside nursing staff, transition of care team related to patient condition, needs, goals of care, disposition. ? ? ?HCPOA ?HCPOA -longtime 40+ year friend, Carles Collet, his healthcare and [place]. ?  ? ?SUMMARY OF RECOMMENDATIONS   ?At this point continue to treat the treatable but no CPR or intubation ?Time for outcomes ?Return to [place] with home health ? ? ?Code Status/Advance Care Planning: ?DNR ? ?Symptom Management:  ?Per hospitalist, no additional needs at this time. ? ?Palliative Prophylaxis:  ?Oral Care and Turn Reposition ? ?Additional Recommendations (Limitations, Scope, Preferences): ?Continue to treat the treatable but no CPR or intubation ? ?Psycho-social/Spiritual:  ?Desire for  further Chaplaincy support:no ?Additional Recommendations: Caregiving  Support/Resources and Education on Hospice ? ?Prognosis:  ?Unable to determine, based on outcomes.  6 months or less would not be surprising based on chronic illness burden, decreasing functional status. ? ?Discharge Planning:  To be determined, based on outcomes and qualifications.   ? ?  ? ?Primary Diagnoses: ?Present on Admission: ?**None** ? ? ?I have reviewed the medical record, interviewed the patient and family, and examined the patient. The following aspects are pertinent. ? ?Past Medical History:  ?Diagnosis Date  ? Anemia   ? WAS ANEMIC  ? Arthritis   ? Blood transfusion   ? HAD 1 SEVERAL YRS AGO...3-4 YR  ? CHF (congestive heart failure) ([place])   ? Chronic kidney disease   ? SOME DECREASE IN KIDNEY FUNCTION  ? Diabetes mellitus without complication ([place])   ? Hypertension   ? Normal cardiac stress test   ? REQUESTING [place]  ? Normal echocardiogram   ? REQUESTING THAT RESULT -- DR [name]   ? PONV (postoperative nausea and vomiting)   ? Shortness of breath   ? Sleep apnea   ? 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 ?Social History  ? ?Socioeconomic History  ? Marital status: Widowed  ?  Spouse name: Not on file  ? Number of children: 1  ? Years of education: Not on file  ? Highest education level: Bachelor's degree (e.g., BA, AB, BS)  ?Occupational History  ? Occupation: retired  ?Tobacco Use  ? Smoking status: Never  ? Smokeless tobacco: Never  ?Substance and Sexual Activity  ? Alcohol use: No  ? Drug use: No  ? Sexual activity: Never  ?Other Topics Concern  ? Not on file  ?Social History Narrative  ? Not on file  ? ?Social Determinants of Health  ? ?Financial Resource Strain: Not on file  ?Food Insecurity: Not on file  ?Transportation Needs: Not on file  ?Physical Activity: Not on file  ?Stress: Not on file  ?Social Connections: Not on file  ? ?Family History  ?Problem Relation Age of Onset  ? Heart failure Father   ? Heart  disease Father   ? Hypertension Mother   ? Diabetes Mother   ? Heart disease Mother   ? Cancer Sister   ?     breast  ? Breast cancer Sister 53  ? Cancer Maternal Grandfather   ? Cancer Maternal Aunt   ?     x3  ? Breast cancer Daughter 23  ? ?Scheduled Meds: ? carbidopa-levodopa  1 tablet Oral TID  ? Chlorhexidine Gluconate Cloth  6 each Topical Daily  ? DULoxetine  30 mg Oral Daily  ? enoxaparin (LOVENOX) injection  30 mg Subcutaneous Q24H  ?  insulin aspart  0-15 Units Subcutaneous TID WC  ? insulin aspart  0-5 Units Subcutaneous QHS  ? [START ON 10/13/2021] methylPREDNISolone (SOLU-MEDROL) injection  40 mg Intravenous Daily  ? metoprolol succinate  12.5 mg Oral Daily  ? pantoprazole  40 mg Oral Daily  ? rOPINIRole  0.5 mg Oral TID  ? sodium chloride flush  3 mL Intravenous Q12H  ? ?Continuous Infusions: ? sodium chloride    ? azithromycin 500 mg (10/11/21 1452)  ? ceFEPime (MAXIPIME) IV 2 g (10/12/21 0950)  ? ?PRN Meds:.sodium chloride, acetaminophen, ALPRAZolam, fluticasone, ipratropium-albuterol, ondansetron (ZOFRAN) IV, oxybutynin, oxyCODONE, polyvinyl alcohol, sodium chloride flush ?Medications Prior to Admission:  ?Prior to Admission medications   ?Medication Sig Start Date End Date Taking? Authorizing Provider  ?albuterol (VENTOLIN HFA) 108 (90 Base) MCG/ACT inhaler INHALE 2 PUFFS INTO THE LUNGS EVERY 4 HOURS AS NEEDED FOR WHEEZING OR SHORTNESS OF BREATH 02/19/21  Yes Jerrol Banana., MD  ?carbidopa-levodopa (SINEMET IR) 25-100 MG tablet Take 1 tablet by mouth 3 (three) times daily. Take 1 tablet three times a day 05/21/20  Yes [provider]  ?Coenzyme Q10 (CO Q10) 200 MG CAPS Take 1 capsule by mouth daily. 08/09/11  Yes [provider]  ?DULoxetine (CYMBALTA) 30 MG capsule TAKE 1 CAPSULE(30 MG) BY MOUTH DAILY 07/28/21  Yes Jerrol Banana., MD  ?empagliflozin (JARDIANCE) 10 MG TABS tablet Take by mouth daily.   Yes [provider]  ?esomeprazole (NEXIUM) 40 MG capsule  TAKE 1 CAPSULE BY MOUTH EVERY DAY 05/21/21  Yes Birdie Sons, MD  ?fluticasone (FLONASE) 50 MCG/ACT nasal spray Place 2 sprays into both nostrils daily. 07/28/21  Yes [name], PA-C  ?furosemide (LASIX

## 2021-10-12 NOTE — Consult Note (Signed)
? ?  Heart Failure Nurse Navigator Note ? ?HFrEF 25 to 30%.  Left ventricle internal cavity size is mildly to moderately dilated.  Grade 1 diastolic dysfunction.  Moderate biatrial enlargement.  Moderate to severe mitral regurgitation.  Mild to moderate tricuspid regurgitation.  And mild aortic valve insufficiency. ? ?She presented to the emergency room with complaints of shortness of breath, fatigue and altered mentation. ? ?Comorbidities: ? ?Chronic kidney disease stage III ?Diabetes ?Parkinsonism ?Obstructive sleep apnea ?Hypertension ? ?Medications: ? ?Metoprolol succinate 12-1/2 mg daily ? ?At home she is on furosemide and Jardiance, she states that she is compliant with her medications. ? ?Labs: ? ?Sodium 138, potassium 4, chloride 101, CO2 28, BUN 58 up from 48 of yesterday, creatinine 2.46 up from 2.37 of yesterday ?Weight is 68.4 kg ?Blood pressure 139/83 ?Intake 240 mL ?Output 750 mL ? ? ?Initial meeting with patient, she states that her caretaker Abe People had just stepped away.  She states that [place] the [place], she has caretakers that are with her during the week and then Abe People is there on the weekend.  She could tell me the month and the year and that she was in the hospital. ? ?Her meals are supplied by the facility, she does not add salt nor did they feel that she goes over the 64 ounces and she likes to suck on ice rather than drinking a lot of liquids. ? ?She states that her caretakers do not weigh her on a daily basis, suggested getting into the habit of weighing daily. ? ?Made aware of follow up in outpatient heart failure clinic, on May 17 at 2:30 in the afternoon.  She has an 8% no-show ratio which is 7 out of 8 appointments. ? ?She was given the living with heart failure teaching booklet, zone magnet, information on low-sodium and weight chart. ? ?They had no further questions. ? ?Pricilla Riffle RN [facility] ?

## 2021-10-12 NOTE — Progress Notes (Signed)
Unable to tolerate Cpap. Placed back on O2 @93 % on 4liters ?

## 2021-10-13 DIAGNOSIS — Z7189 Other specified counseling: Secondary | ICD-10-CM | POA: Diagnosis not present

## 2021-10-13 DIAGNOSIS — I509 Heart failure, unspecified: Secondary | ICD-10-CM | POA: Diagnosis not present

## 2021-10-13 DIAGNOSIS — Z515 Encounter for palliative care: Secondary | ICD-10-CM | POA: Diagnosis not present

## 2021-10-13 DIAGNOSIS — A419 Sepsis, unspecified organism: Secondary | ICD-10-CM | POA: Diagnosis not present

## 2021-10-13 LAB — CBC WITH DIFFERENTIAL/PLATELET
Abs Immature Granulocytes: 0.31 10*3/uL — ABNORMAL HIGH (ref 0.00–0.07)
Basophils Absolute: 0 10*3/uL (ref 0.0–0.1)
Basophils Relative: 0 %
Eosinophils Absolute: 0 10*3/uL (ref 0.0–0.5)
Eosinophils Relative: 0 %
HCT: 36.9 % (ref 36.0–46.0)
Hemoglobin: 12.4 g/dL (ref 12.0–15.0)
Immature Granulocytes: 3 %
Lymphocytes Relative: 20 %
Lymphs Abs: 2.1 10*3/uL (ref 0.7–4.0)
MCH: 38.2 pg — ABNORMAL HIGH (ref 26.0–34.0)
MCHC: 33.6 g/dL (ref 30.0–36.0)
MCV: 113.5 fL — ABNORMAL HIGH (ref 80.0–100.0)
Monocytes Absolute: 0.8 10*3/uL (ref 0.1–1.0)
Monocytes Relative: 8 %
Neutro Abs: 7 10*3/uL (ref 1.7–7.7)
Neutrophils Relative %: 69 %
Platelets: 289 10*3/uL (ref 150–400)
RBC: 3.25 MIL/uL — ABNORMAL LOW (ref 3.87–5.11)
RDW: 18.8 % — ABNORMAL HIGH (ref 11.5–15.5)
Smear Review: NORMAL
WBC: 10.2 10*3/uL (ref 4.0–10.5)
nRBC: 0.3 % — ABNORMAL HIGH (ref 0.0–0.2)

## 2021-10-13 LAB — GLUCOSE, CAPILLARY
Glucose-Capillary: 199 mg/dL — ABNORMAL HIGH (ref 70–99)
Glucose-Capillary: 170 mg/dL — ABNORMAL HIGH (ref 70–99)
Glucose-Capillary: 265 mg/dL — ABNORMAL HIGH (ref 70–99)
Glucose-Capillary: 267 mg/dL — ABNORMAL HIGH (ref 70–99)
Glucose-Capillary: 198 mg/dL — ABNORMAL HIGH (ref 70–99)

## 2021-10-13 LAB — BASIC METABOLIC PANEL
Anion gap: 10 (ref 5–15)
BUN: 71 mg/dL — ABNORMAL HIGH (ref 8–23)
CO2: 27 mmol/L (ref 22–32)
Calcium: 8.6 mg/dL — ABNORMAL LOW (ref 8.9–10.3)
Chloride: 102 mmol/L (ref 98–111)
Creatinine, Ser: 2.52 mg/dL — ABNORMAL HIGH (ref 0.44–1.00)
GFR, Estimated: 17 mL/min — ABNORMAL LOW (ref >60)
Glucose, Bld: 184 mg/dL — ABNORMAL HIGH (ref 70–99)
Potassium: 4.3 mmol/L (ref 3.5–5.1)
Sodium: 139 mmol/L (ref 135–145)

## 2021-10-13 LAB — PHOSPHORUS: Phosphorus: 3.5 mg/dL (ref 2.5–4.6)

## 2021-10-13 MED ORDER — MAGIC MOUTHWASH W/LIDOCAINE
10.0000 mL | Freq: Three times a day (TID) | ORAL | Status: DC | PRN
  Administered 2021-10-14: 10 mL via ORAL
  Filled 2021-10-13 (×3): qty 10

## 2021-10-13 MED ORDER — GABAPENTIN 100 MG PO CAPS
200.0000 mg | ORAL_CAPSULE | Freq: Three times a day (TID) | ORAL | Status: DC
  Administered 2021-10-13 – 2021-10-14 (×4): 200 mg via ORAL
  Filled 2021-10-13 (×4): qty 2

## 2021-10-13 MED ORDER — PHENOL 1.4 % MT LIQD
1.0000 | OROMUCOSAL | Status: DC | PRN
  Administered 2021-10-13 – 2021-10-14 (×4): 1 via OROMUCOSAL
  Filled 2021-10-13: qty 177

## 2021-10-13 NOTE — TOC Progression Note (Signed)
Transition of Care (TOC) - Progression Note  ? ? ?Patient Details  ?Name: Renee Carpenter ?MRN: [id number] ?Date of Birth: 06-04-1926 ? ?Transition of Care (TOC) CM/SW Contact  ?Laurena Slimmer, RN ?Phone Number: ?10/13/2021, 2:26 PM ? ?Clinical Narrative:    ?Spoke with patient's POA, Carles Collet. Abe People stated he would talk to patient and they would decide if patient would return to ILF or SNF. Abe People provided information about [facility].  ? ? ?  ?  ? ?Expected Discharge Plan and Services ?  ?  ?  ?  ?  ?                ?  ?  ?  ?  ?  ?  ?  ?  ?  ?  ? ? ?Social Determinants of Health (SDOH) Interventions ?  ? ?Readmission Risk Interventions ?   ? View : No data to display.  ?  ?  ?  ? ? ?

## 2021-10-13 NOTE — Consult Note (Signed)
[facility] CM Inpatient Consult ? ? ?10/13/2021 ? ?Renee Carpenter ?1926/05/28 ?[id number] ? ?[facility] service manager Care Management [facility] CM) ?  ?Patient chart has been reviewed with noted high risk score for unplanned readmissions.  Patient assessed for community [facility] Care Management follow up needs. Per review, current recommendation is for SNF.  ? ?Plan: Continue to follow for progress and disposition. If transitions to SNF, no [facility] CM needs. ?  ?Of note, [facility] Care Management services does not replace or interfere with any services that are arranged by inpatient case management or social work.  ?  ?Christophe Louis, MSN, RN ?[facility] [employer] Liaison ?Phone [phone] ?Toll free office [phone]   ? ?

## 2021-10-13 NOTE — Progress Notes (Signed)
PT Cancellation Note ? ?Patient Details ?Name: Renee Carpenter ?MRN: [id number] ?DOB: 1925-12-26 ? ? ?Cancelled Treatment:    Reason Eval/Treat Not Completed: Patient declined, no reason specified Upon entering room patient was fanning herself off with her food tray. Patient refused therapy despite max encouragement, stating that she just got her lunch and would like to eat it first. Per patient she was waiting on a feeder. RN/NT called, however patient does not have a feeder at this time. Will re-attempt at a later time/date as available and patient medically appropriate for PT. Thank you! ? ? ?Iva Boop, PT  ?10/13/21. 3:00 PM ? ?

## 2021-10-13 NOTE — Progress Notes (Signed)
Palliative: ?[name], Shiana, is lying quietly in bed.  She appears acutely/chronically ill and quite frail.  She is alert, oriented to person, place, situation.  I believe that she can make her basic needs known.  Her healthcare power of attorney, Melford Aase, is present at bedside along with her daily paid sitter.  Respiratory therapy is present also administering breathing treatment. ? ?I share with [name] that she is very sick.  She asks, "are you trying to ask if I am ready to die?", to which I ask, "are you?".  She tells me that she is not.  I share that we will continue to give her medicines, check labs, provide treatment.  We talk about acute heart failure and fluid overload, the heart needs to stay dry but the kidneys need to be wet.  We talk about nephrology visit and I ask what the nephrologist shared with her.  [name] shares that he was also asking if she was ready to be "deceased".  As we continue our conversation it seems that she becomes more confused.  She shares that she wants to "live to 88, 86, 86, 95, 86, 86, 86, 19".  She tells her healthcare agent, Genevie Cheshire, that she wants to live to be 88 years old.  ? ?Billy and I talk in the hallway about fluid overload and heart failure.  We talk about the treatment plan.  I share that at this point we are continuing to treat, but also looking for meaningful improvements.  I shared that if [name] has not made meaningful improvements in the next few days she may be ready for hospice care.  Billy states, "we cannot prepare for that".  I share that it is important to prepare, to make the choices that [name] would or would not want.  I share that as long as she is alert and oriented we will follow her wishes, otherwise he, as her healthcare agent, would be the decision maker ? ?Detail phone conference with attending.  Conference with bedside nursing staff, transition of care team related to patient condition, needs,  goals of care, disposition. ? ?Plan:    At this point continue to treat the treatable but no CPR or intubation.  Time for outcomes.  Short-term rehab at [facility] with ultimate goal to return to her own ILF with her paid caregiver/sitter. ? ?60 minutes  ?Lillia Carmel, NP ?Palliative medicine team ?Team phone [phone] ?Greater than 50% of this time was spent counseling and coordinating care related to the above assessment and plan. ?

## 2021-10-13 NOTE — Care Management Important Message (Signed)
Important Message ? ?Patient Details  ?Name: Renee Carpenter ?MRN: [id number] ?Date of Birth: Oct 13, 1925 ? ? ?Medicare Important Message Given:  N/A - LOS <3 / Initial given by admissions ? ? ? ? ?Johnell Comings ?10/13/2021, 8:41 AM ?

## 2021-10-13 NOTE — Progress Notes (Signed)
Renee Carpenter  MRN: [id number]  DOB/AGE: 1926/06/01 88 y.o. y.o.  Primary Care Physician:Gilbert, Leonette Monarch., MD  Admit date: 10/09/2021  Chief Complaint:  Chief Complaint  Patient presents with   Shortness of Breath   Fever    S-Pt presented on  10/09/2021 with  Chief Complaint  Patient presents with   Shortness of Breath   Fever  .   Patient is a 88 year old Caucasian female with a past medical history of chronic systolic CHF with ejection fraction of around 25 to 30%, CKD stage IIIb, diabetes mellitus type 2, Parkinson's, obstructive sleep apnea, hypertension who was brought to the ER with chief complaint of shortness of breath.      carbidopa-levodopa  1 tablet Oral TID   Chlorhexidine Gluconate Cloth  6 each Topical Daily   DULoxetine  30 mg Oral Daily   enoxaparin (LOVENOX) injection  30 mg Subcutaneous Q24H   gabapentin  200 mg Oral TID   insulin aspart  0-15 Units Subcutaneous TID WC   insulin aspart  0-5 Units Subcutaneous QHS   methylPREDNISolone (SOLU-MEDROL) injection  40 mg Intravenous Daily   metoprolol succinate  12.5 mg Oral Daily   pantoprazole  40 mg Oral Daily   rOPINIRole  0.5 mg Oral TID   sodium chloride flush  3 mL Intravenous Q12H         [id number] from the symptoms mentioned above,there are no other symptoms referable to all systems reviewed.  Physical Exam: Vital signs in last 24 hours: Temp:  [96.8 F (36 C)-98.6 F (37 C)] 97.4 F (36.3 C) (05/09 0829) Pulse Rate:  [77-88] 82 (05/09 0829) Resp:  [18-23] 18 (05/09 0829) BP: (115-139)/(58-91) 134/85 (05/09 0829) SpO2:  [91 %-99 %] 99 % (05/09 0829) Weight:  [70.2 kg] 70.2 kg (05/09 0449) Weight change: 1.8 kg Last BM Date : 10/09/21  Intake/Output from previous day: 05/08 0701 - 05/09 0700 In: 582.3 [P.O.:480; IV Piggyback:102.3] Out: 650 [Urine:650] No intake/output data recorded.   Physical Exam:  General- pt is awake,alert, Follows commands Resp- No acute REsp  distress,  Rhonchi+, Decreased breath sounds at bases CVS- S1S2 regular in rate and rhythm GIT- BS+, soft, NT, ND EXT- NO LE Edema, Cyanosis CNS- CN 2-12 grossly intact. Moving all 4 extremities Psych- normal mood and affect  Lab Results:  CBC  Recent Labs    10/12/21 0326 10/13/21 0752  WBC 10.5 10.2  HGB 12.0 12.4  HCT 34.9* 36.9  PLT 234 289    BMET  Recent Labs    10/12/21 0326 10/13/21 0752  NA 138 139  K 4.0 4.3  CL 101 102  CO2 28 27  GLUCOSE 205* 184*  BUN 58* 71*  CREATININE 2.46* 2.52*  CALCIUM 8.3* 8.6*      Most recent Creatinine trend  Lab Results  Component Value Date   CREATININE 2.52 (H) 10/13/2021   CREATININE 2.46 (H) 10/12/2021   CREATININE 2.37 (H) 10/11/2021      MICRO   Recent Results (from the past 240 hour(s))  Resp Panel by RT-PCR (Flu A&B, Covid) Nasopharyngeal Swab     Status: None   Collection Time: 10/09/21  1:45 PM   Specimen: Nasopharyngeal Swab; Nasopharyngeal(NP) swabs in vial transport medium  Result Value Ref Range Status   SARS Coronavirus 2 by RT PCR NEGATIVE NEGATIVE Final    Comment: (NOTE) SARS-CoV-2 target nucleic acids are NOT DETECTED.  The SARS-CoV-2 RNA is generally detectable in upper respiratory specimens during the acute  phase of infection. The lowest concentration of SARS-CoV-2 viral copies this assay can detect is 138 copies/mL. A negative result does not preclude SARS-Cov-2 infection and should not be used as the sole basis for treatment or other patient management decisions. A negative result may occur with  improper specimen collection/handling, submission of specimen other than nasopharyngeal swab, presence of viral mutation(s) within the areas targeted by this assay, and inadequate number of viral copies(<138 copies/mL). A negative result must be combined with clinical observations, patient history, and epidemiological information. The expected result is Negative.  Fact Sheet for Patients:   BloggerCourse.com  Fact Sheet for Healthcare Providers:  SeriousBroker.it  This test is no t yet approved or cleared by the [place] FDA and  has been authorized for detection and/or diagnosis of SARS-CoV-2 by FDA under an Emergency Use Authorization (EUA). This EUA will remain  in effect (meaning this test can be used) for the duration of the COVID-19 declaration under Section 564(b)(1) of the Act, 21 U.S.C.section 360bbb-3(b)(1), unless the authorization is terminated  or revoked sooner.       Influenza A by PCR NEGATIVE NEGATIVE Final   Influenza B by PCR NEGATIVE NEGATIVE Final    Comment: (NOTE) The Xpert Xpress SARS-CoV-2/FLU/RSV plus assay is intended as an aid in the diagnosis of influenza from Nasopharyngeal swab specimens and should not be used as a sole basis for treatment. Nasal washings and aspirates are unacceptable for Xpert Xpress SARS-CoV-2/FLU/RSV testing.  Fact Sheet for Patients: BloggerCourse.com  Fact Sheet for Healthcare Providers: SeriousBroker.it  This test is not yet approved or cleared by the [place] FDA and has been authorized for detection and/or diagnosis of SARS-CoV-2 by FDA under an Emergency Use Authorization (EUA). This EUA will remain in effect (meaning this test can be used) for the duration of the COVID-19 declaration under Section 564(b)(1) of the Act, 21 U.S.C. section 360bbb-3(b)(1), unless the authorization is terminated or revoked.  Performed at [facility], [address] [place], [place] [address]   Blood Culture (routine x 2)     Status: None (Preliminary result)   Collection Time: 10/09/21  1:45 PM   Specimen: BLOOD RIGHT HAND  Result Value Ref Range Status   Specimen Description BLOOD RIGHT HAND  Final   Special Requests   Final    BOTTLES DRAWN AEROBIC AND ANAEROBIC Blood Culture adequate volume    Culture   Final    NO GROWTH 4 DAYS Performed at [facility], [address], [place], [place] [address]    Report Status PENDING  Incomplete  Blood Culture (routine x 2)     Status: None (Preliminary result)   Collection Time: 10/09/21  1:45 PM   Specimen: BLOOD  Result Value Ref Range Status   Specimen Description BLOOD LEFT ANTECUBITAL  Final   Special Requests   Final    BOTTLES DRAWN AEROBIC AND ANAEROBIC Blood Culture adequate volume   Culture   Final    NO GROWTH 4 DAYS Performed at [facility], [address] [place], [place] [address]    Report Status PENDING  Incomplete  MRSA Next Gen by PCR, Nasal     Status: None   Collection Time: 10/10/21  9:21 AM   Specimen: Nasal Mucosa; Nasal Swab  Result Value Ref Range Status   MRSA by PCR Next Gen NOT DETECTED NOT DETECTED Final    Comment: (NOTE) The GeneXpert MRSA Assay (FDA approved for NASAL specimens only), is one component  of a comprehensive MRSA colonization surveillance program. It is not intended to diagnose MRSA infection nor to guide or monitor treatment for MRSA infections. Test performance is not FDA approved in patients less than 52 years old. Performed at [facility], [address] [place], [place] [address]      Renal ultrasound done on August 12, 2021 was reviewed FINDINGS: Right Kidney:   Renal measurements: 10 x 4.2 x 3.8 cm = volume: 83 mL. The right kidney demonstrates cortical atrophy and increased cortical echogenicity. No hydronephrosis or focal lesion identified.   Left Kidney:   Renal measurements: 9.2 x 4.4 x 3.5 cm = volume: 75 mL. The left kidney demonstrates increased cortical echogenicity. No hydronephrosis or focal lesion identified.   Bladder:   The bladder is decompressed by a Foley catheter.   Other:   None.   IMPRESSION: Increased cortical echogenicity of both kidneys is consistent with underlying chronic kidney disease. No  hydronephrosis.   Impression:  1)Renal   Acute kidney injury AKI secondary to ATN AKI secondary to multiple factors AKI secondary to UTI/sepsis AKI secondary to hypotension as patient has SBP of less than 80 recently AKI secondary to medications as patient was recently started on Jardiance AKI secondary to cardiorenal syndrome   Patient has AKI on CKD Patient has CKD stage IIIb Patient has CKD age associated decline As patient is 88 years old Patient has CKD since 2016 Patient has CKD secondary to diabetes mellitus     AKI is worsening. Will follow closely    Creatinine trend 2023 1.4==>2.4==>2.5-AKI 2022 1.4--1.6 2021  1.2--1.3 2020  1.6 2019   1.3 2018   0.9--1.1 2017  1.2--1.4 2016  1.5       2)Hypotension Patient blood pressure was low earlier Patient blood pressure is now better   3)Anemia of chronic disease     Latest Ref Rng & Units 10/13/2021    7:52 AM 10/12/2021    3:26 AM 10/11/2021    4:04 AM  CBC  WBC 4.0 - 10.5 K/uL 10.2   10.5   12.6    Hemoglobin 12.0 - 15.0 g/dL 60.4   54.0   98.1    Hematocrit 36.0 - 46.0 % 36.9   34.9   32.9    Platelets 150 - 400 K/uL 289   234   179         HGb at goal (9--11)   4) Secondary hyperparathyroidism -CKD Mineral-Bone Disorder    Lab Results  Component Value Date   CALCIUM 8.6 (L) 10/13/2021   PHOS 3.5 10/13/2021    Secondary Hyperparathyroidism w/u pending.  Phosphorus at goal.  5)Chronic systolic CHF  Patient has no edema Patient chest x-ray done today shows small bilateral pleural effusion Does not show much fluid overload We will hold off on giving any diuretics for now   6) Electrolytes      Latest Ref Rng & Units 10/13/2021    7:52 AM 10/12/2021    3:26 AM 10/11/2021    4:04 AM  BMP  Glucose 70 - 99 mg/dL 191   478   295    BUN 8 - 23 mg/dL 71   58   48    Creatinine 0.44 - 1.00 mg/dL 6.21   3.08   6.57    Sodium 135 - 145 mmol/L 139   138   139    Potassium 3.5 - 5.1 mmol/L 4.3    4.0   3.9  Chloride 98 - 111 mmol/L 102   101   97    CO2 22 - 32 mmol/L 27   28   28     Calcium 8.9 - 10.3 mg/dL 8.6   8.3   8.1       Sodium Normonatremic   Potassium Normokalemic    7)Acid base    Co2 at goal     Plan:  Pt AKI is worsening , No need for HD today Will follow closely        Merisa Julio s [facility] 10/13/2021, 9:52 AM

## 2021-10-13 NOTE — Progress Notes (Addendum)
?PROGRESS NOTE ? ? ? ?Renee Carpenter  [id number] DOB: Nov 12, 1925 DOA: 10/09/2021 ?PCP: Jerrol Banana., MD  ? ? ?Brief Narrative:  ?88 y.o. female with medical history significant of systolic congestive heart failure EF 25 to 30%, CKD stage IIIa, type 2 diabetes mellitus, parkinsonism, obstructive sleep apnea, hypertension presents for evaluation of progressively worsening shortness of breath associated with fatigue and altered mentation.  [name] is compliance with all medications.  No abdominal pain ?  ?No home oxygen requirement.  On presentation brought in by EMS on 4 L nasal cannula after being found hypoxic at home.  Patient is a poor historian.  History obtained from multiple sources including the patient, patient's husband, ER physician, ER documentation. ?  ?On my evaluation patient is uncomfortable appearing.  Difficult to obtain history from.  Per husband at bedside patient is complaining of lower abdominal pain.  Urine drawn on admission is concerning for urinary tract infection although patient is afebrile with normal lactic acidosis and no leukocytosis.  Patient was febrile with Tmax 102.4.  Patient has lab work including elevated BNP and chest x-ray that demonstrates evidence of interstitial edema. ? ?Alternates between agitated and lethargic.  Mental status remains poor.  No change in oxygen requirement.  Breakthrough fevers noted ? ?Acutely hypotensive 5/6.  Midodrine, stress dose steroids, albumin administered.  Blood pressure responded.  On 5/7 much more awake.  Remains on 4 L O2. ? ?5/9.  Shock physiology improved.  Midodrine weaned off with stabilization of blood pressure ? ?Assessment & Plan: ?  ?Principal Problem: ?  Acute decompensated heart failure ([place]) ? ?Acute decompensated systolic congestive heart failure ?Known recent EF from February 14 was 25 to 30% ?Patient is on home Lasix, endorses compliance ?Suspect that heart failure exacerbation occurred as a result of urinary  tract infection ?No lower extremity edema, interstitial edema on CXR ?Received 60 IV Lasix in ED ?UOP reassuring ?Kidney function subsequently worsened ?Patient does not appear grossly volume overloaded on exam ?Plan: ?Hold Lasix for now ?Defer repeat echocardiogram ?Continue ins and outs and daily weights ?Continue telemetry monitoring ?Cardiology following, recommendations appreciated, [place] [facility] ?  ?Acute hypoxic respiratory failure ?Patient on room air at baseline ?Presents with a 4 L requirement ?Saturating low 90s on 4 L ?Suspect secondary to interstitial edema versus possible infectious process versus OSA ?PE also on differential, though low suspicion at this time ? ?Plan: ?Continue oxygen via nasal cannula ?Target oxygen saturation greater than 92% ?Wean oxygen as tolerated ?If unable to wean from oxygen consider VQ scan rule out PE ?  ?Elevated troponin ?No chest pain ?Likely demand ischemia ?Plan: ?Continue telemetry ?  ?AKI on CKD stage IIIb ?Baseline creatinine difficult ascertain ?Appears to be approximately 1.2-1.3 ?1.69 on presentation ?Subsequently worsened to 2. 5 to ?Plan: ?Diuresis on hold ?Gabapentin restarted at low-dose ?Hold Lyrica ?Daily kidney function ?Avoid nonessential nephrotoxins ?Nephrology engaged 5/8 ?Avoid hypotension ?No indication for HD at this time ?  ?Urinary tract infection ?Sepsis secondary to above ?Bladder spasms ?Unable to exclude sepsis ?Urinalysis concerning for infection ?Patient with fever Tmax 102.4 ?Started on Rocephin and azithromycin, breakthrough fevers noted ?Question regarding alternative source of infection, unclear ?MRSA negative ?Vancomycin stopped ?Plan: ?Continue cefepime, 5-day stop date ?Completed 3-day course of azithromycin, will stop ?Follow blood cultures, no growth to date ?Unfortunately urine culture not sent ?No IV fluids ?Monitor vitals and fever curve ?  ?Type 2 diabetes mellitus with hyperglycemia and neuropathic pain ?Hold oral  agents ?Moderate  sliding scale ?Carb modified diet ?PTA gabapentin on hold ?  ?Obstructive sleep apnea ?Patient on home CPAP but compliance poor due to difficulty with mask ?Nightly CPAP as tolerated ?  ?Parkinsonism ?Restless leg ?PTA 3 times daily Sinemet ?PTA 3 times daily Requip ?  ?GERD. ?PPI ?  ?Depression ?Anxiety ?PTA Cymbalta ?As needed Xanax ? ? ?DVT prophylaxis: SQ Lovenox ?Code Status: DNR ?Family Communication: friend Abe People [phone] on 5/9.   ?Disposition Plan: Status is: Inpatient ?Remains inpatient appropriate because: Sepsis, acute decompensated heart failure, acute metabolic encephalopathy.  Slowly improving.  Will need skilled nursing facility   ? ? ?Level of care: Progressive ? ?Consultants:  ?Cardiology ?Palliative care ? ?Procedures:  ?None ? ?Antimicrobials: ?Vancomycin ?Cefepime ?Azithromycin ? ? ?Subjective: ?Seen and examined.  More awake and responsive this morning ? ?Objective: ?Vitals:  ? 10/12/21 2342 10/13/21 0449 10/13/21 0829 10/13/21 1149  ?BP: (!) 115/58 136/73 134/85 134/71  ?Pulse: 83 77 82 77  ?Resp: 18 (!) 23 18 16   ?Temp: 98.6 ?F (37 ?C) (!) 97.3 ?F (36.3 ?C) (!) 97.4 ?F (36.3 ?C) (!) 97.4 ?F (36.3 ?C)  ?TempSrc:      ?SpO2: 91% 93% 99% 97%  ?Weight:  70.2 kg    ?Height:      ? ? ?Intake/Output Summary (Last 24 hours) at 10/13/2021 1157 ?Last data filed at 10/12/2021 1819 ?Gross per 24 hour  ?Intake 582.28 ml  ?Output 650 ml  ?Net -67.72 ml  ? ?Filed Weights  ? 10/11/21 0500 10/12/21 0402 10/13/21 0449  ?Weight: 70.7 kg 68.4 kg 70.2 kg  ? ? ?Examination: ? ?General exam: No acute distress.  Awake alert. ?Respiratory system: Bibasilar crackles.  Normal work of breathing.  2 L ?Cardiovascular system: S1-S2, regular rhythm, no murmurs ?Gastrointestinal system: Soft, NT/ND, normal bowel sounds ?Central nervous system: Alert and oriented x1, no focal deficits ?Extremities: Diffusely decreased power bilaterally ?Skin: No rashes, lesions or ulcers ?Psychiatry: Judgement and insight  appear impaired. Mood & affect confused.  ? ? ? ?Data Reviewed: I have personally reviewed following labs and imaging studies ? ?CBC: ?Recent Labs  ?Lab 10/09/21 ?1345 10/10/21 ?[id number] 10/11/21 ?0404 10/12/21 ?[id number] 10/13/21 ?[id number]  ?WBC 6.4 13.5* 12.6* 10.5 10.2  ?NEUTROABS  --  11.3* 11.0* 8.2* 7.0  ?HGB 11.7* 13.1 11.3* 12.0 12.4  ?HCT 35.0* 38.4 32.9* 34.9* 36.9  ?MCV 115.5* 115.7* 114.2* 114.1* 113.5*  ?PLT 207 192 179 234 289  ? ?Basic Metabolic Panel: ?Recent Labs  ?Lab 10/09/21 ?1345 10/10/21 ?[id number] 10/11/21 ?0404 10/12/21 ?[id number] 10/13/21 ?[id number]  ?NA 137 136 139 138 139  ?K 4.1 4.5 3.9 4.0 4.3  ?CL 97* 94* 97* 101 102  ?CO2 32 30 28 28 27   ?GLUCOSE 176* 192* 229* 205* 184*  ?BUN 29* 34* 48* 58* 71*  ?CREATININE 1.69* 2.03* 2.37* 2.46* 2.52*  ?CALCIUM 8.4* 8.3* 8.1* 8.3* 8.6*  ?PHOS  --   --   --   --  3.5  ? ?GFR: ?Estimated Creatinine Clearance: 13.7 mL/min (A) (by C-G formula based on SCr of 2.52 mg/dL (H)). ?Liver Function Tests: ?Recent Labs  ?Lab 10/09/21 ?1345  ?AST 28  ?ALT 7  ?ALKPHOS 69  ?BILITOT 0.9  ?PROT 6.8  ?ALBUMIN 3.4*  ? ?No results for input(s): LIPASE, AMYLASE in the last 168 hours. ?No results for input(s): AMMONIA in the last 168 hours. ?Coagulation Profile: ?No results for input(s): INR, PROTIME in the last 168 hours. ?Cardiac Enzymes: ?No results for input(s): CKTOTAL, CKMB, CKMBINDEX, TROPONINI in the  last 168 hours. ?BNP (last 3 results) ?No results for input(s): PROBNP in the last 8760 hours. ?HbA1C: ?No results for input(s): HGBA1C in the last 72 hours. ?CBG: ?Recent Labs  ?Lab 10/12/21 ?1131 10/12/21 ?1617 10/12/21 ?2046 10/13/21 ?0820 10/13/21 ?1148  ?GLUCAP 211* 181* 184* 199* 170*  ? ?Lipid Profile: ?No results for input(s): CHOL, HDL, LDLCALC, TRIG, CHOLHDL, LDLDIRECT in the last 72 hours. ?Thyroid Function Tests: ?No results for input(s): TSH, T4TOTAL, FREET4, T3FREE, THYROIDAB in the last 72 hours. ?Anemia Panel: ?No results for input(s): VITAMINB12, FOLATE, FERRITIN, TIBC, IRON,  RETICCTPCT in the last 72 hours. ?Sepsis Labs: ?Recent Labs  ?Lab 10/09/21 ?1345 10/09/21 ?1542 10/09/21 ?1700 10/10/21 ?0416 10/10/21 ?1722  ?PROCALCITON  --   --  <0.10 0.11  --   ?LATICACIDVEN 1.5 1.2  --   --

## 2021-10-13 NOTE — Progress Notes (Signed)
Patient requesting for a feeder. Her family notified us they she is having hard time swallowing her food. Provider notified and speech consult in place. ? ? ? ?

## 2021-10-13 NOTE — Progress Notes (Signed)
[facility] Cardiology ? ?CARDIOLOGY CONSULT NOTE  ?Patient ID: ?[place] ?MRN: [id number] ?DOB/AGE: August 03, 1925 88 y.o. ? ?Admit date: 10/09/2021 ?Referring Physician Ralene Muskrat ?Primary Physician Jerrol Banana., MD ?Primary Cardiologist Nehemiah Massed ?Reason for Consultation AoCHF ? ?HPI:  ?Renee Carpenter is a 88 year old female with history of CHF (EF25-30%), chronic left bundle branch block, CKD 3, hypertension, hyperlipidemia was admitted with shortness of breath, fatigue, altered mental status.  She was discovered to have sepsis from a urinary source.  Cardiology is consulted for further assistance. ? ?Interval history: ?- laying in bed with caregiver and POA (family friend) at bedside ?- didn't sleep well last night, sleeping on my entry into the room ?- worsening AKI ? ? ?Review of systems complete and found to be negative unless listed above  ? ? ? ?Past Medical History:  ?Diagnosis Date  ? Anemia   ? WAS ANEMIC  ? Arthritis   ? Blood transfusion   ? HAD 1 SEVERAL YRS AGO...3-4 YR  ? CHF (congestive heart failure) ([place])   ? Chronic kidney disease   ? SOME DECREASE IN KIDNEY FUNCTION  ? Diabetes mellitus without complication ([place])   ? Hypertension   ? Normal cardiac stress test   ? REQUESTING [place]  ? Normal echocardiogram   ? REQUESTING THAT RESULT -- DR [name]   ? PONV (postoperative nausea and vomiting)   ? Shortness of breath   ? Sleep apnea   ? 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  ?Past Surgical History:  ?Procedure Laterality Date  ? ABDOMINAL HYSTERECTOMY    ? BACK SURGERY    ? 2 CERVICAL SURGERIES ALSO  ? CATARACTS    ? RIGHT EYE CATARACT  ? CERVICAL DISCECTOMY  08/1995 and 2011  ? C4-,C5-6, C6-7 anterior cervical; Dr. Ellene Route fusion and plating  ? LAMINECTOMY    ? decompressive; Dr. Burman Riis in [place] MICRODISCECTOMY  04/14/2011  ? Procedure: LUMBAR LAMINECTOMY/DECOMPRESSION MICRODISCECTOMY;  Surgeon: [name];  Location: [place] [facility];   Service: Neurosurgery;  Laterality: N/A;  Lumbar Three and Lumbar Four Laminectomy  ? TONSILLECTOMY AND ADENOIDECTOMY  1939  ?  ?Medications Prior to Admission  ?Medication Sig Dispense Refill Last Dose  ? albuterol (VENTOLIN HFA) 108 (90 Base) MCG/ACT inhaler INHALE 2 PUFFS INTO THE LUNGS EVERY 4 HOURS AS NEEDED FOR WHEEZING OR SHORTNESS OF BREATH 6.7 g 3 10/09/2021  ? carbidopa-levodopa (SINEMET IR) 25-100 MG tablet Take 1 tablet by mouth 3 (three) times daily. Take 1 tablet three times a day   10/09/2021  ? Coenzyme Q10 (CO Q10) 200 MG CAPS Take 1 capsule by mouth daily.   10/08/2021 at 1500  ? DULoxetine (CYMBALTA) 30 MG capsule TAKE 1 CAPSULE(30 MG) BY MOUTH DAILY 30 capsule 2 10/09/2021  ? empagliflozin (JARDIANCE) 10 MG TABS tablet Take by mouth daily.   10/09/2021  ? esomeprazole (NEXIUM) 40 MG capsule TAKE 1 CAPSULE BY MOUTH EVERY DAY 90 capsule 1 10/09/2021  ? fluticasone (FLONASE) 50 MCG/ACT nasal spray Place 2 sprays into both nostrils daily. 16 g 6 10/09/2021  ? furosemide (LASIX) 40 MG tablet Take 1 tablet (40 mg total) by mouth daily. 30 tablet 2 10/09/2021  ? gabapentin (NEURONTIN) 600 MG tablet TAKE 1 TABLET(600 MG) BY MOUTH THREE TIMES DAILY 270 tablet 3 10/09/2021  ? glimepiride (AMARYL) 4 MG tablet TAKE 1 TABLET(4 MG) BY MOUTH DAILY BEFORE BREAKFAST 90 tablet 1 10/09/2021  ? hydroxypropyl methylcellulose (ISOPTO TEARS) 2.5 % ophthalmic solution  Place 1 drop into both eyes 3 (three) times daily as needed. Dry eyes    10/08/2021  ? MAGNESIUM-OXIDE 400 (241.3 Mg) MG tablet TAKE 1 TABLET BY MOUTH TWICE DAILY 60 tablet 11 10/09/2021  ? metoprolol succinate (TOPROL-XL) 25 MG 24 hr tablet Take 0.5 tablets (12.5 mg total) by mouth daily. 15 tablet 2 10/09/2021  ? MULTIPLE VITAMIN PO Take 1 tablet by mouth daily.   10/09/2021  ? pregabalin (LYRICA) 50 MG capsule Take 50 mg by mouth 2 (two) times daily.   10/09/2021  ? Probiotic Product (ALIGN PO) Take 1 capsule by mouth daily.   10/09/2021  ? rOPINIRole (REQUIP) 0.5 MG tablet Take 0.5  mg by mouth 3 (three) times daily.   10/09/2021  ? Accu-Chek FastClix Lancets MISC Just check first thing in the morning to ensure no low blood sugar. 102 each 1   ? blood glucose meter kit and supplies KIT Just check first thing in the morning to ensure no low blood sugar. 1 each 0   ? Blood Glucose Monitoring Suppl (ACCU-CHEK GUIDE) w/Device KIT Check blood sugars daily as directed 1 kit 0   ? DULoxetine (CYMBALTA) 20 MG capsule Take 40 mg by mouth daily. (Patient not taking: Reported on 10/09/2021)   Not Taking  ? glucose blood (ACCU-CHEK GUIDE) test strip Just check first thing in the morning to ensure no low blood sugar. 100 strip 1   ? mupirocin ointment (BACTROBAN) 2 % Apply topically 2 (two) times daily.     ? tamsulosin (FLOMAX) 0.4 MG CAPS capsule TAKE 1 CAPSULE(0.4 MG) BY MOUTH DAILY (Patient not taking: Reported on 10/09/2021) 90 capsule 0 Not Taking  ? ? ?Social History  ? ?Socioeconomic History  ? Marital status: Widowed  ?  Spouse name: Not on file  ? Number of children: 1  ? Years of education: Not on file  ? Highest education level: Bachelor's degree (e.g., BA, AB, BS)  ?Occupational History  ? Occupation: retired  ?Tobacco Use  ? Smoking status: Never  ? Smokeless tobacco: Never  ?Substance and Sexual Activity  ? Alcohol use: No  ? Drug use: No  ? Sexual activity: Never  ?Other Topics Concern  ? Not on file  ?Social History Narrative  ? Not on file  ? ?Social Determinants of Health  ? ?Financial Resource Strain: Not on file  ?Food Insecurity: Not on file  ?Transportation Needs: Not on file  ?Physical Activity: Not on file  ?Stress: Not on file  ?Social Connections: Not on file  ?Intimate Partner Violence: Not on file  ?  ?Family History  ?Problem Relation Age of Onset  ? Heart failure Father   ? Heart disease Father   ? Hypertension Mother   ? Diabetes Mother   ? Heart disease Mother   ? Cancer Sister   ?     breast  ? Breast cancer Sister 3  ? Cancer Maternal Grandfather   ? Cancer Maternal Aunt   ?      x3  ? Breast cancer Daughter 55  ?  ? ? ?Review of systems complete and found to be negative unless listed above  ? ? ? ? ?PHYSICAL EXAM ? ?General: Elderly frail appearing female sleeping with mouth open ?HEENT:  Normocephalic and atramatic. [place] [employer] oxygen in place ?Neck:  No JVD.  ?Lungs: Coarse breath sounds to ascultation anteriorly.  Normal respiratory effort on O2 by . Wet cough. ?Heart: HRRR . Normal S1 and S2 without gallops or  murmurs.  ?Abdomen: nondistended appearing ?Msk:  Normal strength and tone for age. ?Extremities: No clubbing, cyanosis or edema.   ?Neuro: Moves all extremities ?Psych:  Pleasantly confused ? ?Labs: ?  ?Lab Results  ?Component Value Date  ? WBC 10.2 10/13/2021  ? HGB 12.4 10/13/2021  ? HCT 36.9 10/13/2021  ? MCV 113.5 (H) 10/13/2021  ? PLT 289 10/13/2021  ?  ?Recent Labs  ?Lab 10/09/21 ?1345 10/10/21 ?0416 10/13/21 ?0086  ?NA 137   < > 139  ?K 4.1   < > 4.3  ?CL 97*   < > 102  ?CO2 32   < > 27  ?BUN 29*   < > 71*  ?CREATININE 1.69*   < > 2.52*  ?CALCIUM 8.4*   < > 8.6*  ?PROT 6.8  --   --   ?BILITOT 0.9  --   --   ?ALKPHOS 69  --   --   ?ALT 7  --   --   ?AST 28  --   --   ?GLUCOSE 176*   < > 184*  ? < > = values in this interval not displayed.  ? ? ?No results found for: CKTOTAL, CKMB, CKMBINDEX, TROPONINI  ?Lab Results  ?Component Value Date  ? CHOL 149 08/26/2017  ? CHOL 142 02/27/2016  ? CHOL 149 01/15/2015  ? ?Lab Results  ?Component Value Date  ? HDL 47 08/26/2017  ? HDL 64 02/27/2016  ? HDL 51 01/15/2015  ? ?Lab Results  ?Component Value Date  ? [place] 74 08/26/2017  ? [place] 54 02/27/2016  ? [place] 67 01/15/2015  ? ?Lab Results  ?Component Value Date  ? TRIG 141 08/26/2017  ? TRIG 120 02/27/2016  ? TRIG 155 (H) 01/15/2015  ? ?Lab Results  ?Component Value Date  ? CHOLHDL 3.2 08/26/2017  ? ?No results found for: LDLDIRECT  ?  ?Radiology: US RENAL ? ?Result Date: 10/12/2021 ?CLINICAL DATA:  Acute kidney injury. EXAM: RENAL / URINARY TRACT ULTRASOUND COMPLETE COMPARISON:  CT  without contrast on 04/12/2021 and prior renal ultrasound on 02/04/2017 FINDINGS: Right Kidney: Renal measurements: 10 x 4.2 x 3.8 cm = volume: 83 mL. The right kidney demonstrates cortical atrophy and increased

## 2021-10-14 DIAGNOSIS — Z7189 Other specified counseling: Secondary | ICD-10-CM | POA: Diagnosis not present

## 2021-10-14 DIAGNOSIS — Z515 Encounter for palliative care: Secondary | ICD-10-CM | POA: Diagnosis not present

## 2021-10-14 DIAGNOSIS — A419 Sepsis, unspecified organism: Secondary | ICD-10-CM | POA: Diagnosis not present

## 2021-10-14 DIAGNOSIS — I509 Heart failure, unspecified: Secondary | ICD-10-CM | POA: Diagnosis not present

## 2021-10-14 LAB — CBC WITH DIFFERENTIAL/PLATELET
Abs Immature Granulocytes: 0.68 10*3/uL — ABNORMAL HIGH (ref 0.00–0.07)
Basophils Absolute: 0 10*3/uL (ref 0.0–0.1)
Basophils Relative: 0 %
Eosinophils Absolute: 0 10*3/uL (ref 0.0–0.5)
Eosinophils Relative: 0 %
HCT: 37.5 % (ref 36.0–46.0)
Hemoglobin: 12.9 g/dL (ref 12.0–15.0)
Immature Granulocytes: 7 %
Lymphocytes Relative: 20 %
Lymphs Abs: 2.1 10*3/uL (ref 0.7–4.0)
MCH: 38.7 pg — ABNORMAL HIGH (ref 26.0–34.0)
MCHC: 34.4 g/dL (ref 30.0–36.0)
MCV: 112.6 fL — ABNORMAL HIGH (ref 80.0–100.0)
Monocytes Absolute: 1 10*3/uL (ref 0.1–1.0)
Monocytes Relative: 10 %
Neutro Abs: 6.6 10*3/uL (ref 1.7–7.7)
Neutrophils Relative %: 63 %
Platelets: 289 10*3/uL (ref 150–400)
RBC: 3.33 MIL/uL — ABNORMAL LOW (ref 3.87–5.11)
RDW: 18.8 % — ABNORMAL HIGH (ref 11.5–15.5)
Smear Review: NORMAL
WBC: 10.4 10*3/uL (ref 4.0–10.5)
nRBC: 0.5 % — ABNORMAL HIGH (ref 0.0–0.2)

## 2021-10-14 LAB — CULTURE, BLOOD (ROUTINE X 2)
Culture: NO GROWTH
Culture: NO GROWTH
Special Requests: ADEQUATE
Special Requests: ADEQUATE

## 2021-10-14 LAB — GLUCOSE, CAPILLARY
Glucose-Capillary: 183 mg/dL — ABNORMAL HIGH (ref 70–99)
Glucose-Capillary: 153 mg/dL — ABNORMAL HIGH (ref 70–99)
Glucose-Capillary: 163 mg/dL — ABNORMAL HIGH (ref 70–99)
Glucose-Capillary: 128 mg/dL — ABNORMAL HIGH (ref 70–99)

## 2021-10-14 LAB — BASIC METABOLIC PANEL
Anion gap: 9 (ref 5–15)
BUN: 72 mg/dL — ABNORMAL HIGH (ref 8–23)
CO2: 27 mmol/L (ref 22–32)
Calcium: 8.6 mg/dL — ABNORMAL LOW (ref 8.9–10.3)
Chloride: 103 mmol/L (ref 98–111)
Creatinine, Ser: 2.3 mg/dL — ABNORMAL HIGH (ref 0.44–1.00)
GFR, Estimated: 19 mL/min — ABNORMAL LOW (ref >60)
Glucose, Bld: 168 mg/dL — ABNORMAL HIGH (ref 70–99)
Potassium: 4.5 mmol/L (ref 3.5–5.1)
Sodium: 139 mmol/L (ref 135–145)

## 2021-10-14 LAB — PTH, INTACT AND CALCIUM
Calcium, Total (PTH): 8.6 mg/dL — ABNORMAL LOW (ref 8.7–10.3)
PTH: 40 pg/mL (ref 15–65)

## 2021-10-14 MED ORDER — PANTOPRAZOLE 2 MG/ML SUSPENSION
40.0000 mg | Freq: Every day | ORAL | Status: DC
  Administered 2021-10-15 – 2021-10-17 (×3): 40 mg via ORAL
  Filled 2021-10-14 (×5): qty 20

## 2021-10-14 MED ORDER — METOPROLOL TARTRATE 25 MG PO TABS
12.5000 mg | ORAL_TABLET | Freq: Two times a day (BID) | ORAL | Status: DC
Start: 2021-10-14 — End: 2021-10-19
  Administered 2021-10-14 – 2021-10-19 (×10): 12.5 mg via ORAL
  Filled 2021-10-14 (×10): qty 1

## 2021-10-14 MED ORDER — METOPROLOL SUCCINATE ER 25 MG PO TB24: 25.0000 mg | ORAL_TABLET | Freq: Every day | ORAL | Status: DC

## 2021-10-14 MED ORDER — HEPARIN SODIUM (PORCINE) 5000 UNIT/ML IJ SOLN
5000.0000 [IU] | Freq: Three times a day (TID) | INTRAMUSCULAR | Status: DC
  Administered 2021-10-14 – 2021-10-19 (×14): 5000 [IU] via SUBCUTANEOUS
  Filled 2021-10-14 (×14): qty 1

## 2021-10-14 MED ORDER — GABAPENTIN 250 MG/5ML PO SOLN
200.0000 mg | Freq: Three times a day (TID) | ORAL | Status: DC
  Administered 2021-10-14 – 2021-10-19 (×14): 200 mg via ORAL
  Filled 2021-10-14 (×17): qty 4

## 2021-10-14 NOTE — Progress Notes (Addendum)
Renee Carpenter  MRN: [id number]  DOB/AGE: 1925-11-09 88 y.o.  Primary Care Physician:Gilbert, Leonette Monarch., MD  Admit date: 10/09/2021  Chief Complaint:  Chief Complaint  Patient presents with   Shortness of Breath   Fever    S-Pt presented on  10/09/2021 with  Chief Complaint  Patient presents with   Shortness of Breath   Fever  .   Patient is a 88 year old Caucasian female with a past medical history of chronic systolic CHF with ejection fraction of around 25 to 30%, CKD stage IIIb, diabetes mellitus type 2, Parkinson's, obstructive sleep apnea, hypertension who was brought to the ER with chief complaint of shortness of breath.  Patient was laying uncomfortably on the bed.  Medications  carbidopa-levodopa  1 tablet Oral TID   Chlorhexidine Gluconate Cloth  6 each Topical Daily   DULoxetine  30 mg Oral Daily   gabapentin  200 mg Oral Q8H   heparin injection (subcutaneous)  5,000 Units Subcutaneous Q8H   insulin aspart  0-15 Units Subcutaneous TID WC   insulin aspart  0-5 Units Subcutaneous QHS   metoprolol tartrate  12.5 mg Oral BID   [START ON 10/15/2021] pantoprazole sodium  40 mg Oral Daily   rOPINIRole  0.5 mg Oral TID   sodium chloride flush  3 mL Intravenous Q12H         ROS: Confused unable to offer but history  Physical Exam: Vital signs in last 24 hours: Temp:  [97.5 F (36.4 C)-98 F (36.7 C)] 98 F (36.7 C) (05/10 1110) Pulse Rate:  [71-97] 73 (05/10 1925) Resp:  [16-19] 19 (05/10 1925) BP: (117-149)/(64-81) 117/64 (05/10 1925) SpO2:  [93 %-98 %] 98 % (05/10 1925) Weight:  [68 kg] 68 kg (05/10 0946) Weight change:  Last BM Date : 10/14/21  Intake/Output from previous day: 05/09 0701 - 05/10 0700 In: 120 [P.O.:120] Out: 1100 [Urine:1100] No intake/output data recorded.   Physical Exam:  General- pt is confused does occasionally follows commands Resp- No acute REsp distress,  Rhonchi+, Decreased breath sounds at bases CVS- S1S2 regular  in rate and rhythm GIT- BS+, soft, NT, ND EXT- NO LE Edema, Cyanosis CNS- CN 2-12 grossly intact. Moving all 4 extremities  Lab Results:  CBC  Recent Labs    10/13/21 0752 10/14/21 0832  WBC 10.2 10.4  HGB 12.4 12.9  HCT 36.9 37.5  PLT 289 289    BMET  Recent Labs    10/13/21 0752 10/14/21 0832  NA 139 139  K 4.3 4.5  CL 102 103  CO2 27 27  GLUCOSE 184* 168*  BUN 71* 72*  CREATININE 2.52* 2.30*  CALCIUM 8.6*  8.6* 8.6*      Most recent Creatinine trend  Lab Results  Component Value Date   CREATININE 2.30 (H) 10/14/2021   CREATININE 2.52 (H) 10/13/2021   CREATININE 2.46 (H) 10/12/2021      MICRO   Recent Results (from the past 240 hour(s))  Resp Panel by RT-PCR (Flu A&B, Covid) Nasopharyngeal Swab     Status: None   Collection Time: 10/09/21  1:45 PM   Specimen: Nasopharyngeal Swab; Nasopharyngeal(NP) swabs in vial transport medium  Result Value Ref Range Status   SARS Coronavirus 2 by RT PCR NEGATIVE NEGATIVE Final    Comment: (NOTE) SARS-CoV-2 target nucleic acids are NOT DETECTED.  The SARS-CoV-2 RNA is generally detectable in upper respiratory specimens during the acute phase of infection. The lowest concentration of SARS-CoV-2 viral copies this assay  can detect is 138 copies/mL. A negative result does not preclude SARS-Cov-2 infection and should not be used as the sole basis for treatment or other patient management decisions. A negative result may occur with  improper specimen collection/handling, submission of specimen other than nasopharyngeal swab, presence of viral mutation(s) within the areas targeted by this assay, and inadequate number of viral copies(<138 copies/mL). A negative result must be combined with clinical observations, patient history, and epidemiological information. The expected result is Negative.  Fact Sheet for Patients:  BloggerCourse.com  Fact Sheet for Healthcare Providers:   SeriousBroker.it  This test is no t yet approved or cleared by the [place] FDA and  has been authorized for detection and/or diagnosis of SARS-CoV-2 by FDA under an Emergency Use Authorization (EUA). This EUA will remain  in effect (meaning this test can be used) for the duration of the COVID-19 declaration under Section 564(b)(1) of the Act, 21 U.S.C.section 360bbb-3(b)(1), unless the authorization is terminated  or revoked sooner.       Influenza A by PCR NEGATIVE NEGATIVE Final   Influenza B by PCR NEGATIVE NEGATIVE Final    Comment: (NOTE) The Xpert Xpress SARS-CoV-2/FLU/RSV plus assay is intended as an aid in the diagnosis of influenza from Nasopharyngeal swab specimens and should not be used as a sole basis for treatment. Nasal washings and aspirates are unacceptable for Xpert Xpress SARS-CoV-2/FLU/RSV testing.  Fact Sheet for Patients: BloggerCourse.com  Fact Sheet for Healthcare Providers: SeriousBroker.it  This test is not yet approved or cleared by the [place] FDA and has been authorized for detection and/or diagnosis of SARS-CoV-2 by FDA under an Emergency Use Authorization (EUA). This EUA will remain in effect (meaning this test can be used) for the duration of the COVID-19 declaration under Section 564(b)(1) of the Act, 21 U.S.C. section 360bbb-3(b)(1), unless the authorization is terminated or revoked.  Performed at [facility], [address] [place], [place] [address]   Blood Culture (routine x 2)     Status: None   Collection Time: 10/09/21  1:45 PM   Specimen: BLOOD RIGHT HAND  Result Value Ref Range Status   Specimen Description BLOOD RIGHT HAND  Final   Special Requests   Final    BOTTLES DRAWN AEROBIC AND ANAEROBIC Blood Culture adequate volume   Culture   Final    NO GROWTH 5 DAYS Performed at [facility], [address], [place],  [place] [address]    Report Status 10/14/2021 FINAL  Final  Blood Culture (routine x 2)     Status: None   Collection Time: 10/09/21  1:45 PM   Specimen: BLOOD  Result Value Ref Range Status   Specimen Description BLOOD LEFT ANTECUBITAL  Final   Special Requests   Final    BOTTLES DRAWN AEROBIC AND ANAEROBIC Blood Culture adequate volume   Culture   Final    NO GROWTH 5 DAYS Performed at [facility], [address] [place], [place] [address]    Report Status 10/14/2021 FINAL  Final  MRSA Next Gen by PCR, Nasal     Status: None   Collection Time: 10/10/21  9:21 AM   Specimen: Nasal Mucosa; Nasal Swab  Result Value Ref Range Status   MRSA by PCR Next Gen NOT DETECTED NOT DETECTED Final    Comment: (NOTE) The GeneXpert MRSA Assay (FDA approved for NASAL specimens only), is one component of a comprehensive MRSA colonization surveillance program. It is not intended to diagnose MRSA  infection nor to guide or monitor treatment for MRSA infections. Test performance is not FDA approved in patients less than 31 years old. Performed at [facility], [address], [place], [place] [address]      Renal ultrasound done on August 12, 2021 was reviewed FINDINGS: Right Kidney:   Renal measurements: 10 x 4.2 x 3.8 cm = volume: 83 mL. The right kidney demonstrates cortical atrophy and increased cortical echogenicity. No hydronephrosis or focal lesion identified.   Left Kidney:   Renal measurements: 9.2 x 4.4 x 3.5 cm = volume: 75 mL. The left kidney demonstrates increased cortical echogenicity. No hydronephrosis or focal lesion identified.   Bladder:   The bladder is decompressed by a Foley catheter.   Other:   None.   IMPRESSION: Increased cortical echogenicity of both kidneys is consistent with underlying chronic kidney disease. No hydronephrosis.   Impression:  1)Renal   Acute kidney injury AKI secondary to ATN AKI secondary to multiple factors AKI  secondary to UTI/sepsis AKI secondary to hypotension as patient has SBP of less than 80 recently AKI secondary to medications as patient was recently started on Jardiance AKI secondary to cardiorenal syndrome   Patient has AKI on CKD Patient has CKD stage IIIb Patient has CKD age associated decline As patient is 88 years old Patient has CKD since 2016 Patient has CKD secondary to diabetes mellitus     AKI is stable Patient creatinine is at a plateau We will follow the trend Will follow closely    Creatinine trend 2023 1.4==>2.5==2.3-AKI 2022 1.4--1.6 2021  1.2--1.3 2020  1.6 2019   1.3 2018   0.9--1.1 2017  1.2--1.4 2016  1.5       2)Hypotension Patient blood pressure was low earlier Patient blood pressure is now better   3)Anemia of chronic disease     Latest Ref Rng & Units 10/14/2021    8:32 AM 10/13/2021    7:52 AM 10/12/2021    3:26 AM  CBC  WBC 4.0 - 10.5 K/uL 10.4   10.2   10.5    Hemoglobin 12.0 - 15.0 g/dL 19.1   47.8   29.5    Hematocrit 36.0 - 46.0 % 37.5   36.9   34.9    Platelets 150 - 400 K/uL 289   289   234      Patient hemoglobin was earlier at 11.3, now better     4) Secondary hyperparathyroidism -CKD Mineral-Bone Disorder    Lab Results  Component Value Date   PTH 40 10/13/2021   PTH Comment 10/13/2021   CALCIUM 8.6 (L) 10/14/2021   PHOS 3.5 10/13/2021    Secondary Hyperparathyroidism  absent  Phosphorus at goal.  5)Acute on chronic systolic CHF  Patient has no edema Patient chest x-ray done today shows small bilateral pleural effusion Does not show much fluid overload We will hold off on giving any diuretics for now   6) Electrolytes      Latest Ref Rng & Units 10/14/2021    8:32 AM 10/13/2021    7:52 AM 10/12/2021    3:26 AM  BMP  Glucose 70 - 99 mg/dL 621   308   657    BUN 8 - 23 mg/dL 72   71   58    Creatinine 0.44 - 1.00 mg/dL 8.46   9.62   9.52    Sodium 135 - 145 mmol/L 139   139   138    Potassium 3.5 -  5.1  mmol/L 4.5   4.3   4.0    Chloride 98 - 111 mmol/L 103   102   101    CO2 22 - 32 mmol/L 27   27   28     Calcium 8.9 - 10.3 mg/dL 8.6   8.6     8.6   8.3       Sodium Normonatremic   Potassium Normokalemic    7)Acid base    Co2 at goal   8) sepsis/UTI Patient is on antibiotics Patient being followed the primary team Patient improving  9) Acute respiratory failure Patient is improving  Plan:  Patient creatinine is at plateau. We will hold off on starting diuretics Will most likely asked for chest x-ray in the morning to help assess the volume status We will continue the current treatment      Makynlie Rossini s [facility] 10/14/2021, 7:58 PM

## 2021-10-14 NOTE — Progress Notes (Signed)
PT Cancellation Note ? ?Patient Details ?Name: Renee Carpenter ?MRN: [id number] ?DOB: [birth year] ? ? ?Cancelled Treatment:     PT attempt, pt currently getting a bath from caregiver. PT will continue to follow and progress as able per current POC. Will return tomorrow.  ? ? ?Rushie Chestnut ?10/14/2021, 3:29 PM ?

## 2021-10-14 NOTE — Progress Notes (Signed)
Clinical/Bedside Swallow Evaluation ?Patient Details  ?Name: Renee Carpenter ?MRN: [id number] ?Date of Birth: Oct 17, 1925 ? ?Today's Date: 10/14/2021 ?Time: SLP Start Time (ACUTE ONLY): 0920 SLP Stop Time (ACUTE ONLY): 0950 ?SLP Time Calculation (min) (ACUTE ONLY): 30 min ? ?Past Medical History:  ?Past Medical History:  ?Diagnosis Date  ? Anemia   ? WAS ANEMIC  ? Arthritis   ? Blood transfusion   ? HAD 1 SEVERAL YRS AGO...3-4 YR  ? CHF (congestive heart failure) (HCC)   ? Chronic kidney disease   ? SOME DECREASE IN KIDNEY FUNCTION  ? Diabetes mellitus without complication (HCC)   ? Hypertension   ? Normal cardiac stress test   ? REQUESTING COPY FROM [facility]  ? Normal echocardiogram   ? REQUESTING THAT RESULT -- DR Park Breed   ? PONV (postoperative nausea and vomiting)   ? Shortness of breath   ? Sleep apnea   ? 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 ?Past Surgical History:  ?Past Surgical History:  ?Procedure Laterality Date  ? ABDOMINAL HYSTERECTOMY    ? BACK SURGERY    ? 2 CERVICAL SURGERIES ALSO  ? CATARACTS    ? RIGHT EYE CATARACT  ? CERVICAL DISCECTOMY  08/1995 and 2011  ? C4-,C5-6, C6-7 anterior cervical; Dr. Danielle Dess fusion and plating  ? LAMINECTOMY    ? decompressive; Dr. Polo Riley in [facility]  ? LUMBAR LAMINECTOMY/DECOMPRESSION MICRODISCECTOMY  04/14/2011  ? Procedure: LUMBAR LAMINECTOMY/DECOMPRESSION MICRODISCECTOMY;  Surgeon: Cristi Loron;  Location: [facility];  Service: Neurosurgery;  Laterality: N/A;  Lumbar Three and Lumbar Four Laminectomy  ? TONSILLECTOMY AND ADENOIDECTOMY  1939  ? ?HPI:  ?88 y.o. female  with past medical history of CHF with a EF of 25 to 30%, CKD 3, DM 2, Parkinsonism, COPD, esophageal reflux, OSA, HTN, arthritis, admitted on 10/09/2021 with acute decompensated systolic heart failure/acute hypoxic respiratory failure, UTI with AKI. Hx ACDF C4-7 is also noted.  ?  ?Assessment / Plan / Recommendation  ?Clinical Impression ?  Patient presents with clinical signs of oropharyngeal dysphagia and  reports discomfort with swallowing liquids. She is awake, but drowsy. Follows simple commands however requires extended time for processing and to provide responses to questions. She requires physical assistance as well as verbal cues for self-feeding; reduced awareness of cup/straw placement with verbal cues necessary to initiate bolus retrieval. Appearance of delayed swallow initiation with thin liquids, with consistent, immediate throat clearing suggestive of reduced airway protection. Pt grimaces and reports discomfort. With texture modification (nectar thick liquids, puree/pudding and soft banana), pt reports increased comfort. No coughing or throat clearing; patient does initiate double swallow occasionally, suggestive of possible pharyngeal residue or may be attributable to prior history of esophageal dysphagia. Pt reported greater ease/comfort with swallowing nectar-thick liquids. Recommend downgrade to nectar thick liquids, with meats chopped/moistened. Anticipate she will need full supervision/assistance for feeding based on her presentation today. RN reports pt tolerating medications crushed in puree. Recommend allowing pt to have ice chips between meals after oral care for comfort/pleasure. Position fully upright during and for 30 minutes after meals. Pending goals of care discussion, may be beneficial to consider free water protocol or thin liquids between meals with focus on pt comfort. SLP to f/u for tolerance.  ? ?SLP Visit Diagnosis: Dysphagia, oropharyngeal phase (R13.12) ?   ?Aspiration Risk ? Mild aspiration risk;Moderate aspiration risk  ?  ?Diet Recommendation Regular (meats chopped/moistened);Nectar-thick liquid;Ice chips PRN after oral care (consider free water protocol/liberalize liquids to allow for pt choice for comfort  pending goals of care discussion)  ? ?Liquid Administration via: Cup;Straw ?Medication Administration: Crushed with puree ?Supervision: Staff to assist with self  feeding ?Compensations: Slow rate;Small sips/bites ?Postural Changes: Seated upright at 90 degrees;Remain upright for at least 30 minutes after po intake  ?  ?Other  Recommendations Oral Care Recommendations: Oral care BID;Oral care prior to ice chip/H20 ?Other Recommendations: Order thickener from pharmacy   ? ?Recommendations for follow up therapy are one component of a multi-disciplinary discharge planning process, led by the attending physician.  Recommendations may be updated based on patient status, additional functional criteria and insurance authorization. ? ?Follow up Recommendations Other (comment) (tbd)  ? ? ?  ?Assistance Recommended at Discharge Frequent or constant Supervision/Assistance  ?Functional Status Assessment Patient has had a recent decline in their functional status and/or demonstrates limited ability to make significant improvements in function in a reasonable and predictable amount of time  ?Frequency and Duration min 2x/week  ?2 weeks ?  ?   ? ?Prognosis Prognosis for Safe Diet Advancement: Fair ?Barriers to Reach Goals: Cognitive deficits  ? ?  ? ?Swallow Study   ?General Date of Onset: 10/09/21 ?HPI: 88 y.o. female  with past medical history of CHF with a EF of 25 to 30%, CKD 3, DM 2, Parkinsonism, COPD, esophageal reflux, OSA, HTN, arthritis, admitted on 10/09/2021 with acute decompensated systolic heart failure/acute hypoxic respiratory failure, UTI with AKI. ?Type of Study: Bedside Swallow Evaluation ?Previous Swallow Assessment: none on file; remote hx barium swallow (2002, 2005) and esophageal dilation ?Diet Prior to this Study: Regular;Thin liquids ?Temperature Spikes Noted: No ?Respiratory Status: Nasal cannula (2L) ?History of Recent Intubation: No ?Behavior/Cognition: Lethargic/Drowsy;Cooperative ?Oral Cavity Assessment: Within Functional Limits;Dry ?Oral Care Completed by SLP: No ?Vision: Impaired for self-feeding ?Self-Feeding Abilities: Needs assist ?Patient Positioning:  Upright in bed ?Baseline Vocal Quality: Low vocal intensity ?Volitional Cough: Strong ?Volitional Swallow: Able to elicit  ?  ?Oral/Motor/Sensory Function Overall Oral Motor/Sensory Function: Generalized oral weakness ?Facial ROM: Reduced right;Reduced left ?Facial Symmetry: Within Functional Limits ?Lingual ROM: Reduced right;Reduced left ?Lingual Symmetry: Within Functional Limits ?Lingual Strength: Reduced   ?Ice Chips Ice chips: Within functional limits   ?Thin Liquid Thin Liquid: Impaired ?Presentation: Cup;Straw;Spoon ?Oral Phase Impairments: Reduced labial seal;Poor awareness of bolus;Reduced lingual movement/coordination ?Pharyngeal  Phase Impairments: Suspected delayed Swallow;Multiple swallows;Throat Clearing - Immediate  ?  ?Nectar Thick Nectar Thick Liquid: Impaired ?Presentation: Cup;Straw ?Oral Phase Impairments: Reduced labial seal;Poor awareness of bolus ?Pharyngeal Phase Impairments: Multiple swallows   ?Honey Thick Honey Thick Liquid: Not tested   ?Puree Puree: Within functional limits   ?Solid ? ? ? Rondel Baton, MS, CCC-SLP ?Speech-Language Pathologist ?[phone] ? Solid: Within functional limits  ? ?  ? ?Renee Carpenter ?10/14/2021,10:17 AM ? ? ? ? ?

## 2021-10-14 NOTE — Progress Notes (Signed)
Palliative: ?Renee Carpenter is sitting up in bed.  She is sleeping soundly with her mouth open.  She wakes easily when I call her name, making and somewhat keeping eye contact.  She appears acutely/chronically ill and very frail.  I do not ask orientation questions.  I am not sure that she can make her basic needs known.  There is no family/friend support at bedside. ? ?I ask Renee Carpenter how she is doing.  She looks at me and states, "eh, sort of dead".  I tried to reassure her that she is being cared for.  Offer something to drink and she is able to take a few sips of water without overt signs and symptoms of aspiration.  It remains to be seen whether she is able to have meaningful improvements. ? ?Conference with attending, bedside nursing staff, transition of care team related to patient condition, needs, goals of care, disposition. ? ?Plan:    At this point continue to treat the treatable but no CPR or intubation.  Time for outcomes.  [facility] [place].  Anticipate returning there with short-term rehab versus palliative/hospice care based on outcomes. ? ?25 minutes  ?Lillia Carmel, NP ?Palliative medicine team ?Team phone [phone] ?Greater than 50% of this time spent counseling and coordinating care related to the above assessment and plan. ? ?

## 2021-10-14 NOTE — Progress Notes (Signed)
?PROGRESS NOTE ? ? ? ?Renee Carpenter  [id number] DOB: 03-Dec-1925 DOA: 10/09/2021 ?PCP: Jerrol Banana., MD  ? ? ?Brief Narrative:  ?88 y.o. female with medical history significant of systolic congestive heart failure EF 25 to 30%, CKD stage IIIa, type 2 diabetes mellitus, parkinsonism, obstructive sleep apnea, hypertension presents for evaluation of progressively worsening shortness of breath associated with fatigue and altered mentation.  [name] is compliance with all medications.  No abdominal pain ?  ?No home oxygen requirement.  On presentation brought in by EMS on 4 L nasal cannula after being found hypoxic at home.  Patient is a poor historian.  History obtained from multiple sources including the patient, patient's husband, ER physician, ER documentation. ?  ?On my evaluation patient is uncomfortable appearing.  Difficult to obtain history from.  Per husband at bedside patient is complaining of lower abdominal pain.  Urine drawn on admission is concerning for urinary tract infection although patient is afebrile with normal lactic acidosis and no leukocytosis.  Patient was febrile with Tmax 102.4.  Patient has lab work including elevated BNP and chest x-ray that demonstrates evidence of interstitial edema. ?  ?Alternates between agitated and lethargic.  Mental status remains poor.  No change in oxygen requirement.  Breakthrough fevers noted ?  ?Acutely hypotensive 5/6.  Midodrine, stress dose steroids, albumin administered.  Blood pressure responded.  On 5/7 much more awake.  Remains on 4 L O2. ?  ?5/9.  Shock physiology improved.  Midodrine weaned off with stabilization of blood pressure ?  ? ?5/10 creatinine down mildly.  ? ? ? ?Consultants:  ?Cardiology, nephrology ? ?Procedures:  ? ?Antimicrobials:  ?  ? ? ?Subjective: ?Eyes closed half way. On [place]. Denies sob. drawsy ? ?Objective: ?Vitals:  ? 10/14/21 0358 10/14/21 0746 10/14/21 0946 10/14/21 1110  ?BP: (!) 141/81 (!) 149/80  138/77   ?Pulse: 71 97  94  ?Resp: 18 17  18   ?Temp:  97.9 ?F (36.6 ?C)  98 ?F (36.7 ?C)  ?TempSrc:    Oral  ?SpO2: 95% 97%  96%  ?Weight:   68 kg   ?Height:      ? ? ?Intake/Output Summary (Last 24 hours) at 10/14/2021 1544 ?Last data filed at 10/14/2021 0930 ?Gross per 24 hour  ?Intake 240 ml  ?Output 950 ml  ?Net -710 ml  ? ?Filed Weights  ? 10/12/21 0402 10/13/21 0449 10/14/21 0946  ?Weight: 68.4 kg 70.2 kg 68 kg  ? ? ?Examination: ?Calm, drawsy ?Decrease bs with poor respiratory effort ?Reg s1/s2 no gallop ?Soft benign +bs ?No edema ?Mood and affect appropriate in current setting  ? ? ? ?Data Reviewed: I have personally reviewed following labs and imaging studies ? ?CBC: ?Recent Labs  ?Lab 10/10/21 ?[id number] 10/11/21 ?0404 10/12/21 ?[id number] 10/13/21 ?[id number] 10/14/21 ?[id number]  ?WBC 13.5* 12.6* 10.5 10.2 10.4  ?NEUTROABS 11.3* 11.0* 8.2* 7.0 6.6  ?HGB 13.1 11.3* 12.0 12.4 12.9  ?HCT 38.4 32.9* 34.9* 36.9 37.5  ?MCV 115.7* 114.2* 114.1* 113.5* 112.6*  ?PLT 192 179 234 289 289  ? ?Basic Metabolic Panel: ?Recent Labs  ?Lab 10/10/21 ?[id number] 10/11/21 ?0404 10/12/21 ?[id number] 10/13/21 ?[id number] 10/14/21 ?[id number]  ?NA 136 139 138 139 139  ?K 4.5 3.9 4.0 4.3 4.5  ?CL 94* 97* 101 102 103  ?CO2 30 28 28 27 27   ?GLUCOSE 192* 229* 205* 184* 168*  ?BUN 34* 48* 58* 71* 72*  ?CREATININE 2.03* 2.37* 2.46* 2.52* 2.30*  ?CALCIUM 8.3* 8.1* 8.3* 8.6*  8.6* 8.6*  ?PHOS  --   --   --  3.5  --   ? ?GFR: ?Estimated Creatinine Clearance: 13.8 mL/min (A) (by C-G formula based on SCr of 2.3 mg/dL (H)). ?Liver Function Tests: ?Recent Labs  ?Lab 10/09/21 ?1345  ?AST 28  ?ALT 7  ?ALKPHOS 69  ?BILITOT 0.9  ?PROT 6.8  ?ALBUMIN 3.4*  ? ?No results for input(s): LIPASE, AMYLASE in the last 168 hours. ?No results for input(s): AMMONIA in the last 168 hours. ?Coagulation Profile: ?No results for input(s): INR, PROTIME in the last 168 hours. ?Cardiac Enzymes: ?No results for input(s): CKTOTAL, CKMB, CKMBINDEX, TROPONINI in the last 168 hours. ?BNP (last 3 results) ?No results for  input(s): PROBNP in the last 8760 hours. ?HbA1C: ?No results for input(s): HGBA1C in the last 72 hours. ?CBG: ?Recent Labs  ?Lab 10/13/21 ?1628 10/13/21 ?1648 10/13/21 ?2040 10/14/21 ?0748 10/14/21 ?1131  ?GLUCAP 265* 267* 198* 183* 153*  ? ?Lipid Profile: ?No results for input(s): CHOL, HDL, LDLCALC, TRIG, CHOLHDL, LDLDIRECT in the last 72 hours. ?Thyroid Function Tests: ?No results for input(s): TSH, T4TOTAL, FREET4, T3FREE, THYROIDAB in the last 72 hours. ?Anemia Panel: ?No results for input(s): VITAMINB12, FOLATE, FERRITIN, TIBC, IRON, RETICCTPCT in the last 72 hours. ?Sepsis Labs: ?Recent Labs  ?Lab 10/09/21 ?1345 10/09/21 ?1542 10/09/21 ?1700 10/10/21 ?0416 10/10/21 ?1722  ?PROCALCITON  --   --  <0.10 0.11  --   ?LATICACIDVEN 1.5 1.2  --   --  1.5  ? ? ?Recent Results (from the past 240 hour(s))  ?Resp Panel by RT-PCR (Flu A&B, Covid) Nasopharyngeal Swab     Status: None  ? Collection Time: 10/09/21  1:45 PM  ? Specimen: Nasopharyngeal Swab; Nasopharyngeal(NP) swabs in vial transport medium  ?Result Value Ref Range Status  ? SARS Coronavirus 2 by RT PCR NEGATIVE NEGATIVE Final  ?  Comment: (NOTE) ?SARS-CoV-2 target nucleic acids are NOT DETECTED. ? ?The SARS-CoV-2 RNA is generally detectable in upper respiratory ?specimens during the acute phase of infection. The lowest ?concentration of SARS-CoV-2 viral copies this assay can detect is ?138 copies/mL. A negative result does not preclude SARS-Cov-2 ?infection and should not be used as the sole basis for treatment or ?other patient management decisions. A negative result may occur with  ?improper specimen collection/handling, submission of specimen other ?than nasopharyngeal swab, presence of viral mutation(s) within the ?areas targeted by this assay, and inadequate number of viral ?copies(<138 copies/mL). A negative result must be combined with ?clinical observations, patient history, and epidemiological ?information. The expected result is Negative. ? ?Fact  Sheet for Patients:  ?EntrepreneurPulse.com.au ? ?Fact Sheet for Healthcare Providers:  ?IncredibleEmployment.be ? ?This test is no t yet approved or cleared by the [place] FDA and  ?has been authorized for detection and/or diagnosis of SARS-CoV-2 by ?FDA under an Emergency Use Authorization (EUA). This EUA will remain  ?in effect (meaning this test can be used) for the duration of the ?COVID-19 declaration under Section 564(b)(1) of the Act, 21 ?U.S.C.section 360bbb-3(b)(1), unless the authorization is terminated  ?or revoked sooner.  ? ? ?  ? Influenza A by PCR NEGATIVE NEGATIVE Final  ? Influenza B by PCR NEGATIVE NEGATIVE Final  ?  Comment: (NOTE) ?The Xpert Xpress SARS-CoV-2/FLU/RSV plus assay is intended as an aid ?in the diagnosis of influenza from Nasopharyngeal swab specimens and ?should not be used as a sole basis for treatment. Nasal washings and ?aspirates are unacceptable for Xpert Xpress SARS-CoV-2/FLU/RSV ?testing. ? ?Fact Sheet for Patients: ?  EntrepreneurPulse.com.au ? ?Fact Sheet for Healthcare Providers: ?IncredibleEmployment.be ? ?This test is not yet approved or cleared by the [place] FDA and ?has been authorized for detection and/or diagnosis of SARS-CoV-2 by ?FDA under an Emergency Use Authorization (EUA). This EUA will remain ?in effect (meaning this test can be used) for the duration of the ?COVID-19 declaration under Section 564(b)(1) of the Act, 21 U.S.C. ?section 360bbb-3(b)(1), unless the authorization is terminated or ?revoked. ? ?Performed at [facility], [place], ?[place] [address] ?  ?Blood Culture (routine x 2)     Status: None  ? Collection Time: 10/09/21  1:45 PM  ? Specimen: BLOOD RIGHT HAND  ?Result Value Ref Range Status  ? Specimen Description BLOOD RIGHT HAND  Final  ? Special Requests   Final  ?  BOTTLES DRAWN AEROBIC AND ANAEROBIC Blood Culture adequate volume  ?  Culture   Final  ?  NO GROWTH 5 DAYS ?Performed at [facility], [address] [place], [place] [address] ?  ? Report Status 10/14/2021 FINAL  Final  ?Blood Culture (routine x 2)     Status: None  ? Col

## 2021-10-14 NOTE — TOC Progression Note (Signed)
Transition of Care (TOC) - Progression Note  ? ? ?Patient Details  ?Name: Renee Carpenter ?MRN: [id number] ?Date of Birth: [birth year] ? ?Transition of Care (TOC) CM/SW Contact  ?Laurena Slimmer, RN ?Phone Number: ?10/14/2021, 4:06 PM ? ?Clinical Narrative:    ?Spoke with patient at bedside. Patient's pastor and friend were visiting. Caregiver, contacted by phone but stated he could not speak at this time. Advised a decision was needed to plan for discharge. Patient was alert and oriented.  Patient  agreed to SNF placement at [facility].  ? ? ?  ?  ? ?Expected Discharge Plan and Services ?  ?  ?  ?  ?  ?                ?  ?  ?  ?  ?  ?  ?  ?  ?  ?  ? ? ?Social Determinants of Health (SDOH) Interventions ?  ? ?Readmission Risk Interventions ?   ? View : No data to display.  ?  ?  ?  ? ? ?

## 2021-10-14 NOTE — Progress Notes (Signed)
[facility] Cardiology ? ?CARDIOLOGY CONSULT NOTE  ?Patient ID: ?Nezperce ?MRN: [id number] ?DOB/AGE: 1925-09-15 88 y.o. ? ?Admit date: 10/09/2021 ?Referring Physician Ralene Muskrat ?Primary Physician Jerrol Banana., MD ?Primary Cardiologist Nehemiah Massed ?Reason for Consultation AoCHF ? ?HPI:  ?Renee Carpenter is a 88 year old female with history of CHF (EF25-30%), chronic left bundle branch block, CKD 3, hypertension, hyperlipidemia was admitted with shortness of breath, fatigue, altered mental status.  She was discovered to have sepsis from a urinary source.  Cardiology is consulted for further assistance. ? ?Interval history: ?- sitting upright in bed, sleeping with mouth open. No visitors at bedside. When asked how she's feeling she asks if it is morning, then closes her eyes again. Doesn't verbalize further.  ?-UOP slightly increased yesterday to ~922m, renal function slightly improving off diuretics  ?- BP improved today 1062Bsystolic ? ?Review of systems unable to assess due to patient's mental status ? ? ? ?Past Medical History:  ?Diagnosis Date  ? Anemia   ? WAS ANEMIC  ? Arthritis   ? Blood transfusion   ? HAD 1 SEVERAL YRS AGO...3-4 YR  ? CHF (congestive heart failure) ([place]   ? Chronic kidney disease   ? SOME DECREASE IN KIDNEY FUNCTION  ? Diabetes mellitus without complication ([place]   ? Hypertension   ? Normal cardiac stress test   ? [place] ? Normal echocardiogram   ? REQUESTING THAT RESULT -- DR KChancy Milroy  ? PONV (postoperative nausea and vomiting)   ? Shortness of breath   ? Sleep apnea   ? 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  ?Past Surgical History:  ?Procedure Laterality Date  ? ABDOMINAL HYSTERECTOMY    ? BACK SURGERY    ? 2 CERVICAL SURGERIES ALSO  ? CATARACTS    ? RIGHT EYE CATARACT  ? CERVICAL DISCECTOMY  08/1995 and 2011  ? C4-,C5-6, C6-7 anterior cervical; Dr. EEllene Routefusion and plating  ? LAMINECTOMY    ? decompressive; [place]. [place]  04/14[place]  ? Procedure: LUMBAR LAMINECTOMY/DECOMPRESSION MICRODISCECTOMY;  Surgeon: JOphelia Charter  [facility];  Service: Neurosurgery;  Laterality: N/A;  Lumbar Three and Lumbar Four Laminectomy  ? TONSILLECTOMY AND ADENOIDECTOMY  1939  ?  ?Medications Prior to Admission  ?Medication Sig Dispense Refill Last Dose  ? albuterol (VENTOLIN HFA) 108 (90 Base) MCG/ACT inhaler INHALE 2 PUFFS INTO THE LUNGS EVERY 4 HOURS AS NEEDED FOR WHEEZING OR SHORTNESS OF BREATH 6.7 g 3 10/09/2021  ? carbidopa-levodopa (SINEMET IR) 25-100 MG tablet Take 1 tablet by mouth 3 (three) times daily. Take 1 tablet three times a day   10/09/2021  ? Coenzyme Q10 (CO Q10) 200 MG CAPS Take 1 capsule by mouth daily.   10/08/2021 at 1500  ? DULoxetine (CYMBALTA) 30 MG capsule TAKE 1 CAPSULE(30 MG) BY MOUTH DAILY 30 capsule 2 10/09/2021  ? empagliflozin (JARDIANCE) 10 MG TABS tablet Take by mouth daily.   10/09/2021  ? esomeprazole (NEXIUM) 40 MG capsule TAKE 1 CAPSULE BY MOUTH EVERY DAY 90 capsule 1 10/09/2021  ? fluticasone (FLONASE) 50 MCG/ACT nasal spray Place 2 sprays into both nostrils daily. 16 g 6 10/09/2021  ? furosemide (LASIX) 40 MG tablet Take 1 tablet (40 mg total) by mouth daily. 30 tablet 2 10/09/2021  ? gabapentin (NEURONTIN) 600 MG tablet TAKE 1 TABLET(600 MG) BY MOUTH THREE TIMES DAILY 270 tablet 3 10/09/2021  ? glimepiride (AMARYL) 4 MG tablet TAKE 1  TABLET(4 MG) BY MOUTH DAILY BEFORE BREAKFAST 90 tablet 1 10/09/2021  ? hydroxypropyl methylcellulose (ISOPTO TEARS) 2.5 % ophthalmic solution Place 1 drop into both eyes 3 (three) times daily as needed. Dry eyes    10/08/2021  ? MAGNESIUM-OXIDE 400 (241.3 Mg) MG tablet TAKE 1 TABLET BY MOUTH TWICE DAILY 60 tablet 11 10/09/2021  ? metoprolol succinate (TOPROL-XL) 25 MG 24 hr tablet Take 0.5 tablets (12.5 mg total) by mouth daily. 15 tablet 2 10/09/2021  ? MULTIPLE VITAMIN PO Take 1 tablet by mouth daily.   10/09/2021  ? pregabalin (LYRICA) 50 MG capsule  Take 50 mg by mouth 2 (two) times daily.   10/09/2021  ? Probiotic Product (ALIGN PO) Take 1 capsule by mouth daily.   10/09/2021  ? rOPINIRole (REQUIP) 0.5 MG tablet Take 0.5 mg by mouth 3 (three) times daily.   10/09/2021  ? Accu-Chek FastClix Lancets MISC Just check first thing in the morning to ensure no low blood sugar. 102 each 1   ? blood glucose meter kit and supplies KIT Just check first thing in the morning to ensure no low blood sugar. 1 each 0   ? Blood Glucose Monitoring Suppl (ACCU-CHEK GUIDE) w/Device KIT Check blood sugars daily as directed 1 kit 0   ? DULoxetine (CYMBALTA) 20 MG capsule Take 40 mg by mouth daily. (Patient not taking: Reported on 10/09/2021)   Not Taking  ? glucose blood (ACCU-CHEK GUIDE) test strip Just check first thing in the morning to ensure no low blood sugar. 100 strip 1   ? mupirocin ointment (BACTROBAN) 2 % Apply topically 2 (two) times daily.     ? tamsulosin (FLOMAX) 0.4 MG CAPS capsule TAKE 1 CAPSULE(0.4 MG) BY MOUTH DAILY (Patient not taking: Reported on 10/09/2021) 90 capsule 0 Not Taking  ? ? ?Social History  ? ?Socioeconomic History  ? Marital status: Widowed  ?  Spouse name: Not on file  ? Number of children: 1  ? Years of education: Not on file  ? Highest education level: Bachelor's degree (e.g., BA, AB, BS)  ?Occupational History  ? Occupation: retired  ?Tobacco Use  ? Smoking status: Never  ? Smokeless tobacco: Never  ?Substance and Sexual Activity  ? Alcohol use: No  ? Drug use: No  ? Sexual activity: Never  ?Other Topics Concern  ? Not on file  ?Social History Narrative  ? Not on file  ? ?Social Determinants of Health  ? ?Financial Resource Strain: Not on file  ?Food Insecurity: Not on file  ?Transportation Needs: Not on file  ?Physical Activity: Not on file  ?Stress: Not on file  ?Social Connections: Not on file  ?Intimate Partner Violence: Not on file  ?  ?Family History  ?Problem Relation Age of Onset  ? Heart failure Father   ? Heart disease Father   ? Hypertension  Mother   ? Diabetes Mother   ? Heart disease Mother   ? Cancer Sister   ?     breast  ? Breast cancer Sister 28  ? Cancer Maternal Grandfather   ? Cancer Maternal Aunt   ?     x3  ? Breast cancer Daughter 34  ?  ? ? ?Review of systems complete and found to be negative unless listed above  ? ? ? ? ?PHYSICAL EXAM ? ?General: Elderly frail appearing female sleeping with mouth open ?HEENT:  Normocephalic and atramatic. [place] oxygen in place ?Neck:  No JVD.  ?Lungs: Coarse breath sounds to ascultation  anteriorly.  Normal respiratory effort on O2 by [place].  ?Heart: HRRR . Normal S1 and S2 without gallops or murmurs.  ?Abdomen: nondistended appearing ?Msk:  Normal strength and tone for age. ?Extremities: No clubbing, cyanosis or edema.   ?Neuro: Moves all extremities ?Psych:  Somnolent ? ?Labs: ?  ?Lab Results  ?Component Value Date  ? WBC 10.4 10/14/2021  ? HGB 12.9 10/14/2021  ? HCT 37.5 10/14/2021  ? MCV 112.6 (H) 10/14/2021  ? PLT 289 10/14/2021  ?  ?Recent Labs  ?Lab 10/09/21 ?1345 10/10/21 ?0416 10/14/21 ?7142  ?NA 137   < > 139  ?K 4.1   < > 4.5  ?CL 97*   < > 103  ?CO2 32   < > 27  ?BUN 29*   < > 72*  ?CREATININE 1.69*   < > 2.30*  ?CALCIUM 8.4*   < > 8.6*  ?PROT 6.8  --   --   ?BILITOT 0.9  --   --   ?ALKPHOS 69  --   --   ?ALT 7  --   --   ?AST 28  --   --   ?GLUCOSE 176*   < > 168*  ? < > = values in this interval not displayed.  ? ? ?No results found for: CKTOTAL, CKMB, CKMBINDEX, TROPONINI  ?Lab Results  ?Component Value Date  ? CHOL 149 08/26/2017  ? CHOL 142 02/27/2016  ? CHOL 149 01/15/2015  ? ?Lab Results  ?Component Value Date  ? HDL 47 08/26/2017  ? HDL 64 02/27/2016  ? HDL 51 01/15/2015  ? ?Lab Results  ?Component Value Date  ? [place] 74 08/26/2017  ? [place] 54 02/27/2016  ? [place] 67 01/15/2015  ? ?Lab Results  ?Component Value Date  ? TRIG 141 08/26/2017  ? TRIG 120 02/27/2016  ? TRIG 155 (H) 01/15/2015  ? ?Lab Results  ?Component Value Date  ? CHOLHDL 3.2 08/26/2017  ? ?No results found for: LDLDIRECT   ?  ?Radiology: US RENAL ? ?Result Date: 10/12/2021 ?CLINICAL DATA:  Acute kidney injury. EXAM: RENAL / URINARY TRACT ULTRASOUND COMPLETE COMPARISON:  CT without contrast on 04/12/2021 and prior renal ultrasound on 02/04/2017 F

## 2021-10-15 ENCOUNTER — Inpatient Hospital Stay: Payer: Medicare PPO

## 2021-10-15 DIAGNOSIS — I509 Heart failure, unspecified: Secondary | ICD-10-CM | POA: Diagnosis not present

## 2021-10-15 LAB — CBC
HCT: 39.4 % (ref 36.0–46.0)
Hemoglobin: 13.8 g/dL (ref 12.0–15.0)
MCH: 42.5 pg — ABNORMAL HIGH (ref 26.0–34.0)
MCHC: 35 g/dL (ref 30.0–36.0)
MCV: 121.2 fL — ABNORMAL HIGH (ref 80.0–100.0)
Platelets: 215 10*3/uL (ref 150–400)
RBC: 3.25 MIL/uL — ABNORMAL LOW (ref 3.87–5.11)
RDW: 20.1 % — ABNORMAL HIGH (ref 11.5–15.5)
WBC: 13.4 10*3/uL — ABNORMAL HIGH (ref 4.0–10.5)
nRBC: 0.7 % — ABNORMAL HIGH (ref 0.0–0.2)

## 2021-10-15 LAB — RENAL FUNCTION PANEL
Albumin: 2.9 g/dL — ABNORMAL LOW (ref 3.5–5.0)
Anion gap: 10 (ref 5–15)
BUN: 65 mg/dL — ABNORMAL HIGH (ref 8–23)
CO2: 25 mmol/L (ref 22–32)
Calcium: 8.4 mg/dL — ABNORMAL LOW (ref 8.9–10.3)
Chloride: 103 mmol/L (ref 98–111)
Creatinine, Ser: 2.13 mg/dL — ABNORMAL HIGH (ref 0.44–1.00)
GFR, Estimated: 21 mL/min — ABNORMAL LOW (ref >60)
Glucose, Bld: 174 mg/dL — ABNORMAL HIGH (ref 70–99)
Phosphorus: 2.9 mg/dL (ref 2.5–4.6)
Potassium: 4.2 mmol/L (ref 3.5–5.1)
Sodium: 138 mmol/L (ref 135–145)

## 2021-10-15 LAB — CBC WITH DIFFERENTIAL/PLATELET
Abs Immature Granulocytes: 1.06 10*3/uL — ABNORMAL HIGH (ref 0.00–0.07)
Basophils Absolute: 0.1 10*3/uL (ref 0.0–0.1)
Basophils Relative: 1 %
Eosinophils Absolute: 0.2 10*3/uL (ref 0.0–0.5)
Eosinophils Relative: 1 %
HCT: 38.6 % (ref 36.0–46.0)
Hemoglobin: 13.3 g/dL (ref 12.0–15.0)
Immature Granulocytes: 7 %
Lymphocytes Relative: 21 %
Lymphs Abs: 3.4 10*3/uL (ref 0.7–4.0)
MCH: 39.8 pg — ABNORMAL HIGH (ref 26.0–34.0)
MCHC: 34.5 g/dL (ref 30.0–36.0)
MCV: 115.6 fL — ABNORMAL HIGH (ref 80.0–100.0)
Monocytes Absolute: 2.9 10*3/uL — ABNORMAL HIGH (ref 0.1–1.0)
Monocytes Relative: 18 %
Neutro Abs: 8.7 10*3/uL — ABNORMAL HIGH (ref 1.7–7.7)
Neutrophils Relative %: 52 %
Platelets: 258 10*3/uL (ref 150–400)
RBC: 3.34 MIL/uL — ABNORMAL LOW (ref 3.87–5.11)
RDW: 18.9 % — ABNORMAL HIGH (ref 11.5–15.5)
Smear Review: NORMAL
WBC: 16.4 10*3/uL — ABNORMAL HIGH (ref 4.0–10.5)
nRBC: 0.5 % — ABNORMAL HIGH (ref 0.0–0.2)

## 2021-10-15 LAB — GLUCOSE, CAPILLARY
Glucose-Capillary: 130 mg/dL — ABNORMAL HIGH (ref 70–99)
Glucose-Capillary: 169 mg/dL — ABNORMAL HIGH (ref 70–99)
Glucose-Capillary: 255 mg/dL — ABNORMAL HIGH (ref 70–99)
Glucose-Capillary: 214 mg/dL — ABNORMAL HIGH (ref 70–99)

## 2021-10-15 MED ORDER — GUAIFENESIN-DM 100-10 MG/5ML PO SYRP
5.0000 mL | ORAL_SOLUTION | ORAL | Status: DC | PRN
  Administered 2021-10-15 – 2021-10-16 (×2): 5 mL via ORAL
  Filled 2021-10-15 (×2): qty 10

## 2021-10-15 NOTE — Progress Notes (Signed)
[facility] Cardiology ? ?CARDIOLOGY CONSULT NOTE  ?Patient ID: ?Renee Carpenter ?MRN: [id number] ?DOB/AGE: 11/25/1925 88 y.o. ? ?Admit date: 10/09/2021 ?Referring Physician Ralene Muskrat ?Primary Physician Jerrol Banana., MD ?Primary Cardiologist Nehemiah Massed ?Reason for Consultation AoCHF ? ?HPI:  ?Renee Carpenter is a 88 year old female with history of CHF (EF25-30%), chronic left bundle branch block, CKD 3, hypertension, hyperlipidemia was admitted with shortness of breath, fatigue, altered mental status.  She was discovered to have sepsis from a urinary source.  Cardiology is consulted for further assistance. ? ?Interval history: ?- more alert this morning, able to tell her me hands feel cold but doesn't verbalize much else ?- has a productive cough today, worsening leukocytosis  ? ?Review of systems unable to assess due to patient's mental status ? ? ? ?Past Medical History:  ?Diagnosis Date  ? Anemia   ? WAS ANEMIC  ? Arthritis   ? Blood transfusion   ? HAD 1 SEVERAL YRS AGO...3-4 YR  ? CHF (congestive heart failure) ([place])   ? Chronic kidney disease   ? SOME DECREASE IN KIDNEY FUNCTION  ? Diabetes mellitus without complication ([place])   ? Hypertension   ? Normal cardiac stress test   ? REQUESTING [place]  ? Normal echocardiogram   ? REQUESTING THAT RESULT -- DR [name]   ? PONV (postoperative nausea and vomiting)   ? Shortness of breath   ? Sleep apnea   ? 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  ?Past Surgical History:  ?Procedure Laterality Date  ? ABDOMINAL HYSTERECTOMY    ? BACK SURGERY    ? 2 CERVICAL SURGERIES ALSO  ? CATARACTS    ? RIGHT EYE CATARACT  ? CERVICAL DISCECTOMY  08/1995 and 2011  ? C4-,C5-6, C6-7 anterior cervical; Dr. Ellene Route fusion and plating  ? LAMINECTOMY    ? decompressive; Dr. Burman Riis in [place] MICRODISCECTOMY  04/14/2011  ? Procedure: LUMBAR LAMINECTOMY/DECOMPRESSION MICRODISCECTOMY;  Surgeon: [name];  Location: [place] [facility];  Service:  Neurosurgery;  Laterality: N/A;  Lumbar Three and Lumbar Four Laminectomy  ? TONSILLECTOMY AND ADENOIDECTOMY  1939  ?  ?Medications Prior to Admission  ?Medication Sig Dispense Refill Last Dose  ? albuterol (VENTOLIN HFA) 108 (90 Base) MCG/ACT inhaler INHALE 2 PUFFS INTO THE LUNGS EVERY 4 HOURS AS NEEDED FOR WHEEZING OR SHORTNESS OF BREATH 6.7 g 3 10/09/2021  ? carbidopa-levodopa (SINEMET IR) 25-100 MG tablet Take 1 tablet by mouth 3 (three) times daily. Take 1 tablet three times a day   10/09/2021  ? Coenzyme Q10 (CO Q10) 200 MG CAPS Take 1 capsule by mouth daily.   10/08/2021 at 1500  ? DULoxetine (CYMBALTA) 30 MG capsule TAKE 1 CAPSULE(30 MG) BY MOUTH DAILY 30 capsule 2 10/09/2021  ? empagliflozin (JARDIANCE) 10 MG TABS tablet Take by mouth daily.   10/09/2021  ? esomeprazole (NEXIUM) 40 MG capsule TAKE 1 CAPSULE BY MOUTH EVERY DAY 90 capsule 1 10/09/2021  ? fluticasone (FLONASE) 50 MCG/ACT nasal spray Place 2 sprays into both nostrils daily. 16 g 6 10/09/2021  ? furosemide (LASIX) 40 MG tablet Take 1 tablet (40 mg total) by mouth daily. 30 tablet 2 10/09/2021  ? gabapentin (NEURONTIN) 600 MG tablet TAKE 1 TABLET(600 MG) BY MOUTH THREE TIMES DAILY 270 tablet 3 10/09/2021  ? glimepiride (AMARYL) 4 MG tablet TAKE 1 TABLET(4 MG) BY MOUTH DAILY BEFORE BREAKFAST 90 tablet 1 10/09/2021  ? hydroxypropyl methylcellulose (ISOPTO TEARS) 2.5 % ophthalmic solution Place 1 drop into  both eyes 3 (three) times daily as needed. Dry eyes    10/08/2021  ? MAGNESIUM-OXIDE 400 (241.3 Mg) MG tablet TAKE 1 TABLET BY MOUTH TWICE DAILY 60 tablet 11 10/09/2021  ? metoprolol succinate (TOPROL-XL) 25 MG 24 hr tablet Take 0.5 tablets (12.5 mg total) by mouth daily. 15 tablet 2 10/09/2021  ? MULTIPLE VITAMIN PO Take 1 tablet by mouth daily.   10/09/2021  ? pregabalin (LYRICA) 50 MG capsule Take 50 mg by mouth 2 (two) times daily.   10/09/2021  ? Probiotic Product (ALIGN PO) Take 1 capsule by mouth daily.   10/09/2021  ? rOPINIRole (REQUIP) 0.5 MG tablet Take 0.5 mg by  mouth 3 (three) times daily.   10/09/2021  ? Accu-Chek FastClix Lancets MISC Just check first thing in the morning to ensure no low blood sugar. 102 each 1   ? blood glucose meter kit and supplies KIT Just check first thing in the morning to ensure no low blood sugar. 1 each 0   ? Blood Glucose Monitoring Suppl (ACCU-CHEK GUIDE) w/Device KIT Check blood sugars daily as directed 1 kit 0   ? DULoxetine (CYMBALTA) 20 MG capsule Take 40 mg by mouth daily. (Patient not taking: Reported on 10/09/2021)   Not Taking  ? glucose blood (ACCU-CHEK GUIDE) test strip Just check first thing in the morning to ensure no low blood sugar. 100 strip 1   ? mupirocin ointment (BACTROBAN) 2 % Apply topically 2 (two) times daily.     ? tamsulosin (FLOMAX) 0.4 MG CAPS capsule TAKE 1 CAPSULE(0.4 MG) BY MOUTH DAILY (Patient not taking: Reported on 10/09/2021) 90 capsule 0 Not Taking  ? ? ?Social History  ? ?Socioeconomic History  ? Marital status: Widowed  ?  Spouse name: Not on file  ? Number of children: 1  ? Years of education: Not on file  ? Highest education level: Bachelor's degree (e.g., BA, AB, BS)  ?Occupational History  ? Occupation: retired  ?Tobacco Use  ? Smoking status: Never  ? Smokeless tobacco: Never  ?Substance and Sexual Activity  ? Alcohol use: No  ? Drug use: No  ? Sexual activity: Never  ?Other Topics Concern  ? Not on file  ?Social History Narrative  ? Not on file  ? ?Social Determinants of Health  ? ?Financial Resource Strain: Not on file  ?Food Insecurity: Not on file  ?Transportation Needs: Not on file  ?Physical Activity: Not on file  ?Stress: Not on file  ?Social Connections: Not on file  ?Intimate Partner Violence: Not on file  ?  ?Family History  ?Problem Relation Age of Onset  ? Heart failure Father   ? Heart disease Father   ? Hypertension Mother   ? Diabetes Mother   ? Heart disease Mother   ? Cancer Sister   ?     breast  ? Breast cancer Sister 99  ? Cancer Maternal Grandfather   ? Cancer Maternal Aunt   ?     x3   ? Breast cancer Daughter 37  ?  ? ?PHYSICAL EXAM ? ?General: Elderly ill and frail appearing female, sitting upright in hospital bed. ?HEENT:  Normocephalic and atramatic. [place] oxygen in place ?Neck:  No JVD.  ?Lungs: Normal respiratory effort on O2 by [place]. Coarse breath sounds to ascultation throughout ?Heart: HRRR . Normal S1 and S2 without gallops or murmurs.  ?Abdomen: nondistended appearing ?Msk:  Normal strength and tone for age. ?Extremities: No clubbing, cyanosis. Trace bilateral LE edema.   ?  Neuro: Moves all extremities ?Psych:  awake, more alert than yesterday still not very conversant ? ?Labs: ?  ?Lab Results  ?Component Value Date  ? WBC 16.4 (H) 10/15/2021  ? HGB 13.3 10/15/2021  ? HCT 38.6 10/15/2021  ? MCV 115.6 (H) 10/15/2021  ? PLT 258 10/15/2021  ?  ?Recent Labs  ?Lab 10/09/21 ?1345 10/10/21 ?0416 10/14/21 ?6803  ?NA 137   < > 139  ?K 4.1   < > 4.5  ?CL 97*   < > 103  ?CO2 32   < > 27  ?BUN 29*   < > 72*  ?CREATININE 1.69*   < > 2.30*  ?CALCIUM 8.4*   < > 8.6*  ?PROT 6.8  --   --   ?BILITOT 0.9  --   --   ?ALKPHOS 69  --   --   ?ALT 7  --   --   ?AST 28  --   --   ?GLUCOSE 176*   < > 168*  ? < > = values in this interval not displayed.  ? ? ?No results found for: CKTOTAL, CKMB, CKMBINDEX, TROPONINI  ?Lab Results  ?Component Value Date  ? CHOL 149 08/26/2017  ? CHOL 142 02/27/2016  ? CHOL 149 01/15/2015  ? ?Lab Results  ?Component Value Date  ? HDL 47 08/26/2017  ? HDL 64 02/27/2016  ? HDL 51 01/15/2015  ? ?Lab Results  ?Component Value Date  ? [place] 74 08/26/2017  ? [place] 54 02/27/2016  ? [place] 67 01/15/2015  ? ?Lab Results  ?Component Value Date  ? TRIG 141 08/26/2017  ? TRIG 120 02/27/2016  ? TRIG 155 (H) 01/15/2015  ? ?Lab Results  ?Component Value Date  ? CHOLHDL 3.2 08/26/2017  ? ?No results found for: LDLDIRECT  ?  ?Radiology: US RENAL ? ?Result Date: 10/12/2021 ?CLINICAL DATA:  Acute kidney injury. EXAM: RENAL / URINARY TRACT ULTRASOUND COMPLETE COMPARISON:  CT without contrast on  04/12/2021 and prior renal ultrasound on 02/04/2017 FINDINGS: Right Kidney: Renal measurements: 10 x 4.2 x 3.8 cm = volume: 83 mL. The right kidney demonstrates cortical atrophy and increased cortical echogenicity. No h

## 2021-10-15 NOTE — TOC Progression Note (Signed)
Transition of Care (TOC) - Progression Note  ? ? ?Patient Details  ?Name: Julicia Krieger Keiper ?MRN: [id number] ?Date of Birth: 06-07-1926 ? ?Transition of Care (TOC) CM/SW Contact  ?Truddie Hidden, RN ?Phone Number: ?10/15/2021, 3:27 PM ? ?Clinical Narrative:    ?Spoke with Doristine Counter  from [place]. [facility] [place] requesting FL2 and initial referral. Patient would need to be authorized and in the building by 1:30pm or discharge would be pended for  the following Monday.  ? ? ?  ?  ? ?Expected Discharge Plan and Services ?  ?  ?  ?  ?  ?                ?  ?  ?  ?  ?  ?  ?  ?  ?  ?  ? ? ?Social Determinants of Health (SDOH) Interventions ?  ? ?Readmission Risk Interventions ?   ? View : No data to display.  ?  ?  ?  ? ? ?

## 2021-10-15 NOTE — Plan of Care (Signed)

## 2021-10-15 NOTE — Progress Notes (Signed)
Speech Language Pathology Treatment: Dysphagia  ?Patient Details ?Name: Renee Carpenter ?MRN: [id number] ?DOB: 12-29-1925 ?Today's Date: 10/15/2021 ?Time: [id number] ?SLP Time Calculation (min) (ACUTE ONLY): 58 min ? ?Assessment / Plan / Recommendation ?Clinical Impression ? Patient seen for reassessment for dysphagia. Yesterday evening, RN reported pt tolerating ice chips and spoonfuls of [place] ice, requested reassessment to allow these for pt comfort. Pt sleeping upon SLP arrival, but easily awakened and was cooperative with repositioning and oral care. Pt reported she is feeling better today. Pt alert and conversational with therapist throughout session. Vocal quality hoarse but improves with POs. She was able to self-feed from breakfast tray with set-up and occasional min-mod assistance. Pt consumed oatmeal, cup sips of nectar liquid without overt signs of aspiration. Advanced to trials of ice chips, spoonfuls of [place] ice without overt s/sx aspiration. Pt requested water; sipped thin water via cup with occasional min assist. Vocal quality remained clear, vital signs stable. With larger sip, delayed throat clear x1. No overt s/sx with smaller sips. Given improved alertness, ability to self-feed, and reduced s/sx aspiration, recommend advancement to allow thin liquids; continue regular solids with meats chopped/moistened. Will request kitchen continue to send nectar-thick liquids with trays as this may improve pt comfort with meals. Recommend strict aspiration precautions: Position pt fully upright 90 degrees for POs, assist with feeding but allow pt to self-feed as able. If coughing with intake, can attempt nectar liquids if this improves/reduces symptoms.  ? ?  ?HPI HPI: 88 y.o. female  with past medical history of CHF with a EF of 25 to 30%, CKD 3, DM 2, Parkinsonism, COPD, esophageal reflux, OSA, HTN, arthritis, admitted on 10/09/2021 with acute decompensated systolic heart failure/acute hypoxic respiratory  failure, UTI with AKI. ?  ?   ?SLP Plan ? Continue with current plan of care ? ?  ?  ?Recommendations for follow up therapy are one component of a multi-disciplinary discharge planning process, led by the attending physician.  Recommendations may be updated based on patient status, additional functional criteria and insurance authorization. ?  ? ?Recommendations  ?Diet recommendations: Regular;Thin liquid;Nectar-thick liquid (meats chopped, moistened. request to send nectar with trays for pt comfort) ?Liquids provided via: Cup ?Medication Administration: Crushed with puree ?Supervision: Staff to assist with self feeding (assist as needed) ?Compensations: Slow rate;Small sips/bites  ?   ?    ?   ? ? ? ? Oral Care Recommendations: Oral care BID;Oral care prior to ice chip/H20 ?Follow Up Recommendations: Other (comment) (tbd) ?Assistance recommended at discharge: Frequent or constant Supervision/Assistance ?SLP Visit Diagnosis: Dysphagia, oropharyngeal phase (R13.12) ?Plan: Continue with current plan of care ? ? ? ? ?  ?  ?Renee Lever, MS, CCC-SLP ?Speech-Language Pathologist ?[phone] ? ? ?Renee Carpenter ? ?10/15/2021, 9:13 AM ?

## 2021-10-15 NOTE — Progress Notes (Signed)
CLEMMA MANDALA  MRN: [id number]  DOB/AGE: 1925/10/26 88 y.o.  Primary Care Physician:Gilbert, Leonette Monarch., MD  Admit date: 10/09/2021  Chief Complaint:  Chief Complaint  Patient presents with   Shortness of Breath   Fever    S-Pt presented on  10/09/2021 with  Chief Complaint  Patient presents with   Shortness of Breath   Fever  .   Patient is a 88 year old Caucasian female with a past medical history of chronic systolic CHF with ejection fraction of around 25 to 30%, CKD stage IIIb, diabetes mellitus type 2, Parkinson's, obstructive sleep apnea, hypertension who was brought to the ER with chief complaint of shortness of breath.  Patient was laying uncomfortably on the bed.  Medications  carbidopa-levodopa  1 tablet Oral TID   Chlorhexidine Gluconate Cloth  6 each Topical Daily   DULoxetine  30 mg Oral Daily   gabapentin  200 mg Oral Q8H   heparin injection (subcutaneous)  5,000 Units Subcutaneous Q8H   insulin aspart  0-15 Units Subcutaneous TID WC   insulin aspart  0-5 Units Subcutaneous QHS   metoprolol tartrate  12.5 mg Oral BID   pantoprazole sodium  40 mg Oral Daily   rOPINIRole  0.5 mg Oral TID   sodium chloride flush  3 mL Intravenous Q12H         ROS: Confused unable to offer but history  Physical Exam: Vital signs in last 24 hours: Temp:  [97.5 F (36.4 C)-97.9 F (36.6 C)] 97.5 F (36.4 C) (05/11 2013) Pulse Rate:  [59-85] 65 (05/11 2013) Resp:  [17-18] 18 (05/11 2013) BP: (111-140)/(59-78) 131/61 (05/11 2013) SpO2:  [92 %-99 %] 92 % (05/11 2013) Weight:  [69.1 kg] 69.1 kg (05/11 0618) Weight change:  Last BM Date : 10/14/21  Intake/Output from previous day: 05/10 0701 - 05/11 0700 In: 480 [P.O.:480] Out: 1525 [Urine:1525] No intake/output data recorded.   Physical Exam:  General- pt is confused does occasionally follows commands Resp- No acute REsp distress,  Rhonchi+, Decreased breath sounds at bases CVS- S1S2 regular in rate and  rhythm GIT- BS+, soft, NT, ND EXT- NO LE Edema, Cyanosis CNS- CN 2-12 grossly intact. Moving all 4 extremities  Lab Results:  CBC  Recent Labs    10/15/21 0713 10/15/21 1117  WBC 16.4* 13.4*  HGB 13.3 13.8  HCT 38.6 39.4  PLT 258 215    BMET  Recent Labs    10/14/21 0832 10/15/21 0930  NA 139 138  K 4.5 4.2  CL 103 103  CO2 27 25  GLUCOSE 168* 174*  BUN 72* 65*  CREATININE 2.30* 2.13*  CALCIUM 8.6* 8.4*      Most recent Creatinine trend  Lab Results  Component Value Date   CREATININE 2.13 (H) 10/15/2021   CREATININE 2.30 (H) 10/14/2021   CREATININE 2.52 (H) 10/13/2021      MICRO   Recent Results (from the past 240 hour(s))  Resp Panel by RT-PCR (Flu A&B, Covid) Nasopharyngeal Swab     Status: None   Collection Time: 10/09/21  1:45 PM   Specimen: Nasopharyngeal Swab; Nasopharyngeal(NP) swabs in vial transport medium  Result Value Ref Range Status   SARS Coronavirus 2 by RT PCR NEGATIVE NEGATIVE Final    Comment: (NOTE) SARS-CoV-2 target nucleic acids are NOT DETECTED.  The SARS-CoV-2 RNA is generally detectable in upper respiratory specimens during the acute phase of infection. The lowest concentration of SARS-CoV-2 viral copies this assay can detect is 138 copies/mL.  A negative result does not preclude SARS-Cov-2 infection and should not be used as the sole basis for treatment or other patient management decisions. A negative result may occur with  improper specimen collection/handling, submission of specimen other than nasopharyngeal swab, presence of viral mutation(s) within the areas targeted by this assay, and inadequate number of viral copies(<138 copies/mL). A negative result must be combined with clinical observations, patient history, and epidemiological information. The expected result is Negative.  Fact Sheet for Patients:  BloggerCourse.com  Fact Sheet for Healthcare Providers:   SeriousBroker.it  This test is no t yet approved or cleared by the [place] FDA and  has been authorized for detection and/or diagnosis of SARS-CoV-2 by FDA under an Emergency Use Authorization (EUA). This EUA will remain  in effect (meaning this test can be used) for the duration of the COVID-19 declaration under Section 564(b)(1) of the Act, 21 U.S.C.section 360bbb-3(b)(1), unless the authorization is terminated  or revoked sooner.       Influenza A by PCR NEGATIVE NEGATIVE Final   Influenza B by PCR NEGATIVE NEGATIVE Final    Comment: (NOTE) The Xpert Xpress SARS-CoV-2/FLU/RSV plus assay is intended as an aid in the diagnosis of influenza from Nasopharyngeal swab specimens and should not be used as a sole basis for treatment. Nasal washings and aspirates are unacceptable for Xpert Xpress SARS-CoV-2/FLU/RSV testing.  Fact Sheet for Patients: BloggerCourse.com  Fact Sheet for Healthcare Providers: SeriousBroker.it  This test is not yet approved or cleared by the [place] FDA and has been authorized for detection and/or diagnosis of SARS-CoV-2 by FDA under an Emergency Use Authorization (EUA). This EUA will remain in effect (meaning this test can be used) for the duration of the COVID-19 declaration under Section 564(b)(1) of the Act, 21 U.S.C. section 360bbb-3(b)(1), unless the authorization is terminated or revoked.  Performed at [facility], [address] [place], [place] [address]   Blood Culture (routine x 2)     Status: None   Collection Time: 10/09/21  1:45 PM   Specimen: BLOOD RIGHT HAND  Result Value Ref Range Status   Specimen Description BLOOD RIGHT HAND  Final   Special Requests   Final    BOTTLES DRAWN AEROBIC AND ANAEROBIC Blood Culture adequate volume   Culture   Final    NO GROWTH 5 DAYS Performed at [facility], [place], [address], [place],  [place] [address]    Report Status 10/14/2021 FINAL  Final  Blood Culture (routine x 2)     Status: None   Collection Time: 10/09/21  1:45 PM   Specimen: BLOOD  Result Value Ref Range Status   Specimen Description BLOOD LEFT ANTECUBITAL  Final   Special Requests   Final    BOTTLES DRAWN AEROBIC AND ANAEROBIC Blood Culture adequate volume   Culture   Final    NO GROWTH 5 DAYS Performed at [facility], [address], [place], [place] [address]    Report Status 10/14/2021 FINAL  Final  MRSA Next Gen by PCR, Nasal     Status: None   Collection Time: 10/10/21  9:21 AM   Specimen: Nasal Mucosa; Nasal Swab  Result Value Ref Range Status   MRSA by PCR Next Gen NOT DETECTED NOT DETECTED Final    Comment: (NOTE) The GeneXpert MRSA Assay (FDA approved for NASAL specimens only), is one component of a comprehensive MRSA colonization surveillance program. It is not intended to diagnose MRSA infection nor to guide or  monitor treatment for MRSA infections. Test performance is not FDA approved in patients less than 37 years old. Performed at [facility], [address], [place], [place] [address]      Renal ultrasound done on August 12, 2021 was reviewed FINDINGS: Right Kidney:   Renal measurements: 10 x 4.2 x 3.8 cm = volume: 83 mL. The right kidney demonstrates cortical atrophy and increased cortical echogenicity. No hydronephrosis or focal lesion identified.   Left Kidney:   Renal measurements: 9.2 x 4.4 x 3.5 cm = volume: 75 mL. The left kidney demonstrates increased cortical echogenicity. No hydronephrosis or focal lesion identified.   Bladder:   The bladder is decompressed by a Foley catheter.   Other:   None.   IMPRESSION: Increased cortical echogenicity of both kidneys is consistent with underlying chronic kidney disease. No hydronephrosis.       Impression:  1)Renal   Acute kidney injury AKI secondary to ATN AKI secondary to multiple factors AKI  secondary to UTI/sepsis AKI secondary to hypotension as patient has SBP of less than 80 recently AKI secondary to medications as patient was recently started on Jardiance AKI secondary to cardiorenal syndrome   Patient has AKI on CKD Patient has CKD stage IIIb Patient has CKD age associated decline As patient is 88 years old Patient has CKD since 2016 Patient has CKD secondary to diabetes mellitus     AKI is better. Creatinine is now trending donw We will follow the trend Will follow closely    Creatinine trend 2023 1.4==>2.5==>2.1-AKI 2022 1.4--1.6 2021  1.2--1.3 2020  1.6 2019   1.3 2018   0.9--1.1 2017  1.2--1.4 2016  1.5       2)Hypotension Patient blood pressure was low earlier Patient blood pressure is now better   3)Anemia of chronic disease     Latest Ref Rng & Units 10/15/2021   11:17 AM 10/15/2021    7:13 AM 10/14/2021    8:32 AM  CBC  WBC 4.0 - 10.5 K/uL 13.4   16.4   10.4    Hemoglobin 12.0 - 15.0 g/dL 60.4   54.0   98.1    Hematocrit 36.0 - 46.0 % 39.4   38.6   37.5    Platelets 150 - 400 K/uL 215   258   289      Patient hemoglobin was earlier at 11.3, now better     4) Secondary hyperparathyroidism -CKD Mineral-Bone Disorder    Lab Results  Component Value Date   PTH 40 10/13/2021   PTH Comment 10/13/2021   CALCIUM 8.4 (L) 10/15/2021   PHOS 2.9 10/15/2021    Secondary Hyperparathyroidism  absent  Phosphorus at goal.  5) acute on chronic systolic CHF  Patient currently has no edema Patient chest x-ray done on Oct 15, 2021 showed  IMPRESSION: Stable mild bibasilar opacities are noted concerning for subsegmental atelectasis with possible small pleural effusions.  We will hold off on starting diuretics for now   6) Electrolytes      Latest Ref Rng & Units 10/15/2021    9:30 AM 10/14/2021    8:32 AM 10/13/2021    7:52 AM  BMP  Glucose 70 - 99 mg/dL 191   478   295    BUN 8 - 23 mg/dL 65   72   71    Creatinine 0.44 - 1.00  mg/dL 6.21   3.08   6.57    Sodium 135 - 145 mmol/L 138  139   139    Potassium 3.5 - 5.1 mmol/L 4.2   4.5   4.3    Chloride 98 - 111 mmol/L 103   103   102    CO2 22 - 32 mmol/L 25   27   27     Calcium 8.9 - 10.3 mg/dL 8.4   8.6   8.6     8.6       Sodium Normonatremic   Potassium Normokalemic    7)Acid base   Co2 at goal  8)UTI/sepsis Patient is currently on antibiotics  9)Acute hypoxic respiratory failure Patient is clinically stable  Plan:  Patient admitted with AKI, CKD stage IIIb, sepsis secondary to UTI, acute hypoxic respiratory failure, acute on chronic systolic CHF I reviewed patient's labs and chest x-ray Patient creatinine is improving We will hold off on starting diuretics today We will continue the current treatment    Lendon George s [facility] 10/15/2021, 9:16 PM

## 2021-10-15 NOTE — Progress Notes (Signed)
Physical Therapy Treatment ?Patient Details ?Name: Renee Carpenter ?MRN: [id number] ?DOB: Oct 18, 1925 ?Today's Date: 10/15/2021 ? ? ?History of Present Illness Pt is a 88 y/o F admitted on 10/09/21 after presenting with c/o worsening SOB associated with fatigue & AMS. Pt also c/o lower abdominal pain & urine draw was concerning for UTI. Pt is being treated for acute decompensated systolic CHF, acute hypoxic respiratory failure, & sepsis 2/2 UTI. PMH: systolic CHF, CKD stage 3A, DM2, parkinsonism, OSA, HTN ? ?  ?PT Comments  ? ? Patient agreeable to PT with encouragement. Patient continues to require assistance with bed mobility and standing. 2 bouts of standing performed that required +2 person assistance. Facilitation for midline standing posture with standing tolerance of less than one minute. The patient is not at her baseline level of functional mobility. Recommend SNF placement at discharge for ongoing PT efforts to maximize independence and facilitate return to prior level of function.  ?  ?Recommendations for follow up therapy are one component of a multi-disciplinary discharge planning process, led by the attending physician.  Recommendations may be updated based on patient status, additional functional criteria and insurance authorization. ? ?Follow Up Recommendations ? Skilled nursing-short term rehab (<3 hours/day) ?  ?  ?Assistance Recommended at Discharge Frequent or constant Supervision/Assistance  ?Patient can return home with the following A lot of help with walking and/or transfers;A lot of help with bathing/dressing/bathroom;Assistance with feeding ?  ?Equipment Recommendations ? None recommended by PT  ?  ?Recommendations for Other Services   ? ? ?  ?Precautions / Restrictions Precautions ?Precautions: Fall ?Restrictions ?Weight Bearing Restrictions: No  ?  ? ?Mobility ? Bed Mobility ?Overal bed mobility: Needs Assistance ?Bed Mobility: Supine to Sit, Sit to Supine ?  ?  ?Supine to sit: Mod  assist ?Sit to supine: Mod assist, +2 for physical assistance ?  ?General bed mobility comments: verbal cues for technique and sequencing. extra time and effort required with bed mobility ?  ? ?Transfers ?Overall transfer level: Needs assistance ?  ?Transfers: Sit to/from Stand ?Sit to Stand: Mod assist, +2 physical assistance, Min assist ?  ?  ?  ?  ?  ?General transfer comment: faciliation for anterior weight shifting and foot placement. 2 standing bouts performed. increased assistance required with first standing bout and increased effort and independence with second standing bout. ?  ? ?Ambulation/Gait ?  ?  ?  ?  ?  ?  ?  ?General Gait Details: not attempted as patient is non ambulatory at baseline ? ? ?Stairs ?  ?  ?  ?  ?  ? ? ?Wheelchair Mobility ?  ? ?Modified Rankin (Stroke Patients Only) ?  ? ? ?  ?Balance Overall balance assessment: Needs assistance ?Sitting-balance support: Feet supported, Bilateral upper extremity supported ?Sitting balance-Leahy Scale: Fair ?Sitting balance - Comments: periods of minimal assistance initially for posterior trunk support progressing to min guard with increased sitting time ?  ?Standing balance support: Bilateral upper extremity supported ?Standing balance-Leahy Scale: Fair ?Standing balance comment: faciliation and cues for upright standing posture. Min A +2 required for standing balance with posterior lean. standing tolerance of less than one minute ?  ?  ?  ?  ?  ?  ?  ?  ?  ?  ?  ?  ? ?  ?Cognition Arousal/Alertness: Awake/alert ?Behavior During Therapy: [facility] for tasks assessed/performed ?Overall Cognitive Status: Within Functional Limits for tasks assessed ?  ?  ?  ?  ?  ?  ?  ?  ?  ?  ?  ?  ?  ?  ?  ?  ?  General Comments: patient drowsy initially but was agreeable to PT with encourgement. she is hard of hearing and needs extra time to follow commands ?  ?  ? ?  ?Exercises   ? ?  ?General Comments General comments (skin integrity, edema, etc.): heart rate in the 60's  with activity. ?  ?  ? ?Pertinent Vitals/Pain Pain Assessment ?Pain Assessment: No/denies pain  ? ? ?Home Living   ?  ?  ?  ?  ?  ?  ?  ?  ?  ?   ?  ?Prior Function    ?  ?  ?   ? ?PT Goals (current goals can now be found in the care plan section) Acute Rehab PT Goals ?Patient Stated Goal: get better, go home ?PT Goal Formulation: With patient ?Time For Goal Achievement: 10/25/21 ?Potential to Achieve Goals: Good ?Progress towards PT goals: Progressing toward goals ? ?  ?Frequency ? ? ? Min 2X/week ? ? ? ?  ?PT Plan Current plan remains appropriate  ? ? ?Co-evaluation PT/OT/SLP Co-Evaluation/Treatment: Yes ?Reason for Co-Treatment: To address functional/ADL transfers ?PT goals addressed during session: Mobility/safety with mobility ?OT goals addressed during session: ADL's and self-care ?  ? ?  ?AM-PAC PT "6 Clicks" Mobility   ?Outcome Measure ? Help needed turning from your back to your side while in a flat bed without using bedrails?: A Little ?Help needed moving from lying on your back to sitting on the side of a flat bed without using bedrails?: A Lot ?Help needed moving to and from a bed to a chair (including a wheelchair)?: A Lot ?Help needed standing up from a chair using your arms (e.g., wheelchair or bedside chair)?: A Lot ?Help needed to walk in hospital room?: Total ?Help needed climbing 3-5 steps with a railing? : Total ?6 Click Score: 11 ? ?  ?End of Session Equipment Utilized During Treatment: Oxygen ?Activity Tolerance: Patient tolerated treatment well ?Patient left: in bed;with call bell/phone within reach;with bed alarm set;with family/visitor present (personal caregiver present) ?Nurse Communication: Mobility status ?PT Visit Diagnosis: Difficulty in walking, not elsewhere classified (R26.2);Muscle weakness (generalized) (M62.81);Other abnormalities of gait and mobility (R26.89) ?  ? ? ?Time: [id number] ?PT Time Calculation (min) (ACUTE ONLY): 23 min ? ?Charges:  $Therapeutic Activity: 8-22  mins          ?          ?Minna Merritts, PT, MPT ? ? ? ?Percell Locus ?10/15/2021, 3:30 PM ? ?

## 2021-10-15 NOTE — NC FL2 (Signed)
?[place] MEDICAID FL2 LEVEL OF CARE SCREENING TOOL  ?  ? ?IDENTIFICATION  ?Patient Name: ?Renee Carpenter Birthdate: [birth year] Sex: female Admission Date (Current Location): ?10/09/2021  ?[place] and [place] Number: ? [place] ?  Facility and Address:  ?[facility], [address] [place], [place] [address] ?     Provider Number: ?[id number]  ?Attending Physician Name and Address:  ?Nolberto Hanlon, MD ? Relative Name and Phone Number:  ?  ?   ?Current Level of Care: ?Hospital Recommended Level of Care: ?[place] Prior Approval Number: ?  ? ?Date Approved/Denied: ?  PASRR Number: ?Manual review ? ?Discharge Plan: ?SNF ?  ? ?Current Diagnoses: ?Patient Active Problem List  ? Diagnosis Date Noted  ? Acute decompensated heart failure ([place]) 10/09/2021  ? Seasonal allergic rhinitis 07/28/2021  ? Incontinence of feces 07/28/2021  ? Acute respiratory failure with hypoxia ([place]) 07/21/2021  ? Type 2 diabetes mellitus with chronic kidney disease, without long-term current use of insulin ([place]) 07/21/2021  ? Parkinson's disease ([place]) 07/21/2021  ? Acute on chronic combined systolic and diastolic CHF (congestive heart failure) ([place]) 07/20/2021  ? Rash 07/17/2021  ? Leg pain 10/23/2020  ? OSA (obstructive sleep apnea) 04/17/2020  ? Chronic venous insufficiency 03/13/2019  ? Lymphedema 03/13/2019  ? RBD (REM behavioral disorder) 02/08/2019  ? Parkinsonian features 12/07/2017  ? Chronic systolic CHF (congestive heart failure), NYHA class 3 ([place]) 06/24/2017  ? Stage 3b chronic kidney disease (CKD) ([place]) 04/15/2016  ? Ds DNA antibody positive 04/15/2016  ? Radicular pain of both lower extremities 04/13/2016  ? Spine pain, lumbar 04/13/2016  ? ANA positive 04/06/2016  ? Right upper quadrant pain 09/11/2015  ? Neuropathic pain 04/15/2015  ? Cervical pain 01/24/2015  ? Depression 01/01/2015  ? Back pain, chronic 12/14/2014  ? COPD, mild ([place]) 12/14/2014  ? Anxiety, generalized 12/14/2014  ?  Esophageal reflux 12/14/2014  ? Diverticulitis 12/14/2014  ? Progeria syndrome 12/14/2014  ? BP (high blood pressure) 12/14/2014  ? Adaptive colitis 12/14/2014  ? Cannot sleep 12/14/2014  ? Affective disorder, major 12/14/2014  ? Chronic anemia 12/14/2014  ? Arthritis, degenerative 12/14/2014  ? OP (osteoporosis) 12/14/2014  ? Restless leg 12/14/2014  ? Neuralgia neuritis, sciatic nerve 12/14/2014  ? 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 Lumbar canal stenosis 02/10/2012  ? DDD (degenerative disc disease), lumbosacral 11/19/2011  ? Neuropathy ([place]) 02/26/2008  ? Essential hypertension 02/26/2008  ? Fatigue 02/26/2008  ? DYSPNEA 02/26/2008  ? ? ?Orientation RESPIRATION BLADDER Height & Weight   ?  ? (Fluctuating orientation) ? O2, Other (Comment) (Nasal Cannula 3 L. CPAP QHS.) Continent Weight: 152 lb 5.4 oz (69.1 kg) ?Height:  5\' 5"  (165.1 cm)  ?BEHAVIORAL SYMPTOMS/MOOD NEUROLOGICAL BOWEL NUTRITION STATUS  ? (None)  (None) Continent Diet (Needs feeding assistance. Chop and moisten meats with extra sauce/gravy. Thin liquids allowed; please also send 2 nectar-thick liquids on each tray.)  ?AMBULATORY STATUS COMMUNICATION OF NEEDS Skin   ?Extensive Assist Verbally Bruising ?  ?  ?  ?    ?     ?     ? ? ?Personal Care Assistance Level of Assistance  ?Bathing, Feeding, Dressing Bathing Assistance: Limited assistance ?Feeding assistance: Limited assistance ?Dressing Assistance: Limited assistance ?   ? ?Functional Limitations Info  ?Sight, Hearing, Speech Sight Info: Adequate ?Hearing Info: Adequate ?Speech Info: Adequate  ? ? ?SPECIAL CARE FACTORS FREQUENCY  ?PT (By licensed PT), OT (By licensed OT)   ?  ?  PT Frequency: 5 x week ?OT Frequency: 5 x week ?  ?  ?  ?   ? ? ?Contractures Contractures Info: Not present  ? ? ?Additional Factors Info  ?Code Status, Allergies Code Status Info: DNR ?Allergies Info: Acetaminophen, Aspirin, Metformin And Related, Milk-related Compounds, Other, Sulfa  Antibiotics, Sulfasalazine, Tizanidine, Caffeine, Ginger, Ibuprofen ?  ?  ?  ?   ? ?Current Medications (10/15/2021):  This is the current hospital active medication list ?Current Facility-Administered Medications  ?Medication Dose Route Frequency Provider Last Rate Last Admin  ? 0.9 %  sodium chloride infusion  250 mL Intravenous PRN Sreenath, Sudheer B, MD      ? acetaminophen (TYLENOL) tablet 650 mg  650 mg Oral Q4H PRN Ralene Muskrat B, MD   650 mg at 10/10/21 [id number]  ? ALPRAZolam Duanne Moron) tablet 0.25 mg  0.25 mg Oral BID PRN Ralene Muskrat B, MD   0.25 mg at 10/14/21 2338  ? carbidopa-levodopa (SINEMET IR) 25-100 MG per tablet immediate release 1 tablet  1 tablet Oral TID Sidney Ace, MD   1 tablet at 10/15/21 0936  ? Chlorhexidine Gluconate Cloth 2 % PADS 6 each  6 each Topical Daily Sidney Ace, MD   6 each at 10/15/21 [phone]  ? DULoxetine (CYMBALTA) DR capsule 30 mg  30 mg Oral Daily Ralene Muskrat B, MD   30 mg at 10/15/21 0936  ? fluticasone (FLONASE) 50 MCG/ACT nasal spray 2 spray  2 spray Each Nare Daily PRN Sreenath, Sudheer B, MD      ? gabapentin (NEURONTIN) 250 MG/5ML solution 200 mg  200 mg Oral Q8H Virl Cagey E, RPH   200 mg at 10/15/21 1328  ? guaiFENesin-dextromethorphan (ROBITUSSIN DM) 100-10 MG/5ML syrup 5 mL  5 mL Oral Q4H PRN Nolberto Hanlon, MD   5 mL at 10/15/21 1354  ? heparin injection 5,000 Units  5,000 Units Subcutaneous Q8H Dorothe Pea, RPH   5,000 Units at 10/15/21 1328  ? insulin aspart (novoLOG) injection 0-15 Units  0-15 Units Subcutaneous TID WC Ralene Muskrat B, MD   3 Units at 10/15/21 1329  ? insulin aspart (novoLOG) injection 0-5 Units  0-5 Units Subcutaneous QHS Ralene Muskrat B, MD   2 Units at 10/10/21 2306  ? ipratropium-albuterol (DUONEB) 0.5-2.5 (3) MG/3ML nebulizer solution 3 mL  3 mL Nebulization Q4H PRN Ralene Muskrat B, MD   3 mL at 10/13/21 1108  ? magic mouthwash w/lidocaine  10 mL Oral TID PRN Ralene Muskrat B, MD   10 mL at  10/14/21 1628  ? metoprolol tartrate (LOPRESSOR) tablet 12.5 mg  12.5 mg Oral BID Tristan Schroeder, PA-C   12.5 mg at 10/15/21 [id number]  ? ondansetron (ZOFRAN) injection 4 mg  4 mg Intravenous Q6H PRN Sreenath, Sudheer B, MD      ? oxybutynin (DITROPAN) tablet 5 mg  5 mg Oral Q8H PRN Sreenath, Sudheer B, MD      ? oxyCODONE (Oxy IR/ROXICODONE) immediate release tablet 5 mg  5 mg Oral Q4H PRN Ralene Muskrat B, MD   5 mg at 10/14/21 1515  ? pantoprazole sodium (PROTONIX) 40 mg/20 mL oral suspension 40 mg  40 mg Oral Daily Dorothe Pea, RPH   40 mg at 10/15/21 [id number]  ? phenol (CHLORASEPTIC) mouth spray 1 spray  1 spray Mouth/Throat PRN Ralene Muskrat B, MD   1 spray at 10/14/21 1521  ? polyvinyl alcohol (LIQUIFILM TEARS) 1.4 % ophthalmic solution 1 drop  1 drop Both  Eyes TID PRN Ralene Muskrat B, MD   1 drop at 10/11/21 0532  ? rOPINIRole (REQUIP) tablet 0.5 mg  0.5 mg Oral TID Ralene Muskrat B, MD   0.5 mg at 10/15/21 0936  ? sodium chloride flush (NS) 0.9 % injection 3 mL  3 mL Intravenous Q12H Ralene Muskrat B, MD   3 mL at 10/15/21 0939  ? sodium chloride flush (NS) 0.9 % injection 3 mL  3 mL Intravenous PRN Sidney Ace, MD      ? ? ? ?Discharge Medications: ?Please see discharge summary for a list of discharge medications. ? ?Relevant Imaging Results: ? ?Relevant Lab Results: ? ? ?Additional Information ?SS#: [id number] ? ?Candie Chroman, LCSW ? ? ? ? ?

## 2021-10-15 NOTE — TOC CM/SW Note (Signed)
RE: Renee Carpenter ?Date of Birth: 1926/05/30 ?Date: 10/15/2021 ? ? ?To Whom It May Concern: ? ?Please be advised that the above-named patient will require a short-term nursing home stay - anticipated 30 days or less for rehabilitation and strengthening.  The plan is for return home. ?

## 2021-10-15 NOTE — Progress Notes (Signed)
?PROGRESS NOTE ? ? ? ?Renee Carpenter  [id number] DOB: 1925/10/17 DOA: 10/09/2021 ?PCP: Jerrol Banana., MD  ? ? ?Brief Narrative:  ?88 y.o. female with medical history significant of systolic congestive heart failure EF 25 to 30%, CKD stage IIIa, type 2 diabetes mellitus, parkinsonism, obstructive sleep apnea, hypertension presents for evaluation of progressively worsening shortness of breath associated with fatigue and altered mentation.  [name] is compliance with all medications.  No abdominal pain ?  ?No home oxygen requirement.  On presentation brought in by EMS on 4 L nasal cannula after being found hypoxic at home.  Patient is a poor historian.  History obtained from multiple sources including the patient, patient's husband, ER physician, ER documentation. ?  ?On my evaluation patient is uncomfortable appearing.  Difficult to obtain history from.  Per husband at bedside patient is complaining of lower abdominal pain.  Urine drawn on admission is concerning for urinary tract infection although patient is afebrile with normal lactic acidosis and no leukocytosis.  Patient was febrile with Tmax 102.4.  Patient has lab work including elevated BNP and chest x-ray that demonstrates evidence of interstitial edema. ?  ?Alternates between agitated and lethargic.  Mental status remains poor.  No change in oxygen requirement.  Breakthrough fevers noted ?  ?Acutely hypotensive 5/6.  Midodrine, stress dose steroids, albumin administered.  Blood pressure responded.  On 5/7 much more awake.  Remains on 4 L O2. ?  ?5/9.  Shock physiology improved.  Midodrine weaned off with stabilization of blood pressure ?  ? ?5/10 creatinine down mildly.  ?5/11 no overnight issues ? ? ? ?Consultants:  ?Cardiology, nephrology ? ?Procedures:  ? ?Antimicrobials:  ?  ? ? ?Subjective: ?Has no complaints. Reports no when asking sob or cp ? ?Objective: ?Vitals:  ? 10/14/21 1925 10/15/21 0112 10/15/21 0618 10/15/21 0731  ?BP:  117/64 (!) 112/59 140/78 139/66  ?Pulse: 73 (!) 59 68 85  ?Resp: 19 18 18 17   ?Temp:  97.8 ?F (36.6 ?C) 97.6 ?F (36.4 ?C) 97.8 ?F (36.6 ?C)  ?TempSrc:  Oral Oral Oral  ?SpO2: 98% 93% 99% 96%  ?Weight:   69.1 kg   ?Height:      ? ? ?Intake/Output Summary (Last 24 hours) at 10/15/2021 0817 ?Last data filed at 10/15/2021 0500 ?Gross per 24 hour  ?Intake 480 ml  ?Output 1525 ml  ?Net -1045 ml  ? ?Filed Weights  ? 10/13/21 0449 10/14/21 0946 10/15/21 0618  ?Weight: 70.2 kg 68 kg 69.1 kg  ? ? ?Examination: ?Calm, NAD ?Decrease bs, no wheezing ?Reg s1/s2 no gallop ?Soft benign +bs ?No edema ?Awake and alert. ?Mood and affect appropriate in current setting  ? ? ? ?Data Reviewed: I have personally reviewed following labs and imaging studies ? ?CBC: ?Recent Labs  ?Lab 10/10/21 ?[id number] 10/11/21 ?0404 10/12/21 ?[id number] 10/13/21 ?[id number] 10/14/21 ?[id number]  ?WBC 13.5* 12.6* 10.5 10.2 10.4  ?NEUTROABS 11.3* 11.0* 8.2* 7.0 6.6  ?HGB 13.1 11.3* 12.0 12.4 12.9  ?HCT 38.4 32.9* 34.9* 36.9 37.5  ?MCV 115.7* 114.2* 114.1* 113.5* 112.6*  ?PLT 192 179 234 289 289  ? ?Basic Metabolic Panel: ?Recent Labs  ?Lab 10/10/21 ?[id number] 10/11/21 ?0404 10/12/21 ?[id number] 10/13/21 ?[id number] 10/14/21 ?[id number]  ?NA 136 139 138 139 139  ?K 4.5 3.9 4.0 4.3 4.5  ?CL 94* 97* 101 102 103  ?CO2 30 28 28 27 27   ?GLUCOSE 192* 229* 205* 184* 168*  ?BUN 34* 48* 58* 71* 72*  ?CREATININE 2.03* 2.37* 2.46*  2.52* 2.30*  ?CALCIUM 8.3* 8.1* 8.3* 8.6*  8.6* 8.6*  ?PHOS  --   --   --  3.5  --   ? ?GFR: ?Estimated Creatinine Clearance: 14.9 mL/min (A) (by C-G formula based on SCr of 2.3 mg/dL (H)). ?Liver Function Tests: ?Recent Labs  ?Lab 10/09/21 ?1345  ?AST 28  ?ALT 7  ?ALKPHOS 69  ?BILITOT 0.9  ?PROT 6.8  ?ALBUMIN 3.4*  ? ?No results for input(s): LIPASE, AMYLASE in the last 168 hours. ?No results for input(s): AMMONIA in the last 168 hours. ?Coagulation Profile: ?No results for input(s): INR, PROTIME in the last 168 hours. ?Cardiac Enzymes: ?No results for input(s): CKTOTAL, CKMB, CKMBINDEX,  TROPONINI in the last 168 hours. ?BNP (last 3 results) ?No results for input(s): PROBNP in the last 8760 hours. ?HbA1C: ?No results for input(s): HGBA1C in the last 72 hours. ?CBG: ?Recent Labs  ?Lab 10/14/21 ?0748 10/14/21 ?1131 10/14/21 ?1611 10/14/21 ?2118 10/15/21 ?0733  ?GLUCAP 183* 153* 163* 128* 130*  ? ?Lipid Profile: ?No results for input(s): CHOL, HDL, LDLCALC, TRIG, CHOLHDL, LDLDIRECT in the last 72 hours. ?Thyroid Function Tests: ?No results for input(s): TSH, T4TOTAL, FREET4, T3FREE, THYROIDAB in the last 72 hours. ?Anemia Panel: ?No results for input(s): VITAMINB12, FOLATE, FERRITIN, TIBC, IRON, RETICCTPCT in the last 72 hours. ?Sepsis Labs: ?Recent Labs  ?Lab 10/09/21 ?1345 10/09/21 ?1542 10/09/21 ?1700 10/10/21 ?0416 10/10/21 ?1722  ?PROCALCITON  --   --  <0.10 0.11  --   ?LATICACIDVEN 1.5 1.2  --   --  1.5  ? ? ?Recent Results (from the past 240 hour(s))  ?Resp Panel by RT-PCR (Flu A&B, Covid) Nasopharyngeal Swab     Status: None  ? Collection Time: 10/09/21  1:45 PM  ? Specimen: Nasopharyngeal Swab; Nasopharyngeal(NP) swabs in vial transport medium  ?Result Value Ref Range Status  ? SARS Coronavirus 2 by RT PCR NEGATIVE NEGATIVE Final  ?  Comment: (NOTE) ?SARS-CoV-2 target nucleic acids are NOT DETECTED. ? ?The SARS-CoV-2 RNA is generally detectable in upper respiratory ?specimens during the acute phase of infection. The lowest ?concentration of SARS-CoV-2 viral copies this assay can detect is ?138 copies/mL. A negative result does not preclude SARS-Cov-2 ?infection and should not be used as the sole basis for treatment or ?other patient management decisions. A negative result may occur with  ?improper specimen collection/handling, submission of specimen other ?than nasopharyngeal swab, presence of viral mutation(s) within the ?areas targeted by this assay, and inadequate number of viral ?copies(<138 copies/mL). A negative result must be combined with ?clinical observations, patient history, and  epidemiological ?information. The expected result is Negative. ? ?Fact Sheet for Patients:  ?BloggerCourse.com ? ?Fact Sheet for Healthcare Providers:  ?SeriousBroker.it ? ?This test is no t yet approved or cleared by the [place] FDA and  ?has been authorized for detection and/or diagnosis of SARS-CoV-2 by ?FDA under an Emergency Use Authorization (EUA). This EUA will remain  ?in effect (meaning this test can be used) for the duration of the ?COVID-19 declaration under Section 564(b)(1) of the Act, 21 ?U.S.C.section 360bbb-3(b)(1), unless the authorization is terminated  ?or revoked sooner.  ? ? ?  ? Influenza A by PCR NEGATIVE NEGATIVE Final  ? Influenza B by PCR NEGATIVE NEGATIVE Final  ?  Comment: (NOTE) ?The Xpert Xpress SARS-CoV-2/FLU/RSV plus assay is intended as an aid ?in the diagnosis of influenza from Nasopharyngeal swab specimens and ?should not be used as a sole basis for treatment. Nasal washings and ?aspirates are unacceptable for  Xpert Xpress SARS-CoV-2/FLU/RSV ?testing. ? ?Fact Sheet for Patients: ?EntrepreneurPulse.com.au ? ?Fact Sheet for Healthcare Providers: ?IncredibleEmployment.be ? ?This test is not yet approved or cleared by the [place] FDA and ?has been authorized for detection and/or diagnosis of SARS-CoV-2 by ?FDA under an Emergency Use Authorization (EUA). This EUA will remain ?in effect (meaning this test can be used) for the duration of the ?COVID-19 declaration under Section 564(b)(1) of the Act, 21 U.S.C. ?section 360bbb-3(b)(1), unless the authorization is terminated or ?revoked. ? ?Performed at [facility], [place], ?[place] [address] ?  ?Blood Culture (routine x 2)     Status: None  ? Collection Time: 10/09/21  1:45 PM  ? Specimen: BLOOD RIGHT HAND  ?Result Value Ref Range Status  ? Specimen Description BLOOD RIGHT HAND  Final  ? Special Requests   Final  ?   BOTTLES DRAWN AEROBIC AND ANAEROBIC Blood Culture adequate volume  ? Culture   Final  ?  NO GROWTH 5 DAYS ?Performed at [facility], [address], [place], [place] [address] ?  ? Report Status 05/1

## 2021-10-15 NOTE — TOC Progression Note (Signed)
Transition of Care (TOC) - Progression Note  ? ? ?Patient Details  ?Name: Renee Carpenter ?MRN: [id number] ?Date of Birth: 1925-12-21 ? ?Transition of Care (TOC) CM/SW Contact  ?Margarito Liner, LCSW ?Phone Number: ?10/15/2021, 3:58 PM ? ?Clinical Narrative:  Uploaded clinicals into [facility] portal to start insurance authorization for SNF. PASARR under manual review. Uploaded clinicals into [facility] for review.  ? ?Expected Discharge Plan and Services ?  ?  ?  ?  ?  ?                ?  ?  ?  ?  ?  ?  ?  ?  ?  ?  ? ? ?Social Determinants of Health (SDOH) Interventions ?  ? ?Readmission Risk Interventions ?   ? View : No data to display.  ?  ?  ?  ? ? ?

## 2021-10-15 NOTE — Progress Notes (Signed)
Occupational Therapy Treatment ?Patient Details ?Name: Renee Carpenter ?MRN: [id number] ?DOB: 1925/12/13 ?Today's Date: 10/15/2021 ? ? ?History of present illness Pt is a 88 y/o F admitted on 10/09/21 after presenting with c/o worsening SOB associated with fatigue & AMS. Pt also c/o lower abdominal pain & urine draw was concerning for UTI. Pt is being treated for acute decompensated systolic CHF, acute hypoxic respiratory failure, & sepsis 2/2 UTI. PMH: systolic CHF, CKD stage 3A, DM2, parkinsonism, OSA, HTN ?  ?OT comments ? Pt seen for skilled co-tx with PT. Pt needing increased encouragement to participate in therapy session. Mod A for supine >sit with min A for static sitting balance with posterior bias. Pt sitting EOB ~ 10 minutes and visitor brushing her hair. Pt stands x 2 reps with +2 assistance. Pt was able to initiate more upright standing balance with cuing. Pt standing ~ 30 minutes- 1 minute each time. Pt returning to bed secondary to fatigue with +2 assistance. Pt continues to benefit from OT intervention with recommendation for short term rehab to address functional deficits before returning home.   ? ?Recommendations for follow up therapy are one component of a multi-disciplinary discharge planning process, led by the attending physician.  Recommendations may be updated based on patient status, additional functional criteria and insurance authorization. ?   ?Follow Up Recommendations ? Skilled nursing-short term rehab (<3 hours/day)  ?  ?Assistance Recommended at Discharge Intermittent Supervision/Assistance  ?Patient can return home with the following ? A lot of help with bathing/dressing/bathroom;A lot of help with walking and/or transfers;Assistance with cooking/housework;Assist for transportation ?  ?Equipment Recommendations ? None recommended by OT  ?  ?   ?Precautions / Restrictions Precautions ?Precautions: Fall ?Precaution Comments: monitor vitals ?Restrictions ?Weight Bearing Restrictions: No   ? ? ?  ? ?Mobility Bed Mobility ?Overal bed mobility: Needs Assistance ?Bed Mobility: Supine to Sit, Sit to Supine ?  ?  ?Supine to sit: Mod assist ?Sit to supine: Mod assist, +2 for physical assistance ?  ?General bed mobility comments: verbal cues for technique and sequencing. extra time and effort required with bed mobility ?  ? ?Transfers ?Overall transfer level: Needs assistance ?Equipment used: Rolling walker (2 wheels) ?Transfers: Sit to/from Stand ?Sit to Stand: Mod assist, +2 physical assistance, Min assist ?  ?  ?  ?  ?  ?General transfer comment: faciliation for anterior weight shifting and foot placement. 2 standing bouts performed. increased assistance required with first standing bout and increased effort and independence with second standing bout. ?  ?  ?Balance Overall balance assessment: Needs assistance ?Sitting-balance support: Feet supported, Bilateral upper extremity supported ?Sitting balance-Leahy Scale: Fair ?Sitting balance - Comments: periods of minimal assistance initially for posterior trunk support progressing to min guard with increased sitting time ?  ?Standing balance support: Bilateral upper extremity supported ?Standing balance-Leahy Scale: Fair ?Standing balance comment: faciliation and cues for upright standing posture. Min A +2 required for standing balance with posterior lean. standing tolerance of less than one minute ?  ?  ?  ?  ?  ?  ?  ?  ?  ?  ?  ?   ? ?ADL either performed or assessed with clinical judgement  ? ? ?Extremity/Trunk Assessment Upper Extremity Assessment ?Upper Extremity Assessment: Generalized weakness ?  ?Lower Extremity Assessment ?Lower Extremity Assessment: Generalized weakness ?  ?  ?  ? ?Vision Patient Visual Report: No change from baseline ?  ?  ?   ?   ? ?Cognition  Arousal/Alertness: Awake/alert ?Behavior During Therapy: [facility] for tasks assessed/performed ?Overall Cognitive Status: Within Functional Limits for tasks assessed ?  ?  ?  ?  ?  ?  ?  ?  ?   ?  ?  ?  ?  ?  ?  ?  ?General Comments: patient drowsy initially but was agreeable to PT with encourgement. she is hard of hearing and needs extra time to follow commands ?  ?  ?   ?   ?   ?General Comments heart rate in the 60's with activity.  ? ? ?Pertinent Vitals/ Pain       Pain Assessment ?Pain Assessment: No/denies pain ? ?   ?   ? ?Frequency ? Min 2X/week  ? ? ? ? ?  ?Progress Toward Goals ? ?OT Goals(current goals can now be found in the care plan section) ? Progress towards OT goals: Progressing toward goals ? ?Acute Rehab OT Goals ?Patient Stated Goal: to get better and go home ?OT Goal Formulation: With patient ?Time For Goal Achievement: 10/25/21  ?Plan Discharge plan remains appropriate;Frequency remains appropriate   ? ?Co-evaluation ? ? ? PT/OT/SLP Co-Evaluation/Treatment: Yes ?Reason for Co-Treatment: To address functional/ADL transfers;For patient/therapist safety ?PT goals addressed during session: Mobility/safety with mobility ?OT goals addressed during session: ADL's and self-care ?  ? ?  ?AM-PAC OT "6 Clicks" Daily Activity     ?Outcome Measure ? ? Help from another person eating meals?: A Little ?Help from another person taking care of personal grooming?: A Little ?Help from another person toileting, which includes using toliet, bedpan, or urinal?: A Lot ?Help from another person bathing (including washing, rinsing, drying)?: A Lot ?Help from another person to put on and taking off regular upper body clothing?: A Little ?Help from another person to put on and taking off regular lower body clothing?: A Lot ?6 Click Score: 15 ? ?  ?End of Session   ? ?OT Visit Diagnosis: Other abnormalities of gait and mobility (R26.89);Muscle weakness (generalized) (M62.81) ?  ?Activity Tolerance Patient tolerated treatment well ?  ?Patient Left in bed;with bed alarm set;with call bell/phone within reach;with family/visitor present ?  ?Nurse Communication Mobility status ?  ? ?   ? ?Time: [id number] ?OT Time  Calculation (min): 23 min ? ?Charges: OT General Charges ?$OT Visit: 1 Visit ?OT Treatments ?$Therapeutic Activity: 8-22 mins ? ?Darleen Crocker, MS, OTR/L , CBIS ?ascom [phone]  ?10/15/21, 3:49 PM  ?

## 2021-10-16 ENCOUNTER — Other Ambulatory Visit: Admission: RE | Admit: 2021-10-16 | Payer: Medicare PPO | Source: Ambulatory Visit

## 2021-10-16 ENCOUNTER — Ambulatory Visit: Payer: Medicare PPO | Admitting: Adult Health

## 2021-10-16 DIAGNOSIS — I509 Heart failure, unspecified: Secondary | ICD-10-CM | POA: Diagnosis not present

## 2021-10-16 LAB — CBC WITH DIFFERENTIAL/PLATELET
Abs Immature Granulocytes: 0.4 10*3/uL — ABNORMAL HIGH (ref 0.00–0.07)
Basophils Absolute: 0.1 10*3/uL (ref 0.0–0.1)
Basophils Relative: 1 %
Eosinophils Absolute: 0.3 10*3/uL (ref 0.0–0.5)
Eosinophils Relative: 2 %
HCT: 35.3 % — ABNORMAL LOW (ref 36.0–46.0)
Hemoglobin: 12 g/dL (ref 12.0–15.0)
Lymphocytes Relative: 23 %
Lymphs Abs: 3.2 10*3/uL (ref 0.7–4.0)
MCH: 38.3 pg — ABNORMAL HIGH (ref 26.0–34.0)
MCHC: 34 g/dL (ref 30.0–36.0)
MCV: 112.8 fL — ABNORMAL HIGH (ref 80.0–100.0)
Metamyelocytes Relative: 3 %
Monocytes Absolute: 1.2 10*3/uL — ABNORMAL HIGH (ref 0.1–1.0)
Monocytes Relative: 9 %
Neutro Abs: 8.5 10*3/uL — ABNORMAL HIGH (ref 1.7–7.7)
Neutrophils Relative %: 62 %
Platelets: 273 10*3/uL (ref 150–400)
RBC: 3.13 MIL/uL — ABNORMAL LOW (ref 3.87–5.11)
RDW: 18.5 % — ABNORMAL HIGH (ref 11.5–15.5)
WBC: 13.7 10*3/uL — ABNORMAL HIGH (ref 4.0–10.5)
nRBC: 0.8 % — ABNORMAL HIGH (ref 0.0–0.2)
nRBC: 2 /100 WBC — ABNORMAL HIGH

## 2021-10-16 LAB — BRAIN NATRIURETIC PEPTIDE: B Natriuretic Peptide: 1780.5 pg/mL — ABNORMAL HIGH (ref 0.0–100.0)

## 2021-10-16 LAB — GLUCOSE, CAPILLARY
Glucose-Capillary: 101 mg/dL — ABNORMAL HIGH (ref 70–99)
Glucose-Capillary: 164 mg/dL — ABNORMAL HIGH (ref 70–99)
Glucose-Capillary: 192 mg/dL — ABNORMAL HIGH (ref 70–99)
Glucose-Capillary: 211 mg/dL — ABNORMAL HIGH (ref 70–99)
Glucose-Capillary: 258 mg/dL — ABNORMAL HIGH (ref 70–99)
Glucose-Capillary: 66 mg/dL — ABNORMAL LOW (ref 70–99)

## 2021-10-16 LAB — CREATININE, SERUM
Creatinine, Ser: 1.94 mg/dL — ABNORMAL HIGH (ref 0.44–1.00)
GFR, Estimated: 24 mL/min — ABNORMAL LOW (ref >60)

## 2021-10-16 MED ORDER — FUROSEMIDE 20 MG PO TABS
20.0000 mg | ORAL_TABLET | Freq: Every day | ORAL | Status: DC
  Administered 2021-10-16 – 2021-10-18 (×3): 20 mg via ORAL
  Filled 2021-10-16 (×3): qty 1

## 2021-10-16 MED ORDER — INSULIN ASPART 100 UNIT/ML IJ SOLN
0.0000 [IU] | Freq: Three times a day (TID) | INTRAMUSCULAR | Status: DC
  Administered 2021-10-16: 5 [IU] via SUBCUTANEOUS
  Administered 2021-10-17: 3 [IU] via SUBCUTANEOUS
  Administered 2021-10-17: 2 [IU] via SUBCUTANEOUS
  Administered 2021-10-17 – 2021-10-18 (×2): 3 [IU] via SUBCUTANEOUS
  Administered 2021-10-18: 2 [IU] via SUBCUTANEOUS
  Administered 2021-10-18: 3 [IU] via SUBCUTANEOUS
  Administered 2021-10-19: 1 [IU] via SUBCUTANEOUS
  Administered 2021-10-19: 3 [IU] via SUBCUTANEOUS
  Filled 2021-10-16 (×7): qty 1

## 2021-10-16 NOTE — TOC Progression Note (Signed)
Transition of Care (TOC) - Progression Note  ? ? ?Patient Details  ?Name: Renee Carpenter ?MRN: [id number] ?Date of Birth: Feb 12, 1926 ? ?Transition of Care (TOC) CM/SW Contact  ?Laurena Slimmer, RN ?Phone Number: ?10/16/2021, 10:21 AM ? ?Clinical Narrative:    ?Attempt to contact [place] page in admissions at [facility]. [place] is OON. Patient may have higher out of pocket cost. Left message requesting return call to this CM. ? ? ?  ?  ? ?Expected Discharge Plan and Services ?  ?  ?  ?  ?  ?                ?  ?  ?  ?  ?  ?  ?  ?  ?  ?  ? ? ?Social Determinants of Health (SDOH) Interventions ?  ? ?Readmission Risk Interventions ?   ? View : No data to display.  ?  ?  ?  ? ? ?

## 2021-10-16 NOTE — Progress Notes (Addendum)
Renee Carpenter  MRN: [id number]  DOB/AGE: [birth year] 88 y.o.  Primary Care Physician:Gilbert, Leonette Monarch., MD  Admit date: 10/09/2021  Chief Complaint:  Chief Complaint  Patient presents with   Shortness of Breath   Fever    S-Pt presented on  10/09/2021 with  Chief Complaint  Patient presents with   Shortness of Breath   Fever  .   Patient is a 88 year old Caucasian female with a past medical history of chronic systolic CHF with ejection fraction of around 25 to 30%, CKD stage IIIb, diabetes mellitus type 2, Parkinson's, obstructive sleep apnea, hypertension who was brought to the ER with chief complaint of shortness of breath.  Patient was laying uncomfortably on the bed.  Medications  carbidopa-levodopa  1 tablet Oral TID   Chlorhexidine Gluconate Cloth  6 each Topical Daily   DULoxetine  30 mg Oral Daily   furosemide  20 mg Oral Daily   gabapentin  200 mg Oral Q8H   heparin injection (subcutaneous)  5,000 Units Subcutaneous Q8H   insulin aspart  0-9 Units Subcutaneous TID WC   metoprolol tartrate  12.5 mg Oral BID   pantoprazole sodium  40 mg Oral Daily   rOPINIRole  0.5 mg Oral TID   sodium chloride flush  3 mL Intravenous Q12H         ROS: Confused unable to offer but history  Physical Exam: Vital signs in last 24 hours: Temp:  [97.5 F (36.4 C)-98.7 F (37.1 C)] 97.7 F (36.5 C) (05/12 1630) Pulse Rate:  [60-78] 70 (05/12 1630) Resp:  [18-19] 19 (05/12 1630) BP: (96-131)/(54-84) 124/55 (05/12 1630) SpO2:  [92 %-98 %] 94 % (05/12 1630) Weight:  [67.9 kg-68.2 kg] 67.9 kg (05/12 1016) Weight change:  Last BM Date : 10/14/21  Intake/Output from previous day: 05/11 0701 - 05/12 0700 In: 960 [P.O.:960] Out: 2500 [Urine:2500] No intake/output data recorded.   Physical Exam:  General- pt is confused does occasionally follows commands Resp- No acute REsp distress,  Rhonchi+, Decreased breath sounds at bases CVS- S1S2 regular in rate and  rhythm GIT- BS+, soft, NT, ND EXT- NO LE Edema, Cyanosis CNS- CN 2-12 grossly intact. Moving all 4 extremities  Lab Results:  CBC  Recent Labs    10/15/21 1117 10/16/21 0428  WBC 13.4* 13.7*  HGB 13.8 12.0  HCT 39.4 35.3*  PLT 215 273    BMET  Recent Labs    10/14/21 0832 10/15/21 0930 10/16/21 0428  NA 139 138  --   K 4.5 4.2  --   CL 103 103  --   CO2 27 25  --   GLUCOSE 168* 174*  --   BUN 72* 65*  --   CREATININE 2.30* 2.13* 1.94*  CALCIUM 8.6* 8.4*  --       Most recent Creatinine trend  Lab Results  Component Value Date   CREATININE 1.94 (H) 10/16/2021   CREATININE 2.13 (H) 10/15/2021   CREATININE 2.30 (H) 10/14/2021      MICRO   Recent Results (from the past 240 hour(s))  Resp Panel by RT-PCR (Flu A&B, Covid) Nasopharyngeal Swab     Status: None   Collection Time: 10/09/21  1:45 PM   Specimen: Nasopharyngeal Swab; Nasopharyngeal(NP) swabs in vial transport medium  Result Value Ref Range Status   SARS Coronavirus 2 by RT PCR NEGATIVE NEGATIVE Final    Comment: (NOTE) SARS-CoV-2 target nucleic acids are NOT DETECTED.  The SARS-CoV-2 RNA is generally detectable  in upper respiratory specimens during the acute phase of infection. The lowest concentration of SARS-CoV-2 viral copies this assay can detect is 138 copies/mL. A negative result does not preclude SARS-Cov-2 infection and should not be used as the sole basis for treatment or other patient management decisions. A negative result may occur with  improper specimen collection/handling, submission of specimen other than nasopharyngeal swab, presence of viral mutation(s) within the areas targeted by this assay, and inadequate number of viral copies(<138 copies/mL). A negative result must be combined with clinical observations, patient history, and epidemiological information. The expected result is Negative.  Fact Sheet for Patients:  BloggerCourse.com  Fact Sheet  for Healthcare Providers:  SeriousBroker.it  This test is no t yet approved or cleared by the [place] FDA and  has been authorized for detection and/or diagnosis of SARS-CoV-2 by FDA under an Emergency Use Authorization (EUA). This EUA will remain  in effect (meaning this test can be used) for the duration of the COVID-19 declaration under Section 564(b)(1) of the Act, 21 U.S.C.section 360bbb-3(b)(1), unless the authorization is terminated  or revoked sooner.       Influenza A by PCR NEGATIVE NEGATIVE Final   Influenza B by PCR NEGATIVE NEGATIVE Final    Comment: (NOTE) The Xpert Xpress SARS-CoV-2/FLU/RSV plus assay is intended as an aid in the diagnosis of influenza from Nasopharyngeal swab specimens and should not be used as a sole basis for treatment. Nasal washings and aspirates are unacceptable for Xpert Xpress SARS-CoV-2/FLU/RSV testing.  Fact Sheet for Patients: BloggerCourse.com  Fact Sheet for Healthcare Providers: SeriousBroker.it  This test is not yet approved or cleared by the [place] FDA and has been authorized for detection and/or diagnosis of SARS-CoV-2 by FDA under an Emergency Use Authorization (EUA). This EUA will remain in effect (meaning this test can be used) for the duration of the COVID-19 declaration under Section 564(b)(1) of the Act, 21 U.S.C. section 360bbb-3(b)(1), unless the authorization is terminated or revoked.  Performed at [facility], [address] [place], [place] [address]   Blood Culture (routine x 2)     Status: None   Collection Time: 10/09/21  1:45 PM   Specimen: BLOOD RIGHT HAND  Result Value Ref Range Status   Specimen Description BLOOD RIGHT HAND  Final   Special Requests   Final    BOTTLES DRAWN AEROBIC AND ANAEROBIC Blood Culture adequate volume   Culture   Final    NO GROWTH 5 DAYS Performed at [facility], [address], [place], [place] [address]    Report Status 10/14/2021 FINAL  Final  Blood Culture (routine x 2)     Status: None   Collection Time: 10/09/21  1:45 PM   Specimen: BLOOD  Result Value Ref Range Status   Specimen Description BLOOD LEFT ANTECUBITAL  Final   Special Requests   Final    BOTTLES DRAWN AEROBIC AND ANAEROBIC Blood Culture adequate volume   Culture   Final    NO GROWTH 5 DAYS Performed at [facility], [address], [place], [place] [address]    Report Status 10/14/2021 FINAL  Final  MRSA Next Gen by PCR, Nasal     Status: None   Collection Time: 10/10/21  9:21 AM   Specimen: Nasal Mucosa; Nasal Swab  Result Value Ref Range Status   MRSA by PCR Next Gen NOT DETECTED NOT DETECTED Final    Comment: (NOTE) The GeneXpert MRSA Assay (FDA approved for NASAL  specimens only), is one component of a comprehensive MRSA colonization surveillance program. It is not intended to diagnose MRSA infection nor to guide or monitor treatment for MRSA infections. Test performance is not FDA approved in patients less than 55 years old. Performed at [facility], [address], [place], [place] [address]      Renal ultrasound done on August 12, 2021 was reviewed FINDINGS: Right Kidney:   Renal measurements: 10 x 4.2 x 3.8 cm = volume: 83 mL. The right kidney demonstrates cortical atrophy and increased cortical echogenicity. No hydronephrosis or focal lesion identified.   Left Kidney:   Renal measurements: 9.2 x 4.4 x 3.5 cm = volume: 75 mL. The left kidney demonstrates increased cortical echogenicity. No hydronephrosis or focal lesion identified.   Bladder:   The bladder is decompressed by a Foley catheter.   Other:   None.   IMPRESSION: Increased cortical echogenicity of both kidneys is consistent with underlying chronic kidney disease. No hydronephrosis.       Impression:  1)Renal   Acute kidney injury AKI secondary to ATN AKI  secondary to multiple factors AKI secondary to UTI/sepsis AKI secondary to hypotension as patient has SBP of less than 80 recently AKI secondary to medications as patient was recently started on Jardiance AKI secondary to cardiorenal syndrome   Patient has AKI on CKD Patient has CKD stage IIIb Patient has CKD age associated decline As patient is 88 years old Patient has CKD since 2016 Patient has CKD secondary to diabetes mellitus     AKI is better. Creatinine is now trending down  Will follow closely    Creatinine trend 2023 1.4==>2.5==>2.1==>1.9AKI 2022 1.4--1.6 2021  1.2--1.3 2020  1.6 2019   1.3 2018   0.9--1.1 2017  1.2--1.4 2016  1.5       2)Hypotension Patient blood pressure was low earlier Patient blood pressure is now better   3)Anemia of chronic disease     Latest Ref Rng & Units 10/16/2021    4:28 AM 10/15/2021   11:17 AM 10/15/2021    7:13 AM  CBC  WBC 4.0 - 10.5 K/uL 13.7   13.4   16.4    Hemoglobin 12.0 - 15.0 g/dL 91.4   78.2   95.6    Hematocrit 36.0 - 46.0 % 35.3   39.4   38.6    Platelets 150 - 400 K/uL 273   215   258      Patient hemoglobin was earlier at 11.3, now better     4) Secondary hyperparathyroidism -CKD Mineral-Bone Disorder    Lab Results  Component Value Date   PTH 40 10/13/2021   PTH Comment 10/13/2021   CALCIUM 8.4 (L) 10/15/2021   PHOS 2.9 10/15/2021    Secondary Hyperparathyroidism  absent  Phosphorus at goal.  5) acute on chronic systolic CHF  Patient has history of acute on chronic systolic CHF Patient diuretics were on hold Today on examination patient had more  rhonchi on examination Agree with need for dose of diuretics   6) Electrolytes      Latest Ref Rng & Units 10/16/2021    4:28 AM 10/15/2021    9:30 AM 10/14/2021    8:32 AM  BMP  Glucose 70 - 99 mg/dL  213   086    BUN 8 - 23 mg/dL  65   72    Creatinine 0.44 - 1.00 mg/dL 5.78   4.69   6.29    Sodium 135 -  145 mmol/L  138   139     Potassium 3.5 - 5.1 mmol/L  4.2   4.5    Chloride 98 - 111 mmol/L  103   103    CO2 22 - 32 mmol/L  25   27    Calcium 8.9 - 10.3 mg/dL  8.4   8.6       Sodium Normonatremic   Potassium Normokalemic    7)Acid base   Co2 at goal  8)UTI/sepsis Patient is currently on antibiotics  9)Acute hypoxic respiratory failure Patient is clinically stable  Plan:  Patient admitted with AKI, CKD stage IIIb, sepsis secondary to UTI, acute hypoxic respiratory failure, acute on chronic systolic CHF I reviewed patient's labs and chest x-ray Patient creatinine is improving Agree with need for dose of diuretics We will continue the current treatment    Betsie Peckman s Dalbert Stillings 10/16/2021, 5:22 PM

## 2021-10-16 NOTE — Progress Notes (Signed)
Inpatient Diabetes Program Recommendations ? ?AACE/ADA: New Consensus Statement on Inpatient Glycemic Control (2015) ? ?Target Ranges:  Prepandial:   less than 140 mg/dL ?     Peak postprandial:   less than 180 mg/dL (1-2 hours) ?     Critically ill patients:  140 - 180 mg/dL  ? ?Lab Results  ?Component Value Date  ? GLUCAP 192 (H) 10/16/2021  ? HGBA1C 7.7 (H) 07/21/2021  ? ? ?Review of Glycemic Control ? Latest Reference Range & Units 10/15/21 20:43 10/16/21 00:03 10/16/21 00:05 10/16/21 08:06  ?Glucose-Capillary 70 - 99 mg/dL 492 (H) 66 (L) 010 (H) 164 (H)  ? ?Diabetes history: DM 2 ?Outpatient Diabetes medications:  ?Jardiance 10 mg daily ?Amaryl 4 mg daily ?Current orders for Inpatient glycemic control:  ?Novolog moderate tid with meals and HS ? ?Inpatient Diabetes Program Recommendations:   ? ?Consider reducing Novolog to sensitive tid with meals.  ? ?Thanks,  ?Beryl Meager, RN, BC-ADM ?Inpatient Diabetes Coordinator ?Pager [phone]  (8a-5p) ? ?

## 2021-10-16 NOTE — Progress Notes (Addendum)
[facility] Liaison Note ? ?Notified by MD/Dr. Kurtis Bushman of patient/family request of [facility] hospice services. However, after further discussion with friend/Billy, he prefers for PMT to follow through rehab and switch to hospice at a later time. ? ?[facility] hospital liaison will follow patient for discharge disposition.  ? ?Please call with any questions/concerns.  ?  ?Thank you for the opportunity to participate in this patient's care. ?  ?Daphene Calamity, MSW ?[facility] Liaison  ?[phone] ? ? ?

## 2021-10-16 NOTE — Progress Notes (Signed)
?PROGRESS NOTE ? ? ? ?Renee Carpenter  [id number] DOB: 08-25-1925 DOA: 10/09/2021 ?PCP: Jerrol Banana., MD  ? ? ?Brief Narrative:  ?88 y.o. female with medical history significant of systolic congestive heart failure EF 25 to 30%, CKD stage IIIa, type 2 diabetes mellitus, parkinsonism, obstructive sleep apnea, hypertension presents for evaluation of progressively worsening shortness of breath associated with fatigue and altered mentation.  [name] is compliance with all medications.  No abdominal pain ?  ?No home oxygen requirement.  On presentation brought in by EMS on 4 L nasal cannula after being found hypoxic at home.  Patient is a poor historian.  History obtained from multiple sources including the patient, patient's husband, ER physician, ER documentation. ?  ?On my evaluation patient is uncomfortable appearing.  Difficult to obtain history from.  Per husband at bedside patient is complaining of lower abdominal pain.  Urine drawn on admission is concerning for urinary tract infection although patient is afebrile with normal lactic acidosis and no leukocytosis.  Patient was febrile with Tmax 102.4.  Patient has lab work including elevated BNP and chest x-ray that demonstrates evidence of interstitial edema. ?  ?Alternates between agitated and lethargic.  Mental status remains poor.  No change in oxygen requirement.  Breakthrough fevers noted ?  ?Acutely hypotensive 5/6.  Midodrine, stress dose steroids, albumin administered.  Blood pressure responded.  On 5/7 much more awake.  Remains on 4 L O2. ?  ?5/9.  Shock physiology improved.  Midodrine weaned off with stabilization of blood pressure ?  ? ?5/10 creatinine down mildly.  ?5/11 no overnight issues ?5/12 updated her POA friend . ? ? ? ?Consultants:  ?Cardiology, nephrology ? ?Procedures:  ? ?Antimicrobials:  ?  ? ? ?Subjective: ?Pt with it this am. No sob, cp, dizziness. Responses appropriately ? ?Objective: ?Vitals:  ? 10/15/21 2230  10/16/21 0147 10/16/21 [id number] 10/16/21 0804  ?BP:  (!) 118/54 129/65 122/70  ?Pulse:  60 70 74  ?Resp:   18 18  ?Temp:  97.7 ?F (36.5 ?C) 97.8 ?F (36.6 ?C) 97.8 ?F (36.6 ?C)  ?TempSrc:  Oral Oral   ?SpO2: 94% 97% 98% 97%  ?Weight:      ?Height:      ? ? ?Intake/Output Summary (Last 24 hours) at 10/16/2021 0834 ?Last data filed at 10/16/2021 [phone] ?Gross per 24 hour  ?Intake 960 ml  ?Output 2500 ml  ?Net -1540 ml  ? ?Filed Weights  ? 10/13/21 0449 10/14/21 0946 10/15/21 0618  ?Weight: 70.2 kg 68 kg 69.1 kg  ? ? ?Examination: ?Calm, NAD ?Cta no w/r ?Reg s1/s2 no gallop ?Soft benign +bs ?No edema ?Aaoxox3  ?Mood and affect appropriate in current setting  ? ? ? ?Data Reviewed: I have personally reviewed following labs and imaging studies ? ?CBC: ?Recent Labs  ?Lab 10/12/21 ?0326 10/13/21 ?[id number] 10/14/21 ?[id number] 10/15/21 ?[id number] 10/15/21 ?1117 10/16/21 ?0428  ?WBC 10.5 10.2 10.4 16.4* 13.4* 13.7*  ?NEUTROABS 8.2* 7.0 6.6 8.7*  --  8.5*  ?HGB 12.0 12.4 12.9 13.3 13.8 12.0  ?HCT 34.9* 36.9 37.5 38.6 39.4 35.3*  ?MCV 114.1* 113.5* 112.6* 115.6* 121.2* 112.8*  ?PLT 234 289 289 258 215 273  ? ?Basic Metabolic Panel: ?Recent Labs  ?Lab 10/11/21 ?0404 10/12/21 ?0326 10/13/21 ?[id number] 10/14/21 ?[id number] 10/15/21 ?0930 10/16/21 ?0428  ?NA 139 138 139 139 138  --   ?K 3.9 4.0 4.3 4.5 4.2  --   ?CL 97* 101 102 103 103  --   ?CO2  28 28 27 27 25   --   ?GLUCOSE 229* 205* 184* 168* 174*  --   ?BUN 48* 58* 71* 72* 65*  --   ?CREATININE 2.37* 2.46* 2.52* 2.30* 2.13* 1.94*  ?CALCIUM 8.1* 8.3* 8.6*  8.6* 8.6* 8.4*  --   ?PHOS  --   --  3.5  --  2.9  --   ? ?GFR: ?Estimated Creatinine Clearance: 17.7 mL/min (A) (by C-G formula based on SCr of 1.94 mg/dL (H)). ?Liver Function Tests: ?Recent Labs  ?Lab 10/09/21 ?1345 10/15/21 ?0930  ?AST 28  --   ?ALT 7  --   ?ALKPHOS 69  --   ?BILITOT 0.9  --   ?PROT 6.8  --   ?ALBUMIN 3.4* 2.9*  ? ?No results for input(s): LIPASE, AMYLASE in the last 168 hours. ?No results for input(s): AMMONIA in the last 168  hours. ?Coagulation Profile: ?No results for input(s): INR, PROTIME in the last 168 hours. ?Cardiac Enzymes: ?No results for input(s): CKTOTAL, CKMB, CKMBINDEX, TROPONINI in the last 168 hours. ?BNP (last 3 results) ?No results for input(s): PROBNP in the last 8760 hours. ?HbA1C: ?No results for input(s): HGBA1C in the last 72 hours. ?CBG: ?Recent Labs  ?Lab 10/15/21 ?1621 10/15/21 ?2043 10/16/21 ?0003 10/16/21 ?0005 10/16/21 ?[id number]  ?GLUCAP 255* 214* 66* 101* 164*  ? ?Lipid Profile: ?No results for input(s): CHOL, HDL, LDLCALC, TRIG, CHOLHDL, LDLDIRECT in the last 72 hours. ?Thyroid Function Tests: ?No results for input(s): TSH, T4TOTAL, FREET4, T3FREE, THYROIDAB in the last 72 hours. ?Anemia Panel: ?No results for input(s): VITAMINB12, FOLATE, FERRITIN, TIBC, IRON, RETICCTPCT in the last 72 hours. ?Sepsis Labs: ?Recent Labs  ?Lab 10/09/21 ?1345 10/09/21 ?1542 10/09/21 ?1700 10/10/21 ?0416 10/10/21 ?1722  ?PROCALCITON  --   --  <0.10 0.11  --   ?LATICACIDVEN 1.5 1.2  --   --  1.5  ? ? ?Recent Results (from the past 240 hour(s))  ?Resp Panel by RT-PCR (Flu A&B, Covid) Nasopharyngeal Swab     Status: None  ? Collection Time: 10/09/21  1:45 PM  ? Specimen: Nasopharyngeal Swab; Nasopharyngeal(NP) swabs in vial transport medium  ?Result Value Ref Range Status  ? SARS Coronavirus 2 by RT PCR NEGATIVE NEGATIVE Final  ?  Comment: (NOTE) ?SARS-CoV-2 target nucleic acids are NOT DETECTED. ? ?The SARS-CoV-2 RNA is generally detectable in upper respiratory ?specimens during the acute phase of infection. The lowest ?concentration of SARS-CoV-2 viral copies this assay can detect is ?138 copies/mL. A negative result does not preclude SARS-Cov-2 ?infection and should not be used as the sole basis for treatment or ?other patient management decisions. A negative result may occur with  ?improper specimen collection/handling, submission of specimen other ?than nasopharyngeal swab, presence of viral mutation(s) within the ?areas  targeted by this assay, and inadequate number of viral ?copies(<138 copies/mL). A negative result must be combined with ?clinical observations, patient history, and epidemiological ?information. The expected result is Negative. ? ?Fact Sheet for Patients:  ?EntrepreneurPulse.com.au ? ?Fact Sheet for Healthcare Providers:  ?IncredibleEmployment.be ? ?This test is no t yet approved or cleared by the [place] FDA and  ?has been authorized for detection and/or diagnosis of SARS-CoV-2 by ?FDA under an Emergency Use Authorization (EUA). This EUA will remain  ?in effect (meaning this test can be used) for the duration of the ?COVID-19 declaration under Section 564(b)(1) of the Act, 21 ?U.S.C.section 360bbb-3(b)(1), unless the authorization is terminated  ?or revoked sooner.  ? ? ?  ? Influenza A by  PCR NEGATIVE NEGATIVE Final  ? Influenza B by PCR NEGATIVE NEGATIVE Final  ?  Comment: (NOTE) ?The Xpert Xpress SARS-CoV-2/FLU/RSV plus assay is intended as an aid ?in the diagnosis of influenza from Nasopharyngeal swab specimens and ?should not be used as a sole basis for treatment. Nasal washings and ?aspirates are unacceptable for Xpert Xpress SARS-CoV-2/FLU/RSV ?testing. ? ?Fact Sheet for Patients: ?EntrepreneurPulse.com.au ? ?Fact Sheet for Healthcare Providers: ?IncredibleEmployment.be ? ?This test is not yet approved or cleared by the [place] FDA and ?has been authorized for detection and/or diagnosis of SARS-CoV-2 by ?FDA under an Emergency Use Authorization (EUA). This EUA will remain ?in effect (meaning this test can be used) for the duration of the ?COVID-19 declaration under Section 564(b)(1) of the Act, 21 U.S.C. ?section 360bbb-3(b)(1), unless the authorization is terminated or ?revoked. ? ?Performed at [facility], [place], ?[place] [address] ?  ?Blood Culture (routine x 2)     Status: None  ? Collection  Time: 10/09/21  1:45 PM  ? Specimen: BLOOD RIGHT HAND  ?Result Value Ref Range Status  ? Specimen Description BLOOD RIGHT HAND  Final  ? Special Requests   Final  ?  BOTTLES DRAWN AEROBIC AND ANAEROBIC Blood Culture adequate volum

## 2021-10-16 NOTE — Progress Notes (Signed)
[facility] ? ?CARDIOLOGY CONSULT NOTE  ?Patient ID: ?Belmar ?MRN: [id number] ?DOB/AGE: 1925-06-14 88 y.o. ? ?Admit date: 10/09/2021 ?Referring Physician Ralene Muskrat ?Primary Physician Jerrol Banana., MD ?Primary Cardiologist Nehemiah Massed ?Reason for Consultation AoCHF ? ?HPI:  ?Renee Carpenter is a 88 year old female with history of CHF (EF25-30%), chronic left bundle branch block, CKD 3, hypertension, hyperlipidemia was admitted with shortness of breath, fatigue, altered mental status.  She was discovered to have sepsis from a urinary source.  Cardiology is consulted for further assistance. ? ?Interval history: ?- renal function improving  ?- complains of issues swallowing, no chest pain or shortness of breath ?- laying at angle in bed ? ?Review of systems unable to assess due to patient's mental status ? ? ? ?Past Medical History:  ?Diagnosis Date  ? Anemia   ? WAS ANEMIC  ? Arthritis   ? Blood transfusion   ? HAD 1 SEVERAL YRS AGO...3-4 YR  ? CHF (congestive heart failure) ([place])   ? Chronic kidney disease   ? SOME DECREASE IN KIDNEY FUNCTION  ? Diabetes mellitus without complication ([place])   ? Hypertension   ? Normal cardiac stress test   ? REQUESTING [place]  ? Normal echocardiogram   ? REQUESTING THAT RESULT -- DR [name]   ? PONV (postoperative nausea and vomiting)   ? Shortness of breath   ? Sleep apnea   ? 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  ?Past Surgical History:  ?Procedure Laterality Date  ? ABDOMINAL HYSTERECTOMY    ? BACK SURGERY    ? 2 CERVICAL SURGERIES ALSO  ? CATARACTS    ? RIGHT EYE CATARACT  ? CERVICAL DISCECTOMY  08/1995 and 2011  ? C4-,C5-6, C6-7 anterior cervical; Dr. Ellene Route fusion and plating  ? LAMINECTOMY    ? decompressive; Dr. Burman Riis in [place] MICRODISCECTOMY  04/14/2011  ? Procedure: LUMBAR LAMINECTOMY/DECOMPRESSION MICRODISCECTOMY;  Surgeon: [name];  Location: [facility];  Service: Neurosurgery;  Laterality: N/A;   Lumbar Three and Lumbar Four Laminectomy  ? TONSILLECTOMY AND ADENOIDECTOMY  1939  ?  ?Medications Prior to Admission  ?Medication Sig Dispense Refill Last Dose  ? albuterol (VENTOLIN HFA) 108 (90 Base) MCG/ACT inhaler INHALE 2 PUFFS INTO THE LUNGS EVERY 4 HOURS AS NEEDED FOR WHEEZING OR SHORTNESS OF BREATH 6.7 g 3 10/09/2021  ? carbidopa-levodopa (SINEMET IR) 25-100 MG tablet Take 1 tablet by mouth 3 (three) times daily. Take 1 tablet three times a day   10/09/2021  ? Coenzyme Q10 (CO Q10) 200 MG CAPS Take 1 capsule by mouth daily.   10/08/2021 at 1500  ? DULoxetine (CYMBALTA) 30 MG capsule TAKE 1 CAPSULE(30 MG) BY MOUTH DAILY 30 capsule 2 10/09/2021  ? empagliflozin (JARDIANCE) 10 MG TABS tablet Take by mouth daily.   10/09/2021  ? esomeprazole (NEXIUM) 40 MG capsule TAKE 1 CAPSULE BY MOUTH EVERY DAY 90 capsule 1 10/09/2021  ? fluticasone (FLONASE) 50 MCG/ACT nasal spray Place 2 sprays into both nostrils daily. 16 g 6 10/09/2021  ? furosemide (LASIX) 40 MG tablet Take 1 tablet (40 mg total) by mouth daily. 30 tablet 2 10/09/2021  ? gabapentin (NEURONTIN) 600 MG tablet TAKE 1 TABLET(600 MG) BY MOUTH THREE TIMES DAILY 270 tablet 3 10/09/2021  ? glimepiride (AMARYL) 4 MG tablet TAKE 1 TABLET(4 MG) BY MOUTH DAILY BEFORE BREAKFAST 90 tablet 1 10/09/2021  ? hydroxypropyl methylcellulose (ISOPTO TEARS) 2.5 % ophthalmic solution Place 1 drop into both eyes 3 (three)  times daily as needed. Dry eyes    10/08/2021  ? MAGNESIUM-OXIDE 400 (241.3 Mg) MG tablet TAKE 1 TABLET BY MOUTH TWICE DAILY 60 tablet 11 10/09/2021  ? metoprolol succinate (TOPROL-XL) 25 MG 24 hr tablet Take 0.5 tablets (12.5 mg total) by mouth daily. 15 tablet 2 10/09/2021  ? MULTIPLE VITAMIN PO Take 1 tablet by mouth daily.   10/09/2021  ? pregabalin (LYRICA) 50 MG capsule Take 50 mg by mouth 2 (two) times daily.   10/09/2021  ? Probiotic Product (ALIGN PO) Take 1 capsule by mouth daily.   10/09/2021  ? rOPINIRole (REQUIP) 0.5 MG tablet Take 0.5 mg by mouth 3 (three) times daily.    10/09/2021  ? Accu-Chek FastClix Lancets MISC Just check first thing in the morning to ensure no low blood sugar. 102 each 1   ? blood glucose meter kit and supplies KIT Just check first thing in the morning to ensure no low blood sugar. 1 each 0   ? Blood Glucose Monitoring Suppl (ACCU-CHEK GUIDE) w/Device KIT Check blood sugars daily as directed 1 kit 0   ? DULoxetine (CYMBALTA) 20 MG capsule Take 40 mg by mouth daily. (Patient not taking: Reported on 10/09/2021)   Not Taking  ? glucose blood (ACCU-CHEK GUIDE) test strip Just check first thing in the morning to ensure no low blood sugar. 100 strip 1   ? mupirocin ointment (BACTROBAN) 2 % Apply topically 2 (two) times daily.     ? tamsulosin (FLOMAX) 0.4 MG CAPS capsule TAKE 1 CAPSULE(0.4 MG) BY MOUTH DAILY (Patient not taking: Reported on 10/09/2021) 90 capsule 0 Not Taking  ? ? ?Social History  ? ?Socioeconomic History  ? Marital status: Widowed  ?  Spouse name: Not on file  ? Number of children: 1  ? Years of education: Not on file  ? Highest education level: Bachelor's degree (e.g., BA, AB, BS)  ?Occupational History  ? Occupation: retired  ?Tobacco Use  ? Smoking status: Never  ? Smokeless tobacco: Never  ?Substance and Sexual Activity  ? Alcohol use: No  ? Drug use: No  ? Sexual activity: Never  ?Other Topics Concern  ? Not on file  ?Social History Narrative  ? Not on file  ? ?Social Determinants of Health  ? ?Financial Resource Strain: Not on file  ?Food Insecurity: Not on file  ?Transportation Needs: Not on file  ?Physical Activity: Not on file  ?Stress: Not on file  ?Social Connections: Not on file  ?Intimate Partner Violence: Not on file  ?  ?Family History  ?Problem Relation Age of Onset  ? Heart failure Father   ? Heart disease Father   ? Hypertension Mother   ? Diabetes Mother   ? Heart disease Mother   ? Cancer Sister   ?     breast  ? Breast cancer Sister 49  ? Cancer Maternal Grandfather   ? Cancer Maternal Aunt   ?     x3  ? Breast cancer Daughter 21  ?   ? ?PHYSICAL EXAM ? ?General: Elderly ill and frail appearing female, laying at angle in hospital bed. ?HEENT:  Normocephalic and atramatic. [place] oxygen in place ?Neck:  No JVD.  ?Lungs: Normal respiratory effort on O2 by [place]. Coarse breath sounds to ascultation anteriorly  ?Heart: HRRR . Normal S1 and S2 without gallops or murmurs.  ?Abdomen: nondistended appearing ?Msk:  Normal strength and tone for age. ?Extremities: No clubbing, cyanosis. No LE edema.   ?Neuro: Moves  all extremities ?Psych:  awake and more alert today ? ?Labs: ?  ?Lab Results  ?Component Value Date  ? WBC 13.7 (H) 10/16/2021  ? HGB 12.0 10/16/2021  ? HCT 35.3 (L) 10/16/2021  ? MCV 112.8 (H) 10/16/2021  ? PLT 273 10/16/2021  ?  ?Recent Labs  ?Lab 10/09/21 ?1345 10/10/21 ?0416 10/15/21 ?0930 10/16/21 ?0428  ?NA 137   < > 138  --   ?K 4.1   < > 4.2  --   ?CL 97*   < > 103  --   ?CO2 32   < > 25  --   ?BUN 29*   < > 65*  --   ?CREATININE 1.69*   < > 2.13* 1.94*  ?CALCIUM 8.4*   < > 8.4*  --   ?PROT 6.8  --   --   --   ?BILITOT 0.9  --   --   --   ?ALKPHOS 69  --   --   --   ?ALT 7  --   --   --   ?AST 28  --   --   --   ?GLUCOSE 176*   < > 174*  --   ? < > = values in this interval not displayed.  ? ? ?No results found for: CKTOTAL, CKMB, CKMBINDEX, TROPONINI  ?Lab Results  ?Component Value Date  ? CHOL 149 08/26/2017  ? CHOL 142 02/27/2016  ? CHOL 149 01/15/2015  ? ?Lab Results  ?Component Value Date  ? HDL 47 08/26/2017  ? HDL 64 02/27/2016  ? HDL 51 01/15/2015  ? ?Lab Results  ?Component Value Date  ? [place] 74 08/26/2017  ? [place] 54 02/27/2016  ? [place] 67 01/15/2015  ? ?Lab Results  ?Component Value Date  ? TRIG 141 08/26/2017  ? TRIG 120 02/27/2016  ? TRIG 155 (H) 01/15/2015  ? ?Lab Results  ?Component Value Date  ? CHOLHDL 3.2 08/26/2017  ? ?No results found for: LDLDIRECT  ?  ?Radiology: DG Chest 1 View ? ?Result Date: 10/15/2021 ?CLINICAL DATA:  Acute tubular necrosis. EXAM: CHEST  1 VIEW COMPARISON:  Oct 12, 2021. FINDINGS: Stable  cardiomediastinal silhouette. Stable mild bibasilar opacities are noted concerning for atelectasis with possible small pleural effusions. Bony thorax is unremarkable. IMPRESSION: Stable mild bibasilar opacities are

## 2021-10-16 NOTE — Progress Notes (Signed)
SLP Cancellation Note ? ?Patient Details ?Name: Renee Carpenter ?MRN: [id number] ?DOB: 1926-01-26 ? ? ?Cancelled treatment:       Reason Eval/Treat Not Completed:  (chart, labs reviewed; consulted NSG re: pt's status today.) ?NSG reported pt has poor appetite but no overt s/s of aspiration during oral intake. ST services has requested kitchen continue to send nectar-thick liquids with trays as this may aid in pt's comfort with meals, per last tx note. Aspiration precautions in place; NSG staff assisting w/ meals/oral intake.  ?Noted Palliative Care notes indicating "goals continue to be short-term rehab with ultimate goal of returning to the [facility]F with paid caregivers." w/ a "switch to Hospice at a later time", per pt's contact person. Pt can f/u w/ skilled ST services w/ any needs at next venue of care for any needed education, including education w/ caregivers/contact person and Staff. Recommend continue current diet w/ thin and Nectar liquids on trays; aspiration precautions.  ? ? ? ? ? ?Jerilynn Som, MS, CCC-SLP ?Speech Language Pathologist ?Rehab Services; [facility] ?[phone] (ascom) ?Renee Carpenter ?10/16/2021, 5:53 PM ?

## 2021-10-16 NOTE — Progress Notes (Signed)
Palliative: ?Chart review completed.  At this point Renee Carpenter's goals continue to be short-term rehab with ultimate goal of returning [facility] with paid caregivers. ? ?Conference with bedside nursing staff related to patient condition, needs. ? ?Plan:   At this point continue to treat the treatable but no CPR or intubation.  Short-term rehab with ultimate goal to return to ILF.  Would benefit from outpatient palliative services. ? ?No charge ?Quinn Axe, NP ?Palliative medicine team ?Team phone [phone] ?Greater than 50% of this time was spent counseling and coordinating care related to the above assessment and plan. ?

## 2021-10-17 ENCOUNTER — Inpatient Hospital Stay: Payer: Medicare PPO

## 2021-10-17 DIAGNOSIS — I509 Heart failure, unspecified: Secondary | ICD-10-CM | POA: Diagnosis not present

## 2021-10-17 LAB — CBC WITH DIFFERENTIAL/PLATELET
Abs Immature Granulocytes: 1.93 10*3/uL — ABNORMAL HIGH (ref 0.00–0.07)
Basophils Absolute: 0.2 10*3/uL — ABNORMAL HIGH (ref 0.0–0.1)
Basophils Relative: 1 %
Eosinophils Absolute: 0.2 10*3/uL (ref 0.0–0.5)
Eosinophils Relative: 2 %
HCT: 36.9 % (ref 36.0–46.0)
Hemoglobin: 12.6 g/dL (ref 12.0–15.0)
Immature Granulocytes: 13 %
Lymphocytes Relative: 19 %
Lymphs Abs: 2.9 10*3/uL (ref 0.7–4.0)
MCH: 39 pg — ABNORMAL HIGH (ref 26.0–34.0)
MCHC: 34.1 g/dL (ref 30.0–36.0)
MCV: 114.2 fL — ABNORMAL HIGH (ref 80.0–100.0)
Monocytes Absolute: 1.9 10*3/uL — ABNORMAL HIGH (ref 0.1–1.0)
Monocytes Relative: 12 %
Neutro Abs: 7.9 10*3/uL — ABNORMAL HIGH (ref 1.7–7.7)
Neutrophils Relative %: 53 %
Platelets: 267 10*3/uL (ref 150–400)
RBC: 3.23 MIL/uL — ABNORMAL LOW (ref 3.87–5.11)
RDW: 19 % — ABNORMAL HIGH (ref 11.5–15.5)
Smear Review: NORMAL
WBC: 15 10*3/uL — ABNORMAL HIGH (ref 4.0–10.5)
nRBC: 0.3 % — ABNORMAL HIGH (ref 0.0–0.2)

## 2021-10-17 LAB — POTASSIUM: Potassium: 4.5 mmol/L (ref 3.5–5.1)

## 2021-10-17 LAB — GLUCOSE, CAPILLARY
Glucose-Capillary: 192 mg/dL — ABNORMAL HIGH (ref 70–99)
Glucose-Capillary: 223 mg/dL — ABNORMAL HIGH (ref 70–99)
Glucose-Capillary: 226 mg/dL — ABNORMAL HIGH (ref 70–99)
Glucose-Capillary: 186 mg/dL — ABNORMAL HIGH (ref 70–99)

## 2021-10-17 LAB — CREATININE, SERUM
Creatinine, Ser: 1.85 mg/dL — ABNORMAL HIGH (ref 0.44–1.00)
GFR, Estimated: 25 mL/min — ABNORMAL LOW (ref >60)

## 2021-10-17 MED ORDER — SODIUM CHLORIDE 0.9 % IV SOLN
3.0000 g | Freq: Two times a day (BID) | INTRAVENOUS | Status: DC
  Administered 2021-10-17 – 2021-10-19 (×4): 3 g via INTRAVENOUS
  Filled 2021-10-17 (×3): qty 8
  Filled 2021-10-17: qty 3
  Filled 2021-10-17: qty 8
  Filled 2021-10-17: qty 3

## 2021-10-17 NOTE — Progress Notes (Signed)
?PROGRESS NOTE ? ? ? ?Assunta Kozakiewicz Luhn  [id number] DOB: February 18, 1926 DOA: 10/09/2021 ?PCP: Jerrol Banana., MD  ? ? ?Brief Narrative:  ?88 y.o. female with medical history significant of systolic congestive heart failure EF 25 to 30%, CKD stage IIIa, type 2 diabetes mellitus, parkinsonism, obstructive sleep apnea, hypertension presents for evaluation of progressively worsening shortness of breath associated with fatigue and altered mentation.  [name] is compliance with all medications.  No abdominal pain ?  ?No home oxygen requirement.  On presentation brought in by EMS on 4 L nasal cannula after being found hypoxic at home.  Patient is a poor historian.  History obtained from multiple sources including the patient, patient's husband, ER physician, ER documentation. ?  ?On my evaluation patient is uncomfortable appearing.  Difficult to obtain history from.  Per husband at bedside patient is complaining of lower abdominal pain.  Urine drawn on admission is concerning for urinary tract infection although patient is afebrile with normal lactic acidosis and no leukocytosis.  Patient was febrile with Tmax 102.4.  Patient has lab work including elevated BNP and chest x-ray that demonstrates evidence of interstitial edema. ?  ?Alternates between agitated and lethargic.  Mental status remains poor.  No change in oxygen requirement.  Breakthrough fevers noted ?  ?Acutely hypotensive 5/6.  Midodrine, stress dose steroids, albumin administered.  Blood pressure responded.  On 5/7 much more awake.  Remains on 4 L O2. ?  ?5/9.  Shock physiology improved.  Midodrine weaned off with stabilization of blood pressure ?  ? ?5/10 creatinine down mildly.  ?5/11 no overnight issues ?5/12 updated her POA friend . ?5/13 wbc up. Coughing some. Nonproductive.  ? ? ? ?Consultants:  ?Cardiology, nephrology ? ?Procedures:  ? ?Antimicrobials:  ?  ? ? ?Subjective: ?Denies being worse sob. No cp ? ?Objective: ?Vitals:  ? 10/17/21 0421  10/17/21 0500 10/17/21 0807 10/17/21 1326  ?BP: (!) 144/67  140/71 111/81  ?Pulse: 75  73 71  ?Resp:   (!) 21 20  ?Temp:   98.1 ?F (36.7 ?C) 97.9 ?F (36.6 ?C)  ?TempSrc:      ?SpO2: 97%  97% (!) 84%  ?Weight:  67.3 kg    ?Height:      ? ? ?Intake/Output Summary (Last 24 hours) at 10/17/2021 1345 ?Last data filed at 10/17/2021 1034 ?Gross per 24 hour  ?Intake 280 ml  ?Output 350 ml  ?Net -70 ml  ? ?Filed Weights  ? 10/16/21 0804 10/16/21 1016 10/17/21 0500  ?Weight: 68.2 kg 67.9 kg 67.3 kg  ? ? ?Examination: ?Calm, NAD ?+jvd ?Upper airway mild gurgle. No wheezing ?Reg s1/s2 no gallop ?Soft benign +bs ?No edema ?Aaoxox3  ?Mood and affect appropriate in current setting  ? ? ? ?Data Reviewed: I have personally reviewed following labs and imaging studies ? ?CBC: ?Recent Labs  ?Lab 10/13/21 ?0752 10/14/21 ?[id number] 10/15/21 ?[id number] 10/15/21 ?1117 10/16/21 ?0428 10/17/21 ?0456  ?WBC 10.2 10.4 16.4* 13.4* 13.7* 15.0*  ?NEUTROABS 7.0 6.6 8.7*  --  8.5* 7.9*  ?HGB 12.4 12.9 13.3 13.8 12.0 12.6  ?HCT 36.9 37.5 38.6 39.4 35.3* 36.9  ?MCV 113.5* 112.6* 115.6* 121.2* 112.8* 114.2*  ?PLT 289 289 258 215 273 267  ? ?Basic Metabolic Panel: ?Recent Labs  ?Lab 10/11/21 ?0404 10/12/21 ?0326 10/13/21 ?[id number] 10/14/21 ?[id number] 10/15/21 ?0930 10/16/21 ?0428 10/17/21 ?0456  ?NA 139 138 139 139 138  --   --   ?K 3.9 4.0 4.3 4.5 4.2  --  4.5  ?  CL 97* 101 102 103 103  --   --   ?CO2 28 28 27 27 25   --   --   ?GLUCOSE 229* 205* 184* 168* 174*  --   --   ?BUN 48* 58* 71* 72* 65*  --   --   ?CREATININE 2.37* 2.46* 2.52* 2.30* 2.13* 1.94* 1.85*  ?CALCIUM 8.1* 8.3* 8.6*  8.6* 8.6* 8.4*  --   --   ?PHOS  --   --  3.5  --  2.9  --   --   ? ?GFR: ?Estimated Creatinine Clearance: 17.1 mL/min (A) (by C-G formula based on SCr of 1.85 mg/dL (H)). ?Liver Function Tests: ?Recent Labs  ?Lab 10/15/21 ?0930  ?ALBUMIN 2.9*  ? ?No results for input(s): LIPASE, AMYLASE in the last 168 hours. ?No results for input(s): AMMONIA in the last 168 hours. ?Coagulation Profile: ?No  results for input(s): INR, PROTIME in the last 168 hours. ?Cardiac Enzymes: ?No results for input(s): CKTOTAL, CKMB, CKMBINDEX, TROPONINI in the last 168 hours. ?BNP (last 3 results) ?No results for input(s): PROBNP in the last 8760 hours. ?HbA1C: ?No results for input(s): HGBA1C in the last 72 hours. ?CBG: ?Recent Labs  ?Lab 10/16/21 ?1127 10/16/21 ?1625 10/16/21 ?2045 10/17/21 ?[id number] 10/17/21 ?1243  ?GLUCAP 192* 258* 211* 192* 223*  ? ?Lipid Profile: ?No results for input(s): CHOL, HDL, LDLCALC, TRIG, CHOLHDL, LDLDIRECT in the last 72 hours. ?Thyroid Function Tests: ?No results for input(s): TSH, T4TOTAL, FREET4, T3FREE, THYROIDAB in the last 72 hours. ?Anemia Panel: ?No results for input(s): VITAMINB12, FOLATE, FERRITIN, TIBC, IRON, RETICCTPCT in the last 72 hours. ?Sepsis Labs: ?Recent Labs  ?Lab 10/10/21 ?1722  ?LATICACIDVEN 1.5  ? ? ?Recent Results (from the past 240 hour(s))  ?Resp Panel by RT-PCR (Flu A&B, Covid) Nasopharyngeal Swab     Status: None  ? Collection Time: 10/09/21  1:45 PM  ? Specimen: Nasopharyngeal Swab; Nasopharyngeal(NP) swabs in vial transport medium  ?Result Value Ref Range Status  ? SARS Coronavirus 2 by RT PCR NEGATIVE NEGATIVE Final  ?  Comment: (NOTE) ?SARS-CoV-2 target nucleic acids are NOT DETECTED. ? ?The SARS-CoV-2 RNA is generally detectable in upper respiratory ?specimens during the acute phase of infection. The lowest ?concentration of SARS-CoV-2 viral copies this assay can detect is ?138 copies/mL. A negative result does not preclude SARS-Cov-2 ?infection and should not be used as the sole basis for treatment or ?other patient management decisions. A negative result may occur with  ?improper specimen collection/handling, submission of specimen other ?than nasopharyngeal swab, presence of viral mutation(s) within the ?areas targeted by this assay, and inadequate number of viral ?copies(<138 copies/mL). A negative result must be combined with ?clinical observations, patient  history, and epidemiological ?information. The expected result is Negative. ? ?Fact Sheet for Patients:  ?EntrepreneurPulse.com.au ? ?Fact Sheet for Healthcare Providers:  ?IncredibleEmployment.be ? ?This test is no t yet approved or cleared by the [place] FDA and  ?has been authorized for detection and/or diagnosis of SARS-CoV-2 by ?FDA under an Emergency Use Authorization (EUA). This EUA will remain  ?in effect (meaning this test can be used) for the duration of the ?COVID-19 declaration under Section 564(b)(1) of the Act, 21 ?U.S.C.section 360bbb-3(b)(1), unless the authorization is terminated  ?or revoked sooner.  ? ? ?  ? Influenza A by PCR NEGATIVE NEGATIVE Final  ? Influenza B by PCR NEGATIVE NEGATIVE Final  ?  Comment: (NOTE) ?The Xpert Xpress SARS-CoV-2/FLU/RSV plus assay is intended as an aid ?in the diagnosis  of influenza from Nasopharyngeal swab specimens and ?should not be used as a sole basis for treatment. Nasal washings and ?aspirates are unacceptable for Xpert Xpress SARS-CoV-2/FLU/RSV ?testing. ? ?Fact Sheet for Patients: ?EntrepreneurPulse.com.au ? ?Fact Sheet for Healthcare Providers: ?IncredibleEmployment.be ? ?This test is not yet approved or cleared by the [place] FDA and ?has been authorized for detection and/or diagnosis of SARS-CoV-2 by ?FDA under an Emergency Use Authorization (EUA). This EUA will remain ?in effect (meaning this test can be used) for the duration of the ?COVID-19 declaration under Section 564(b)(1) of the Act, 21 U.S.C. ?section 360bbb-3(b)(1), unless the authorization is terminated or ?revoked. ? ?Performed at [facility], [place], ?[place] [address] ?  ?Blood Culture (routine x 2)     Status: None  ? Collection Time: 10/09/21  1:45 PM  ? Specimen: BLOOD RIGHT HAND  ?Result Value Ref Range Status  ? Specimen Description BLOOD RIGHT HAND  Final  ? Special Requests    Final  ?  BOTTLES DRAWN AEROBIC AND ANAEROBIC Blood Culture adequate volume  ? Culture   Final  ?  NO GROWTH 5 DAYS ?Performed at [facility], [address] [place], [place] [address] ?  ? Re

## 2021-10-17 NOTE — Plan of Care (Signed)
  Problem: Clinical Measurements: Goal: Ability to maintain clinical measurements within normal limits will improve Outcome: Progressing   Problem: Clinical Measurements: Goal: Will remain free from infection Outcome: Progressing   

## 2021-10-17 NOTE — Progress Notes (Addendum)
Renee Carpenter  MRN: [id number]  DOB/AGE: [birth year] 88 y.o.  Primary Care Physician:Gilbert, Leonette Monarch., MD  Admit date: 10/09/2021  Chief Complaint:  Chief Complaint  Patient presents with   Shortness of Breath   Fever    S-Pt presented on  10/09/2021 with  Chief Complaint  Patient presents with   Shortness of Breath   Fever  .   Patient is a 88 year old Caucasian female with a past medical history of chronic systolic CHF with ejection fraction of around 25 to 30%, CKD stage IIIb, diabetes mellitus type 2, Parkinson's, obstructive sleep apnea, hypertension who was brought to the ER with chief complaint of shortness of breath.  Patient caregiver was present in the room Patient is more alert than yesterday Patient offers no specific physical complaint.  Patient today asked me about her kidney.  I then answered the patient's kidney related condition to the best of my ability  Medications  carbidopa-levodopa  1 tablet Oral TID   Chlorhexidine Gluconate Cloth  6 each Topical Daily   DULoxetine  30 mg Oral Daily   furosemide  20 mg Oral Daily   gabapentin  200 mg Oral Q8H   heparin injection (subcutaneous)  5,000 Units Subcutaneous Q8H   insulin aspart  0-9 Units Subcutaneous TID WC   metoprolol tartrate  12.5 mg Oral BID   pantoprazole sodium  40 mg Oral Daily   rOPINIRole  0.5 mg Oral TID   sodium chloride flush  3 mL Intravenous Q12H         ROS: Patient offers no other specific complaints  Physical Exam: Vital signs in last 24 hours: Temp:  [97 F (36.1 C)-98.1 F (36.7 C)] 98.1 F (36.7 C) (05/13 0807) Pulse Rate:  [64-75] 73 (05/13 0807) Resp:  [18-21] 21 (05/13 0807) BP: (109-144)/(44-71) 140/71 (05/13 0807) SpO2:  [92 %-97 %] 97 % (05/13 0807) Weight:  [67.3 kg] 67.3 kg (05/13 0500) Weight change:  Last BM Date : 10/17/21  Intake/Output from previous day: 05/12 0701 - 05/13 0700 In: 220 [P.O.:220] Out: 350 [Urine:350] Total I/O In: 60  [P.O.:60] Out: -    Physical Exam:  General- pt is Awake alert and following commands, critically ill-appearing Resp- No acute REsp distress,  Rhonchi+, Decreased breath sounds at bases CVS- S1S2 regular in rate and rhythm GIT- BS+, soft, NT, ND EXT- NO LE Edema, Cyanosis CNS- CN 2-12 grossly intact. Moving all 4 extremities  Lab Results:  CBC  Recent Labs    10/16/21 0428 10/17/21 0456  WBC 13.7* 15.0*  HGB 12.0 12.6  HCT 35.3* 36.9  PLT 273 267    BMET  Recent Labs    10/15/21 0930 10/16/21 0428 10/17/21 0456  NA 138  --   --   K 4.2  --  4.5  CL 103  --   --   CO2 25  --   --   GLUCOSE 174*  --   --   BUN 65*  --   --   CREATININE 2.13* 1.94* 1.85*  CALCIUM 8.4*  --   --       Most recent Creatinine trend  Lab Results  Component Value Date   CREATININE 1.85 (H) 10/17/2021   CREATININE 1.94 (H) 10/16/2021   CREATININE 2.13 (H) 10/15/2021      MICRO   Recent Results (from the past 240 hour(s))  Resp Panel by RT-PCR (Flu A&B, Covid) Nasopharyngeal Swab     Status: None   Collection  Time: 10/09/21  1:45 PM   Specimen: Nasopharyngeal Swab; Nasopharyngeal(NP) swabs in vial transport medium  Result Value Ref Range Status   SARS Coronavirus 2 by RT PCR NEGATIVE NEGATIVE Final    Comment: (NOTE) SARS-CoV-2 target nucleic acids are NOT DETECTED.  The SARS-CoV-2 RNA is generally detectable in upper respiratory specimens during the acute phase of infection. The lowest concentration of SARS-CoV-2 viral copies this assay can detect is 138 copies/mL. A negative result does not preclude SARS-Cov-2 infection and should not be used as the sole basis for treatment or other patient management decisions. A negative result may occur with  improper specimen collection/handling, submission of specimen other than nasopharyngeal swab, presence of viral mutation(s) within the areas targeted by this assay, and inadequate number of viral copies(<138 copies/mL). A  negative result must be combined with clinical observations, patient history, and epidemiological information. The expected result is Negative.  Fact Sheet for Patients:  BloggerCourse.com  Fact Sheet for Healthcare Providers:  SeriousBroker.it  This test is no t yet approved or cleared by the [place] FDA and  has been authorized for detection and/or diagnosis of SARS-CoV-2 by FDA under an Emergency Use Authorization (EUA). This EUA will remain  in effect (meaning this test can be used) for the duration of the COVID-19 declaration under Section 564(b)(1) of the Act, 21 U.S.C.section 360bbb-3(b)(1), unless the authorization is terminated  or revoked sooner.       Influenza A by PCR NEGATIVE NEGATIVE Final   Influenza B by PCR NEGATIVE NEGATIVE Final    Comment: (NOTE) The Xpert Xpress SARS-CoV-2/FLU/RSV plus assay is intended as an aid in the diagnosis of influenza from Nasopharyngeal swab specimens and should not be used as a sole basis for treatment. Nasal washings and aspirates are unacceptable for Xpert Xpress SARS-CoV-2/FLU/RSV testing.  Fact Sheet for Patients: BloggerCourse.com  Fact Sheet for Healthcare Providers: SeriousBroker.it  This test is not yet approved or cleared by the [place] FDA and has been authorized for detection and/or diagnosis of SARS-CoV-2 by FDA under an Emergency Use Authorization (EUA). This EUA will remain in effect (meaning this test can be used) for the duration of the COVID-19 declaration under Section 564(b)(1) of the Act, 21 U.S.C. section 360bbb-3(b)(1), unless the authorization is terminated or revoked.  Performed at [facility], [address] [place], [place] [address]   Blood Culture (routine x 2)     Status: None   Collection Time: 10/09/21  1:45 PM   Specimen: BLOOD RIGHT HAND  Result Value Ref Range Status    Specimen Description BLOOD RIGHT HAND  Final   Special Requests   Final    BOTTLES DRAWN AEROBIC AND ANAEROBIC Blood Culture adequate volume   Culture   Final    NO GROWTH 5 DAYS Performed at [facility], [address] [place], [place] [address]    Report Status 10/14/2021 FINAL  Final  Blood Culture (routine x 2)     Status: None   Collection Time: 10/09/21  1:45 PM   Specimen: BLOOD  Result Value Ref Range Status   Specimen Description BLOOD LEFT ANTECUBITAL  Final   Special Requests   Final    BOTTLES DRAWN AEROBIC AND ANAEROBIC Blood Culture adequate volume   Culture   Final    NO GROWTH 5 DAYS Performed at [facility], [address] [place], [place] [address]    Report Status 10/14/2021 FINAL  Final  MRSA Next Gen by PCR, Nasal  Status: None   Collection Time: 10/10/21  9:21 AM   Specimen: Nasal Mucosa; Nasal Swab  Result Value Ref Range Status   MRSA by PCR Next Gen NOT DETECTED NOT DETECTED Final    Comment: (NOTE) The GeneXpert MRSA Assay (FDA approved for NASAL specimens only), is one component of a comprehensive MRSA colonization surveillance program. It is not intended to diagnose MRSA infection nor to guide or monitor treatment for MRSA infections. Test performance is not FDA approved in patients less than 47 years old. Performed at [facility], [address], [place], [place] [address]      Renal ultrasound done on August 12, 2021 was reviewed FINDINGS: Right Kidney:   Renal measurements: 10 x 4.2 x 3.8 cm = volume: 83 mL. The right kidney demonstrates cortical atrophy and increased cortical echogenicity. No hydronephrosis or focal lesion identified.   Left Kidney:   Renal measurements: 9.2 x 4.4 x 3.5 cm = volume: 75 mL. The left kidney demonstrates increased cortical echogenicity. No hydronephrosis or focal lesion identified.   Bladder:   The bladder is decompressed by a Foley catheter.   Other:   None.    IMPRESSION: Increased cortical echogenicity of both kidneys is consistent with underlying chronic kidney disease. No hydronephrosis.       Impression:  1)Renal   Acute kidney injury AKI secondary to ATN AKI secondary to multiple factors AKI secondary to UTI/sepsis AKI secondary to hypotension as patient has SBP of less than 80 recently AKI secondary to medications as patient was recently started on Jardiance AKI secondary to cardiorenal syndrome   Patient has AKI on CKD Patient has CKD stage IIIb Patient has CKD age associated decline As patient is 88 years old Patient has CKD since 2016 Patient has CKD secondary to diabetes mellitus     AKI is better. Creatinine is now trending down  Will follow closely    Creatinine trend 2023 1.4==>2.5==>2.1==>1.8 AKI 2022 1.4--1.6 2021  1.2--1.3 2020  1.6 2019   1.3 2018   0.9--1.1 2017  1.2--1.4 2016  1.5       2)Hypertension Patient blood pressure was low earlier Patient blood pressure is now better   3)Anemia of chronic disease     Latest Ref Rng & Units 10/17/2021    4:56 AM 10/16/2021    4:28 AM 10/15/2021   11:17 AM  CBC  WBC 4.0 - 10.5 K/uL 15.0   13.7   13.4    Hemoglobin 12.0 - 15.0 g/dL 91.4   78.2   95.6    Hematocrit 36.0 - 46.0 % 36.9   35.3   39.4    Platelets 150 - 400 K/uL 267   273   215      Patient hemoglobin was earlier at 11.3, now better     4) Secondary hyperparathyroidism -CKD Mineral-Bone Disorder    Lab Results  Component Value Date   PTH 40 10/13/2021   PTH Comment 10/13/2021   CALCIUM 8.4 (L) 10/15/2021   PHOS 2.9 10/15/2021    Secondary Hyperparathyroidism  absent  Phosphorus at goal.  5) acute on chronic systolic CHF  Patient has history of acute on chronic systolic CHF Patient diuretics were on hold Yesterday patient had more rhonchi and a dose of diuretics were given   6) Electrolytes      Latest Ref Rng & Units 10/17/2021    4:56 AM 10/16/2021    4:28 AM  10/15/2021    9:30 AM  BMP  Glucose 70 - 99 mg/dL   409    BUN 8 - 23 mg/dL   65    Creatinine 8.11 - 1.00 mg/dL 9.14   7.82   9.56    Sodium 135 - 145 mmol/L   138    Potassium 3.5 - 5.1 mmol/L 4.5    4.2    Chloride 98 - 111 mmol/L   103    CO2 22 - 32 mmol/L   25    Calcium 8.9 - 10.3 mg/dL   8.4       Sodium Normonatremic   Potassium Normokalemic    7)Acid base   Co2 at goal  8)UTI/sepsis Patient was  on antibiotics  9)Acute hypoxic respiratory failure Patient is clinically stable  Plan:  Patient admitted with AKI, CKD stage IIIb, sepsis secondary to UTI, acute hypoxic respiratory failure, acute on chronic systolic CHF  Patient is having more rhonchi than previously -Pneumonia versus CHF We will ask for chest x-ray Patient creatinine is improving We will continue the current  dose of diuretics for now    Jacari Iannello s Tangee Marszalek 10/17/2021, 1:16 PM

## 2021-10-17 NOTE — Progress Notes (Signed)
Pharmacy Antibiotic Note [name] is a 88 y.o. female admitted on 10/09/2021 with aspiration pneumonia. PMH HFrEF 25-30%, CKD3, T2DM, Parkinsonism, obstructive sleep apnea, HTN. Pharmacy has been consulted for ampicillin/sulbactam dosing. ? ?Hypoxic on [place] 4L. Chest x ray showing pleural effusion and atelectasis. Scr c/w baseline.  ? ?Plan: Unasyn (ampicillin/sulbactam) 3 grams every 12 hours ? ?Monitor renal function and clinical course.  ? ?Height: 5\' 5"  (165.1 cm) ?Weight: 67.3 kg (148 lb 5.9 oz) ?IBW/kg (Calculated) : 57 ? ?Temp (24hrs), Avg:97.6 ?F (36.4 ?C), Min:97 ?F (36.1 ?C), Max:98.1 ?F (36.7 ?C) ? ?Recent Labs  ?Lab 10/10/21 ?1722 10/11/21 ?0404 10/13/21 ?[id number] 10/14/21 ?[id number] 10/15/21 ?[id number] 10/15/21 ?0930 10/15/21 ?1117 10/16/21 ?0428 10/17/21 ?0456  ?WBC  --    < > 10.2 10.4 16.4*  --  13.4* 13.7* 15.0*  ?CREATININE  --    < > 2.52* 2.30*  --  2.13*  --  1.94* 1.85*  ?LATICACIDVEN 1.5  --   --   --   --   --   --   --   --   ? < > = values in this interval not displayed.  ?  ?Estimated Creatinine Clearance: 17.1 mL/min (A) (by C-G formula based on SCr of 1.85 mg/dL (H)).   ? ?Allergies  ?Allergen Reactions  ? Acetaminophen Itching and Other (See Comments)  ?  Stomach upset  ? Aspirin Itching and Other (See Comments)  ?  Stomach pain  ? Metformin And Related   ?  nausea  ? Milk-Related Compounds Diarrhea  ? Other Other (See Comments)  ?  Novocaine, strange feeling  ? Sulfa Antibiotics Other (See Comments)  ? Sulfasalazine   ?  Other reaction(s): Other (See Comments)  ? Tizanidine   ?  Diarrhea, almost passed out, felt dizzy and confused  ? Caffeine Palpitations  ? Ginger Palpitations  ? Ibuprofen Rash  ? ? ?Antimicrobials this admission: ?Ceftriaxone 5/5 >> 5/5 ?Cefepime  5/6 >> 5/10 ?Azithromycin 5/5 >> 5/9 ?Vancomycin 5/6 >> 5/6 ?Ampicillin/sulbactam 5/13 >> ? ?Dose adjustments this admission: ?N/a ? ?Microbiology results: ?5/5 BCx: NG x 5 days ?5/6 MRSA PCR: Not detected ? ?Thank you for  allowing pharmacy to be a part of this patientRenees care. ? ? ?Wynelle Cleveland, PharmD ?Pharmacy Resident  ?10/17/2021 ?1:52 PM ?

## 2021-10-18 DIAGNOSIS — I509 Heart failure, unspecified: Secondary | ICD-10-CM | POA: Diagnosis not present

## 2021-10-18 LAB — CBC
HCT: 35.3 % — ABNORMAL LOW (ref 36.0–46.0)
Hemoglobin: 12.1 g/dL (ref 12.0–15.0)
MCH: 38.8 pg — ABNORMAL HIGH (ref 26.0–34.0)
MCHC: 34.3 g/dL (ref 30.0–36.0)
MCV: 113.1 fL — ABNORMAL HIGH (ref 80.0–100.0)
Platelets: 267 10*3/uL (ref 150–400)
RBC: 3.12 MIL/uL — ABNORMAL LOW (ref 3.87–5.11)
RDW: 19.1 % — ABNORMAL HIGH (ref 11.5–15.5)
WBC: 15.2 10*3/uL — ABNORMAL HIGH (ref 4.0–10.5)
nRBC: 0.1 % (ref 0.0–0.2)

## 2021-10-18 LAB — GLUCOSE, CAPILLARY
Glucose-Capillary: 162 mg/dL — ABNORMAL HIGH (ref 70–99)
Glucose-Capillary: 204 mg/dL — ABNORMAL HIGH (ref 70–99)
Glucose-Capillary: 186 mg/dL — ABNORMAL HIGH (ref 70–99)
Glucose-Capillary: 163 mg/dL — ABNORMAL HIGH (ref 70–99)

## 2021-10-18 LAB — CREATININE, SERUM
Creatinine, Ser: 1.62 mg/dL — ABNORMAL HIGH (ref 0.44–1.00)
GFR, Estimated: 29 mL/min — ABNORMAL LOW (ref >60)

## 2021-10-18 MED ORDER — SENNOSIDES-DOCUSATE SODIUM 8.6-50 MG PO TABS: 1.0000 | ORAL_TABLET | Freq: Every day | ORAL | Status: DC

## 2021-10-18 MED ORDER — POLYETHYLENE GLYCOL 3350 17 G PO PACK: 17.0000 g | PACK | Freq: Every day | ORAL | Status: DC

## 2021-10-18 MED ORDER — FUROSEMIDE 20 MG PO TABS
20.0000 mg | ORAL_TABLET | Freq: Two times a day (BID) | ORAL | Status: DC
Start: 2021-10-18 — End: 2021-10-19
  Administered 2021-10-18 – 2021-10-19 (×2): 20 mg via ORAL
  Filled 2021-10-18 (×2): qty 1

## 2021-10-18 NOTE — Progress Notes (Signed)
Renee Carpenter  MRN: [id number]  DOB/AGE: Jun 21, 1925 88 y.o.  Primary Care Physician:Gilbert, Leonette Monarch., MD  Admit date: 10/09/2021  Chief Complaint:  Chief Complaint  Patient presents with   Shortness of Breath   Fever    S-Pt presented on  10/09/2021 with  Chief Complaint  Patient presents with   Shortness of Breath   Fever  .   Patient is a 88 year old Caucasian female with a past medical history of chronic systolic CHF with ejection fraction of around 25 to 30%, CKD stage IIIb, diabetes mellitus type 2, Parkinson's, obstructive sleep apnea, hypertension who was brought to the ER with chief complaint of shortness of breath.    Patient's niece was present in the room Patient responded appropriately Patient was more alert than before Patient offers no specific physical complaint.      Medications  carbidopa-levodopa  1 tablet Oral TID   Chlorhexidine Gluconate Cloth  6 each Topical Daily   DULoxetine  30 mg Oral Daily   furosemide  20 mg Oral Daily   gabapentin  200 mg Oral Q8H   heparin injection (subcutaneous)  5,000 Units Subcutaneous Q8H   insulin aspart  0-9 Units Subcutaneous TID WC   metoprolol tartrate  12.5 mg Oral BID   pantoprazole sodium  40 mg Oral Daily   rOPINIRole  0.5 mg Oral TID   sodium chloride flush  3 mL Intravenous Q12H         ROS: Patient offers no other specific complaints  Physical Exam: Vital signs in last 24 hours: Temp:  [97.8 F (36.6 C)-98.4 F (36.9 C)] 98.1 F (36.7 C) (05/14 0841) Pulse Rate:  [71-84] 72 (05/14 0841) Resp:  [16-20] 17 (05/14 0841) BP: (106-132)/(53-81) 106/53 (05/14 0841) SpO2:  [84 %-100 %] 95 % (05/14 0841) Weight change:  Last BM Date : 10/17/21  Intake/Output from previous day: 05/13 0701 - 05/14 0700 In: 340 [P.O.:240; IV Piggyback:100] Out: 825 [Urine:825] No intake/output data recorded.   Physical Exam:  General- pt is Awake alert and following commands, critically  ill-appearing Resp- No acute REsp distress,  Rhonchi+, Decreased breath sounds at bases CVS- S1S2 regular in rate and rhythm GIT- BS+, soft, NT, ND EXT- 1+ LE Edema, Cyanosis CNS- CN 2-12 grossly intact. Moving all 4 extremities  Lab Results:  CBC  Recent Labs    10/17/21 0456 10/18/21 0351  WBC 15.0* 15.2*  HGB 12.6 12.1  HCT 36.9 35.3*  PLT 267 267    BMET  Recent Labs    10/15/21 0930 10/16/21 0428 10/17/21 0456 10/18/21 0351  NA 138  --   --   --   K 4.2  --  4.5  --   CL 103  --   --   --   CO2 25  --   --   --   GLUCOSE 174*  --   --   --   BUN 65*  --   --   --   CREATININE 2.13*   < > 1.85* 1.62*  CALCIUM 8.4*  --   --   --    < > = values in this interval not displayed.      Most recent Creatinine trend  Lab Results  Component Value Date   CREATININE 1.62 (H) 10/18/2021   CREATININE 1.85 (H) 10/17/2021   CREATININE 1.94 (H) 10/16/2021      MICRO   Recent Results (from the past 240 hour(s))  Resp Panel by RT-PCR (  Flu A&B, Covid) Nasopharyngeal Swab     Status: None   Collection Time: 10/09/21  1:45 PM   Specimen: Nasopharyngeal Swab; Nasopharyngeal(NP) swabs in vial transport medium  Result Value Ref Range Status   SARS Coronavirus 2 by RT PCR NEGATIVE NEGATIVE Final    Comment: (NOTE) SARS-CoV-2 target nucleic acids are NOT DETECTED.  The SARS-CoV-2 RNA is generally detectable in upper respiratory specimens during the acute phase of infection. The lowest concentration of SARS-CoV-2 viral copies this assay can detect is 138 copies/mL. A negative result does not preclude SARS-Cov-2 infection and should not be used as the sole basis for treatment or other patient management decisions. A negative result may occur with  improper specimen collection/handling, submission of specimen other than nasopharyngeal swab, presence of viral mutation(s) within the areas targeted by this assay, and inadequate number of viral copies(<138 copies/mL). A  negative result must be combined with clinical observations, patient history, and epidemiological information. The expected result is Negative.  Fact Sheet for Patients:  BloggerCourse.com  Fact Sheet for Healthcare Providers:  SeriousBroker.it  This test is no t yet approved or cleared by the [place] FDA and  has been authorized for detection and/or diagnosis of SARS-CoV-2 by FDA under an Emergency Use Authorization (EUA). This EUA will remain  in effect (meaning this test can be used) for the duration of the COVID-19 declaration under Section 564(b)(1) of the Act, 21 U.S.C.section 360bbb-3(b)(1), unless the authorization is terminated  or revoked sooner.       Influenza A by PCR NEGATIVE NEGATIVE Final   Influenza B by PCR NEGATIVE NEGATIVE Final    Comment: (NOTE) The Xpert Xpress SARS-CoV-2/FLU/RSV plus assay is intended as an aid in the diagnosis of influenza from Nasopharyngeal swab specimens and should not be used as a sole basis for treatment. Nasal washings and aspirates are unacceptable for Xpert Xpress SARS-CoV-2/FLU/RSV testing.  Fact Sheet for Patients: BloggerCourse.com  Fact Sheet for Healthcare Providers: SeriousBroker.it  This test is not yet approved or cleared by the [place] FDA and has been authorized for detection and/or diagnosis of SARS-CoV-2 by FDA under an Emergency Use Authorization (EUA). This EUA will remain in effect (meaning this test can be used) for the duration of the COVID-19 declaration under Section 564(b)(1) of the Act, 21 U.S.C. section 360bbb-3(b)(1), unless the authorization is terminated or revoked.  Performed at [facility], [address] [place], [place] [address]   Blood Culture (routine x 2)     Status: None   Collection Time: 10/09/21  1:45 PM   Specimen: BLOOD RIGHT HAND  Result Value Ref Range Status    Specimen Description BLOOD RIGHT HAND  Final   Special Requests   Final    BOTTLES DRAWN AEROBIC AND ANAEROBIC Blood Culture adequate volume   Culture   Final    NO GROWTH 5 DAYS Performed at [facility], [address], [place], [place] [address]    Report Status 10/14/2021 FINAL  Final  Blood Culture (routine x 2)     Status: None   Collection Time: 10/09/21  1:45 PM   Specimen: BLOOD  Result Value Ref Range Status   Specimen Description BLOOD LEFT ANTECUBITAL  Final   Special Requests   Final    BOTTLES DRAWN AEROBIC AND ANAEROBIC Blood Culture adequate volume   Culture   Final    NO GROWTH 5 DAYS Performed at [facility], [address] [place], [place] [address]  Report Status 10/14/2021 FINAL  Final  MRSA Next Gen by PCR, Nasal     Status: None   Collection Time: 10/10/21  9:21 AM   Specimen: Nasal Mucosa; Nasal Swab  Result Value Ref Range Status   MRSA by PCR Next Gen NOT DETECTED NOT DETECTED Final    Comment: (NOTE) The GeneXpert MRSA Assay (FDA approved for NASAL specimens only), is one component of a comprehensive MRSA colonization surveillance program. It is not intended to diagnose MRSA infection nor to guide or monitor treatment for MRSA infections. Test performance is not FDA approved in patients less than 50 years old. Performed at [facility], [address] [place], [place] [address]      Renal ultrasound done on August 12, 2021 was reviewed FINDINGS: Right Kidney:   Renal measurements: 10 x 4.2 x 3.8 cm = volume: 83 mL. The right kidney demonstrates cortical atrophy and increased cortical echogenicity. No hydronephrosis or focal lesion identified.   Left Kidney:   Renal measurements: 9.2 x 4.4 x 3.5 cm = volume: 75 mL. The left kidney demonstrates increased cortical echogenicity. No hydronephrosis or focal lesion identified.   Bladder:   The bladder is decompressed by a Foley catheter.   Other:   None.    IMPRESSION: Increased cortical echogenicity of both kidneys is consistent with underlying chronic kidney disease. No hydronephrosis.       Impression:  1)Renal   Acute kidney injury AKI secondary to ATN AKI secondary to multiple factors AKI secondary to UTI/sepsis AKI secondary to hypotension as patient has SBP of less than 80 recently AKI secondary to medications as patient was recently started on Jardiance AKI secondary to cardiorenal syndrome   Patient has AKI on CKD Patient has CKD stage IIIb Patient has CKD age associated decline As patient is 88 years old Patient has CKD since 2016 Patient has CKD secondary to diabetes mellitus     AKI is better. Creatinine is now trending down  Will follow closely    Creatinine trend 2023 1.4==>2.5==>2.1==>1.8=>1.6 AKI 2022 1.4--1.6 2021  1.2--1.3 2020  1.6 2019   1.3 2018   0.9--1.1 2017  1.2--1.4 2016  1.5       2)Hypertension Patient blood pressure was low earlier Patient blood pressure is now better   3)Anemia of chronic disease     Latest Ref Rng & Units 10/18/2021    3:51 AM 10/17/2021    4:56 AM 10/16/2021    4:28 AM  CBC  WBC 4.0 - 10.5 K/uL 15.2   15.0   13.7    Hemoglobin 12.0 - 15.0 g/dL 60.4   54.0   98.1    Hematocrit 36.0 - 46.0 % 35.3   36.9   35.3    Platelets 150 - 400 K/uL 267   267   273      Patient hemoglobin was earlier at 11.3, now better     4) Secondary hyperparathyroidism -CKD Mineral-Bone Disorder    Lab Results  Component Value Date   PTH 40 10/13/2021   PTH Comment 10/13/2021   CALCIUM 8.4 (L) 10/15/2021   PHOS 2.9 10/15/2021    Secondary Hyperparathyroidism  absent  Phosphorus at goal.  5) acute on chronic systolic CHF  Patient has history of acute on chronic systolic CHF Patient diuretics were on hold Patient is now back on diuretics   6) Electrolytes      Latest Ref Rng & Units 10/18/2021    3:51 AM 10/17/2021  4:56 AM 10/16/2021    4:28 AM  BMP   Creatinine 0.44 - 1.00 mg/dL 1.61   0.96   0.45    Potassium 3.5 - 5.1 mmol/L  4.5        Sodium Normonatremic   Potassium Normokalemic    7)Acid base   Co2 at goal  8)UTI/sepsis Patient was  on antibiotics  9)Acute hypoxic respiratory failure Patient is clinically stable  Plan:  Patient admitted with AKI, CKD stage IIIb, sepsis secondary to UTI, acute hypoxic respiratory failure, acute on chronic systolic CHF   We will continue the current  dose of diuretics for now    Anselm Aumiller s [facility] 10/18/2021, 9:04 AM

## 2021-10-18 NOTE — Progress Notes (Signed)
?PROGRESS NOTE ? ? ? ?Renee Carpenter  [id number] DOB: 1925-09-01 DOA: 10/09/2021 ?PCP: Jerrol Banana., MD  ? ? ?Brief Narrative:  ?88 y.o. female with medical history significant of systolic congestive heart failure EF 25 to 30%, CKD stage IIIa, type 2 diabetes mellitus, parkinsonism, obstructive sleep apnea, hypertension presents for evaluation of progressively worsening shortness of breath associated with fatigue and altered mentation.  [name] is compliance with all medications.  No abdominal pain ?  ?No home oxygen requirement.  On presentation brought in by EMS on 4 L nasal cannula after being found hypoxic at home.  Patient is a poor historian.  History obtained from multiple sources including the patient, patient's husband, ER physician, ER documentation. ?  ?On my evaluation patient is uncomfortable appearing.  Difficult to obtain history from.  Per husband at bedside patient is complaining of lower abdominal pain.  Urine drawn on admission is concerning for urinary tract infection although patient is afebrile with normal lactic acidosis and no leukocytosis.  Patient was febrile with Tmax 102.4.  Patient has lab work including elevated BNP and chest x-ray that demonstrates evidence of interstitial edema. ?  ?Alternates between agitated and lethargic.  Mental status remains poor.  No change in oxygen requirement.  Breakthrough fevers noted ?  ?Acutely hypotensive 5/6.  Midodrine, stress dose steroids, albumin administered.  Blood pressure responded.  On 5/7 much more awake.  Remains on 4 L O2. ?  ?5/9.  Shock physiology improved.  Midodrine weaned off with stabilization of blood pressure ?  ? ?5/10 creatinine down mildly.  ?5/11 no overnight issues ?5/12 updated her POA friend . ?5/13 wbc up. Coughing some. Nonproductive.  ?5/14 family at bedside. C/o constipation ? ? ? ?Consultants:  ?Cardiology, nephrology ? ?Procedures:  ? ?Antimicrobials:  ?  ? ? ?Subjective: ?Feels sob the same. No cp..  reports 'feels fine" ? ?Objective: ?Vitals:  ? 10/18/21 0422 10/18/21 0424 10/18/21 0841 10/18/21 1118  ?BP: 132/63 132/63 (!) 106/53 110/60  ?Pulse: 78 78 72 74  ?Resp:  16 17 17   ?Temp:  97.8 ?F (36.6 ?C) 98.1 ?F (36.7 ?C) 98.4 ?F (36.9 ?C)  ?TempSrc:  Oral  Oral  ?SpO2: 92% 96% 95% 96%  ?Weight:      ?Height:      ? ? ?Intake/Output Summary (Last 24 hours) at 10/18/2021 1224 ?Last data filed at 10/18/2021 0400 ?Gross per 24 hour  ?Intake 280 ml  ?Output 825 ml  ?Net -545 ml  ? ?Filed Weights  ? 10/16/21 0804 10/16/21 1016 10/17/21 0500  ?Weight: 68.2 kg 67.9 kg 67.3 kg  ? ? ?Examination: ?Calm, NAD ?Decrease bs, rhonchorus upper airways ?Reg s1/s2 no gallop ?Soft benign +bs ?No edema ?Aaoxox3  ?Mood and affect appropriate in current setting  ? ? ? ?Data Reviewed: I have personally reviewed following labs and imaging studies ? ?CBC: ?Recent Labs  ?Lab 10/13/21 ?0752 10/14/21 ?[id number] 10/15/21 ?[id number] 10/15/21 ?1117 10/16/21 ?[id number] 10/17/21 ?[id number] 10/18/21 ?0351  ?WBC 10.2 10.4 16.4* 13.4* 13.7* 15.0* 15.2*  ?NEUTROABS 7.0 6.6 8.7*  --  8.5* 7.9*  --   ?HGB 12.4 12.9 13.3 13.8 12.0 12.6 12.1  ?HCT 36.9 37.5 38.6 39.4 35.3* 36.9 35.3*  ?MCV 113.5* 112.6* 115.6* 121.2* 112.8* 114.2* 113.1*  ?PLT 289 289 258 215 273 267 267  ? ?Basic Metabolic Panel: ?Recent Labs  ?Lab 10/12/21 ?0326 10/13/21 ?0752 10/14/21 ?[id number] 10/15/21 ?0930 10/16/21 ?0428 10/17/21 ?0456 10/18/21 ?0351  ?NA 138 139 139 138  --   --   --   ?  K 4.0 4.3 4.5 4.2  --  4.5  --   ?CL 101 102 103 103  --   --   --   ?CO2 28 27 27 25   --   --   --   ?GLUCOSE 205* 184* 168* 174*  --   --   --   ?BUN 58* 71* 72* 65*  --   --   --   ?CREATININE 2.46* 2.52* 2.30* 2.13* 1.94* 1.85* 1.62*  ?CALCIUM 8.3* 8.6*  8.6* 8.6* 8.4*  --   --   --   ?PHOS  --  3.5  --  2.9  --   --   --   ? ?GFR: ?Estimated Creatinine Clearance: 19.5 mL/min (A) (by C-G formula based on SCr of 1.62 mg/dL (H)). ?Liver Function Tests: ?Recent Labs  ?Lab 10/15/21 ?0930  ?ALBUMIN 2.9*  ? ?No results for  input(s): LIPASE, AMYLASE in the last 168 hours. ?No results for input(s): AMMONIA in the last 168 hours. ?Coagulation Profile: ?No results for input(s): INR, PROTIME in the last 168 hours. ?Cardiac Enzymes: ?No results for input(s): CKTOTAL, CKMB, CKMBINDEX, TROPONINI in the last 168 hours. ?BNP (last 3 results) ?No results for input(s): PROBNP in the last 8760 hours. ?HbA1C: ?No results for input(s): HGBA1C in the last 72 hours. ?CBG: ?Recent Labs  ?Lab 10/17/21 ?1243 10/17/21 ?1652 10/17/21 ?1957 10/18/21 ?[id number] 10/18/21 ?1115  ?GLUCAP 223* 226* 186* 162* 204*  ? ?Lipid Profile: ?No results for input(s): CHOL, HDL, LDLCALC, TRIG, CHOLHDL, LDLDIRECT in the last 72 hours. ?Thyroid Function Tests: ?No results for input(s): TSH, T4TOTAL, FREET4, T3FREE, THYROIDAB in the last 72 hours. ?Anemia Panel: ?No results for input(s): VITAMINB12, FOLATE, FERRITIN, TIBC, IRON, RETICCTPCT in the last 72 hours. ?Sepsis Labs: ?No results for input(s): PROCALCITON, LATICACIDVEN in the last 168 hours. ? ? ?Recent Results (from the past 240 hour(s))  ?Resp Panel by RT-PCR (Flu A&B, Covid) Nasopharyngeal Swab     Status: None  ? Collection Time: 10/09/21  1:45 PM  ? Specimen: Nasopharyngeal Swab; Nasopharyngeal(NP) swabs in vial transport medium  ?Result Value Ref Range Status  ? SARS Coronavirus 2 by RT PCR NEGATIVE NEGATIVE Final  ?  Comment: (NOTE) ?SARS-CoV-2 target nucleic acids are NOT DETECTED. ? ?The SARS-CoV-2 RNA is generally detectable in upper respiratory ?specimens during the acute phase of infection. The lowest ?concentration of SARS-CoV-2 viral copies this assay can detect is ?138 copies/mL. A negative result does not preclude SARS-Cov-2 ?infection and should not be used as the sole basis for treatment or ?other patient management decisions. A negative result may occur with  ?improper specimen collection/handling, submission of specimen other ?than nasopharyngeal swab, presence of viral mutation(s) within the ?areas  targeted by this assay, and inadequate number of viral ?copies(<138 copies/mL). A negative result must be combined with ?clinical observations, patient history, and epidemiological ?information. The expected result is Negative. ? ?Fact Sheet for Patients:  ?EntrepreneurPulse.com.au ? ?Fact Sheet for Healthcare Providers:  ?IncredibleEmployment.be ? ?This test is no t yet approved or cleared by the [place] FDA and  ?has been authorized for detection and/or diagnosis of SARS-CoV-2 by ?FDA under an Emergency Use Authorization (EUA). This EUA will remain  ?in effect (meaning this test can be used) for the duration of the ?COVID-19 declaration under Section 564(b)(1) of the Act, 21 ?U.S.C.section 360bbb-3(b)(1), unless the authorization is terminated  ?or revoked sooner.  ? ? ?  ? Influenza A by PCR NEGATIVE NEGATIVE Final  ? Influenza B  by PCR NEGATIVE NEGATIVE Final  ?  Comment: (NOTE) ?The Xpert Xpress SARS-CoV-2/FLU/RSV plus assay is intended as an aid ?in the diagnosis of influenza from Nasopharyngeal swab specimens and ?should not be used as a sole basis for treatment. Nasal washings and ?aspirates are unacceptable for Xpert Xpress SARS-CoV-2/FLU/RSV ?testing. ? ?Fact Sheet for Patients: ?EntrepreneurPulse.com.au ? ?Fact Sheet for Healthcare Providers: ?IncredibleEmployment.be ? ?This test is not yet approved or cleared by the [place] FDA and ?has been authorized for detection and/or diagnosis of SARS-CoV-2 by ?FDA under an Emergency Use Authorization (EUA). This EUA will remain ?in effect (meaning this test can be used) for the duration of the ?COVID-19 declaration under Section 564(b)(1) of the Act, 21 U.S.C. ?section 360bbb-3(b)(1), unless the authorization is terminated or ?revoked. ? ?Performed at [facility], [place], ?[place] [address] ?  ?Blood Culture (routine x 2)     Status: None  ? Collection  Time: 10/09/21  1:45 PM  ? Specimen: BLOOD RIGHT HAND  ?Result Value Ref Range Status  ? Specimen Description BLOOD RIGHT HAND  Final  ? Special Requests   Final  ?  BOTTLES DRAWN AEROBIC AND ANAEROBIC [place]

## 2021-10-18 NOTE — Plan of Care (Signed)

## 2021-10-18 NOTE — Progress Notes (Addendum)
Physical Therapy Treatment ?Patient Details ?Name: Renee Carpenter ?MRN: [id number] ?DOB: November 17, 1925 ?Today's Date: 10/18/2021 ? ? ?History of Present Illness Pt is a 88 y/o F admitted on 10/09/21 after presenting with c/o worsening SOB associated with fatigue & AMS. Pt also c/o lower abdominal pain & urine draw was concerning for UTI. Pt is being treated for acute decompensated systolic CHF, acute hypoxic respiratory failure, & sepsis 2/2 UTI. PMH: systolic CHF, CKD stage 3A, DM2, parkinsonism, OSA, HTN ? ?  ?PT Comments  ? ? Pt seen for PT tx with pt received drowsy in bed but motivated to sit up & get off her back. Pt with nasal cannula in nose but not hooked to wall so PT reattached it, SpO2 as low as 84% but increases to >/= 90% with cuing for pursed lip breathing. Pt requires max assist for supine<>sit with HOB elevated & bed rails with pt reporting it feels good to sit up on EOB, which she does so ~5 minutes with min assist. Pt endorses sore buttocks so PT positioned pillow under L hip & notified nurse of need to reposition pt in bed. Pt is optimistic today stating "I think I'm going to get better, I didn't think I was at first" with PT providing encouragement. ?   ?Recommendations for follow up therapy are one component of a multi-disciplinary discharge planning process, led by the attending physician.  Recommendations may be updated based on patient status, additional functional criteria and insurance authorization. ? ?Follow Up Recommendations ? Skilled nursing-short term rehab (<3 hours/day) ?  ?  ?Assistance Recommended at Discharge Frequent or constant Supervision/Assistance  ?Patient can return home with the following Assistance with feeding;Two people to help with walking and/or transfers;Two people to help with bathing/dressing/bathroom ?  ?Equipment Recommendations ? None recommended by PT  ?  ?Recommendations for Other Services   ? ? ?  ?Precautions / Restrictions Precautions ?Precautions:  Fall ?Precaution Comments: monitor vitals ?Restrictions ?Weight Bearing Restrictions: No  ?  ? ?Mobility ? Bed Mobility ?Overal bed mobility: Needs Assistance ?Bed Mobility: Supine to Sit, Sit to Supine ?  ?  ?Supine to sit: Max assist, HOB elevated ?Sit to supine: Max assist, HOB elevated ?  ?General bed mobility comments: use of bed rail ?  ? ?Transfers ?  ?  ?  ?  ?  ?  ?  ?  ?  ?  ?  ? ?Ambulation/Gait ?  ?  ?  ?  ?  ?  ?  ?  ? ? ?Stairs ?  ?  ?  ?  ?  ? ? ?Wheelchair Mobility ?  ? ?Modified Rankin (Stroke Patients Only) ?  ? ? ?  ?Balance Overall balance assessment: Needs assistance ?Sitting-balance support: Feet supported, Bilateral upper extremity supported ?Sitting balance-Leahy Scale: Poor ?  ?  ?  ?  ?  ?  ?  ?  ?  ?  ?  ?  ?  ?  ?  ?  ?  ? ?  ?Cognition Arousal/Alertness: Awake/alert ?Behavior During Therapy: Flat affect ?Overall Cognitive Status: Within Functional Limits for tasks assessed ?  ?  ?  ?  ?  ?  ?  ?  ?  ?  ?  ?  ?  ?  ?  ?  ?General Comments: Pt initially drowsy but motivated to participate & wishing to sit up. ?  ?  ? ?  ?Exercises   ? ?  ?General Comments   ?  ?  ? ?  Pertinent Vitals/Pain Pain Assessment ?Pain Assessment: Faces ?Faces Pain Scale: Hurts little more ?Pain Location: buttocks ?Pain Descriptors / Indicators: Sore ?Pain Intervention(s): Monitored during session, Repositioned (notified nurse of need to reposition pt) ?Pt also c/o unrated HA that was monitored during session  ? ? ?Home Living   ?  ?  ?  ?  ?  ?  ?  ?  ?  ?   ?  ?Prior Function    ?  ?  ?   ? ?PT Goals (current goals can now be found in the care plan section) Acute Rehab PT Goals ?Patient Stated Goal: get better, go home ?PT Goal Formulation: With patient ?Time For Goal Achievement: 10/25/21 ?Potential to Achieve Goals: Good ?Progress towards PT goals: Progressing toward goals ? ?  ?Frequency ? ? ? Min 2X/week ? ? ? ?  ?PT Plan Current plan remains appropriate  ? ? ?Co-evaluation   ?  ?  ?  ?  ? ?  ?AM-PAC PT "6  Clicks" Mobility   ?Outcome Measure ? Help needed turning from your back to your side while in a flat bed without using bedrails?: A Lot ?Help needed moving from lying on your back to sitting on the side of a flat bed without using bedrails?: Total ?Help needed moving to and from a bed to a chair (including a wheelchair)?: Total ?Help needed standing up from a chair using your arms (e.g., wheelchair or bedside chair)?: Total ?Help needed to walk in hospital room?: Total ?Help needed climbing 3-5 steps with a railing? : Total ?6 Click Score: 7 ? ?  ?End of Session Equipment Utilized During Treatment: Oxygen ?Activity Tolerance: Patient tolerated treatment well ?Patient left: in bed;with call bell/phone within reach;with bed alarm set ?Nurse Communication: Mobility status (O2) ?PT Visit Diagnosis: Difficulty in walking, not elsewhere classified (R26.2);Muscle weakness (generalized) (M62.81);Other abnormalities of gait and mobility (R26.89) ?  ? ? ?Time: [id number] ?PT Time Calculation (min) (ACUTE ONLY): 13 min ? ?Charges:  $Therapeutic Activity: 8-22 mins          ?          ? ?Lavone Nian, PT, DPT ?10/18/21, 1:01 PM ? ? ?Waunita Schooner ?10/18/2021, 12:48 PM ? ?

## 2021-10-18 NOTE — Progress Notes (Signed)
Pt eating ice cream and wishes not to go on CPAP @ this time ?

## 2021-10-19 DIAGNOSIS — I5023 Acute on chronic systolic (congestive) heart failure: Secondary | ICD-10-CM | POA: Diagnosis not present

## 2021-10-19 DIAGNOSIS — N179 Acute kidney failure, unspecified: Secondary | ICD-10-CM | POA: Diagnosis not present

## 2021-10-19 DIAGNOSIS — J690 Pneumonitis due to inhalation of food and vomit: Secondary | ICD-10-CM

## 2021-10-19 DIAGNOSIS — I509 Heart failure, unspecified: Secondary | ICD-10-CM | POA: Diagnosis not present

## 2021-10-19 DIAGNOSIS — R1312 Dysphagia, oropharyngeal phase: Secondary | ICD-10-CM | POA: Diagnosis not present

## 2021-10-19 DIAGNOSIS — G20 Parkinson's disease: Secondary | ICD-10-CM | POA: Diagnosis not present

## 2021-10-19 DIAGNOSIS — I5022 Chronic systolic (congestive) heart failure: Secondary | ICD-10-CM | POA: Diagnosis not present

## 2021-10-19 DIAGNOSIS — E1140 Type 2 diabetes mellitus with diabetic neuropathy, unspecified: Secondary | ICD-10-CM | POA: Diagnosis not present

## 2021-10-19 DIAGNOSIS — R29898 Other symptoms and signs involving the musculoskeletal system: Secondary | ICD-10-CM | POA: Diagnosis not present

## 2021-10-19 DIAGNOSIS — J449 Chronic obstructive pulmonary disease, unspecified: Secondary | ICD-10-CM | POA: Diagnosis not present

## 2021-10-19 DIAGNOSIS — R5381 Other malaise: Secondary | ICD-10-CM | POA: Diagnosis not present

## 2021-10-19 DIAGNOSIS — R778 Other specified abnormalities of plasma proteins: Secondary | ICD-10-CM

## 2021-10-19 DIAGNOSIS — Z981 Arthrodesis status: Secondary | ICD-10-CM | POA: Diagnosis not present

## 2021-10-19 DIAGNOSIS — E089 Diabetes mellitus due to underlying condition without complications: Secondary | ICD-10-CM | POA: Diagnosis not present

## 2021-10-19 DIAGNOSIS — Z7401 Bed confinement status: Secondary | ICD-10-CM | POA: Diagnosis not present

## 2021-10-19 DIAGNOSIS — E119 Type 2 diabetes mellitus without complications: Secondary | ICD-10-CM | POA: Diagnosis not present

## 2021-10-19 DIAGNOSIS — D631 Anemia in chronic kidney disease: Secondary | ICD-10-CM | POA: Diagnosis not present

## 2021-10-19 DIAGNOSIS — G2581 Restless legs syndrome: Secondary | ICD-10-CM | POA: Diagnosis not present

## 2021-10-19 DIAGNOSIS — J9601 Acute respiratory failure with hypoxia: Secondary | ICD-10-CM | POA: Diagnosis not present

## 2021-10-19 DIAGNOSIS — N183 Chronic kidney disease, stage 3 unspecified: Secondary | ICD-10-CM

## 2021-10-19 DIAGNOSIS — N1832 Chronic kidney disease, stage 3b: Secondary | ICD-10-CM | POA: Diagnosis not present

## 2021-10-19 DIAGNOSIS — N2581 Secondary hyperparathyroidism of renal origin: Secondary | ICD-10-CM | POA: Diagnosis not present

## 2021-10-19 DIAGNOSIS — I10 Essential (primary) hypertension: Secondary | ICD-10-CM | POA: Diagnosis not present

## 2021-10-19 LAB — BASIC METABOLIC PANEL
Anion gap: 9 (ref 5–15)
BUN: 35 mg/dL — ABNORMAL HIGH (ref 8–23)
CO2: 32 mmol/L (ref 22–32)
Calcium: 8.6 mg/dL — ABNORMAL LOW (ref 8.9–10.3)
Chloride: 101 mmol/L (ref 98–111)
Creatinine, Ser: 1.72 mg/dL — ABNORMAL HIGH (ref 0.44–1.00)
GFR, Estimated: 27 mL/min — ABNORMAL LOW (ref >60)
Glucose, Bld: 149 mg/dL — ABNORMAL HIGH (ref 70–99)
Potassium: 4.8 mmol/L (ref 3.5–5.1)
Sodium: 142 mmol/L (ref 135–145)

## 2021-10-19 LAB — GLUCOSE, CAPILLARY
Glucose-Capillary: 211 mg/dL — ABNORMAL HIGH (ref 70–99)
Glucose-Capillary: 149 mg/dL — ABNORMAL HIGH (ref 70–99)

## 2021-10-19 MED ORDER — POLYETHYLENE GLYCOL 3350 17 G PO PACK: 17.0000 g | PACK | Freq: Every day | ORAL | 0 refills | Status: AC

## 2021-10-19 MED ORDER — SENNOSIDES-DOCUSATE SODIUM 8.6-50 MG PO TABS: 1.0000 | ORAL_TABLET | Freq: Every day | ORAL | Status: AC

## 2021-10-19 MED ORDER — MAGIC MOUTHWASH W/LIDOCAINE: 10.0000 mL | Freq: Three times a day (TID) | ORAL | 0 refills | Status: DC | PRN

## 2021-10-19 MED ORDER — GLIMEPIRIDE 2 MG PO TABS: 2.0000 mg | ORAL_TABLET | Freq: Every day | ORAL | Status: DC

## 2021-10-19 MED ORDER — FUROSEMIDE 20 MG PO TABS
20.0000 mg | ORAL_TABLET | Freq: Two times a day (BID) | ORAL | Status: DC
Start: 2021-10-19 — End: 2021-11-27

## 2021-10-19 MED ORDER — AMOXICILLIN-POT CLAVULANATE 500-125 MG PO TABS
1.0000 | ORAL_TABLET | Freq: Two times a day (BID) | ORAL | Status: DC
  Administered 2021-10-19: 500 mg via ORAL
  Filled 2021-10-19: qty 1

## 2021-10-19 MED ORDER — AMOXICILLIN-POT CLAVULANATE 500-125 MG PO TABS
1.0000 | ORAL_TABLET | Freq: Two times a day (BID) | ORAL | 0 refills | Status: AC
Start: 2021-10-19 — End: 2021-10-23

## 2021-10-19 MED ORDER — METOPROLOL TARTRATE 25 MG PO TABS: 12.5000 mg | ORAL_TABLET | Freq: Two times a day (BID) | ORAL | Status: AC

## 2021-10-19 MED ORDER — AMOXICILLIN-POT CLAVULANATE 500-125 MG PO TABS: 1.0000 | ORAL_TABLET | Freq: Two times a day (BID) | ORAL | Status: DC

## 2021-10-19 MED ORDER — GABAPENTIN 250 MG/5ML PO SOLN: 200.0000 mg | Freq: Three times a day (TID) | ORAL | 12 refills | Status: DC

## 2021-10-19 MED ORDER — OXYBUTYNIN CHLORIDE 5 MG PO TABS: 5.0000 mg | ORAL_TABLET | Freq: Three times a day (TID) | ORAL | Status: DC | PRN

## 2021-10-19 MED ORDER — GUAIFENESIN-DM 100-10 MG/5ML PO SYRP: 5.0000 mL | ORAL_SOLUTION | ORAL | 0 refills | Status: DC | PRN

## 2021-10-19 MED ORDER — AMOXICILLIN-POT CLAVULANATE 500-125 MG PO TABS
1.0000 | ORAL_TABLET | Freq: Two times a day (BID) | ORAL | Status: DC
  Filled 2021-10-19: qty 1

## 2021-10-19 NOTE — TOC Transition Note (Signed)
Transition of Care (TOC) - CM/SW Discharge Note ? ? ?Patient Details  ?Name: Renee Carpenter ?MRN: [id number] ?Date of Birth: [birth year] ? ?Transition of Care (TOC) CM/SW Contact:  ?Truddie Hidden, RN ?Phone Number: ?10/19/2021, 12:50 PM ? ? ?Clinical Narrative:    ?POA notified of discharge today. Discharge summary and SNF transfer report sent in HUB. Nurse notified of room number and where to call report. EMS packet on chart. TOC signing off. ? ? ?  ?  ? ? ?Patient Goals and CMS Choice ?  ?  ?  ? ?Discharge Placement ?  ?           ?  ?  ?  ?  ? ?Discharge Plan and Services ?  ?  ?           ?  ?  ?  ?  ?  ?  ?  ?  ?  ?  ? ?Social Determinants of Health (SDOH) Interventions ?  ? ? ?Readmission Risk Interventions ?   ? View : No data to display.  ?  ?  ?  ? ? ? ? ? ?

## 2021-10-19 NOTE — Progress Notes (Signed)
Report given to RN Kim LPN at [facility].  ? ?

## 2021-10-19 NOTE — Plan of Care (Signed)
  Problem: Elimination: Goal: Will not experience complications related to bowel motility Outcome: Progressing Goal: Will not experience complications related to urinary retention Outcome: Progressing   

## 2021-10-19 NOTE — Progress Notes (Signed)
?[facility]  ?ROUNDING NOTE  ? ?Subjective:  ?Patient is a 88 year old Caucasian female with past medical history of chronic systolic CHF with ejection fraction of 25 to 30%, CKD stage IIIb, diabetes mellitus type 2, Parkinson's, obstructive sleep apnea, hypertension admitted with chief complaints of shortness of breath. ? ?Patient was seen and examined at the bedside.  She is awake, and following basic commands, Noted to be lethargic.  She is currently on nasal cannula oxygen 4 L.  ? ?Objective:  ?Vital signs in last 24 hours:  ?Temp:  [97.8 ?F (36.6 ?C)-97.9 ?F (36.6 ?C)] 97.8 ?F (36.6 ?C) (05/15 0730) ?Pulse Rate:  [80-83] 83 (05/15 0730) ?Resp:  [18] 18 (05/15 0730) ?BP: (118-141)/(61-73) 130/63 (05/15 0730) ?SpO2:  [95 %-100 %] 96 % (05/15 0730) ?Weight:  [67.2 kg] 67.2 kg (05/15 0500) ? ?Weight change:  ?Filed Weights  ? 10/16/21 1016 10/17/21 0500 10/19/21 0500  ?Weight: 67.9 kg 67.3 kg 67.2 kg  ? ? ?Intake/Output: ?I/O last 3 completed shifts: ?In: 431.2 [P.O.:120; I.V.:11.2; IV Piggyback:300] ?Out: 1950 [Urine:1950] ?  ?Intake/Output this shift: ? Total I/O ?In: -  ?Out: 400 [Urine:400] ? ?Physical Exam: ?General: No acute distress  ?Head: Normocephalic, atraumatic. Moist oral mucosal membranes  ?Eyes: Anicteric  ?Neck: Supple  ?Lungs:  Diminished bilateral bases, mild expiratory wheezing  ?Heart: S1S2 no rubs  ?Abdomen:  Soft, nontender, bowel sounds present  ?Extremities: Trace edema in extremities  ?Neurologic: Awake, alert, following commands  ?Skin: No acute rash  ?Access:   ? ? ?Basic Metabolic Panel: ?Recent Labs  ?Lab 10/13/21 ?0752 10/14/21 ?[id number] 10/15/21 ?0930 10/16/21 ?0428 10/17/21 ?0456 10/18/21 ?0351 10/19/21 ?[id number]  ?NA 139 139 138  --   --   --  142  ?K 4.3 4.5 4.2  --  4.5  --  4.8  ?CL 102 103 103  --   --   --  101  ?CO2 27 27 25   --   --   --  32  ?GLUCOSE 184* 168* 174*  --   --   --  149*  ?BUN 71* 72* 65*  --   --   --  35*  ?CREATININE 2.52* 2.30* 2.13* 1.94* 1.85* 1.62*  1.72*  ?CALCIUM 8.6*  8.6* 8.6* 8.4*  --   --   --  8.6*  ?PHOS 3.5  --  2.9  --   --   --   --   ? ? ?Liver Function Tests: ?Recent Labs  ?Lab 10/15/21 ?0930  ?ALBUMIN 2.9*  ? ?No results for input(s): LIPASE, AMYLASE in the last 168 hours. ?No results for input(s): AMMONIA in the last 168 hours. ? ?CBC: ?Recent Labs  ?Lab 10/13/21 ?0752 10/14/21 ?[id number] 10/15/21 ?[id number] 10/15/21 ?1117 10/16/21 ?[id number] 10/17/21 ?[id number] 10/18/21 ?0351  ?WBC 10.2 10.4 16.4* 13.4* 13.7* 15.0* 15.2*  ?NEUTROABS 7.0 6.6 8.7*  --  8.5* 7.9*  --   ?HGB 12.4 12.9 13.3 13.8 12.0 12.6 12.1  ?HCT 36.9 37.5 38.6 39.4 35.3* 36.9 35.3*  ?MCV 113.5* 112.6* 115.6* 121.2* 112.8* 114.2* 113.1*  ?PLT 289 289 258 215 273 267 267  ? ? ?Cardiac Enzymes: ?No results for input(s): CKTOTAL, CKMB, CKMBINDEX, TROPONINI in the last 168 hours. ? ?BNP: ?Invalid input(s): POCBNP ? ?CBG: ?Recent Labs  ?Lab 10/18/21 ?1115 10/18/21 ?1625 10/18/21 ?2204 10/19/21 ?[id number] 10/19/21 ?1143  ?GLUCAP 204* 186* 163* 211* 149*  ? ? ?Microbiology: ?Results for orders placed or performed during the hospital encounter of 10/09/21  ?Resp Panel by  RT-PCR (Flu A&B, Covid) Nasopharyngeal Swab     Status: None  ? Collection Time: 10/09/21  1:45 PM  ? Specimen: Nasopharyngeal Swab; Nasopharyngeal(NP) swabs in vial transport medium  ?Result Value Ref Range Status  ? SARS Coronavirus 2 by RT PCR NEGATIVE NEGATIVE Final  ?  Comment: (NOTE) ?SARS-CoV-2 target nucleic acids are NOT DETECTED. ? ?The SARS-CoV-2 RNA is generally detectable in upper respiratory ?specimens during the acute phase of infection. The lowest ?concentration of SARS-CoV-2 viral copies this assay can detect is ?138 copies/mL. A negative result does not preclude SARS-Cov-2 ?infection and should not be used as the sole basis for treatment or ?other patient management decisions. A negative result may occur with  ?improper specimen collection/handling, submission of specimen other ?than nasopharyngeal swab, presence of viral  mutation(s) within the ?areas targeted by this assay, and inadequate number of viral ?copies(<138 copies/mL). A negative result must be combined with ?clinical observations, patient history, and epidemiological ?information. The expected result is Negative. ? ?Fact Sheet for Patients:  ?EntrepreneurPulse.com.au ? ?Fact Sheet for Healthcare Providers:  ?IncredibleEmployment.be ? ?This test is no t yet approved or cleared by the [place] FDA and  ?has been authorized for detection and/or diagnosis of SARS-CoV-2 by ?FDA under an Emergency Use Authorization (EUA). This EUA will remain  ?in effect (meaning this test can be used) for the duration of the ?COVID-19 declaration under Section 564(b)(1) of the Act, 21 ?U.S.C.section 360bbb-3(b)(1), unless the authorization is terminated  ?or revoked sooner.  ? ? ?  ? Influenza A by PCR NEGATIVE NEGATIVE Final  ? Influenza B by PCR NEGATIVE NEGATIVE Final  ?  Comment: (NOTE) ?The Xpert Xpress SARS-CoV-2/FLU/RSV plus assay is intended as an aid ?in the diagnosis of influenza from Nasopharyngeal swab specimens and ?should not be used as a sole basis for treatment. Nasal washings and ?aspirates are unacceptable for Xpert Xpress SARS-CoV-2/FLU/RSV ?testing. ? ?Fact Sheet for Patients: ?EntrepreneurPulse.com.au ? ?Fact Sheet for Healthcare Providers: ?IncredibleEmployment.be ? ?This test is not yet approved or cleared by the [place] FDA and ?has been authorized for detection and/or diagnosis of SARS-CoV-2 by ?FDA under an Emergency Use Authorization (EUA). This EUA will remain ?in effect (meaning this test can be used) for the duration of the ?COVID-19 declaration under Section 564(b)(1) of the Act, 21 U.S.C. ?section 360bbb-3(b)(1), unless the authorization is terminated or ?revoked. ? ?Performed at [facility], [place], ?[place] [address] ?  ?Blood Culture (routine x 2)      Status: None  ? Collection Time: 10/09/21  1:45 PM  ? Specimen: BLOOD RIGHT HAND  ?Result Value Ref Range Status  ? Specimen Description BLOOD RIGHT HAND  Final  ? Special Requests   Final  ?  BOTTLES DRAWN AEROBIC AND ANAEROBIC Blood Culture adequate volume  ? Culture   Final  ?  NO GROWTH 5 DAYS ?Performed at [facility], [address] [place], [place] [address] ?  ? Report Status 10/14/2021 FINAL  Final  ?Blood Culture (routine x 2)     Status: None  ? Collection Time: 10/09/21  1:45 PM  ? Specimen: BLOOD  ?Result Value Ref Range Status  ? Specimen Description BLOOD LEFT ANTECUBITAL  Final  ? Special Requests   Final  ?  BOTTLES DRAWN AEROBIC AND ANAEROBIC Blood Culture adequate volume  ? Culture   Final  ?  NO GROWTH 5 DAYS ?Performed at [facility], [address], [place], [place] [phone] ?  ?  Report Status 10/14/2021 FINAL  Final  ?MRSA Next Gen by PCR, Nasal     Status: None  ? Collection Time: 10/10/21  9:21 AM  ? Specimen: Nasal Mucosa; Nasal Swab  ?Result Value Ref Range Status  ? MRSA by PCR Next Gen NOT DETECTED NOT DETECTED Final  ?  Comment: (NOTE) ?The GeneXpert MRSA Assay (FDA approved for NASAL specimens only), ?is one component of a comprehensive MRSA colonization surveillance ?program. It is not intended to diagnose MRSA infection nor to guide ?or monitor treatment for MRSA infections. ?Test performance is not FDA approved in patients less than 2 years ?old. ?Performed at [facility], [place], ?[place] [address] ?  ? ? ?Coagulation Studies: ?No results for input(s): LABPROT, INR in the last 72 hours. ? ?Urinalysis: ?No results for input(s): COLORURINE, LABSPEC, [place], GLUCOSEU, HGBUR, BILIRUBINUR, KETONESUR, PROTEINUR, UROBILINOGEN, NITRITE, LEUKOCYTESUR in the last 72 hours. ? ?Invalid input(s): APPERANCEUR  ? ? ?Imaging: ?DG Chest 1 View ? ?Result Date: 10/17/2021 ?CLINICAL DATA:  Acute tubular necrosis EXAM: CHEST  1 VIEW COMPARISON:  Chest  x-ray 10/15/2021 FINDINGS: Cardiomediastinal silhouette is stable and within normal limits. Calcified plaques in the aortic arch. Persistent opacities at the left lung base with small left pleural effusion.

## 2021-10-19 NOTE — TOC Progression Note (Signed)
Transition of Care (TOC) - Progression Note  ? ? ?Patient Details  ?Name: Renee Carpenter ?MRN: [id number] ?Date of Birth: [birth year] ? ?Transition of Care (TOC) CM/SW Contact  ?Truddie Hidden, RN ?Phone Number: ?10/19/2021, 9:27 AM ? ?Clinical Narrative:    ? Spoke with [facility]. [place] 5/13. Facility is still showing OON. Facility to contact patient to review cost. Spoke with [employer]. Admission would not be accepted before 5/15.   ? ? ?  ?  ? ?Expected Discharge Plan and Services ?  ?  ?  ?  ?  ?                ?  ?  ?  ?  ?  ?  ?  ?  ?  ?  ? ? ?Social Determinants of Health (SDOH) Interventions ?  ? ?Readmission Risk Interventions ?   ? View : No data to display.  ?  ?  ?  ? ? ?

## 2021-10-19 NOTE — Discharge Summary (Addendum)
Renee Carpenter MRN:[id number] DOB: 07/24/1925 DOA: 10/09/2021 ? ?PCP: Gilbert, Richard L Jr., MD ? ?Admit date: 10/09/2021 ?Discharge date: 10/19/2021 ? ?Admitted From: Home ?Disposition: SNF ? ?Recommendations for Outpatient Follow-up:  ?Follow up with PCP in 1 week ?Please obtain BMP/CBC in one week ? ? ? ? ?Discharge Condition:Stable ?CODE STATUS:DNR ?Diet recommendation: Heart Healthy / Carb Modified Needs feeding assistance. Chop and moisten meats with extra sauce/gravy. Thin liquids allowed; please also send 2 nectar-thick liquids on each tray. ?Brief/Interim Summary: ?PER HPI:  Renee Carpenter is a 88 y.o. female with medical history significant of systolic congestive heart failure EF 25 to 30%, CKD stage IIIa, type 2 diabetes mellitus, parkinsonism, obstructive sleep apnea, hypertension presents for evaluation of progressively worsening shortness of breath associated with fatigue and altered mentation.  [name] is compliance with all medications.No home oxygen requirement.  On presentation brought in by EMS on 4 L nasal cannula after being found hypoxic at home.  Patient is a poor historian.  History obtained from multiple sources including the patient, patient's husband, ER physician, ER documentation. Patient was febrile with Tmax 102.4.  Patient has lab work including elevated BNP and chest x-ray that demonstrates evidence of interstitial edema.  Remainder vital signs are stable. ?  ?ED Course: On presentation patient was found to be hypoxic.  Laboratory investigation significant findings as above.  Patient received 60 mg IV Lasix for suspected decompensated heart failure.  Received 1 g IV Rocephin.  Minimal troponin elevation but no chest pain.  EKG without ischemic changes.  Hospitalist contacted for admission.  Patient was admitted to the hospital for further evaluation. ?Her mental status improved. ? ? ? ? ?Acute decompensated systolic congestive heart failure ?Known recent EF from February 14  was 25 to 30% ?Continue current management.  ?Monitor electrolytes and renal function on lasix ?Follow-up with cardiology outpatient [facility] ?I's and O's ?Daily weight ?  ?  ?  ?  ?  ?Acute hypoxic respiratory failure ?Patient on room air at baseline ?Was treated for aspiration pna as well as CHF. ?Suspect secondary to interstitial edema versus possible infectious process versus OSA, requiring 3-4 LNC now ?Need to use incentive spirometer ? ? ?  ?Aspiration PNA ?Was on IV antibiotics will switch to Augmentin with last date of completion 10/23/2021 ?First dose given in the hospital, second dose today due at 2200 ?  ?  ?Elevated troponin ?Likely demand ischemia ?  ?  ?  ?  ?  ?  ?AKI on CKD stage IIIb ?Baseline creatinine difficult ascertain ?Avoid nonessential nephrotoxins ?Nephrology was consulted ?Avoid hypotension ?No indication for HD at this time ?Check electrolytes and renal function on Lasix. ?Should have be BMP checked in 2 days ?  ?  ?  ?  ?  ?Urinary tract infection ?Sepsis secondary to above ?Bladder spasms ?Unable to exclude sepsis ?Was treated with antibiotics ?Needs voiding trial in 1-2 days. ? ?  ?  ?Type 2 diabetes mellitus with hyperglycemia and neuropathic pain ?Continue home agents ?Dosing adjusted per chronic kidney disease ?  ?Obstructive sleep apnea ?Patient on home CPAP but compliance poor due to difficulty with mask ?Nightly CPAP as tolerated ?  ?Parkinsonism ?Restless leg ?Continue Requip and Sinemet  ? ? ?  ?GERD. ?PPI ?  ?Depression ?Anxiety ? continue Cymbalta ? ? ? ? ? ?Discharge Diagnoses:  ?Principal Problem: ?  Acute decompensated heart failure (HCC) ?Active Problems: ?  Acute respiratory failure with hypoxia (HCC) ?  Anxiety, generalized ?    Restless leg ?  Depression ?  Elevated troponin ?  Aspiration pneumonia (HCC) ?  AKI (acute kidney injury) (HCC) ?  CKD (chronic kidney disease), stage III (HCC) ? ? ? ?Discharge Instructions ? ?Discharge Instructions   ? ? AMB referral to  CHF clinic   Complete by: As directed ?  ? Diet - low sodium heart healthy   Complete by: As directed ?  ? Increase activity slowly   Complete by: As directed ?  ? ?  ? ?Allergies as of 10/19/2021   ? ?   Reactions  ? Acetaminophen Itching, Other (See Comments)  ? Stomach upset  ? Aspirin Itching, Other (See Comments)  ? Stomach pain  ? Metformin And Related   ? nausea  ? Milk-related Compounds Diarrhea  ? Other Other (See Comments)  ? Novocaine, strange feeling  ? Sulfa Antibiotics Other (See Comments)  ? Sulfasalazine   ? Other reaction(s): Other (See Comments)  ? Tizanidine   ? Diarrhea, almost passed out, felt dizzy and confused  ? Caffeine Palpitations  ? Ginger Palpitations  ? Ibuprofen Rash  ? ?  ? ?  ?Medication List  ?  ? ?STOP taking these medications   ? ?gabapentin 600 MG tablet ?Commonly known as: NEURONTIN ?Replaced by: gabapentin 250 MG/5ML solution ?  ?metoprolol succinate 25 MG 24 hr tablet ?Commonly known as: TOPROL-XL ?  ?pregabalin 50 MG capsule ?Commonly known as: LYRICA ?  ?tamsulosin 0.4 MG Caps capsule ?Commonly known as: FLOMAX ?  ? ?  ? ?TAKE these medications   ? ?Accu-Chek FastClix Lancets Misc ?Just check first thing in the morning to ensure no low blood sugar. ?  ?Accu-Chek Guide test strip ?Generic drug: glucose blood ?Just check first thing in the morning to ensure no low blood sugar. ?  ?Accu-Chek Guide w/Device Kit ?Check blood sugars daily as directed ?  ?albuterol 108 (90 Base) MCG/ACT inhaler ?Commonly known as: VENTOLIN HFA ?INHALE 2 PUFFS INTO THE LUNGS EVERY 4 HOURS AS NEEDED FOR WHEEZING OR SHORTNESS OF BREATH ?  ?ALIGN PO ?Take 1 capsule by mouth daily. ?  ?amoxicillin-clavulanate 500-125 MG tablet ?Commonly known as: AUGMENTIN ?Take 1 tablet (500 mg total) by mouth every 12 (twelve) hours for 4 days. ?  ?blood glucose meter kit and supplies Kit ?Just check first thing in the morning to ensure no low blood sugar. ?  ?carbidopa-levodopa 25-100 MG tablet ?Commonly known as:  SINEMET IR ?Take 1 tablet by mouth 3 (three) times daily. Take 1 tablet three times a day ?  ?Co Q10 200 MG Caps ?Take 1 capsule by mouth daily. ?  ?DULoxetine 30 MG capsule ?Commonly known as: CYMBALTA ?TAKE 1 CAPSULE(30 MG) BY MOUTH DAILY ?What changed: Another medication with the same name was removed. Continue taking this medication, and follow the directions you see here. ?  ?empagliflozin 10 MG Tabs tablet ?Commonly known as: JARDIANCE ?Take by mouth daily. ?  ?esomeprazole 40 MG capsule ?Commonly known as: NEXIUM ?TAKE 1 CAPSULE BY MOUTH EVERY DAY ?  ?fluticasone 50 MCG/ACT nasal spray ?Commonly known as: FLONASE ?Place 2 sprays into both nostrils daily. ?  ?furosemide 20 MG tablet ?Commonly known as: LASIX ?Take 1 tablet (20 mg total) by mouth 2 (two) times daily. ?What changed:  ?medication strength ?how much to take ?when to take this ?  ?gabapentin 250 MG/5ML solution ?Commonly known as: NEURONTIN ?Take 4 mLs (200 mg total) by mouth every 8 (eight) hours. ?Replaces: gabapentin 600 MG tablet ?  ?  glimepiride 2 MG tablet ?Commonly known as: AMARYL ?Take 1 tablet (2 mg total) by mouth daily with breakfast. ?Start taking on: Oct 20, 2021 ?What changed:  ?medication strength ?See the new instructions. ?  ?guaiFENesin-dextromethorphan 100-10 MG/5ML syrup ?Commonly known as: ROBITUSSIN DM ?Take 5 mLs by mouth every 4 (four) hours as needed for cough. ?  ?hydroxypropyl methylcellulose / hypromellose 2.5 % ophthalmic solution ?Commonly known as: ISOPTO TEARS / GONIOVISC ?Place 1 drop into both eyes 3 (three) times daily as needed. Dry eyes ?  ?magic mouthwash w/lidocaine Soln ?Take 10 mLs by mouth 3 (three) times daily as needed for mouth pain. ?  ?MAGnesium-Oxide 400 (241.3 Mg) MG tablet ?Generic drug: magnesium oxide ?TAKE 1 TABLET BY MOUTH TWICE DAILY ?  ?metoprolol tartrate 25 MG tablet ?Commonly known as: LOPRESSOR ?Take 0.5 tablets (12.5 mg total) by mouth 2 (two) times daily. ?  ?MULTIPLE VITAMIN PO ?Take 1  tablet by mouth daily. ?  ?mupirocin ointment 2 % ?Commonly known as: BACTROBAN ?Apply topically 2 (two) times daily. ?  ?oxybutynin 5 MG tablet ?Commonly known as: DITROPAN ?Take 1 tablet (5 mg total) by m

## 2021-10-19 NOTE — TOC Progression Note (Addendum)
Transition of Care (TOC) - Progression Note  ? ? ?Patient Details  ?Name: Renee Carpenter ?MRN: [id number] ?Date of Birth: [birth year] ? ?Transition of Care (TOC) CM/SW Contact  ?Truddie Hidden, RN ?Phone Number: ?10/19/2021, 10:24 AM ? ?Clinical Narrative:    ?12:04pm Spoke with Letitia Libra, POA  to advised of discharge today to [facility].  ? ?Spoke with Elita Quick at [facility] @11 :40 am. stated she Spokew ith a representative from [place] [facility] regarding an auth sent 5/11 Auth 7/11. Stated Patient authorized. ? ?Spoke with Pam at #[id number]. Pam stated she spoke with POA [employer] and confirmed patient and POA is aware of higher out of pocket cost and is agreeable to  discharge to SNF. Attempt to reach POA twice  via phone and visited room. Unable to reach POA.  ? ? ?  ?  ? ?Expected Discharge Plan and Services ?  ?  ?  ?  ?  ?                ?  ?  ?  ?  ?  ?  ?  ?  ?  ?  ? ? ?Social Determinants of Health (SDOH) Interventions ?  ? ?Readmission Risk Interventions ?   ? View : No data to display.  ?  ?  ?  ? ? ?

## 2021-10-19 NOTE — Progress Notes (Signed)
Occupational Therapy Treatment ?Patient Details ?Name: Renee Carpenter ?MRN: [id number] ?DOB: 01-10-1926 ?Today's Date: 10/19/2021 ? ? ?History of present illness Pt is a 88 y/o F admitted on 10/09/21 after presenting with c/o worsening SOB associated with fatigue & AMS. Pt also c/o lower abdominal pain & urine draw was concerning for UTI. Pt is being treated for acute decompensated systolic CHF, acute hypoxic respiratory failure, & sepsis 2/2 UTI. PMH: systolic CHF, CKD stage 3A, DM2, parkinsonism, OSA, HTN ?  ?OT comments ? Upon entering the room, pt supine in bed with visitor present. Pt is very sleepy and needing increased cuing for initial participation. Pt washes face and hands with set up A. Supine >sit with max A to EOB. Pt sitting with min A secondary to posterior bias with visitor combing her hair while up. Pt is agreeable to attempt standing with max A of 1 and use of RW. Pt reports possible BM while standing and returned to EOB. She was able to stand a second time with visitor assisting with hygiene but pt appeared to still be using the bathroom. Pt assisted back to bed and placed on bed pan to attempt emptying further. All needs within reach and visitor remains in room.   ? ?Recommendations for follow up therapy are one component of a multi-disciplinary discharge planning process, led by the attending physician.  Recommendations may be updated based on patient status, additional functional criteria and insurance authorization. ?   ?Follow Up Recommendations ? Skilled nursing-short term rehab (<3 hours/day)  ?  ?Assistance Recommended at Discharge Intermittent Supervision/Assistance  ?Patient can return home with the following ? A lot of help with bathing/dressing/bathroom;A lot of help with walking and/or transfers;Assistance with cooking/housework;Assist for transportation ?  ?Equipment Recommendations ? None recommended by OT  ?  ?   ?Precautions / Restrictions Precautions ?Precautions: Fall  ? ? ?   ? ?Mobility Bed Mobility ?Overal bed mobility: Needs Assistance ?Bed Mobility: Supine to Sit, Sit to Supine ?  ?  ?Supine to sit: HOB elevated, Mod assist ?Sit to supine: HOB elevated, Mod assist ?  ?  ?  ? ?Transfers ?Overall transfer level: Needs assistance ?Equipment used: Rolling walker (2 wheels) ?Transfers: Sit to/from Stand ?Sit to Stand: Mod assist ?  ?  ?  ?  ?  ?  ?  ?  ?Balance Overall balance assessment: Needs assistance ?Sitting-balance support: Feet supported, Bilateral upper extremity supported ?Sitting balance-Leahy Scale: Fair ?Sitting balance - Comments: periods of min A with assistance for forward weight shift ?Postural control: Posterior lean ?Standing balance support: Bilateral upper extremity supported, Reliant on assistive device for balance ?Standing balance-Leahy Scale: Poor ?  ?  ?  ?  ?  ?  ?  ?  ?  ?  ?  ?  ?   ? ?ADL either performed or assessed with clinical judgement  ? ?ADL Overall ADL's : Needs assistance/impaired ?  ?  ?  ?  ?  ?  ?  ?  ?  ?  ?  ?  ?  ?  ?  ?  ?  ?  ?  ?  ?  ? ?Extremity/Trunk Assessment Upper Extremity Assessment ?Upper Extremity Assessment: Generalized weakness ?  ?Lower Extremity Assessment ?Lower Extremity Assessment: Generalized weakness ?  ?  ?  ? ?Vision Patient Visual Report: No change from baseline ?  ?  ?   ?   ? ?Cognition Arousal/Alertness: Awake/alert ?Behavior During Therapy: [facility] for tasks assessed/performed ?Overall Cognitive  Status: Within Functional Limits for tasks assessed ?  ?  ?  ?  ?  ?  ?  ?  ?  ?  ?  ?  ?  ?  ?  ?  ?General Comments: Pt initially drowsy but motivated to participate & wishing to sit up. ?  ?  ?   ?   ?   ?   ? ? ?Pertinent Vitals/ Pain       Pain Assessment ?Pain Assessment: No/denies pain ? ?   ?   ? ?Frequency ? Min 2X/week  ? ? ? ? ?  ?Progress Toward Goals ? ?OT Goals(current goals can now be found in the care plan section) ? Progress towards OT goals: Progressing toward goals ? ?Acute Rehab OT Goals ?Patient Stated Goal:  to go home ?OT Goal Formulation: With patient ?Time For Goal Achievement: 10/25/21 ?Potential to Achieve Goals: Good  ?Plan Discharge plan remains appropriate;Frequency remains appropriate   ? ?   ?AM-PAC OT "6 Clicks" Daily Activity     ?Outcome Measure ? ? Help from another person eating meals?: A Little ?Help from another person taking care of personal grooming?: A Little ?Help from another person toileting, which includes using toliet, bedpan, or urinal?: A Lot ?Help from another person bathing (including washing, rinsing, drying)?: A Lot ?Help from another person to put on and taking off regular upper body clothing?: A Little ?Help from another person to put on and taking off regular lower body clothing?: A Lot ?6 Click Score: 15 ? ?  ?End of Session Equipment Utilized During Treatment: Rolling walker (2 wheels) ? ?OT Visit Diagnosis: Other abnormalities of gait and mobility (R26.89);Muscle weakness (generalized) (M62.81) ?  ?Activity Tolerance Patient tolerated treatment well ?  ?Patient Left in bed;with bed alarm set;with call bell/phone within reach;with family/visitor present ?  ?Nurse Communication Mobility status ?  ? ?   ? ?Time: 1035-1100 ?OT Time Calculation (min): 25 min ? ?Charges: OT General Charges ?$OT Visit: 1 Visit ?OT Treatments ?$Self Care/Home Management : 8-22 mins ?$Therapeutic Activity: 8-22 mins ? ?Darleen Crocker, MS, OTR/L , CBIS ?ascom [phone]  ?10/19/21, 1:36 PM  ?

## 2021-10-20 DIAGNOSIS — N1832 Chronic kidney disease, stage 3b: Secondary | ICD-10-CM | POA: Diagnosis not present

## 2021-10-20 DIAGNOSIS — I5022 Chronic systolic (congestive) heart failure: Secondary | ICD-10-CM | POA: Diagnosis not present

## 2021-10-20 DIAGNOSIS — G20 Parkinson's disease: Secondary | ICD-10-CM | POA: Diagnosis not present

## 2021-10-20 DIAGNOSIS — J449 Chronic obstructive pulmonary disease, unspecified: Secondary | ICD-10-CM | POA: Diagnosis not present

## 2021-10-21 ENCOUNTER — Ambulatory Visit: Payer: Medicare PPO | Admitting: Family

## 2021-10-26 ENCOUNTER — Inpatient Hospital Stay: Payer: Medicare PPO | Admitting: Family Medicine

## 2021-10-26 ENCOUNTER — Ambulatory Visit: Payer: Medicare PPO | Admitting: Family Medicine

## 2021-10-27 DIAGNOSIS — J449 Chronic obstructive pulmonary disease, unspecified: Secondary | ICD-10-CM | POA: Diagnosis not present

## 2021-10-27 DIAGNOSIS — I5022 Chronic systolic (congestive) heart failure: Secondary | ICD-10-CM | POA: Diagnosis not present

## 2021-10-27 DIAGNOSIS — N1832 Chronic kidney disease, stage 3b: Secondary | ICD-10-CM | POA: Diagnosis not present

## 2021-11-11 ENCOUNTER — Other Ambulatory Visit: Payer: Self-pay | Admitting: Family Medicine

## 2021-11-11 DIAGNOSIS — G629 Polyneuropathy, unspecified: Secondary | ICD-10-CM

## 2021-11-16 DIAGNOSIS — J449 Chronic obstructive pulmonary disease, unspecified: Secondary | ICD-10-CM | POA: Diagnosis not present

## 2021-11-16 DIAGNOSIS — I5022 Chronic systolic (congestive) heart failure: Secondary | ICD-10-CM | POA: Diagnosis not present

## 2021-11-17 ENCOUNTER — Other Ambulatory Visit: Payer: Self-pay | Admitting: Family Medicine

## 2021-11-17 DIAGNOSIS — E1142 Type 2 diabetes mellitus with diabetic polyneuropathy: Secondary | ICD-10-CM

## 2021-11-19 ENCOUNTER — Ambulatory Visit: Payer: Medicare PPO | Admitting: Family Medicine

## 2021-11-19 ENCOUNTER — Encounter: Payer: Self-pay | Admitting: Family Medicine

## 2021-11-19 ENCOUNTER — Telehealth: Payer: Self-pay | Admitting: Family Medicine

## 2021-11-19 VITALS — BP 99/56

## 2021-11-19 DIAGNOSIS — I5022 Chronic systolic (congestive) heart failure: Secondary | ICD-10-CM

## 2021-11-19 DIAGNOSIS — M5137 Other intervertebral disc degeneration, lumbosacral region: Secondary | ICD-10-CM

## 2021-11-19 DIAGNOSIS — G4733 Obstructive sleep apnea (adult) (pediatric): Secondary | ICD-10-CM | POA: Diagnosis not present

## 2021-11-19 DIAGNOSIS — E1142 Type 2 diabetes mellitus with diabetic polyneuropathy: Secondary | ICD-10-CM

## 2021-11-19 DIAGNOSIS — G20 Parkinson's disease: Secondary | ICD-10-CM | POA: Diagnosis not present

## 2021-11-19 DIAGNOSIS — R262 Difficulty in walking, not elsewhere classified: Secondary | ICD-10-CM | POA: Diagnosis not present

## 2021-11-19 DIAGNOSIS — G629 Polyneuropathy, unspecified: Secondary | ICD-10-CM

## 2021-11-19 DIAGNOSIS — N1832 Chronic kidney disease, stage 3b: Secondary | ICD-10-CM | POA: Diagnosis not present

## 2021-11-19 DIAGNOSIS — I5023 Acute on chronic systolic (congestive) heart failure: Secondary | ICD-10-CM | POA: Diagnosis not present

## 2021-11-19 DIAGNOSIS — I10 Essential (primary) hypertension: Secondary | ICD-10-CM | POA: Diagnosis not present

## 2021-11-19 DIAGNOSIS — M51379 Other intervertebral disc degeneration, lumbosacral region without mention of lumbar back pain or lower extremity pain: Secondary | ICD-10-CM

## 2021-11-19 DIAGNOSIS — F411 Generalized anxiety disorder: Secondary | ICD-10-CM | POA: Diagnosis not present

## 2021-11-19 DIAGNOSIS — I890 Lymphedema, not elsewhere classified: Secondary | ICD-10-CM

## 2021-11-19 DIAGNOSIS — G2581 Restless legs syndrome: Secondary | ICD-10-CM | POA: Diagnosis not present

## 2021-11-19 DIAGNOSIS — M6281 Muscle weakness (generalized): Secondary | ICD-10-CM | POA: Diagnosis not present

## 2021-11-19 DIAGNOSIS — F3341 Major depressive disorder, recurrent, in partial remission: Secondary | ICD-10-CM

## 2021-11-19 DIAGNOSIS — G20A1 Parkinson's disease without dyskinesia, without mention of fluctuations: Secondary | ICD-10-CM

## 2021-11-19 DIAGNOSIS — R2681 Unsteadiness on feet: Secondary | ICD-10-CM | POA: Diagnosis not present

## 2021-11-19 DIAGNOSIS — R279 Unspecified lack of coordination: Secondary | ICD-10-CM | POA: Diagnosis not present

## 2021-11-19 MED ORDER — UNISTIK 3 COMFORT MISC: 1 refills | Status: AC

## 2021-11-19 MED ORDER — GABAPENTIN 400 MG PO CAPS: 400.0000 mg | ORAL_CAPSULE | Freq: Three times a day (TID) | ORAL | 1 refills | Status: AC

## 2021-11-19 NOTE — Patient Instructions (Signed)
STOP GLIMEPIRIDE AND GABAPENTIN LIQUID.

## 2021-11-19 NOTE — Progress Notes (Unsigned)
Established patient visit  I,Mical Kicklighter,acting as a scribe for [name], MD.,have documented all relevant documentation on the behalf of [name], MD,as directed by  [name], MD while in the presence of [name], MD.   Patient: Renee Carpenter   DOB: 02-25-1926   88 y.o. Female  MRN: [id number] Visit Date: 11/19/2021  Today's healthcare provider: [name], MD   Chief Complaint  Patient presents with   Hospitalization Follow-up   Subjective    HPI  Patient is a 88 year old female who presents for follow up after being hospitalized 10/09/21-10/19/21 for sepsis with acute hypoxia.  She was then admitted to skilled nursing at [facility] until 11/16/21.  Medications: Outpatient Medications Prior to Visit  Medication Sig   Accu-Chek FastClix Lancets MISC Just check first thing in the morning to ensure no low blood sugar.   albuterol (VENTOLIN HFA) 108 (90 Base) MCG/ACT inhaler INHALE 2 PUFFS INTO THE LUNGS EVERY 4 HOURS AS NEEDED FOR WHEEZING OR SHORTNESS OF BREATH   Blood Glucose Monitoring Suppl (ACCU-CHEK GUIDE) w/Device KIT Check blood sugars daily as directed   carbidopa-levodopa (SINEMET IR) 25-100 MG tablet Take 1 tablet by mouth 3 (three) times daily. Take 1 tablet three times a day   Coenzyme Q10 (CO Q10) 200 MG CAPS Take 1 capsule by mouth daily.   DULoxetine (CYMBALTA) 30 MG capsule TAKE 1 CAPSULE(30 MG) BY MOUTH DAILY   empagliflozin (JARDIANCE) 10 MG TABS tablet Take by mouth daily.   esomeprazole (NEXIUM) 40 MG capsule TAKE 1 CAPSULE BY MOUTH EVERY DAY   Fluticasone-Salmeterol (ADVAIR DISKUS IN) Inhale 2 puffs into the lungs 2 (two) times daily.   furosemide (LASIX) 20 MG tablet Take 1 tablet (20 mg total) by mouth 2 (two) times daily.   glimepiride (AMARYL) 2 MG tablet Take 1 tablet (2 mg total) by mouth daily with breakfast.   glucose blood (ACCU-CHEK GUIDE) test strip Just check first thing in the morning to  ensure no low blood sugar.   MAGNESIUM-OXIDE 400 (241.3 Mg) MG tablet TAKE 1 TABLET BY MOUTH TWICE DAILY   MULTIPLE VITAMIN PO Take 1 tablet by mouth daily.   polyethylene glycol (MIRALAX / GLYCOLAX) 17 g packet Take 17 g by mouth daily.   Probiotic Product (ALIGN PO) Take 1 capsule by mouth daily.   Propylene Glycol (SYSTANE BALANCE OP) Apply 1 drop to eye 3 (three) times daily as needed. Both eyes   rOPINIRole (REQUIP) 0.5 MG tablet Take 0.5 mg by mouth 3 (three) times daily.   senna-docusate (SENOKOT-S) 8.6-50 MG tablet Take 1 tablet by mouth at bedtime.   Tiotropium Bromide Monohydrate (SPIRIVA HANDIHALER IN) Inhale 1 puff into the lungs daily.   [DISCONTINUED] blood glucose meter kit and supplies KIT Just check first thing in the morning to ensure no low blood sugar.   [DISCONTINUED] gabapentin (NEURONTIN) 250 MG/5ML solution Take 4 mLs (200 mg total) by mouth every 8 (eight) hours.   fluticasone (FLONASE) 50 MCG/ACT nasal spray Place 2 sprays into both nostrils daily. (Patient not taking: Reported on 11/19/2021)   guaiFENesin-dextromethorphan (ROBITUSSIN DM) 100-10 MG/5ML syrup Take 5 mLs by mouth every 4 (four) hours as needed for cough. (Patient not taking: Reported on 11/19/2021)   hydroxypropyl methylcellulose (ISOPTO TEARS) 2.5 % ophthalmic solution Place 1 drop into both eyes 3 (three) times daily as needed. Dry eyes    magic mouthwash w/lidocaine SOLN Take 10 mLs by  mouth 3 (three) times daily as needed for mouth pain. (Patient not taking: Reported on 11/19/2021)   metoprolol tartrate (LOPRESSOR) 25 MG tablet Take 0.5 tablets (12.5 mg total) by mouth 2 (two) times daily. (Patient not taking: Reported on 11/19/2021)   mupirocin ointment (BACTROBAN) 2 % Apply topically 2 (two) times daily. (Patient not taking: Reported on 11/19/2021)   oxybutynin (DITROPAN) 5 MG tablet Take 1 tablet (5 mg total) by mouth every 8 (eight) hours as needed for bladder spasms. (Patient not taking: Reported on  11/19/2021)   No facility-administered medications prior to visit.    Review of Systems  Last hemoglobin A1c Lab Results  Component Value Date   HGBA1C 7.7 (H) 07/21/2021       Objective    BP (!) 99/56 (BP Location: Right Arm, Patient Position: Sitting, Cuff Size: Normal)  BP Readings from Last 3 Encounters:  11/19/21 (!) 99/56  10/19/21 138/69  10/08/21 (!) 107/57   Wt Readings from Last 3 Encounters:  10/19/21 148 lb 2.4 oz (67.2 kg)  10/08/21 156 lb (70.8 kg)  09/08/21 152 lb 8 oz (69.2 kg)      Physical Exam  ***  No results found for any visits on 11/19/21.  Assessment & Plan     ***  Return in about 3 months (around 02/19/2022).      {provider attestation***:1}   [name], MD  [facility] [phone] (phone) [phone] (fax)  [place]

## 2021-11-20 ENCOUNTER — Other Ambulatory Visit: Payer: Self-pay | Admitting: Family Medicine

## 2021-11-20 DIAGNOSIS — J302 Other seasonal allergic rhinitis: Secondary | ICD-10-CM

## 2021-11-20 LAB — RENAL FUNCTION PANEL
Albumin: 4 g/dL (ref 3.5–4.6)
BUN/Creatinine Ratio: 12 (ref 12–28)
BUN: 28 mg/dL (ref 10–36)
CO2: 26 mmol/L (ref 20–29)
Calcium: 9.1 mg/dL (ref 8.7–10.3)
Chloride: 99 mmol/L (ref 96–106)
Creatinine, Ser: 2.33 mg/dL — ABNORMAL HIGH (ref 0.57–1.00)
Glucose: 161 mg/dL — ABNORMAL HIGH (ref 70–99)
Phosphorus: 4.6 mg/dL — ABNORMAL HIGH (ref 3.0–4.3)
Potassium: 5.2 mmol/L (ref 3.5–5.2)
Sodium: 141 mmol/L (ref 134–144)
eGFR: 19 mL/min/{1.73_m2} — ABNORMAL LOW (ref >59)

## 2021-11-20 LAB — CBC WITH DIFFERENTIAL/PLATELET
Basophils Absolute: 0.1 10*3/uL (ref 0.0–0.2)
Basos: 1 %
EOS (ABSOLUTE): 0.2 10*3/uL (ref 0.0–0.4)
Eos: 2 %
Hematocrit: 28.5 % — ABNORMAL LOW (ref 34.0–46.6)
Hemoglobin: 10 g/dL — ABNORMAL LOW (ref 11.1–15.9)
Immature Grans (Abs): 0.1 10*3/uL (ref 0.0–0.1)
Immature Granulocytes: 1 %
Lymphocytes Absolute: 3.5 10*3/uL — ABNORMAL HIGH (ref 0.7–3.1)
Lymphs: 43 %
MCH: 40.8 pg — ABNORMAL HIGH (ref 26.6–33.0)
MCHC: 35.1 g/dL (ref 31.5–35.7)
MCV: 116 fL — ABNORMAL HIGH (ref 79–97)
Monocytes Absolute: 1 10*3/uL — ABNORMAL HIGH (ref 0.1–0.9)
Monocytes: 12 %
Neutrophils Absolute: 3.4 10*3/uL (ref 1.4–7.0)
Neutrophils: 41 %
Platelets: 244 10*3/uL (ref 150–450)
RBC: 2.45 x10E6/uL — CL (ref 3.77–5.28)
RDW: 17.1 % — ABNORMAL HIGH (ref 11.7–15.4)
WBC: 8.2 10*3/uL (ref 3.4–10.8)

## 2021-11-20 LAB — HEMOGLOBIN A1C
Est. average glucose Bld gHb Est-mCnc: 169 mg/dL
Hgb A1c MFr Bld: 7.5 % — ABNORMAL HIGH (ref 4.8–5.6)

## 2021-11-20 MED ORDER — FLUTICASONE PROPIONATE 50 MCG/ACT NA SUSP: 2.0000 | Freq: Every day | NASAL | 3 refills | Status: AC

## 2021-11-20 NOTE — Telephone Encounter (Signed)
Requested Prescriptions  Pending Prescriptions Disp Refills  . furosemide (LASIX) 20 MG tablet 30 tablet     Sig: Take 1 tablet (20 mg total) by mouth 2 (two) times daily.     Cardiovascular:  Diuretics - Loop Failed - 11/20/2021 10:28 AM      Failed - Cr in normal range and within 180 days    Creatinine  Date Value Ref Range Status  02/06/2014 1.12 0.60 - 1.30 mg/dL Final   Creatinine, Ser  Date Value Ref Range Status  11/19/2021 2.33 (H) 0.57 - 1.00 mg/dL Final         Passed - K in normal range and within 180 days    Potassium  Date Value Ref Range Status  11/19/2021 5.2 3.5 - 5.2 mmol/L Final  02/06/2014 3.9 3.5 - 5.1 mmol/L Final         Passed - Ca in normal range and within 180 days    Calcium  Date Value Ref Range Status  11/19/2021 9.1 8.7 - 10.3 mg/dL Final   Calcium, Total (PTH)  Date Value Ref Range Status  10/13/2021 8.6 (L) 8.7 - 10.3 mg/dL Final         Passed - Na in normal range and within 180 days    Sodium  Date Value Ref Range Status  11/19/2021 141 134 - 144 mmol/L Final  02/06/2014 139 136 - 145 mmol/L Final         Passed - Cl in normal range and within 180 days    Chloride  Date Value Ref Range Status  11/19/2021 99 96 - 106 mmol/L Final  02/06/2014 103 98 - 107 mmol/L Final         Passed - Mg Level in normal range and within 180 days    Magnesium  Date Value Ref Range Status  07/24/2021 1.7 1.7 - 2.4 mg/dL Final    Comment:    Performed at [facility], [address] [place], [place] [address]         Passed - Last BP in normal range    BP Readings from Last 1 Encounters:  11/19/21 (!) 99/56         Passed - Valid encounter within last 6 months    Recent Outpatient Visits          Yesterday Type 2 diabetes mellitus with diabetic polyneuropathy, without long-term current use of insulin [facility])   [facility] Maple Hudson., MD   3 months ago Chronic systolic CHF (congestive heart failure), NYHA  class 3 (HCC)   [facility] Alfredia Ferguson, PA-C   7 months ago Gross hematuria   [facility] Ok Edwards, [place], PA-C   10 months ago Type 2 diabetes mellitus with diabetic polyneuropathy, without long-term current use of insulin [facility])   [facility] Maple Hudson., MD   1 year ago Type 2 diabetes mellitus with diabetic polyneuropathy, without long-term current use of insulin [facility])   [facility] Maple Hudson., MD      Future Appointments            In 3 months Maple Hudson., MD [facility]           . fluticasone (FLONASE) 50 MCG/ACT nasal spray 16 g 3    Sig: Place 2 sprays into both nostrils daily.     Ear, Nose, and Throat: Nasal Preparations - Corticosteroids Passed - 11/20/2021  10:28 AM      Passed - Valid encounter within last 12 months    Recent Outpatient Visits          Yesterday Type 2 diabetes mellitus with diabetic polyneuropathy, without long-term current use of insulin [facility])   [facility] Maple Hudson., MD   3 months ago Chronic systolic CHF (congestive heart failure), NYHA class 3 [facility])   [facility] Alfredia Ferguson, PA-C   7 months ago Gross hematuria   [facility] Ok Edwards, [place], PA-C   10 months ago Type 2 diabetes mellitus with diabetic polyneuropathy, without long-term current use of insulin [facility])   [facility] Maple Hudson., MD   1 year ago Type 2 diabetes mellitus with diabetic polyneuropathy, without long-term current use of insulin [facility])   [facility] Hudson., MD      Future Appointments            In 3 months Maple Hudson., MD [facility], [facility]           . tiotropium (SPIRIVA HANDIHALER) 18 MCG inhalation capsule      Sig: Place into inhaler and inhale daily.     Pulmonology:  Anticholinergic Agents Passed -  11/20/2021 10:28 AM      Passed - Valid encounter within last 12 months    Recent Outpatient Visits          Yesterday Type 2 diabetes mellitus with diabetic polyneuropathy, without long-term current use of insulin [facility])   [facility] Maple Hudson., MD   3 months ago Chronic systolic CHF (congestive heart failure), NYHA class 3 (HCC)   [facility] Alfredia Ferguson, PA-C   7 months ago Gross hematuria   [facility] Ok Edwards, [place], PA-C   10 months ago Type 2 diabetes mellitus with diabetic polyneuropathy, without long-term current use of insulin [facility])   [facility] Maple Hudson., MD   1 year ago Type 2 diabetes mellitus with diabetic polyneuropathy, without long-term current use of insulin [facility])   [facility] Maple Hudson., MD      Future Appointments            In 3 months Maple Hudson., MD [facility], [facility]

## 2021-11-20 NOTE — Telephone Encounter (Signed)
Medication Refill - Medication: furosemide (LASIX) 20 MG tablet [[id number]]  Dosage changed in skilled nursing   Fluticasone-Salmeterol (ADVAIR DISKUS IN) [[id number]] Prescribed in Skilled Nursing  Tiotropium Bromide Monohydrate (SPIRIVA HANDIHALER IN) [[id number]] Prescribed in skilled Nursing       Has the patient contacted their pharmacy? Yes.     Preferred Pharmacy (with phone number or street name):  [facility] #[id number] Nicholes Rough, [place] - [address] AT [facility] OF [address]  [address] [place] [address]  Phone: [phone] Fax: [phone]  Hours: Not open 24 hours      Has the patient been seen for an appointment in the last year OR does the patient have an upcoming appointment? Yes.  11/19/21  Agent: Please be advised that RX refills may take up to 3 business days. We ask that you follow-up with your pharmacy.

## 2021-11-20 NOTE — Telephone Encounter (Signed)
Please advise Spiriva inhaler? Has not been filled by provider before.

## 2021-11-20 NOTE — Telephone Encounter (Addendum)
Duplicate. Prior request sent to provider with questions.

## 2021-11-20 NOTE — Telephone Encounter (Signed)
Requested medication (s) are due for refill today:   Requested medication (s) are on the active medication list: Yes  Last refill:  Lasix - 10/19/21  Amery   Spiriva - 11/19/21 Historical provider  Future visit scheduled: Yes  Notes to clinic:  See requests.    Requested Prescriptions  Pending Prescriptions Disp Refills   furosemide (LASIX) 20 MG tablet 30 tablet     Sig: Take 1 tablet (20 mg total) by mouth 2 (two) times daily.     Cardiovascular:  Diuretics - Loop Failed - 11/20/2021 10:28 AM      Failed - Cr in normal range and within 180 days    Creatinine  Date Value Ref Range Status  02/06/2014 1.12 0.60 - 1.30 mg/dL Final   Creatinine, Ser  Date Value Ref Range Status  11/19/2021 2.33 (H) 0.57 - 1.00 mg/dL Final         Passed - K in normal range and within 180 days    Potassium  Date Value Ref Range Status  11/19/2021 5.2 3.5 - 5.2 mmol/L Final  02/06/2014 3.9 3.5 - 5.1 mmol/L Final         Passed - Ca in normal range and within 180 days    Calcium  Date Value Ref Range Status  11/19/2021 9.1 8.7 - 10.3 mg/dL Final   Calcium, Total (PTH)  Date Value Ref Range Status  10/13/2021 8.6 (L) 8.7 - 10.3 mg/dL Final         Passed - Na in normal range and within 180 days    Sodium  Date Value Ref Range Status  11/19/2021 141 134 - 144 mmol/L Final  02/06/2014 139 136 - 145 mmol/L Final         Passed - Cl in normal range and within 180 days    Chloride  Date Value Ref Range Status  11/19/2021 99 96 - 106 mmol/L Final  02/06/2014 103 98 - 107 mmol/L Final         Passed - Mg Level in normal range and within 180 days    Magnesium  Date Value Ref Range Status  07/24/2021 1.7 1.7 - 2.4 mg/dL Final    Comment:    Performed at [facility], [address] [place], [place] [address]         Passed - Last BP in normal range    BP Readings from Last 1 Encounters:  11/19/21 (!) 99/56         Passed - Valid encounter within last 6 months     Recent Outpatient Visits           Yesterday Type 2 diabetes mellitus with diabetic polyneuropathy, without long-term current use of insulin [facility])   [facility] Maple Hudson., MD   3 months ago Chronic systolic CHF (congestive heart failure), NYHA class 3 (HCC)   [facility] Alfredia Ferguson, PA-C   7 months ago Gross hematuria   [facility] Ok Edwards, [place], PA-C   10 months ago Type 2 diabetes mellitus with diabetic polyneuropathy, without long-term current use of insulin [facility])   [facility] Maple Hudson., MD   1 year ago Type 2 diabetes mellitus with diabetic polyneuropathy, without long-term current use of insulin [facility])   [facility] Maple Hudson., MD       Future Appointments             In 3 months Sullivan Lone,  Leonette Monarch., MD [facility], [facility]             tiotropium [facility] HANDIHALER) 18 MCG inhalation capsule      Sig: Place into inhaler and inhale daily.     Pulmonology:  Anticholinergic Agents Passed - 11/20/2021 10:28 AM      Passed - Valid encounter within last 12 months    Recent Outpatient Visits           Yesterday Type 2 diabetes mellitus with diabetic polyneuropathy, without long-term current use of insulin [facility])   [facility] Maple Hudson., MD   3 months ago Chronic systolic CHF (congestive heart failure), NYHA class 3 (HCC)   [facility] Alfredia Ferguson, PA-C   7 months ago Gross hematuria   [facility] Ok Edwards, [place], PA-C   10 months ago Type 2 diabetes mellitus with diabetic polyneuropathy, without long-term current use of insulin [facility])   [facility] Maple Hudson., MD   1 year ago Type 2 diabetes mellitus with diabetic polyneuropathy, without long-term current use of insulin [facility])   [facility] Maple Hudson., MD       Future  Appointments             In 3 months Maple Hudson., MD [facility], [facility]            Signed Prescriptions Disp Refills   fluticasone (FLONASE) 50 MCG/ACT nasal spray 16 g 3    Sig: Place 2 sprays into both nostrils daily.     Ear, Nose, and Throat: Nasal Preparations - Corticosteroids Passed - 11/20/2021 10:28 AM      Passed - Valid encounter within last 12 months    Recent Outpatient Visits           Yesterday Type 2 diabetes mellitus with diabetic polyneuropathy, without long-term current use of insulin [facility])   [facility] Maple Hudson., MD   3 months ago Chronic systolic CHF (congestive heart failure), NYHA class 3 (HCC)   [facility] Alfredia Ferguson, PA-C   7 months ago Gross hematuria   [facility] Ok Edwards, [place], PA-C   10 months ago Type 2 diabetes mellitus with diabetic polyneuropathy, without long-term current use of insulin [facility])   [facility] Maple Hudson., MD   1 year ago Type 2 diabetes mellitus with diabetic polyneuropathy, without long-term current use of insulin [facility])   [facility] Maple Hudson., MD       Future Appointments             In 3 months Maple Hudson., MD [facility], [facility]

## 2021-11-20 NOTE — Telephone Encounter (Signed)
Okay to fill? Message in epic stating patient was to stop taking this medication.

## 2021-11-24 ENCOUNTER — Other Ambulatory Visit: Payer: Self-pay | Admitting: Family Medicine

## 2021-11-24 DIAGNOSIS — G20 Parkinson's disease: Secondary | ICD-10-CM | POA: Diagnosis not present

## 2021-11-24 DIAGNOSIS — I959 Hypotension, unspecified: Secondary | ICD-10-CM | POA: Diagnosis not present

## 2021-11-24 DIAGNOSIS — N1832 Chronic kidney disease, stage 3b: Secondary | ICD-10-CM | POA: Diagnosis not present

## 2021-11-24 DIAGNOSIS — G608 Other hereditary and idiopathic neuropathies: Secondary | ICD-10-CM | POA: Diagnosis not present

## 2021-11-24 DIAGNOSIS — G4752 REM sleep behavior disorder: Secondary | ICD-10-CM | POA: Diagnosis not present

## 2021-11-24 DIAGNOSIS — R42 Dizziness and giddiness: Secondary | ICD-10-CM | POA: Diagnosis not present

## 2021-11-24 DIAGNOSIS — H538 Other visual disturbances: Secondary | ICD-10-CM | POA: Diagnosis not present

## 2021-11-24 DIAGNOSIS — G4733 Obstructive sleep apnea (adult) (pediatric): Secondary | ICD-10-CM | POA: Diagnosis not present

## 2021-11-24 DIAGNOSIS — G2581 Restless legs syndrome: Secondary | ICD-10-CM | POA: Diagnosis not present

## 2021-11-24 MED ORDER — SPIRIVA HANDIHALER 18 MCG IN CAPS: 18.0000 ug | ORAL_CAPSULE | Freq: Every day | RESPIRATORY_TRACT | 1 refills | Status: AC

## 2021-11-24 NOTE — Telephone Encounter (Signed)
Please advise refill? 

## 2021-11-24 NOTE — Telephone Encounter (Signed)
Requested medication (s) are due for refill today: -  Requested medication (s) are on the active medication list: historical med  Last refill:  09/08/21  Future visit scheduled: yes  Notes to clinic:  historical med and provider   Requested Prescriptions  Pending Prescriptions Disp Refills   empagliflozin (JARDIANCE) 10 MG TABS tablet 30 tablet     Sig: Take by mouth daily.     Endocrinology:  Diabetes - SGLT2 Inhibitors Failed - 11/24/2021  9:18 AM      Failed - Cr in normal range and within 360 days    Creatinine  Date Value Ref Range Status  02/06/2014 1.12 0.60 - 1.30 mg/dL Final   Creatinine, Ser  Date Value Ref Range Status  11/19/2021 2.33 (H) 0.57 - 1.00 mg/dL Final         Failed - eGFR in normal range and within 360 days    EGFR (African American)  Date Value Ref Range Status  02/06/2014 52 (L)  Final   GFR calc Af Amer  Date Value Ref Range Status  01/17/2020 44 (L) >59 mL/min/1.73 Final    Comment:    **[facility] currently reports eGFR in compliance with the current**   recommendations of the [employer]. [facility] will   update reporting as new guidelines are published from the NKF-ASN   Task force.    EGFR (Non-African Amer.)  Date Value Ref Range Status  02/06/2014 44 (L)  Final    Comment:    eGFR values <65m/min/1.73 m2 may be an indication of chronic kidney disease (CKD). Calculated eGFR is useful in patients with stable renal function. The eGFR calculation will not be reliable in acutely ill patients when serum creatinine is changing rapidly. It is not useful in  patients on dialysis. The eGFR calculation may not be applicable to patients at the low and high extremes of body sizes, pregnant women, and vegetarians.    GFR, Estimated  Date Value Ref Range Status  10/19/2021 27 (L) >60 mL/min Final    Comment:    (NOTE) Calculated using the CKD-EPI Creatinine Equation (2021)    eGFR  Date Value Ref Range Status  11/19/2021 19  (L) >59 mL/min/1.73 Final         Passed - HBA1C is between 0 and 7.9 and within 180 days    Hgb A1c MFr Bld  Date Value Ref Range Status  11/19/2021 7.5 (H) 4.8 - 5.6 % Final    Comment:             Prediabetes: 5.7 - 6.4          Diabetes: >6.4          Glycemic control for adults with diabetes: <7.0          Passed - Valid encounter within last 6 months    Recent Outpatient Visits           5 days ago Type 2 diabetes mellitus with diabetic polyneuropathy, without long-term current use of insulin ([facility]   [facility]   3 months ago Chronic systolic CHF (congestive heart failure), NYHA class 3 ([facility]   [facility]Thedore Mins [place] PA-C   7 months ago Gross hematuria   [facility] Mins [place] PA-C   10 months ago Type 2 diabetes mellitus with diabetic polyneuropathy, without long-term current use of insulin ([facility]   [facility]   1 year  ago Type 2 diabetes mellitus with diabetic polyneuropathy, without long-term current use of insulin [facility])   [facility] Jerrol Banana., MD       Future Appointments             In 3 months Jerrol Banana., MD [facility], [facility]

## 2021-11-24 NOTE — Telephone Encounter (Signed)
Pt caretaker Chanda Busing is calling back requesting an update.   Please advise.

## 2021-11-24 NOTE — Telephone Encounter (Signed)
Medication Refill - Medication: empagliflozin (JARDIANCE) 25 MG TABS tablet   Prescribed in skilled Nursing.   Has the patient contacted their pharmacy? Yes.   No, refills.   (Agent: If no, request that the patient contact the pharmacy for the refill. If patient does not wish to contact the pharmacy document the reason why and proceed with request.)   Preferred Pharmacy (with phone number or street name):  [facility] #[id number] Nicholes Rough, [place] - [address] AT [facility] OF [address]  [address] [place] [address]  Phone: [phone] Fax: [phone]  Hours: Not open 24 hours   Has the patient been seen for an appointment in the last year OR does the patient have an upcoming appointment? Yes.    Agent: Please be advised that RX refills may take up to 3 business days. We ask that you follow-up with your pharmacy.

## 2021-11-26 DIAGNOSIS — E1140 Type 2 diabetes mellitus with diabetic neuropathy, unspecified: Secondary | ICD-10-CM | POA: Diagnosis not present

## 2021-11-26 DIAGNOSIS — B351 Tinea unguium: Secondary | ICD-10-CM | POA: Diagnosis not present

## 2021-11-27 MED ORDER — JARDIANCE 25 MG PO TABS: 25.0000 mg | ORAL_TABLET | Freq: Every day | ORAL | 3 refills | Status: AC

## 2021-11-27 MED ORDER — FLUTICASONE-SALMETEROL 250-50 MCG/ACT IN AEPB: 1.0000 | INHALATION_SPRAY | Freq: Two times a day (BID) | RESPIRATORY_TRACT | 5 refills | Status: AC

## 2021-11-27 MED ORDER — FUROSEMIDE 20 MG PO TABS
20.0000 mg | ORAL_TABLET | Freq: Two times a day (BID) | ORAL | Status: AC
Start: 2021-11-27 — End: ?

## 2021-11-27 NOTE — Telephone Encounter (Signed)
Returned call

## 2021-12-01 DIAGNOSIS — M6281 Muscle weakness (generalized): Secondary | ICD-10-CM | POA: Diagnosis not present

## 2021-12-01 DIAGNOSIS — I10 Essential (primary) hypertension: Secondary | ICD-10-CM | POA: Diagnosis not present

## 2021-12-01 DIAGNOSIS — R279 Unspecified lack of coordination: Secondary | ICD-10-CM | POA: Diagnosis not present

## 2021-12-01 DIAGNOSIS — G20 Parkinson's disease: Secondary | ICD-10-CM | POA: Diagnosis not present

## 2021-12-01 DIAGNOSIS — R2681 Unsteadiness on feet: Secondary | ICD-10-CM | POA: Diagnosis not present

## 2021-12-01 DIAGNOSIS — I447 Left bundle-branch block, unspecified: Secondary | ICD-10-CM | POA: Diagnosis not present

## 2021-12-01 DIAGNOSIS — I5023 Acute on chronic systolic (congestive) heart failure: Secondary | ICD-10-CM | POA: Diagnosis not present

## 2021-12-01 DIAGNOSIS — N1832 Chronic kidney disease, stage 3b: Secondary | ICD-10-CM | POA: Diagnosis not present

## 2021-12-01 DIAGNOSIS — R262 Difficulty in walking, not elsewhere classified: Secondary | ICD-10-CM | POA: Diagnosis not present

## 2021-12-03 ENCOUNTER — Ambulatory Visit: Payer: Self-pay

## 2021-12-03 DIAGNOSIS — G20 Parkinson's disease: Secondary | ICD-10-CM | POA: Diagnosis not present

## 2021-12-03 DIAGNOSIS — R279 Unspecified lack of coordination: Secondary | ICD-10-CM | POA: Diagnosis not present

## 2021-12-03 DIAGNOSIS — I5023 Acute on chronic systolic (congestive) heart failure: Secondary | ICD-10-CM | POA: Diagnosis not present

## 2021-12-03 DIAGNOSIS — R262 Difficulty in walking, not elsewhere classified: Secondary | ICD-10-CM | POA: Diagnosis not present

## 2021-12-03 DIAGNOSIS — M6281 Muscle weakness (generalized): Secondary | ICD-10-CM | POA: Diagnosis not present

## 2021-12-03 DIAGNOSIS — R2681 Unsteadiness on feet: Secondary | ICD-10-CM | POA: Diagnosis not present

## 2021-12-03 NOTE — Telephone Encounter (Signed)
  Chief Complaint: Cloudy urine - off smell Symptoms: ibid Frequency: this morning Pertinent Negatives: Patient denies pain Disposition: [] ED /[] Urgent Care (no appt availability in office) / [] Appointment(In office/virtual)/ []  [facility]/ [] Home Care/ [x] Refused Recommended Disposition /[]  Mobile Bus/ []  Follow-up with PCP Additional Notes: Returned Josette's phone call. Josette states that pt seems fine with no complaints. Josette noticed that pt's urine is a bit cloudy and smells off. Josette was hoping that an antibiotic could be prescribed for pt. Josette will bring pt in or drop off urine sample if desired.    Summary: Possible UTI returning, antibiotic request   Josette, Caregiver, called to report that the patient has cloudy urine with an odor. Josette is asking for another antibiotic round for her since she just had a UTI.  [facility]ORE  , [place] - [address] AT [facility] OF [address]  [address] [place] [address] Nicholes Rough  Phone: [phone] Fax: [phone]   Best contact: [phone]      Reason for Disposition  Bad or foul-smelling urine  Answer Assessment - Initial Assessment Questions 1. SYMPTOM: "What's the main symptom you're concerned about?" (e.g., frequency, incontinence)     Cloudy urine, off smell 2. ONSET: "When did the  UTI  start?"     today 3. PAIN: "Is there any pain?" If Yes, ask: "How bad is it?" (Scale: 1-10; mild, moderate, severe)     no 4. CAUSE: "What do you think is causing the symptoms?"     UTI 5. OTHER SYMPTOMS: "Do you have any other symptoms?" (e.g., fever, flank pain, blood in urine, pain with urination)     no 6. PREGNANCY: "Is there any chance you are pregnant?" "When was your last menstrual period?"     na  Protocols used: Urinary Symptoms-A-AH

## 2021-12-04 NOTE — Telephone Encounter (Signed)
Patient's caregiver returned call. I advised her of Dr. Wonda Olds message. Caregiver is going to take patient to an urgent care to make sure she doesn't have a UTI.

## 2021-12-05 DIAGNOSIS — R399 Unspecified symptoms and signs involving the genitourinary system: Secondary | ICD-10-CM | POA: Diagnosis not present

## 2021-12-05 DIAGNOSIS — R8290 Unspecified abnormal findings in urine: Secondary | ICD-10-CM | POA: Diagnosis not present

## 2021-12-08 DIAGNOSIS — R262 Difficulty in walking, not elsewhere classified: Secondary | ICD-10-CM | POA: Diagnosis not present

## 2021-12-08 DIAGNOSIS — M6281 Muscle weakness (generalized): Secondary | ICD-10-CM | POA: Diagnosis not present

## 2021-12-08 DIAGNOSIS — R2681 Unsteadiness on feet: Secondary | ICD-10-CM | POA: Diagnosis not present

## 2021-12-08 DIAGNOSIS — R279 Unspecified lack of coordination: Secondary | ICD-10-CM | POA: Diagnosis not present

## 2021-12-08 DIAGNOSIS — I5023 Acute on chronic systolic (congestive) heart failure: Secondary | ICD-10-CM | POA: Diagnosis not present

## 2021-12-08 DIAGNOSIS — G20 Parkinson's disease: Secondary | ICD-10-CM | POA: Diagnosis not present

## 2021-12-10 DIAGNOSIS — R262 Difficulty in walking, not elsewhere classified: Secondary | ICD-10-CM | POA: Diagnosis not present

## 2021-12-10 DIAGNOSIS — I5023 Acute on chronic systolic (congestive) heart failure: Secondary | ICD-10-CM | POA: Diagnosis not present

## 2021-12-10 DIAGNOSIS — M6281 Muscle weakness (generalized): Secondary | ICD-10-CM | POA: Diagnosis not present

## 2021-12-10 DIAGNOSIS — R2681 Unsteadiness on feet: Secondary | ICD-10-CM | POA: Diagnosis not present

## 2021-12-10 DIAGNOSIS — R279 Unspecified lack of coordination: Secondary | ICD-10-CM | POA: Diagnosis not present

## 2021-12-10 DIAGNOSIS — G20 Parkinson's disease: Secondary | ICD-10-CM | POA: Diagnosis not present

## 2021-12-15 ENCOUNTER — Other Ambulatory Visit: Payer: Self-pay | Admitting: Family Medicine

## 2021-12-15 DIAGNOSIS — N390 Urinary tract infection, site not specified: Secondary | ICD-10-CM | POA: Diagnosis not present

## 2021-12-15 DIAGNOSIS — R319 Hematuria, unspecified: Secondary | ICD-10-CM | POA: Diagnosis not present

## 2021-12-15 DIAGNOSIS — G629 Polyneuropathy, unspecified: Secondary | ICD-10-CM

## 2021-12-15 DIAGNOSIS — R300 Dysuria: Secondary | ICD-10-CM | POA: Diagnosis not present

## 2021-12-15 NOTE — Telephone Encounter (Unsigned)
Copied from CRM [id number]. Topic: General - Other >> Dec 15, 2021 11:44 AM Everette C wrote: Reason for CRM: Medication Refill - Medication: DULoxetine (CYMBALTA) 30 MG capsule [[id number]]   Has the patient contacted their pharmacy? Yes.  The patient's caregiver was directed to contact their PCP (Agent: If no, request that the patient contact the pharmacy for the refill. If patient does not wish to contact the pharmacy document the reason why and proceed with request.) (Agent: If yes, when and what did the pharmacy advise?)  Preferred Pharmacy (with phone number or street name): [facility] #[id number] Nicholes Rough, [place] - [address] AT [facility] OF [address] [place] [address] Phone: [phone] Fax: [phone] Hours: Not open 24 hours   Has the patient been seen for an appointment in the last year OR does the patient have an upcoming appointment? Yes.    Agent: Please be advised that RX refills may take up to 3 business days. We ask that you follow-up with your pharmacy.

## 2021-12-16 DIAGNOSIS — R262 Difficulty in walking, not elsewhere classified: Secondary | ICD-10-CM | POA: Diagnosis not present

## 2021-12-16 DIAGNOSIS — R2681 Unsteadiness on feet: Secondary | ICD-10-CM | POA: Diagnosis not present

## 2021-12-16 DIAGNOSIS — R279 Unspecified lack of coordination: Secondary | ICD-10-CM | POA: Diagnosis not present

## 2021-12-16 DIAGNOSIS — G20 Parkinson's disease: Secondary | ICD-10-CM | POA: Diagnosis not present

## 2021-12-16 DIAGNOSIS — I5023 Acute on chronic systolic (congestive) heart failure: Secondary | ICD-10-CM | POA: Diagnosis not present

## 2021-12-16 DIAGNOSIS — M6281 Muscle weakness (generalized): Secondary | ICD-10-CM | POA: Diagnosis not present

## 2021-12-16 MED ORDER — DULOXETINE HCL 30 MG PO CPEP: ORAL_CAPSULE | ORAL | 2 refills | Status: AC

## 2021-12-16 NOTE — Telephone Encounter (Signed)
Requested Prescriptions  Pending Prescriptions Disp Refills  . DULoxetine (CYMBALTA) 30 MG capsule 30 capsule 2    Sig: TAKE 1 CAPSULE(30 MG) BY MOUTH DAILY     Psychiatry: Antidepressants - SNRI - duloxetine Failed - 12/15/2021  2:58 PM      Failed - Cr in normal range and within 360 days    Creatinine  Date Value Ref Range Status  02/06/2014 1.12 0.60 - 1.30 mg/dL Final   Creatinine, Ser  Date Value Ref Range Status  11/19/2021 2.33 (H) 0.57 - 1.00 mg/dL Final         Failed - eGFR is 30 or above and within 360 days    EGFR (African American)  Date Value Ref Range Status  02/06/2014 52 (L)  Final   GFR calc Af Amer  Date Value Ref Range Status  01/17/2020 44 (L) >59 mL/min/1.73 Final    Comment:    **[facility] currently reports eGFR in compliance with the current**   recommendations of the [employer]. [facility] will   update reporting as new guidelines are published from the NKF-ASN   Task force.    EGFR (Non-African Amer.)  Date Value Ref Range Status  02/06/2014 44 (L)  Final    Comment:    eGFR values <83m/min/1.73 m2 may be an indication of chronic kidney disease (CKD). Calculated eGFR is useful in patients with stable renal function. The eGFR calculation will not be reliable in acutely ill patients when serum creatinine is changing rapidly. It is not useful in  patients on dialysis. The eGFR calculation may not be applicable to patients at the low and high extremes of body sizes, pregnant women, and vegetarians.    GFR, Estimated  Date Value Ref Range Status  10/19/2021 27 (L) >60 mL/min Final    Comment:    (NOTE) Calculated using the CKD-EPI Creatinine Equation (2021)    eGFR  Date Value Ref Range Status  11/19/2021 19 (L) >59 mL/min/1.73 Final         Passed - Completed PHQ-2 or PHQ-9 in the last 360 days      Passed - Last BP in normal range    BP Readings from Last 1 Encounters:  11/19/21 (!) 99/56         Passed - Valid  encounter within last 6 months    Recent Outpatient Visits          3 weeks ago Type 2 diabetes mellitus with diabetic polyneuropathy, without long-term current use of insulin ([facility]   [facility]   4 months ago Chronic systolic CHF (congestive heart failure), NYHA class 3 ([facility]   [facility] Mins L[place] PA-C   7 months ago Gross hematuria   [facility] Mins [place] PA-C   11 months ago Type 2 diabetes mellitus with diabetic polyneuropathy, without long-term current use of insulin ([facility]   [facility] Ban[facility], MD   1 year ago Type 2 diabetes mellitus with diabetic polyneuropathy, without long-term current use of insulin ([facility]   [facility]GJerrol Banana, MD      Future Appointments            In 2 months GJerrol Banana, MD [facility]

## 2021-12-17 DIAGNOSIS — G20 Parkinson's disease: Secondary | ICD-10-CM | POA: Diagnosis not present

## 2021-12-17 DIAGNOSIS — M6281 Muscle weakness (generalized): Secondary | ICD-10-CM | POA: Diagnosis not present

## 2021-12-17 DIAGNOSIS — R262 Difficulty in walking, not elsewhere classified: Secondary | ICD-10-CM | POA: Diagnosis not present

## 2021-12-17 DIAGNOSIS — R2681 Unsteadiness on feet: Secondary | ICD-10-CM | POA: Diagnosis not present

## 2021-12-17 DIAGNOSIS — I5023 Acute on chronic systolic (congestive) heart failure: Secondary | ICD-10-CM | POA: Diagnosis not present

## 2021-12-17 DIAGNOSIS — R279 Unspecified lack of coordination: Secondary | ICD-10-CM | POA: Diagnosis not present

## 2021-12-22 DIAGNOSIS — I5023 Acute on chronic systolic (congestive) heart failure: Secondary | ICD-10-CM | POA: Diagnosis not present

## 2021-12-22 DIAGNOSIS — G20 Parkinson's disease: Secondary | ICD-10-CM | POA: Diagnosis not present

## 2021-12-22 DIAGNOSIS — M6281 Muscle weakness (generalized): Secondary | ICD-10-CM | POA: Diagnosis not present

## 2021-12-22 DIAGNOSIS — R279 Unspecified lack of coordination: Secondary | ICD-10-CM | POA: Diagnosis not present

## 2021-12-22 DIAGNOSIS — R262 Difficulty in walking, not elsewhere classified: Secondary | ICD-10-CM | POA: Diagnosis not present

## 2021-12-22 DIAGNOSIS — R2681 Unsteadiness on feet: Secondary | ICD-10-CM | POA: Diagnosis not present

## 2021-12-24 DIAGNOSIS — G20 Parkinson's disease: Secondary | ICD-10-CM | POA: Diagnosis not present

## 2021-12-24 DIAGNOSIS — I5023 Acute on chronic systolic (congestive) heart failure: Secondary | ICD-10-CM | POA: Diagnosis not present

## 2021-12-24 DIAGNOSIS — R279 Unspecified lack of coordination: Secondary | ICD-10-CM | POA: Diagnosis not present

## 2021-12-24 DIAGNOSIS — R2681 Unsteadiness on feet: Secondary | ICD-10-CM | POA: Diagnosis not present

## 2021-12-24 DIAGNOSIS — R262 Difficulty in walking, not elsewhere classified: Secondary | ICD-10-CM | POA: Diagnosis not present

## 2021-12-24 DIAGNOSIS — M6281 Muscle weakness (generalized): Secondary | ICD-10-CM | POA: Diagnosis not present

## 2021-12-28 NOTE — Progress Notes (Unsigned)
I,Sha'taria Tyson,acting as a Education administrator for [employer], PA-C.,have documented all relevant documentation on the behalf of Renee Kirschner, PA-C,as directed by  Renee Kirschner, PA-C while in the presence of Renee Kirschner, PA-C.   Established patient visit   Patient: Renee Carpenter   DOB: [birth year]   88 y.o. Female  MRN: [id number] Visit Date: 12/29/2021  Today's healthcare provider: [name], PA-C   Cc. Skin finding, eye pain  Subjective    HPI   Pt reports a new blister/growth on her R arm and a smaller one on her right hand. Reports they are itchy, denies pain, discharge.   She also reports right eye intermittent pain, a 'gritty feeling'. Has appt w/ opthalmology in September. Denies changes in vision, discharge.   Medications: Outpatient Medications Prior to Visit  Medication Sig   Accu-Chek FastClix Lancets MISC Just check first thing in the morning to ensure no low blood sugar.   albuterol (VENTOLIN HFA) 108 (90 Base) MCG/ACT inhaler INHALE 2 PUFFS INTO THE LUNGS EVERY 4 HOURS AS NEEDED FOR WHEEZING OR SHORTNESS OF BREATH   Blood Glucose Monitoring Suppl (ACCU-CHEK GUIDE) w/Device KIT Check blood sugars daily as directed   carbidopa-levodopa (SINEMET IR) 25-100 MG tablet Take 1 tablet by mouth 3 (three) times daily. Take 1 tablet three times a day   Coenzyme Q10 (CO Q10) 200 MG CAPS Take 1 capsule by mouth daily.   DULoxetine (CYMBALTA) 30 MG capsule TAKE 1 CAPSULE(30 MG) BY MOUTH DAILY   esomeprazole (NEXIUM) 40 MG capsule TAKE 1 CAPSULE BY MOUTH EVERY DAY   fluticasone (FLONASE) 50 MCG/ACT nasal spray Place 2 sprays into both nostrils daily.   fluticasone-salmeterol (ADVAIR DISKUS) 250-50 MCG/ACT AEPB Inhale 1 puff into the lungs 2 (two) times daily.   furosemide (LASIX) 20 MG tablet Take 1 tablet (20 mg total) by mouth 2 (two) times daily.   gabapentin (NEURONTIN) 400 MG capsule Take 1 capsule (400 mg total) by mouth 3 (three) times daily.   glimepiride  (AMARYL) 2 MG tablet Take 1 tablet (2 mg total) by mouth daily with breakfast.   glucose blood (ACCU-CHEK GUIDE) test strip Just check first thing in the morning to ensure no low blood sugar.   guaiFENesin-dextromethorphan (ROBITUSSIN DM) 100-10 MG/5ML syrup Take 5 mLs by mouth every 4 (four) hours as needed for cough.   JARDIANCE 25 MG TABS tablet Take 1 tablet (25 mg total) by mouth daily.   Lancets Misc. (UNISTIK 3 COMFORT) MISC [place] 3 Comfort Lancets, 25 g, 1.88 mm   magic mouthwash w/lidocaine SOLN Take 10 mLs by mouth 3 (three) times daily as needed for mouth pain.   MAGNESIUM-OXIDE 400 (241.3 Mg) MG tablet TAKE 1 TABLET BY MOUTH TWICE DAILY   metoprolol tartrate (LOPRESSOR) 25 MG tablet Take 0.5 tablets (12.5 mg total) by mouth 2 (two) times daily.   MULTIPLE VITAMIN PO Take 1 tablet by mouth daily.   mupirocin ointment (BACTROBAN) 2 % Apply topically 2 (two) times daily.   oxybutynin (DITROPAN) 5 MG tablet Take 1 tablet (5 mg total) by mouth every 8 (eight) hours as needed for bladder spasms.   polyethylene glycol (MIRALAX / GLYCOLAX) 17 g packet Take 17 g by mouth daily.   Probiotic Product (ALIGN PO) Take 1 capsule by mouth daily.   Propylene Glycol (SYSTANE BALANCE OP) Apply 1 drop to eye 3 (three) times daily as needed. Both eyes   rOPINIRole (REQUIP) 0.5 MG tablet Take 0.5 mg by mouth 3 (  three) times daily.   senna-docusate (SENOKOT-S) 8.6-50 MG tablet Take 1 tablet by mouth at bedtime.   tiotropium (SPIRIVA HANDIHALER) 18 MCG inhalation capsule Place 1 capsule (18 mcg total) into inhaler and inhale daily.   [DISCONTINUED] hydroxypropyl methylcellulose (ISOPTO TEARS) 2.5 % ophthalmic solution Place 1 drop into both eyes 3 (three) times daily as needed. Dry eyes    No facility-administered medications prior to visit.    Review of Systems  Constitutional:  Negative for fatigue and fever.  Eyes:  Positive for pain. Negative for discharge, redness and itching.  Respiratory:   Negative for cough and shortness of breath.   Cardiovascular:  Negative for chest pain and leg swelling.  Gastrointestinal:  Negative for abdominal pain.  Neurological:  Negative for dizziness and headaches.       Objective    Blood pressure (!) 113/58, pulse 87, weight 147 lb 4.8 oz (66.8 kg), SpO2 98 %.   Physical Exam Vitals reviewed.  Constitutional:      Appearance: She is not ill-appearing.  HENT:     Head: Normocephalic.  Eyes:     General: Lids are normal. Vision grossly intact.        Right eye: No foreign body, discharge or hordeolum.     Conjunctiva/sclera: Conjunctivae normal.     Right eye: Right conjunctiva is not injected. No hemorrhage. Cardiovascular:     Rate and Rhythm: Normal rate.  Pulmonary:     Effort: Pulmonary effort is normal. No respiratory distress.  Skin:    Comments: Hypopigmented scaled lesion R forearm w/ surrounding erythema, 1-2 cm. 2-3 mm similar lesion on middle right knuckle  Neurological:     General: No focal deficit present.     Mental Status: She is alert and oriented to person, place, and time.  Psychiatric:        Mood and Affect: Mood normal.        Behavior: Behavior normal.      No results found for any visits on 12/29/21.  Assessment & Plan     Problem List Items Addressed This Visit       Musculoskeletal and Integument   Skin lesion - Primary    Appears like a actinic keratosis rx triamcinolone to use topically.  Will monitor for now      Relevant Medications   triamcinolone cream (KENALOG) 0.1 %     Other   Dry eye    Artificial tears rx previously; refilled rx.  Pt had some left over at home and feels improvement.  Advised if sensation continues to move up f/u with optho      Relevant Medications   hydroxypropyl methylcellulose / hypromellose (ISOPTO TEARS / GONIOVISC) 2.5 % ophthalmic solution     Return if symptoms worsen or fail to improve.      I, Renee Kirschner, PA-C have reviewed all  documentation for this visit. The documentation on  12/29/2021 for the exam, diagnosis, procedures, and orders are all accurate and complete.Renee Kirschner, PA-C [facility] [address] [place], [place], [address] Office: [phone] Fax: [place]

## 2021-12-29 ENCOUNTER — Encounter: Payer: Self-pay | Admitting: Physician Assistant

## 2021-12-29 ENCOUNTER — Ambulatory Visit (INDEPENDENT_AMBULATORY_CARE_PROVIDER_SITE_OTHER): Payer: Medicare PPO | Admitting: Physician Assistant

## 2021-12-29 VITALS — BP 113/58 | HR 87 | Wt 147.3 lb

## 2021-12-29 DIAGNOSIS — L989 Disorder of the skin and subcutaneous tissue, unspecified: Secondary | ICD-10-CM | POA: Diagnosis not present

## 2021-12-29 DIAGNOSIS — H04129 Dry eye syndrome of unspecified lacrimal gland: Secondary | ICD-10-CM | POA: Diagnosis not present

## 2021-12-29 MED ORDER — TRIAMCINOLONE ACETONIDE 0.1 % EX CREA: 1.0000 | TOPICAL_CREAM | Freq: Two times a day (BID) | CUTANEOUS | 0 refills | Status: DC

## 2021-12-29 MED ORDER — HYPROMELLOSE (GONIOSCOPIC) 2.5 % OP SOLN: 1.0000 [drp] | Freq: Three times a day (TID) | OPHTHALMIC | 0 refills | Status: AC | PRN

## 2021-12-29 NOTE — Assessment & Plan Note (Signed)
Appears like a actinic keratosis rx triamcinolone to use topically.  Will monitor for now

## 2021-12-29 NOTE — Assessment & Plan Note (Signed)
Artificial tears rx previously; refilled rx.  Pt had some left over at home and feels improvement.  Advised if sensation continues to move up f/u with optho

## 2021-12-31 DIAGNOSIS — G20 Parkinson's disease: Secondary | ICD-10-CM | POA: Diagnosis not present

## 2021-12-31 DIAGNOSIS — M6281 Muscle weakness (generalized): Secondary | ICD-10-CM | POA: Diagnosis not present

## 2021-12-31 DIAGNOSIS — R262 Difficulty in walking, not elsewhere classified: Secondary | ICD-10-CM | POA: Diagnosis not present

## 2021-12-31 DIAGNOSIS — I5023 Acute on chronic systolic (congestive) heart failure: Secondary | ICD-10-CM | POA: Diagnosis not present

## 2021-12-31 DIAGNOSIS — R2681 Unsteadiness on feet: Secondary | ICD-10-CM | POA: Diagnosis not present

## 2021-12-31 DIAGNOSIS — R279 Unspecified lack of coordination: Secondary | ICD-10-CM | POA: Diagnosis not present

## 2022-01-05 DIAGNOSIS — M6281 Muscle weakness (generalized): Secondary | ICD-10-CM | POA: Diagnosis not present

## 2022-01-05 DIAGNOSIS — I5023 Acute on chronic systolic (congestive) heart failure: Secondary | ICD-10-CM | POA: Diagnosis not present

## 2022-01-05 DIAGNOSIS — R262 Difficulty in walking, not elsewhere classified: Secondary | ICD-10-CM | POA: Diagnosis not present

## 2022-01-05 DIAGNOSIS — G20 Parkinson's disease: Secondary | ICD-10-CM | POA: Diagnosis not present

## 2022-01-05 DIAGNOSIS — R279 Unspecified lack of coordination: Secondary | ICD-10-CM | POA: Diagnosis not present

## 2022-01-05 DIAGNOSIS — R2681 Unsteadiness on feet: Secondary | ICD-10-CM | POA: Diagnosis not present

## 2022-01-07 DIAGNOSIS — I959 Hypotension, unspecified: Secondary | ICD-10-CM | POA: Diagnosis not present

## 2022-01-07 DIAGNOSIS — G608 Other hereditary and idiopathic neuropathies: Secondary | ICD-10-CM | POA: Diagnosis not present

## 2022-01-07 DIAGNOSIS — G4752 REM sleep behavior disorder: Secondary | ICD-10-CM | POA: Diagnosis not present

## 2022-01-07 DIAGNOSIS — E569 Vitamin deficiency, unspecified: Secondary | ICD-10-CM | POA: Diagnosis not present

## 2022-01-07 DIAGNOSIS — G2581 Restless legs syndrome: Secondary | ICD-10-CM | POA: Diagnosis not present

## 2022-01-07 DIAGNOSIS — R432 Parageusia: Secondary | ICD-10-CM | POA: Diagnosis not present

## 2022-01-07 DIAGNOSIS — G20 Parkinson's disease: Secondary | ICD-10-CM | POA: Diagnosis not present

## 2022-01-08 DIAGNOSIS — R2681 Unsteadiness on feet: Secondary | ICD-10-CM | POA: Diagnosis not present

## 2022-01-08 DIAGNOSIS — R262 Difficulty in walking, not elsewhere classified: Secondary | ICD-10-CM | POA: Diagnosis not present

## 2022-01-08 DIAGNOSIS — R279 Unspecified lack of coordination: Secondary | ICD-10-CM | POA: Diagnosis not present

## 2022-01-08 DIAGNOSIS — G20 Parkinson's disease: Secondary | ICD-10-CM | POA: Diagnosis not present

## 2022-01-08 DIAGNOSIS — I5023 Acute on chronic systolic (congestive) heart failure: Secondary | ICD-10-CM | POA: Diagnosis not present

## 2022-01-08 DIAGNOSIS — M6281 Muscle weakness (generalized): Secondary | ICD-10-CM | POA: Diagnosis not present

## 2022-01-11 DIAGNOSIS — C44529 Squamous cell carcinoma of skin of other part of trunk: Secondary | ICD-10-CM | POA: Diagnosis not present

## 2022-01-11 DIAGNOSIS — D045 Carcinoma in situ of skin of trunk: Secondary | ICD-10-CM | POA: Diagnosis not present

## 2022-01-12 DIAGNOSIS — G20 Parkinson's disease: Secondary | ICD-10-CM | POA: Diagnosis not present

## 2022-01-12 DIAGNOSIS — R279 Unspecified lack of coordination: Secondary | ICD-10-CM | POA: Diagnosis not present

## 2022-01-12 DIAGNOSIS — I5023 Acute on chronic systolic (congestive) heart failure: Secondary | ICD-10-CM | POA: Diagnosis not present

## 2022-01-12 DIAGNOSIS — R262 Difficulty in walking, not elsewhere classified: Secondary | ICD-10-CM | POA: Diagnosis not present

## 2022-01-12 DIAGNOSIS — R2681 Unsteadiness on feet: Secondary | ICD-10-CM | POA: Diagnosis not present

## 2022-01-12 DIAGNOSIS — M6281 Muscle weakness (generalized): Secondary | ICD-10-CM | POA: Diagnosis not present

## 2022-01-14 DIAGNOSIS — I5023 Acute on chronic systolic (congestive) heart failure: Secondary | ICD-10-CM | POA: Diagnosis not present

## 2022-01-14 DIAGNOSIS — R262 Difficulty in walking, not elsewhere classified: Secondary | ICD-10-CM | POA: Diagnosis not present

## 2022-01-14 DIAGNOSIS — M6281 Muscle weakness (generalized): Secondary | ICD-10-CM | POA: Diagnosis not present

## 2022-01-14 DIAGNOSIS — R2681 Unsteadiness on feet: Secondary | ICD-10-CM | POA: Diagnosis not present

## 2022-01-14 DIAGNOSIS — G20 Parkinson's disease: Secondary | ICD-10-CM | POA: Diagnosis not present

## 2022-01-14 DIAGNOSIS — R279 Unspecified lack of coordination: Secondary | ICD-10-CM | POA: Diagnosis not present

## 2022-01-15 DIAGNOSIS — H353132 Nonexudative age-related macular degeneration, bilateral, intermediate dry stage: Secondary | ICD-10-CM | POA: Diagnosis not present

## 2022-01-15 DIAGNOSIS — H524 Presbyopia: Secondary | ICD-10-CM | POA: Diagnosis not present

## 2022-01-15 LAB — HM DIABETES EYE EXAM

## 2022-01-18 ENCOUNTER — Ambulatory Visit: Payer: Medicare PPO | Admitting: Family

## 2022-01-19 DIAGNOSIS — I5023 Acute on chronic systolic (congestive) heart failure: Secondary | ICD-10-CM | POA: Diagnosis not present

## 2022-01-19 DIAGNOSIS — R262 Difficulty in walking, not elsewhere classified: Secondary | ICD-10-CM | POA: Diagnosis not present

## 2022-01-19 DIAGNOSIS — M6281 Muscle weakness (generalized): Secondary | ICD-10-CM | POA: Diagnosis not present

## 2022-01-19 DIAGNOSIS — R279 Unspecified lack of coordination: Secondary | ICD-10-CM | POA: Diagnosis not present

## 2022-01-19 DIAGNOSIS — R2681 Unsteadiness on feet: Secondary | ICD-10-CM | POA: Diagnosis not present

## 2022-01-19 DIAGNOSIS — G20 Parkinson's disease: Secondary | ICD-10-CM | POA: Diagnosis not present

## 2022-01-21 DIAGNOSIS — M6281 Muscle weakness (generalized): Secondary | ICD-10-CM | POA: Diagnosis not present

## 2022-01-21 DIAGNOSIS — G20 Parkinson's disease: Secondary | ICD-10-CM | POA: Diagnosis not present

## 2022-01-21 DIAGNOSIS — R279 Unspecified lack of coordination: Secondary | ICD-10-CM | POA: Diagnosis not present

## 2022-01-21 DIAGNOSIS — I5023 Acute on chronic systolic (congestive) heart failure: Secondary | ICD-10-CM | POA: Diagnosis not present

## 2022-01-21 DIAGNOSIS — R2681 Unsteadiness on feet: Secondary | ICD-10-CM | POA: Diagnosis not present

## 2022-01-21 DIAGNOSIS — R262 Difficulty in walking, not elsewhere classified: Secondary | ICD-10-CM | POA: Diagnosis not present

## 2022-01-25 ENCOUNTER — Other Ambulatory Visit: Payer: Self-pay | Admitting: *Deleted

## 2022-01-25 DIAGNOSIS — E1142 Type 2 diabetes mellitus with diabetic polyneuropathy: Secondary | ICD-10-CM

## 2022-01-25 DIAGNOSIS — G20 Parkinson's disease: Secondary | ICD-10-CM

## 2022-01-26 DIAGNOSIS — G20 Parkinson's disease: Secondary | ICD-10-CM | POA: Diagnosis not present

## 2022-01-26 DIAGNOSIS — R279 Unspecified lack of coordination: Secondary | ICD-10-CM | POA: Diagnosis not present

## 2022-01-26 DIAGNOSIS — R2681 Unsteadiness on feet: Secondary | ICD-10-CM | POA: Diagnosis not present

## 2022-01-26 DIAGNOSIS — I5023 Acute on chronic systolic (congestive) heart failure: Secondary | ICD-10-CM | POA: Diagnosis not present

## 2022-01-26 DIAGNOSIS — R262 Difficulty in walking, not elsewhere classified: Secondary | ICD-10-CM | POA: Diagnosis not present

## 2022-01-26 DIAGNOSIS — M6281 Muscle weakness (generalized): Secondary | ICD-10-CM | POA: Diagnosis not present

## 2022-01-28 ENCOUNTER — Encounter: Payer: Self-pay | Admitting: Family Medicine

## 2022-01-28 ENCOUNTER — Ambulatory Visit: Payer: Self-pay

## 2022-01-28 ENCOUNTER — Ambulatory Visit (INDEPENDENT_AMBULATORY_CARE_PROVIDER_SITE_OTHER): Payer: Medicare PPO | Admitting: Family Medicine

## 2022-01-28 VITALS — BP 101/55 | HR 86 | Resp 16

## 2022-01-28 DIAGNOSIS — G629 Polyneuropathy, unspecified: Secondary | ICD-10-CM

## 2022-01-28 DIAGNOSIS — K2100 Gastro-esophageal reflux disease with esophagitis, without bleeding: Secondary | ICD-10-CM | POA: Diagnosis not present

## 2022-01-28 DIAGNOSIS — M21372 Foot drop, left foot: Secondary | ICD-10-CM

## 2022-01-28 DIAGNOSIS — K5909 Other constipation: Secondary | ICD-10-CM

## 2022-01-28 DIAGNOSIS — M792 Neuralgia and neuritis, unspecified: Secondary | ICD-10-CM

## 2022-01-28 DIAGNOSIS — I890 Lymphedema, not elsewhere classified: Secondary | ICD-10-CM

## 2022-01-28 DIAGNOSIS — K5790 Diverticulosis of intestine, part unspecified, without perforation or abscess without bleeding: Secondary | ICD-10-CM

## 2022-01-28 DIAGNOSIS — I10 Essential (primary) hypertension: Secondary | ICD-10-CM

## 2022-01-28 DIAGNOSIS — M545 Low back pain, unspecified: Secondary | ICD-10-CM

## 2022-01-28 DIAGNOSIS — R262 Difficulty in walking, not elsewhere classified: Secondary | ICD-10-CM | POA: Diagnosis not present

## 2022-01-28 DIAGNOSIS — E1142 Type 2 diabetes mellitus with diabetic polyneuropathy: Secondary | ICD-10-CM

## 2022-01-28 DIAGNOSIS — N1832 Chronic kidney disease, stage 3b: Secondary | ICD-10-CM

## 2022-01-28 DIAGNOSIS — G8929 Other chronic pain: Secondary | ICD-10-CM

## 2022-01-28 DIAGNOSIS — F411 Generalized anxiety disorder: Secondary | ICD-10-CM | POA: Diagnosis not present

## 2022-01-28 DIAGNOSIS — R279 Unspecified lack of coordination: Secondary | ICD-10-CM | POA: Diagnosis not present

## 2022-01-28 DIAGNOSIS — D649 Anemia, unspecified: Secondary | ICD-10-CM

## 2022-01-28 DIAGNOSIS — M6281 Muscle weakness (generalized): Secondary | ICD-10-CM | POA: Diagnosis not present

## 2022-01-28 DIAGNOSIS — I5023 Acute on chronic systolic (congestive) heart failure: Secondary | ICD-10-CM | POA: Diagnosis not present

## 2022-01-28 DIAGNOSIS — G20 Parkinson's disease: Secondary | ICD-10-CM | POA: Diagnosis not present

## 2022-01-28 DIAGNOSIS — R2681 Unsteadiness on feet: Secondary | ICD-10-CM | POA: Diagnosis not present

## 2022-01-28 NOTE — Patient Instructions (Signed)
TRY OVER-THE-COUNTER PERICOLACE DAILY.

## 2022-01-28 NOTE — Telephone Encounter (Signed)
  Chief Complaint: abdominal pain Symptoms: R sided abdominal pain, intermittent, 6-7/10, hard stools and dry mouth Frequency: ongoing for a while but getting worse Pertinent Negatives: NA Disposition: [] ED /[] Urgent Care (no appt availability in office) / [x] Appointment(In office/virtual)/ []  [facility]/ [] Home Care/ [] Refused Recommended Disposition /[] [place] [facility]/ []  Follow-up with PCP Additional Notes: caregiver Josette called to report pt's symptoms with pt present. Was able to schedule pt appt for today at 1400. Caregiver ensured they would be able to make it but advised to CB as soon as can if unable to make it on time. She verbalized understanding.   Reason for Disposition  [1] MODERATE pain (e.g., interferes with normal activities) AND [2] pain comes and goes (cramps) AND [3] present > 24 hours  (Exception: Pain with Vomiting or Diarrhea - see that Guideline.)  Answer Assessment - Initial Assessment Questions 1. LOCATION: "Where does it hurt?"      R sided pain  3. ONSET: "When did the pain begin?" (e.g., minutes, hours or days ago)      For a while, getting more intense  5. PATTERN "Does the pain come and go, or is it constant?"    - If it comes and goes: "How long does it last?" "Do you have pain now?"     (Note: Comes and goes means the pain is intermittent. It goes away completely between bouts.)    - If constant: "Is it getting better, staying the same, or getting worse?"      (Note: Constant means the pain never goes away completely; most serious pain is constant and gets worse.)      Comes and goes  6. SEVERITY: "How bad is the pain?"  (e.g., Scale 1-10; mild, moderate, or severe)    - MILD (1-3): Doesn't interfere with normal activities, abdomen soft and not tender to touch.     - MODERATE (4-7): Interferes with normal activities or awakens from sleep, abdomen tender to touch.     - SEVERE (8-10): Excruciating pain, doubled over, unable to do any  normal activities.       6-7/10 10. OTHER SYMPTOMS: "Do you have any other symptoms?" (e.g., back pain, diarrhea, fever, urination pain, vomiting)       Dry mouth and stools are hard  Protocols used: Abdominal Pain - Female-A-AH

## 2022-01-28 NOTE — Progress Notes (Signed)
Established patient visit  I,[name],acting as a scribe for [name], MD.,have documented all relevant documentation on the behalf of [name], MD,as directed by  [name], MD while in the presence of [name], MD.   Patient: Renee Carpenter   DOB: 04/26/1926   88 y.o. Female  MRN: [id number] Visit Date: 01/28/2022  Today's healthcare provider: [name], MD   Chief Complaint  Patient presents with   Abdominal Pain   Subjective    Abdominal Pain Pertinent negatives include no fever, nausea or vomiting.    She has chronic constipation and hard stools and states she feels bloated because of this.  Fever chills weight loss or melena hematochezia. Patient states she has had right flank pain and abdominal pain for a while. Patient states symptoms have gotten worse lately. No urine symptoms. Patient states it feels like gas.  She also complains of chronic low back pain which she has had for many years.  Recent falls. Medications: Outpatient Medications Prior to Visit  Medication Sig   Accu-Chek FastClix Lancets MISC Just check first thing in the morning to ensure no low blood sugar.   albuterol (VENTOLIN HFA) 108 (90 Base) MCG/ACT inhaler INHALE 2 PUFFS INTO THE LUNGS EVERY 4 HOURS AS NEEDED FOR WHEEZING OR SHORTNESS OF BREATH   Blood Glucose Monitoring Suppl (ACCU-CHEK GUIDE) w/Device KIT Check blood sugars daily as directed   carbidopa-levodopa (SINEMET IR) 25-100 MG tablet Take 1 tablet by mouth 3 (three) times daily. Take 1 tablet three times a day   Coenzyme Q10 (CO Q10) 200 MG CAPS Take 1 capsule by mouth daily.   DULoxetine (CYMBALTA) 30 MG capsule TAKE 1 CAPSULE(30 MG) BY MOUTH DAILY   esomeprazole (NEXIUM) 40 MG capsule TAKE 1 CAPSULE BY MOUTH EVERY DAY   fluticasone (FLONASE) 50 MCG/ACT nasal spray Place 2 sprays into both nostrils daily.   fluticasone-salmeterol (ADVAIR DISKUS) 250-50 MCG/ACT AEPB Inhale 1 puff into  the lungs 2 (two) times daily.   furosemide (LASIX) 20 MG tablet Take 1 tablet (20 mg total) by mouth 2 (two) times daily.   gabapentin (NEURONTIN) 400 MG capsule Take 1 capsule (400 mg total) by mouth 3 (three) times daily.   glucose blood (ACCU-CHEK GUIDE) test strip Just check first thing in the morning to ensure no low blood sugar.   hydroxypropyl methylcellulose / hypromellose (ISOPTO TEARS / GONIOVISC) 2.5 % ophthalmic solution Place 1 drop into both eyes 3 (three) times daily as needed. Dry eyes   JARDIANCE 25 MG TABS tablet Take 1 tablet (25 mg total) by mouth daily.   Lancets Misc. (UNISTIK 3 COMFORT) MISC [place]ik 3 Comfort Lancets, 25 g, 1.88 mm   MAGNESIUM-OXIDE 400 (241.3 Mg) MG tablet TAKE 1 TABLET BY MOUTH TWICE DAILY   MULTIPLE VITAMIN PO Take 1 tablet by mouth daily.   polyethylene glycol (MIRALAX / GLYCOLAX) 17 g packet Take 17 g by mouth daily.   Probiotic Product (ALIGN PO) Take 1 capsule by mouth daily.   Propylene Glycol (SYSTANE BALANCE OP) Apply 1 drop to eye 3 (three) times daily as needed. Both eyes   rOPINIRole (REQUIP) 0.5 MG tablet Take 0.5 mg by mouth 3 (three) times daily.   senna-docusate (SENOKOT-S) 8.6-50 MG tablet Take 1 tablet by mouth at bedtime.   tiotropium (SPIRIVA HANDIHALER) 18 MCG inhalation capsule Place 1 capsule (18 mcg total) into inhaler and inhale daily.   metoprolol tartrate (LOPRESSOR) 25 MG  tablet Take 0.5 tablets (12.5 mg total) by mouth 2 (two) times daily.   [DISCONTINUED] glimepiride (AMARYL) 2 MG tablet Take 1 tablet (2 mg total) by mouth daily with breakfast. (Patient not taking: Reported on 01/28/2022)   [DISCONTINUED] guaiFENesin-dextromethorphan (ROBITUSSIN DM) 100-10 MG/5ML syrup Take 5 mLs by mouth every 4 (four) hours as needed for cough. (Patient not taking: Reported on 01/28/2022)   [DISCONTINUED] magic mouthwash w/lidocaine SOLN Take 10 mLs by mouth 3 (three) times daily as needed for mouth pain.   [DISCONTINUED] mupirocin ointment  (BACTROBAN) 2 % Apply topically 2 (two) times daily. (Patient not taking: Reported on 01/28/2022)   [DISCONTINUED] oxybutynin (DITROPAN) 5 MG tablet Take 1 tablet (5 mg total) by mouth every 8 (eight) hours as needed for bladder spasms. (Patient not taking: Reported on 01/28/2022)   [DISCONTINUED] triamcinolone cream (KENALOG) 0.1 % Apply 1 Application topically 2 (two) times daily. (Patient not taking: Reported on 01/28/2022)   No facility-administered medications prior to visit.    Review of Systems  Constitutional:  Negative for appetite change, chills, fatigue and fever.  Respiratory:  Negative for chest tightness and shortness of breath.   Cardiovascular:  Negative for chest pain and palpitations.  Gastrointestinal:  Positive for abdominal pain. Negative for nausea and vomiting.  Neurological:  Negative for dizziness and weakness.    Last hemoglobin A1c Lab Results  Component Value Date   HGBA1C 7.5 (H) 11/19/2021       Objective    BP (!) 101/55 (BP Location: Left Arm, Patient Position: Sitting, Cuff Size: Normal)   Pulse 86   Resp 16   SpO2 96%  BP Readings from Last 3 Encounters:  01/28/22 (!) 101/55  12/29/21 (!) 113/58  11/19/21 (!) 99/56   Wt Readings from Last 3 Encounters:  12/29/21 147 lb 4.8 oz (66.8 kg)  10/19/21 148 lb 2.4 oz (67.2 kg)  10/08/21 156 lb (70.8 kg)      Physical Exam Vitals reviewed.  Constitutional:      Comments: Elderly white female sitting in a wheelchair.  She is very alert and oriented.  HENT:     Head: Normocephalic and atraumatic.     Right Ear: Tympanic membrane, ear canal and external ear normal.     Left Ear: Tympanic membrane, ear canal and external ear normal.  Eyes:     General: No scleral icterus.    Conjunctiva/sclera: Conjunctivae normal.  Neck:     Vascular: No carotid bruit.  Cardiovascular:     Rate and Rhythm: Normal rate and regular rhythm.     Heart sounds: Normal heart sounds.  Pulmonary:     Breath sounds:  Normal breath sounds.  Abdominal:     Palpations: Abdomen is soft. There is no mass.     Comments: Normal abdominal exam.  Musculoskeletal:     Cervical back: No muscular tenderness.     Right lower leg: Edema present.     Left lower leg: Edema present.     Comments: Signifcant thoracic kyphosis. Trace LE edema.Lymphedema.  Lymphadenopathy:     Cervical: No cervical adenopathy.  Skin:    General: Skin is warm and dry.  Neurological:     General: No focal deficit present.     Mental Status: She is alert and oriented to person, place, and time.  Psychiatric:        Mood and Affect: Mood normal.        Behavior: Behavior normal.  Thought Content: Thought content normal.        Judgment: Judgment normal.       No results found for any visits on 01/28/22.  Assessment & Plan     1. Chronic constipation Try Peri-Colace daily.  2. Chronic midline low back pain, unspecified whether sciatica present Patient has had multiple spinal surgeries. Foot drop  3. Neuropathy Also on duloxetine  4. Type 2 diabetes mellitus with diabetic polyneuropathy, without long-term current use of insulin ([place]) On Jardiance  5. Lymphedema   6. Neuropathic pain On chronic gabapentin  7. Anxiety, generalized Stable  8. Essential hypertension Watch for hypotension  9. Gastroesophageal reflux disease with esophagitis without hemorrhage   10. Diverticulosis   11. Stage 3b chronic kidney disease ([place]) Jardiance  12. Chronic anemia Anemia of chronic disease  13. Left foot drop    No follow-ups on file.      I, [name], MD, have reviewed all documentation for this visit. The documentation on 01/31/22 for the exam, diagnosis, procedures, and orders are all accurate and complete.    Renee Carpenter Mon, MD  [facility] [phone] (phone) [phone] (fax)  [place]

## 2022-01-29 ENCOUNTER — Telehealth: Payer: Self-pay

## 2022-01-29 NOTE — Chronic Care Management (AMB) (Signed)
  Chronic Care Management   Outreach Note  01/29/2022 Name: [name] MRN: [id number] DOB: 14-May-1926  [name] is a 88 y.o. year old female who is a primary care patient of Maple Hudson., MD. I reached out to Phebe Colla by phone today in response to a referral sent by Ms. Durene Fruits Monestime's primary care provider.  An unsuccessful telephone outreach was attempted today. The patient was referred to the case management team for assistance with care management and care coordination.   Follow Up Plan: A HIPAA compliant phone message was left for the patient providing contact information and requesting a return call.  The care management team will reach out to the patient again over the next 7 days.  If patient returns call to provider office, please advise to call Embedded Care Management Care Guide Penne Lash * at [phone]*  Penne Lash, RMA Care Guide [facility]  [place], [place] [address] Direct Dial: [phone] [email or web address]

## 2022-02-01 ENCOUNTER — Encounter: Payer: Self-pay | Admitting: *Deleted

## 2022-02-01 DIAGNOSIS — R700 Elevated erythrocyte sedimentation rate: Secondary | ICD-10-CM | POA: Diagnosis not present

## 2022-02-02 DIAGNOSIS — R279 Unspecified lack of coordination: Secondary | ICD-10-CM | POA: Diagnosis not present

## 2022-02-02 DIAGNOSIS — I5023 Acute on chronic systolic (congestive) heart failure: Secondary | ICD-10-CM | POA: Diagnosis not present

## 2022-02-02 DIAGNOSIS — R2681 Unsteadiness on feet: Secondary | ICD-10-CM | POA: Diagnosis not present

## 2022-02-02 DIAGNOSIS — G20 Parkinson's disease: Secondary | ICD-10-CM | POA: Diagnosis not present

## 2022-02-02 DIAGNOSIS — M6281 Muscle weakness (generalized): Secondary | ICD-10-CM | POA: Diagnosis not present

## 2022-02-02 DIAGNOSIS — R262 Difficulty in walking, not elsewhere classified: Secondary | ICD-10-CM | POA: Diagnosis not present

## 2022-02-03 ENCOUNTER — Ambulatory Visit: Payer: Self-pay

## 2022-02-03 NOTE — Telephone Encounter (Signed)
  Chief Complaint: chest pain, abdominal pain Symptoms: pt had R sided chest heaviness last night last for several mins, abdominal discomfort, bloating, gas, difficulty passing bowels Frequency: chest pain last night, abdominal discomfort ongoing for several day  Pertinent Negatives: Patient denies chest pain currently  Disposition: [] ED /[] Urgent Care (no appt availability in office) / [x] Appointment(In office/virtual)/ []  [facility]/ [] Home Care/ [] Refused Recommended Disposition /[] [facility]/ []  Follow-up with PCP Additional Notes: pt states she ended up having to get out of bed last night to get the pain to go away. Completely gone today. Pt c/o increased gas and discomfort, has taken Gas-X but hasn't helped any. Last BM was Monday. Advised pt to schedule appt and scheduled for 02/04/22 at 1500. Encouraged pt if chest heaviness comes back to go to ED to be seen. She verbalized understanding.   Reason for Disposition  [1] Chest pain lasts > 5 minutes AND [2] occurred > 3 days ago (72 hours) AND [3] NO chest pain or cardiac symptoms now  Answer Assessment - Initial Assessment Questions 1. LOCATION: "Where does it hurt?"       R chest heavy pain 3. ONSET: "When did the chest pain begin?" (Minutes, hours or days)      Yesterday night  4. PATTERN: "Does the pain come and go, or has it been constant since it started?"  "Does it get worse with exertion?"      Comes and goes 5. DURATION: "How long does it last" (e.g., seconds, minutes, hours)     Lasted for a while last night until she got up and stood  6. SEVERITY: "How bad is the pain?"  (e.g., Scale 1-10; mild, moderate, or severe)    - MILD (1-3): doesn't interfere with normal activities     - MODERATE (4-7): interferes with normal activities or awakens from sleep    - SEVERE (8-10): excruciating pain, unable to do any normal activities       8 last night  7. CARDIAC RISK FACTORS: "Do you have any history of heart  problems or risk factors for heart disease?" (e.g., angina, prior heart attack; diabetes, high blood pressure, high cholesterol, smoker, or strong family history of heart disease)     CHF 10. OTHER SYMPTOMS: "Do you have any other symptoms?" (e.g., dizziness, nausea, vomiting, sweating, fever, difficulty breathing, cough)       Increased gas  Protocols used: Chest Pain-A-AH

## 2022-02-04 ENCOUNTER — Encounter: Payer: Self-pay | Admitting: Family Medicine

## 2022-02-04 ENCOUNTER — Ambulatory Visit: Payer: Medicare PPO | Admitting: Family Medicine

## 2022-02-04 VITALS — BP 110/58 | HR 81 | Resp 16

## 2022-02-04 DIAGNOSIS — R2681 Unsteadiness on feet: Secondary | ICD-10-CM | POA: Diagnosis not present

## 2022-02-04 DIAGNOSIS — I10 Essential (primary) hypertension: Secondary | ICD-10-CM | POA: Diagnosis not present

## 2022-02-04 DIAGNOSIS — K219 Gastro-esophageal reflux disease without esophagitis: Secondary | ICD-10-CM | POA: Diagnosis not present

## 2022-02-04 DIAGNOSIS — F411 Generalized anxiety disorder: Secondary | ICD-10-CM

## 2022-02-04 DIAGNOSIS — N1832 Chronic kidney disease, stage 3b: Secondary | ICD-10-CM | POA: Diagnosis not present

## 2022-02-04 DIAGNOSIS — R279 Unspecified lack of coordination: Secondary | ICD-10-CM | POA: Diagnosis not present

## 2022-02-04 DIAGNOSIS — I5023 Acute on chronic systolic (congestive) heart failure: Secondary | ICD-10-CM | POA: Diagnosis not present

## 2022-02-04 DIAGNOSIS — K2100 Gastro-esophageal reflux disease with esophagitis, without bleeding: Secondary | ICD-10-CM | POA: Diagnosis not present

## 2022-02-04 DIAGNOSIS — R262 Difficulty in walking, not elsewhere classified: Secondary | ICD-10-CM | POA: Diagnosis not present

## 2022-02-04 DIAGNOSIS — M6281 Muscle weakness (generalized): Secondary | ICD-10-CM | POA: Diagnosis not present

## 2022-02-04 DIAGNOSIS — R079 Chest pain, unspecified: Secondary | ICD-10-CM

## 2022-02-04 DIAGNOSIS — G20 Parkinson's disease: Secondary | ICD-10-CM | POA: Diagnosis not present

## 2022-02-04 NOTE — Progress Notes (Signed)
Established patient visit  I,[name],acting as a scribe for [name], MD.,have documented all relevant documentation on the behalf of [name], MD,as directed by  [name], MD while in the presence of [name], MD.   Patient: Renee Carpenter   DOB: 04-03-1926   88 y.o. Female  MRN: [id number] Visit Date: 02/04/2022  Today's healthcare provider: [name], MD   No chief complaint on file.  Subjective    HPI    Patient states she had a hard pain that went from her abdomien to her chest while she was in bed.  Patient woke up with abdominal pain/epigastric pain which radiated to her chest.  As soon as she got up out of the bed and sat up or stood up and belched the pain completely resolved.  No associated symptoms.  No diaphoresis no shortness of breath no syncope or presyncope.  Medications: Outpatient Medications Prior to Visit  Medication Sig   Accu-Chek FastClix Lancets MISC Just check first thing in the morning to ensure no low blood sugar.   albuterol (VENTOLIN HFA) 108 (90 Base) MCG/ACT inhaler INHALE 2 PUFFS INTO THE LUNGS EVERY 4 HOURS AS NEEDED FOR WHEEZING OR SHORTNESS OF BREATH   Blood Glucose Monitoring Suppl (ACCU-CHEK GUIDE) w/Device KIT Check blood sugars daily as directed   carbidopa-levodopa (SINEMET IR) 25-100 MG tablet Take 1 tablet by mouth 3 (three) times daily. Take 1 tablet three times a day   Coenzyme Q10 (CO Q10) 200 MG CAPS Take 1 capsule by mouth daily.   DULoxetine (CYMBALTA) 30 MG capsule TAKE 1 CAPSULE(30 MG) BY MOUTH DAILY   esomeprazole (NEXIUM) 40 MG capsule TAKE 1 CAPSULE BY MOUTH EVERY DAY   fluticasone (FLONASE) 50 MCG/ACT nasal spray Place 2 sprays into both nostrils daily.   fluticasone-salmeterol (ADVAIR DISKUS) 250-50 MCG/ACT AEPB Inhale 1 puff into the lungs 2 (two) times daily.   furosemide (LASIX) 20 MG tablet Take 1 tablet (20 mg total) by mouth 2 (two) times daily.   gabapentin  (NEURONTIN) 400 MG capsule Take 1 capsule (400 mg total) by mouth 3 (three) times daily.   glucose blood (ACCU-CHEK GUIDE) test strip Just check first thing in the morning to ensure no low blood sugar.   hydroxypropyl methylcellulose / hypromellose (ISOPTO TEARS / GONIOVISC) 2.5 % ophthalmic solution Place 1 drop into both eyes 3 (three) times daily as needed. Dry eyes   JARDIANCE 25 MG TABS tablet Take 1 tablet (25 mg total) by mouth daily.   Lancets Misc. (UNISTIK 3 COMFORT) MISC Unistik 3 Comfort Lancets, 25 g, 1.88 mm   MAGNESIUM-OXIDE 400 (241.3 Mg) MG tablet TAKE 1 TABLET BY MOUTH TWICE DAILY   metoprolol tartrate (LOPRESSOR) 25 MG tablet Take 0.5 tablets (12.5 mg total) by mouth 2 (two) times daily.   MULTIPLE VITAMIN PO Take 1 tablet by mouth daily.   polyethylene glycol (MIRALAX / GLYCOLAX) 17 g packet Take 17 g by mouth daily.   Probiotic Product (ALIGN PO) Take 1 capsule by mouth daily.   Propylene Glycol (SYSTANE BALANCE OP) Apply 1 drop to eye 3 (three) times daily as needed. Both eyes   rOPINIRole (REQUIP) 0.5 MG tablet Take 0.5 mg by mouth 3 (three) times daily.   senna-docusate (SENOKOT-S) 8.6-50 MG tablet Take 1 tablet by mouth at bedtime.   tiotropium (SPIRIVA HANDIHALER) 18 MCG inhalation capsule Place 1 capsule (18 mcg total) into inhaler and inhale daily.  No facility-administered medications prior to visit.    Review of Systems  Last hemoglobin A1c Lab Results  Component Value Date   HGBA1C 7.5 (H) 11/19/2021       Objective    BP (!) 110/58 (BP Location: Left Arm, Patient Position: Sitting, Cuff Size: Normal)   Pulse 81   Resp 16   SpO2 97%  BP Readings from Last 3 Encounters:  02/04/22 (!) 110/58  01/28/22 (!) 101/55  12/29/21 (!) 113/58   Wt Readings from Last 3 Encounters:  12/29/21 147 lb 4.8 oz (66.8 kg)  10/19/21 148 lb 2.4 oz (67.2 kg)  10/08/21 156 lb (70.8 kg)      Physical Exam Vitals reviewed.  Constitutional:      Comments: Elderly  white female sitting in a wheelchair.  She is very alert and oriented.  HENT:     Head: Normocephalic and atraumatic.     Right Ear: Tympanic membrane, ear canal and external ear normal.     Left Ear: Tympanic membrane, ear canal and external ear normal.  Eyes:     General: No scleral icterus.    Conjunctiva/sclera: Conjunctivae normal.  Neck:     Vascular: No carotid bruit.  Cardiovascular:     Rate and Rhythm: Normal rate and regular rhythm.     Heart sounds: Normal heart sounds.  Pulmonary:     Breath sounds: Normal breath sounds.  Abdominal:     Palpations: Abdomen is soft. There is no mass.     Comments: Normal abdominal exam.  Musculoskeletal:     Cervical back: No muscular tenderness.     Right lower leg: Edema present.     Left lower leg: Edema present.     Comments: Signifcant thoracic kyphosis. Trace LE edema.Lymphedema.  Lymphadenopathy:     Cervical: No cervical adenopathy.  Skin:    General: Skin is warm and dry.  Neurological:     General: No focal deficit present.     Mental Status: She is alert and oriented to person, place, and time.  Psychiatric:        Mood and Affect: Mood normal.        Behavior: Behavior normal.        Thought Content: Thought content normal.        Judgment: Judgment normal.       No results found for any visits on 02/04/22.  Assessment & Plan     1. Gastroesophageal reflux disease with esophagitis without hemorrhage On appropriate medication.  I have Tums at bedside.  2. Chest pain due to gastrointestinal reflux disease Absolutely feel this is reflux related chest pain.  This is not angina clinically.  3. Anxiety, generalized Chronic  4. Stage 3b chronic kidney disease (HCC) Avoid anti-inflammatories  5. Essential hypertension    No follow-ups on file.      I, [name], MD, have reviewed all documentation for this visit. The documentation on 02/09/22 for the exam, diagnosis, procedures, and orders are  all accurate and complete.    Kaydi Kley Cranford Mon, MD  [facility] [phone] (phone) [phone] (fax)  [place]

## 2022-02-09 DIAGNOSIS — G20 Parkinson's disease: Secondary | ICD-10-CM | POA: Diagnosis not present

## 2022-02-09 DIAGNOSIS — I5023 Acute on chronic systolic (congestive) heart failure: Secondary | ICD-10-CM | POA: Diagnosis not present

## 2022-02-09 DIAGNOSIS — R2681 Unsteadiness on feet: Secondary | ICD-10-CM | POA: Diagnosis not present

## 2022-02-09 DIAGNOSIS — R262 Difficulty in walking, not elsewhere classified: Secondary | ICD-10-CM | POA: Diagnosis not present

## 2022-02-09 DIAGNOSIS — R279 Unspecified lack of coordination: Secondary | ICD-10-CM | POA: Diagnosis not present

## 2022-02-09 DIAGNOSIS — M6281 Muscle weakness (generalized): Secondary | ICD-10-CM | POA: Diagnosis not present

## 2022-02-10 ENCOUNTER — Other Ambulatory Visit (INDEPENDENT_AMBULATORY_CARE_PROVIDER_SITE_OTHER): Payer: Medicare PPO

## 2022-02-10 ENCOUNTER — Ambulatory Visit: Payer: Self-pay

## 2022-02-10 DIAGNOSIS — R319 Hematuria, unspecified: Secondary | ICD-10-CM

## 2022-02-10 DIAGNOSIS — R350 Frequency of micturition: Secondary | ICD-10-CM

## 2022-02-10 DIAGNOSIS — R82998 Other abnormal findings in urine: Secondary | ICD-10-CM | POA: Diagnosis not present

## 2022-02-10 LAB — POCT URINALYSIS DIPSTICK
Glucose, UA: POSITIVE — AB
Ketones, UA: NEGATIVE
Nitrite, UA: NEGATIVE
Protein, UA: NEGATIVE
Spec Grav, UA: 1.01 (ref 1.010–1.025)
Urobilinogen, UA: 0.2 E.U./dL
pH, UA: 6 (ref 5.0–8.0)

## 2022-02-10 MED ORDER — NITROFURANTOIN MONOHYD MACRO 100 MG PO CAPS: 100.0000 mg | ORAL_CAPSULE | Freq: Two times a day (BID) | ORAL | 0 refills | Status: DC

## 2022-02-10 NOTE — Telephone Encounter (Signed)
I tried returning patient's phone call at [phone]. The woman that answered the phone informed me that patient Renee Carpenter passed away earlier this year. Im  not familiar with this patient. Please review this statement for accuracy.

## 2022-02-10 NOTE — Telephone Encounter (Signed)
Copied from CRM [id number]. Topic: General - Inquiry >> Feb 10, 2022  4:59 PM Pincus Sanes wrote:  [phone] Reason for CRM: Pt has called in and stated meds have not been called in for UTI was told were called in ?

## 2022-02-10 NOTE — Telephone Encounter (Signed)
Patient was advised. She will have someone pick up a urine cup from the office.

## 2022-02-10 NOTE — Telephone Encounter (Signed)
Pt stated is having UTI symptoms again and would like to speak with a nurse.  Pt stated current symptoms some odor, back pain and cloudy urine and frequency.    Pt denied abdominal pain and blood in urine.   Pt seeking clinical advice   Chief Complaint: Urinary frequency, odor and cloudy urine. Has had this recently. Declines appointment. Requesting to bring in a specimen. Symptoms: Above Frequency: 2 weeks ago Pertinent Negatives: Patient denies fever Disposition: [] ED /[] Urgent Care (no appt availability in office) / [] Appointment(In office/virtual)/ []  [facility]/ [] Home Care/ [] Refused Recommended Disposition /[] [facility]/ [x]  Follow-up with PCP Additional Notes: Please advise pt.  Answer Assessment - Initial Assessment Questions 1. SYMPTOM: "What's the main symptom you're concerned about?" (e.g., frequency, incontinence)     Frequency, cloudy urine with odor 2. ONSET: "When did the    start?"     2 weeks ago 3. PAIN: "Is there any pain?" If Yes, ask: "How bad is it?" (Scale: 1-10; mild, moderate, severe)     No 4. CAUSE: "What do you think is causing the symptoms?"     UTI 5. OTHER SYMPTOMS: "Do you have any other symptoms?" (e.g., blood in urine, fever, flank pain, pain with urination)     No 6. PREGNANCY: "Is there any chance you are pregnant?" "When was your last menstrual period?"     No  Protocols used: Urinary Symptoms-A-AH

## 2022-02-11 DIAGNOSIS — R262 Difficulty in walking, not elsewhere classified: Secondary | ICD-10-CM | POA: Diagnosis not present

## 2022-02-11 DIAGNOSIS — R2681 Unsteadiness on feet: Secondary | ICD-10-CM | POA: Diagnosis not present

## 2022-02-11 DIAGNOSIS — I5023 Acute on chronic systolic (congestive) heart failure: Secondary | ICD-10-CM | POA: Diagnosis not present

## 2022-02-11 DIAGNOSIS — R279 Unspecified lack of coordination: Secondary | ICD-10-CM | POA: Diagnosis not present

## 2022-02-11 DIAGNOSIS — M6281 Muscle weakness (generalized): Secondary | ICD-10-CM | POA: Diagnosis not present

## 2022-02-11 DIAGNOSIS — G20 Parkinson's disease: Secondary | ICD-10-CM | POA: Diagnosis not present

## 2022-02-11 MED ORDER — NITROFURANTOIN MONOHYD MACRO 100 MG PO CAPS: 100.0000 mg | ORAL_CAPSULE | Freq: Two times a day (BID) | ORAL | 0 refills | Status: AC

## 2022-02-11 NOTE — Telephone Encounter (Signed)
Resent rx to pharmacy. Patient is alive. They have her daughter mixed up with the patient, because they have the same name.

## 2022-02-11 NOTE — Addendum Note (Signed)
Addended by: Marlene Lard on: 02/11/2022 08:42 AM   Modules accepted: Orders

## 2022-02-12 LAB — URINE CULTURE

## 2022-02-16 ENCOUNTER — Telehealth: Payer: Self-pay

## 2022-02-16 DIAGNOSIS — J90 Pleural effusion, not elsewhere classified: Secondary | ICD-10-CM | POA: Diagnosis not present

## 2022-02-16 DIAGNOSIS — R918 Other nonspecific abnormal finding of lung field: Secondary | ICD-10-CM | POA: Diagnosis not present

## 2022-02-16 DIAGNOSIS — J9621 Acute and chronic respiratory failure with hypoxia: Secondary | ICD-10-CM | POA: Diagnosis not present

## 2022-02-16 DIAGNOSIS — R109 Unspecified abdominal pain: Secondary | ICD-10-CM | POA: Diagnosis not present

## 2022-02-16 DIAGNOSIS — J690 Pneumonitis due to inhalation of food and vomit: Secondary | ICD-10-CM | POA: Diagnosis not present

## 2022-02-16 DIAGNOSIS — R531 Weakness: Secondary | ICD-10-CM | POA: Diagnosis not present

## 2022-02-16 DIAGNOSIS — R55 Syncope and collapse: Secondary | ICD-10-CM | POA: Diagnosis not present

## 2022-02-16 DIAGNOSIS — Z9981 Dependence on supplemental oxygen: Secondary | ICD-10-CM | POA: Diagnosis not present

## 2022-02-16 DIAGNOSIS — G9341 Metabolic encephalopathy: Secondary | ICD-10-CM | POA: Diagnosis not present

## 2022-02-16 DIAGNOSIS — I5A Non-ischemic myocardial injury (non-traumatic): Secondary | ICD-10-CM | POA: Diagnosis not present

## 2022-02-16 DIAGNOSIS — D72829 Elevated white blood cell count, unspecified: Secondary | ICD-10-CM | POA: Diagnosis not present

## 2022-02-16 DIAGNOSIS — N184 Chronic kidney disease, stage 4 (severe): Secondary | ICD-10-CM | POA: Diagnosis not present

## 2022-02-16 DIAGNOSIS — R7989 Other specified abnormal findings of blood chemistry: Secondary | ICD-10-CM | POA: Diagnosis not present

## 2022-02-16 DIAGNOSIS — R4189 Other symptoms and signs involving cognitive functions and awareness: Secondary | ICD-10-CM | POA: Diagnosis not present

## 2022-02-16 DIAGNOSIS — I5020 Unspecified systolic (congestive) heart failure: Secondary | ICD-10-CM | POA: Diagnosis not present

## 2022-02-16 DIAGNOSIS — G931 Anoxic brain damage, not elsewhere classified: Secondary | ICD-10-CM | POA: Diagnosis not present

## 2022-02-16 DIAGNOSIS — Z515 Encounter for palliative care: Secondary | ICD-10-CM | POA: Diagnosis not present

## 2022-02-16 DIAGNOSIS — A419 Sepsis, unspecified organism: Secondary | ICD-10-CM | POA: Diagnosis not present

## 2022-02-16 DIAGNOSIS — I5189 Other ill-defined heart diseases: Secondary | ICD-10-CM | POA: Diagnosis not present

## 2022-02-16 DIAGNOSIS — N179 Acute kidney failure, unspecified: Secondary | ICD-10-CM | POA: Diagnosis not present

## 2022-02-16 DIAGNOSIS — M6281 Muscle weakness (generalized): Secondary | ICD-10-CM | POA: Diagnosis not present

## 2022-02-16 DIAGNOSIS — R600 Localized edema: Secondary | ICD-10-CM | POA: Diagnosis not present

## 2022-02-16 NOTE — Telephone Encounter (Unsigned)
Copied from CRM [id number]. Topic: General - Other >> Feb 16, 2022  3:56 PM Dondra Prader E wrote: Reason for CRM: Pt's caregiver Josette called requesting to receive a list of her current medications, pt on vacation in [place]. Had to go to [facility]. Sending CRM just for record.   Fax: [phone]  Also sent email to activate mychart.

## 2022-02-17 NOTE — Chronic Care Management (AMB) (Signed)
  Chronic Care Management   Outreach Note  02/17/2022 Name: Renee Carpenter MRN: [id number] DOB: 09-03-1925  Renee Carpenter is a 88 y.o. year old female who is a primary care patient of Maple Hudson., MD. I reached out to Renee Carpenter by phone today in response to a referral sent by Renee Carpenter's primary care provider.  An unsuccessful telephone outreach was attempted today. The patient was referred to the case management team for assistance with care management and care coordination.   Follow Up Plan: A HIPAA compliant phone message was left for the patient providing contact information and requesting a return call.  The care management team will reach out to the patient again over the next 7 days.  If patient returns call to provider office, please advise to call Embedded Care Management Care Guide Penne Lash * at [phone]*  Penne Lash, RMA Care Guide [facility]  [place], [place] [address] Direct Dial: [phone] [email or web address]

## 2022-02-18 DIAGNOSIS — R4189 Other symptoms and signs involving cognitive functions and awareness: Secondary | ICD-10-CM | POA: Diagnosis not present

## 2022-02-24 ENCOUNTER — Ambulatory Visit: Payer: Medicare PPO | Admitting: Family Medicine

## 2022-02-24 NOTE — Progress Notes (Deleted)
Annual Wellness Visit     Patient: Renee Carpenter, Female    DOB: [birth year], 88 y.o.   MRN: [id number] Visit Date: 02/24/2022  Today's Provider: [name], MD   No chief complaint on file.  Subjective    Renee Carpenter is a 88 y.o. female who presents today for her Annual Wellness Visit. She reports consuming a {diet types:17450} diet. {Exercise:19826} She generally feels {well/fairly well/poorly:18703}. She reports sleeping {well/fairly well/poorly:18703}. She {does/does not:200015} have additional problems to discuss today.   HPI   Medications: Outpatient Medications Prior to Visit  Medication Sig   Accu-Chek FastClix Lancets MISC Just check first thing in the morning to ensure no low blood sugar.   albuterol (VENTOLIN HFA) 108 (90 Base) MCG/ACT inhaler INHALE 2 PUFFS INTO THE LUNGS EVERY 4 HOURS AS NEEDED FOR WHEEZING OR SHORTNESS OF BREATH   Blood Glucose Monitoring Suppl (ACCU-CHEK GUIDE) w/Device KIT Check blood sugars daily as directed   carbidopa-levodopa (SINEMET IR) 25-100 MG tablet Take 1 tablet by mouth 3 (three) times daily. Take 1 tablet three times a day   Coenzyme Q10 (CO Q10) 200 MG CAPS Take 1 capsule by mouth daily.   DULoxetine (CYMBALTA) 30 MG capsule TAKE 1 CAPSULE(30 MG) BY MOUTH DAILY   esomeprazole (NEXIUM) 40 MG capsule TAKE 1 CAPSULE BY MOUTH EVERY DAY   fluticasone (FLONASE) 50 MCG/ACT nasal spray Place 2 sprays into both nostrils daily.   fluticasone-salmeterol (ADVAIR DISKUS) 250-50 MCG/ACT AEPB Inhale 1 puff into the lungs 2 (two) times daily.   furosemide (LASIX) 20 MG tablet Take 1 tablet (20 mg total) by mouth 2 (two) times daily.   gabapentin (NEURONTIN) 400 MG capsule Take 1 capsule (400 mg total) by mouth 3 (three) times daily.   glucose blood (ACCU-CHEK GUIDE) test strip Just check first thing in the morning to ensure no low blood sugar.   hydroxypropyl methylcellulose / hypromellose (ISOPTO TEARS / GONIOVISC) 2.5 %  ophthalmic solution Place 1 drop into both eyes 3 (three) times daily as needed. Dry eyes   JARDIANCE 25 MG TABS tablet Take 1 tablet (25 mg total) by mouth daily.   Lancets Misc. (UNISTIK 3 COMFORT) MISC Unistik 3 Comfort Lancets, 25 g, 1.88 mm   MAGNESIUM-OXIDE 400 (241.3 Mg) MG tablet TAKE 1 TABLET BY MOUTH TWICE DAILY   metoprolol tartrate (LOPRESSOR) 25 MG tablet Take 0.5 tablets (12.5 mg total) by mouth 2 (two) times daily.   MULTIPLE VITAMIN PO Take 1 tablet by mouth daily.   nitrofurantoin, macrocrystal-monohydrate, (MACROBID) 100 MG capsule Take 1 capsule (100 mg total) by mouth 2 (two) times daily.   polyethylene glycol (MIRALAX / GLYCOLAX) 17 g packet Take 17 g by mouth daily.   Probiotic Product (ALIGN PO) Take 1 capsule by mouth daily.   Propylene Glycol (SYSTANE BALANCE OP) Apply 1 drop to eye 3 (three) times daily as needed. Both eyes   rOPINIRole (REQUIP) 0.5 MG tablet Take 0.5 mg by mouth 3 (three) times daily.   senna-docusate (SENOKOT-S) 8.6-50 MG tablet Take 1 tablet by mouth at bedtime.   tiotropium (SPIRIVA HANDIHALER) 18 MCG inhalation capsule Place 1 capsule (18 mcg total) into inhaler and inhale daily.   No facility-administered medications prior to visit.    Allergies  Allergen Reactions   Acetaminophen Itching and Other (See Comments)    Stomach upset   Aspirin Itching and Other (See Comments)    Stomach pain   Metformin And Related  nausea   Milk-Related Compounds Diarrhea   Other Other (See Comments)    Novocaine, strange feeling   Procaine Other (See Comments)    Felt a ripple throughout her body per pt Felt a ripple throughout her body per pt Felt a ripple throughout her body per pt Felt a ripple throughout her body per pt Felt a ripple throughout her body per pt Felt a ripple throughout her body per pt   Sulfa Antibiotics Other (See Comments)   Sulfasalazine     Other reaction(s): Other (See Comments)   Tizanidine     Diarrhea, almost passed  out, felt dizzy and confused   Caffeine Palpitations   Ginger Palpitations   Ibuprofen Rash    Patient Care Team: Jerrol Banana., MD as PCP - General (Unknown Physician Specialty) Vladimir Crofts, MD as Consulting Physician (Neurology) Troxler, Adele Schilder (Inactive) as Attending Physician (Podiatry) Jenne Campus, MD as Referring Physician (Neurosurgery) Ralene Bathe, MD as Consulting Physician (Dermatology) Leandrew Koyanagi, MD as Referring Physician (Ophthalmology) Kris Hartmann, NP as Nurse Practitioner (Vascular Surgery)  Review of Systems  {Labs  Heme  Chem  Endocrine  Serology  Results Review (optional):23779}    Objective    Vitals: There were no vitals taken for this visit. {Show previous vital signs (optional):23777}   Physical Exam ***  Most recent functional status assessment:    10/10/2021    3:00 PM  In your present state of health, do you have any difficulty performing the following activities:  Hearing? 1  Vision? 1  Difficulty concentrating or making decisions? 1  Walking or climbing stairs? 1  Dressing or bathing? 1  Doing errands, shopping? 1   Most recent fall risk assessment:    10/08/2021    1:30 PM  [place] in the past year? 0  Number falls in past yr: 0  Injury with Fall? 0  Risk for fall due to : Impaired mobility  Follow up Education provided;Falls evaluation completed;Falls prevention discussed    Most recent depression screenings:    10/08/2021    1:32 PM 01/17/2020    1:59 PM  PHQ 2/9 Scores  PHQ - 2 Score 1 2  PHQ- 9 Score  9   Most recent cognitive screening:    02/18/2020    2:31 PM  6CIT Screen  What Year? 0 points  What month? 0 points  What time? 0 points  Count back from 20 0 points  Months in reverse 2 points  Repeat phrase 0 points  Total Score 2 points   Most recent Audit-C alcohol use screening    02/18/2020    2:19 PM  Alcohol Use Disorder Test (AUDIT)  1. How often do you  have a drink containing alcohol? 0  2. How many drinks containing alcohol do you have on a typical day when you are drinking? 0  3. How often do you have six or more drinks on one occasion? 0  AUDIT-C Score 0  Alcohol Brief Interventions/Follow-up AUDIT Score <7 follow-up not indicated   A score of 3 or more in women, and 4 or more in men indicates increased risk for alcohol abuse, EXCEPT if all of the points are from question 1   No results found for any visits on 02/24/22.  Assessment & Plan     Annual wellness visit done today including the all of the following: Reviewed patient's Family Medical History Reviewed and updated list of patient's medical  providers Assessment of cognitive impairment was done Assessed patient's functional ability Established a written schedule for health screening [place] Completed and Reviewed  Exercise Activities and Dietary recommendations  Goals      Exercise 3x per week (30 min per time)     Recommend to exercise for 3 days a week for at least 30 minutes at a time.      Increase water intake     Starting 04/20/16, I will increase my water intake to 4 glasses a day.         Immunization History  Administered Date(s) Administered   Fluad Quad(high Dose 65+) 03/13/2019, 04/23/2020, 04/03/2021   Influenza, High Dose Seasonal PF 04/29/2015, 04/20/2016, 03/24/2017, 05/01/2018   PFIZER(Purple Top)SARS-COV-2 Vaccination 06/13/2019, 07/11/2019   Pfizer Covid-19 Vaccine Bivalent Booster 54yrs & up 10/17/2020   Pneumococcal Conjugate-13 04/29/2015   Tdap 05/02/2019    Health Maintenance  Topic Date Due   FOOT EXAM  05/23/2018   INFLUENZA VACCINE  01/05/2022   Pneumonia Vaccine 27+ Years old (2 - PPSV23 or PCV20) 02/02/2023 (Originally 04/28/2016)   COVID-19 Vaccine (4 - Pfizer risk series) 02/02/2023 (Originally 12/12/2020)   Zoster Vaccines- Shingrix (1 of 2) 02/02/2023 (Originally 12/23/1944)   HEMOGLOBIN A1C  05/21/2022    OPHTHALMOLOGY EXAM  01/16/2023   TETANUS/TDAP  05/01/2029   DEXA SCAN  Completed   HPV VACCINES  Aged Out     Discussed health benefits of physical activity, and encouraged her to engage in regular exercise appropriate for her age and condition.    ***  No follow-ups on file.     {provider attestation***:1}   [name], MD  [facility] [phone] (phone) [phone] (fax)  [place]

## 2022-02-26 DIAGNOSIS — J189 Pneumonia, unspecified organism: Secondary | ICD-10-CM | POA: Diagnosis not present

## 2022-02-26 DIAGNOSIS — J449 Chronic obstructive pulmonary disease, unspecified: Secondary | ICD-10-CM | POA: Diagnosis not present

## 2022-02-26 DIAGNOSIS — G20 Parkinson's disease: Secondary | ICD-10-CM | POA: Diagnosis not present

## 2022-02-26 DIAGNOSIS — I5022 Chronic systolic (congestive) heart failure: Secondary | ICD-10-CM | POA: Diagnosis not present

## 2022-03-07 DEATH — deceased

## 2022-04-29 ENCOUNTER — Other Ambulatory Visit: Payer: Self-pay | Admitting: Family Medicine

## 2022-04-29 DIAGNOSIS — K2100 Gastro-esophageal reflux disease with esophagitis, without bleeding: Secondary | ICD-10-CM

## 2023-05-05 IMAGING — CT CT RENAL STONE PROTOCOL
2 of 4 series · 16 of 46 positions shown, 18 images · non-contrast
Comparison: CT February 20, 2008

CLINICAL DATA: Left flank pain and hematuria.

EXAM:
CT ABDOMEN AND PELVIS WITHOUT CONTRAST
TECHNIQUE: Multidetector CT imaging of the abdomen and pelvis was performed
following the standard protocol without IV contrast.

[Series 2: stone full standard · axial · 0.78mm/px · z∈[-1272,-857]mm · 13 of 91 slices shown, 15 images]
[im 4/91  soft-tissue]
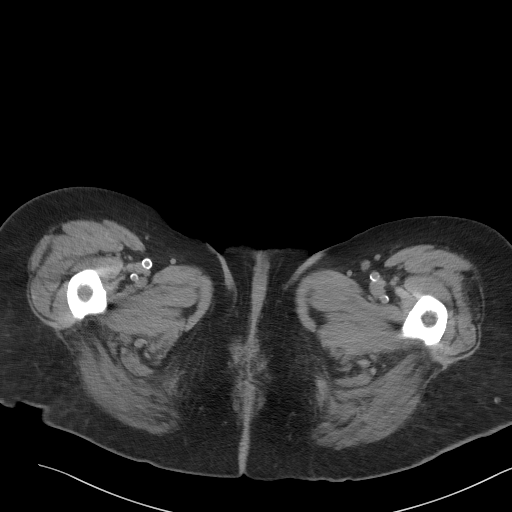
[im 4/91  bone]
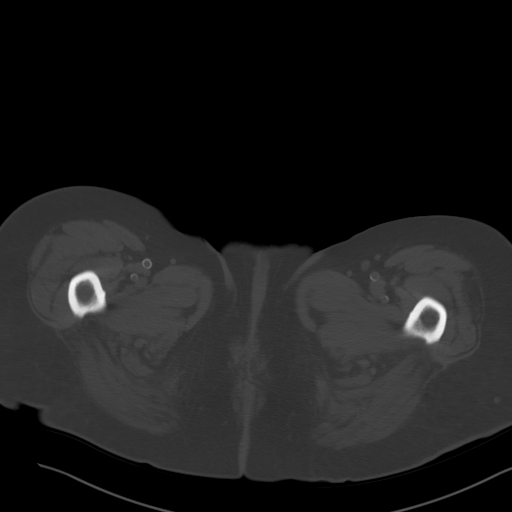
[im 12/91  soft-tissue]
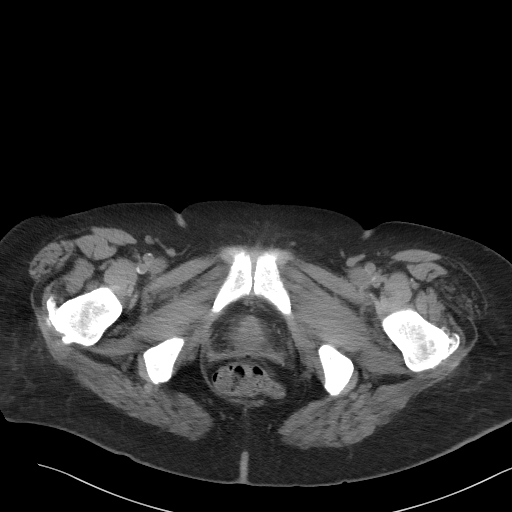
[im 20/91  soft-tissue]
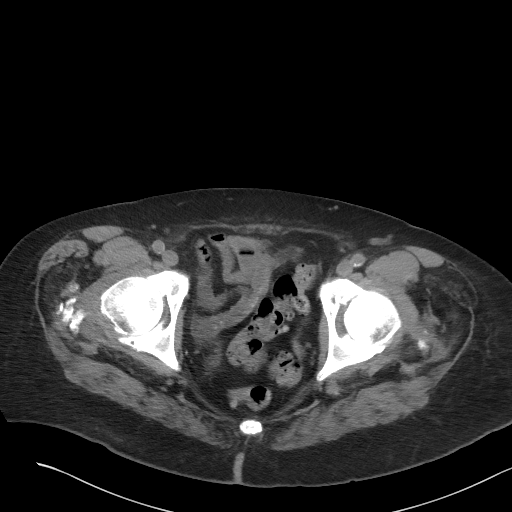
[im 24/91  soft-tissue]
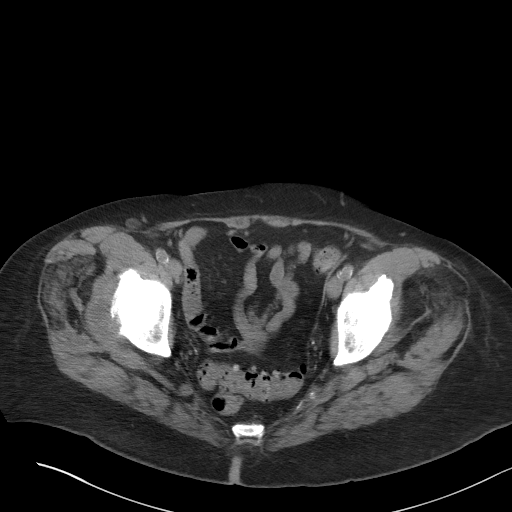
[im 32/91  soft-tissue]
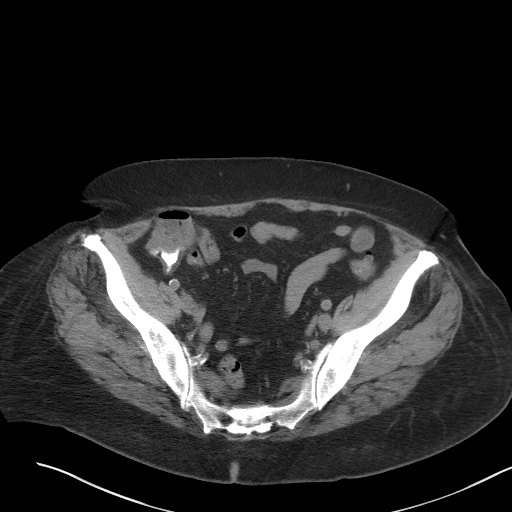
[im 40/91  soft-tissue]
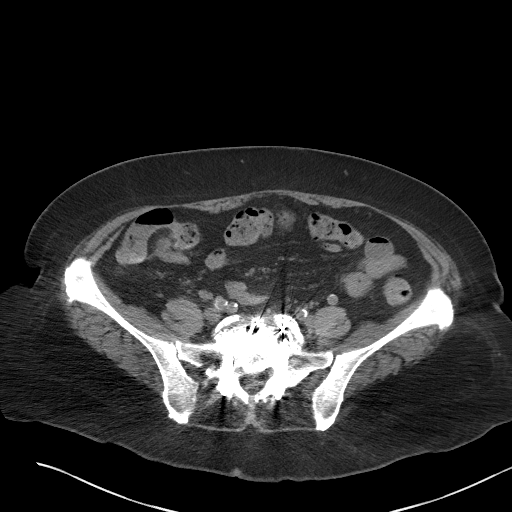
[im 47/91  soft-tissue]
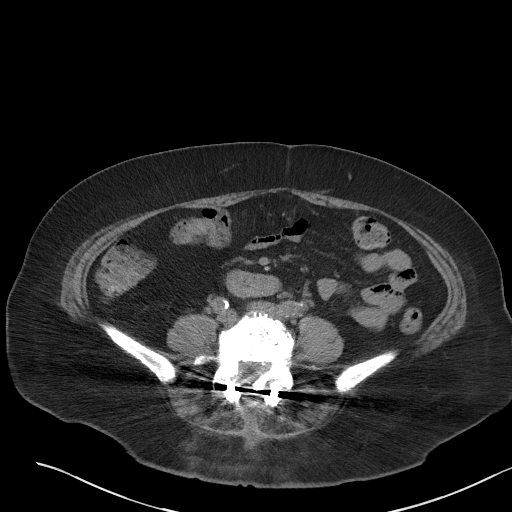
[im 51/91  soft-tissue]
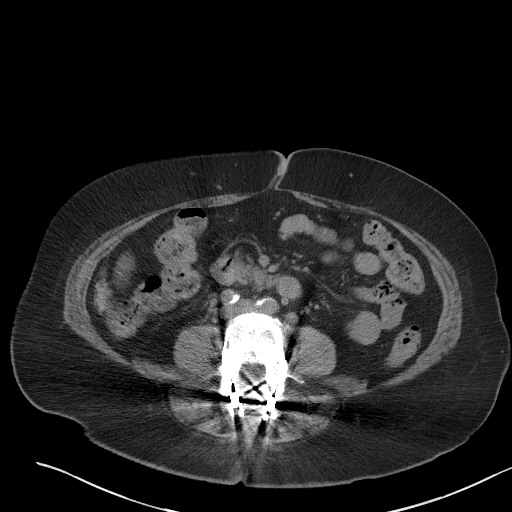
[im 59/91  soft-tissue]
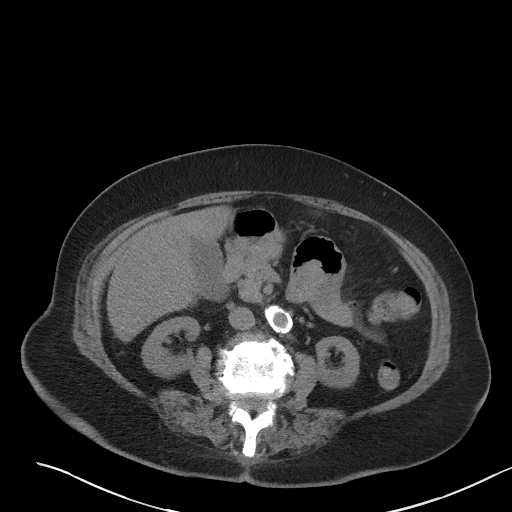
[im 59/91  bone]
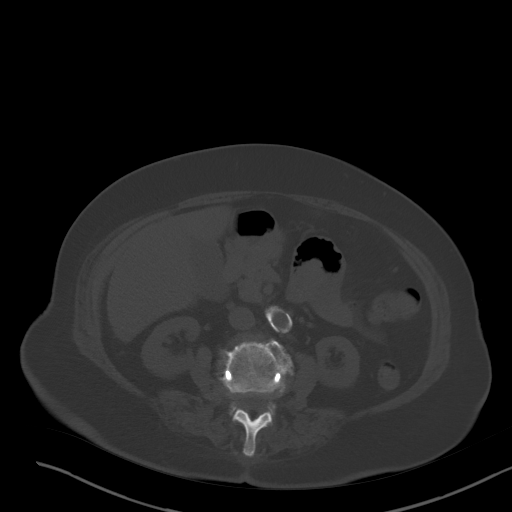
[im 67/91  soft-tissue]
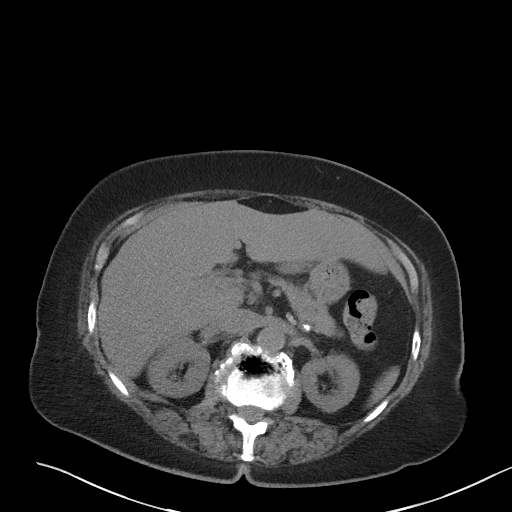
[im 71/91  soft-tissue]
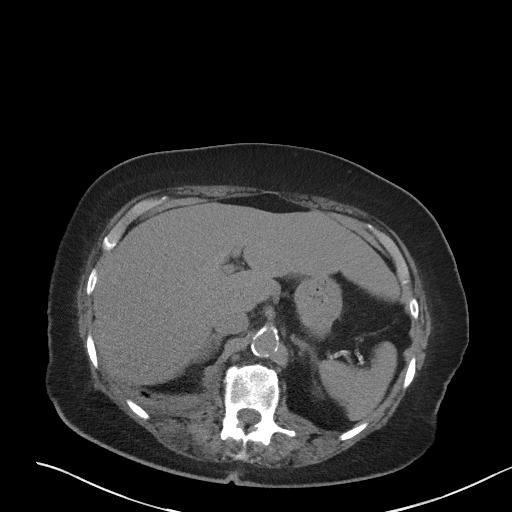
[im 79/91  soft-tissue]
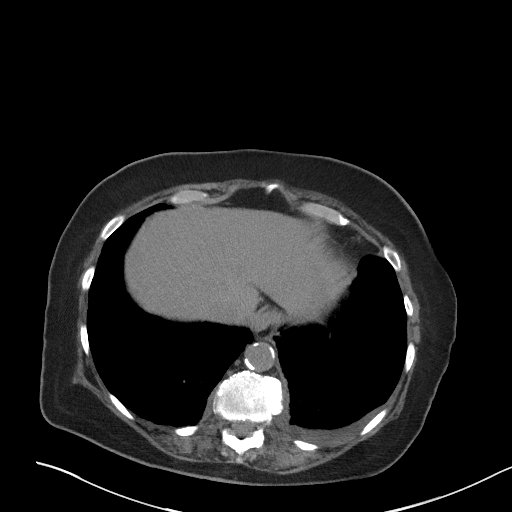
[im 87/91  soft-tissue]
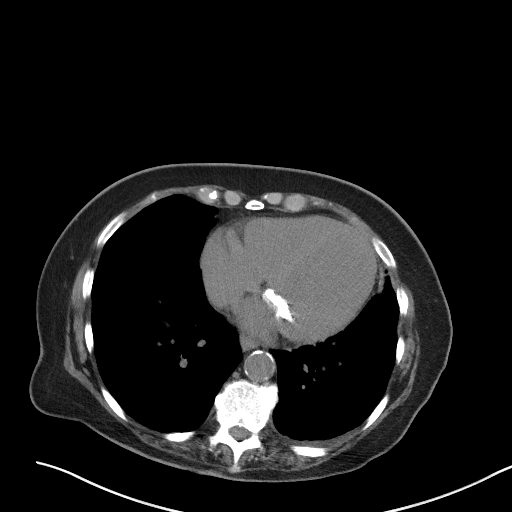

[Series 5: coronal · coronal · 0.77mm/px · 3 of 136 slices shown]
[im 46/136  soft-tissue]
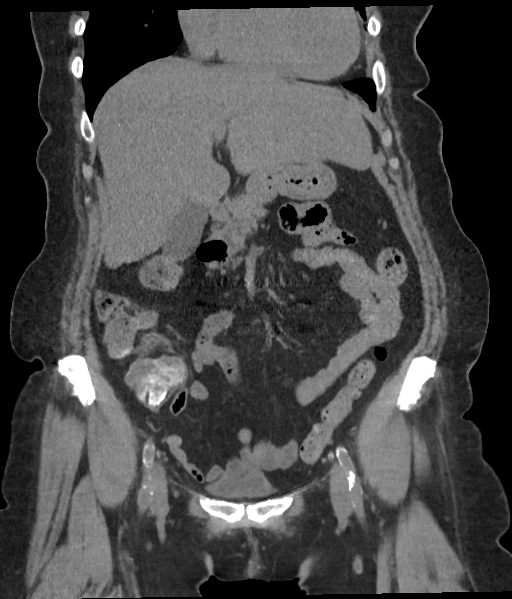
[im 61/136  soft-tissue]
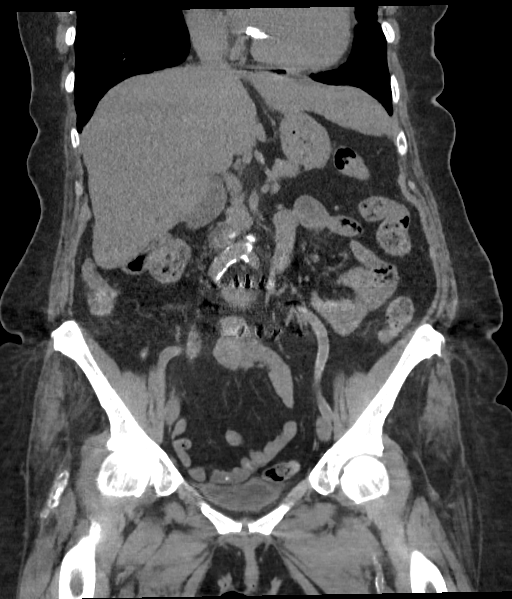
[im 76/136  soft-tissue]
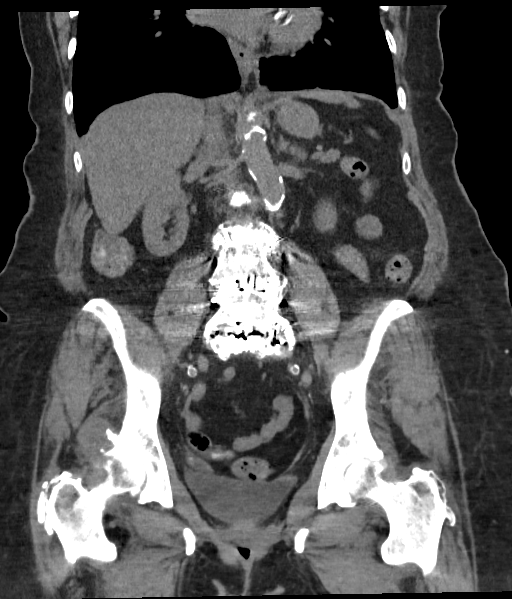

[16 of 46 positions shown; findings below may reference images not displayed]

FINDINGS: Lower chest: No acute abnormality.  Mitral annular calcifications.

Hepatobiliary: Unremarkable noncontrast appearance of the hepatic
parenchyma. Gallbladder is unremarkable. No biliary ductal dilation.

Pancreas: No pancreatic ductal dilation or evidence of acute
inflammation.

Spleen: Within normal limits.

Adrenals/Urinary Tract: Bilateral adrenal glands are unremarkable.
No hydronephrosis. No renal, ureteral or bladder calculi visualized.
Urinary bladder is unremarkable for degree of distension.

Stomach/Bowel: No enteric contrast was administered. Stomach is
decompressed limiting evaluation. Periampullary duodenal
diverticulum. No pathologic dilation of small or large bowel. The
appendix and terminal ileum appear normal. Colonic diverticulosis
without findings of acute diverticulitis. No evidence of acute bowel
inflammation.

Vascular/Lymphatic: Aortic and branch vessel atherosclerosis without
abdominal aortic aneurysm. No pathologically enlarged abdominal or
pelvic lymph nodes.

Reproductive: Status post hysterectomy. No adnexal masses.

Other: No pneumoperitoneum.  No abdominopelvic free fluid.

Musculoskeletal: L3-S1 posterior spinal fusion hardware. Multilevel
degenerative changes spine. Heterotopic ossification about the right
hip. Mild right hip degenerative change. Heterotopic ossification
about the left greater trochanter. No acute osseous abnormality.
IMPRESSION: 1. No acute findings in the abdomen or pelvis. Specifically, no
evidence of obstructive uropathy.
2. Colonic diverticulosis without findings of acute diverticulitis.
3. Aortic Atherosclerosis (WEC0R-YE3.3).

## 2023-08-12 IMAGING — DX DG CHEST 1V PORT
1 series · 1 of 1 positions shown · non-contrast
Comparison: Portable exam 9179 hours compared to 09/10/2020

Correlation: Cervical spine radiographs 01/24/2019

CLINICAL DATA: Shortness of breath

EXAM:
PORTABLE CHEST 1 VIEW

[chest ap]
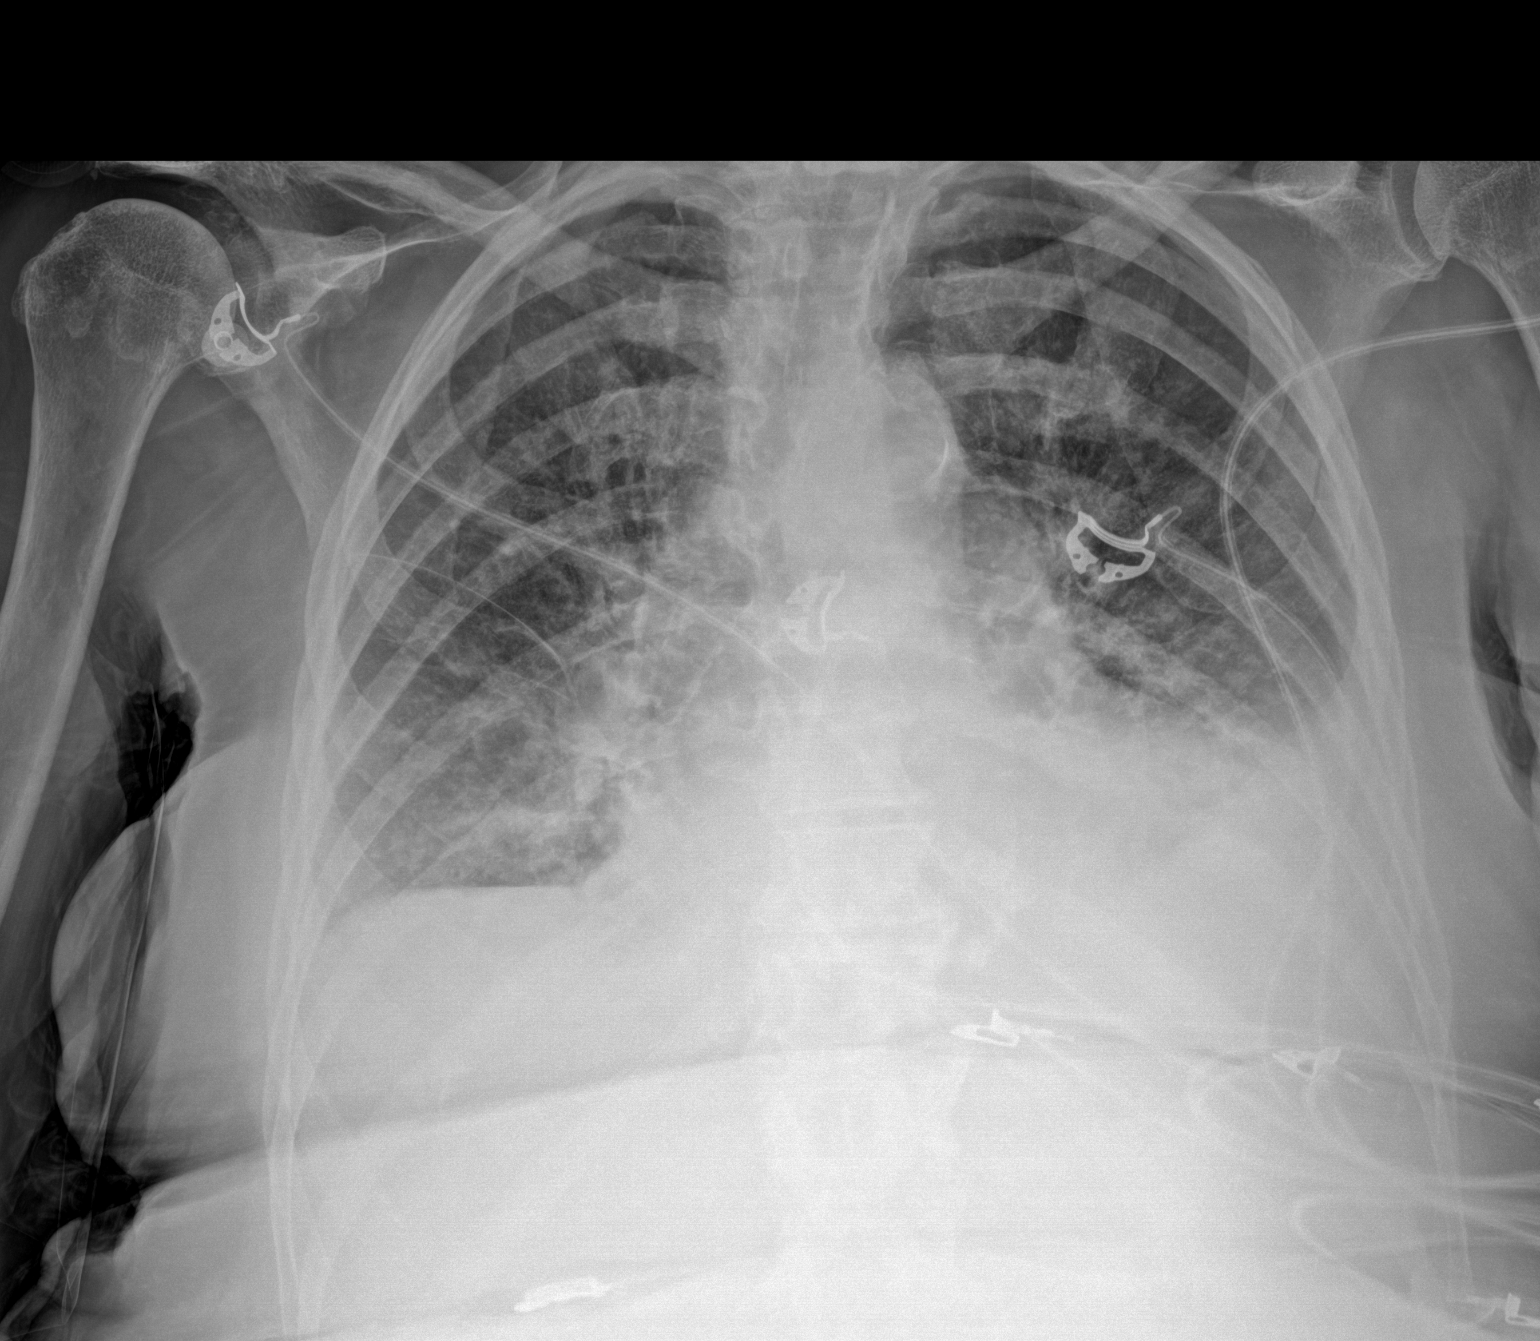

[1 of 1 positions shown; findings below may reference images not displayed]

FINDINGS: Enlargement of cardiac silhouette with pulmonary vascular
congestion.

Atherosclerotic calcification aorta.

BILATERAL perihilar to basilar infiltrates favor pulmonary edema and
CHF.

Small LEFT pleural effusion and LEFT basilar atelectasis.

No pneumothorax.

Prior cervical spine fusion with chronic fracture of the cervical
plate.
IMPRESSION: Enlargement of cardiac silhouette with pulmonary vascular congestion
and diffuse pulmonary infiltrates favoring CHF.

LEFT pleural effusion and basilar atelectasis.

Aortic Atherosclerosis ([id number]-[id number].D).

## 2023-08-14 IMAGING — DX DG CHEST 1V PORT
1 series · 1 of 1 positions shown · non-contrast
Comparison: July 20, 2021

CLINICAL DATA: Shortness of breath and pleural effusion.

EXAM:
PORTABLE CHEST 1 VIEW

[chest ap]
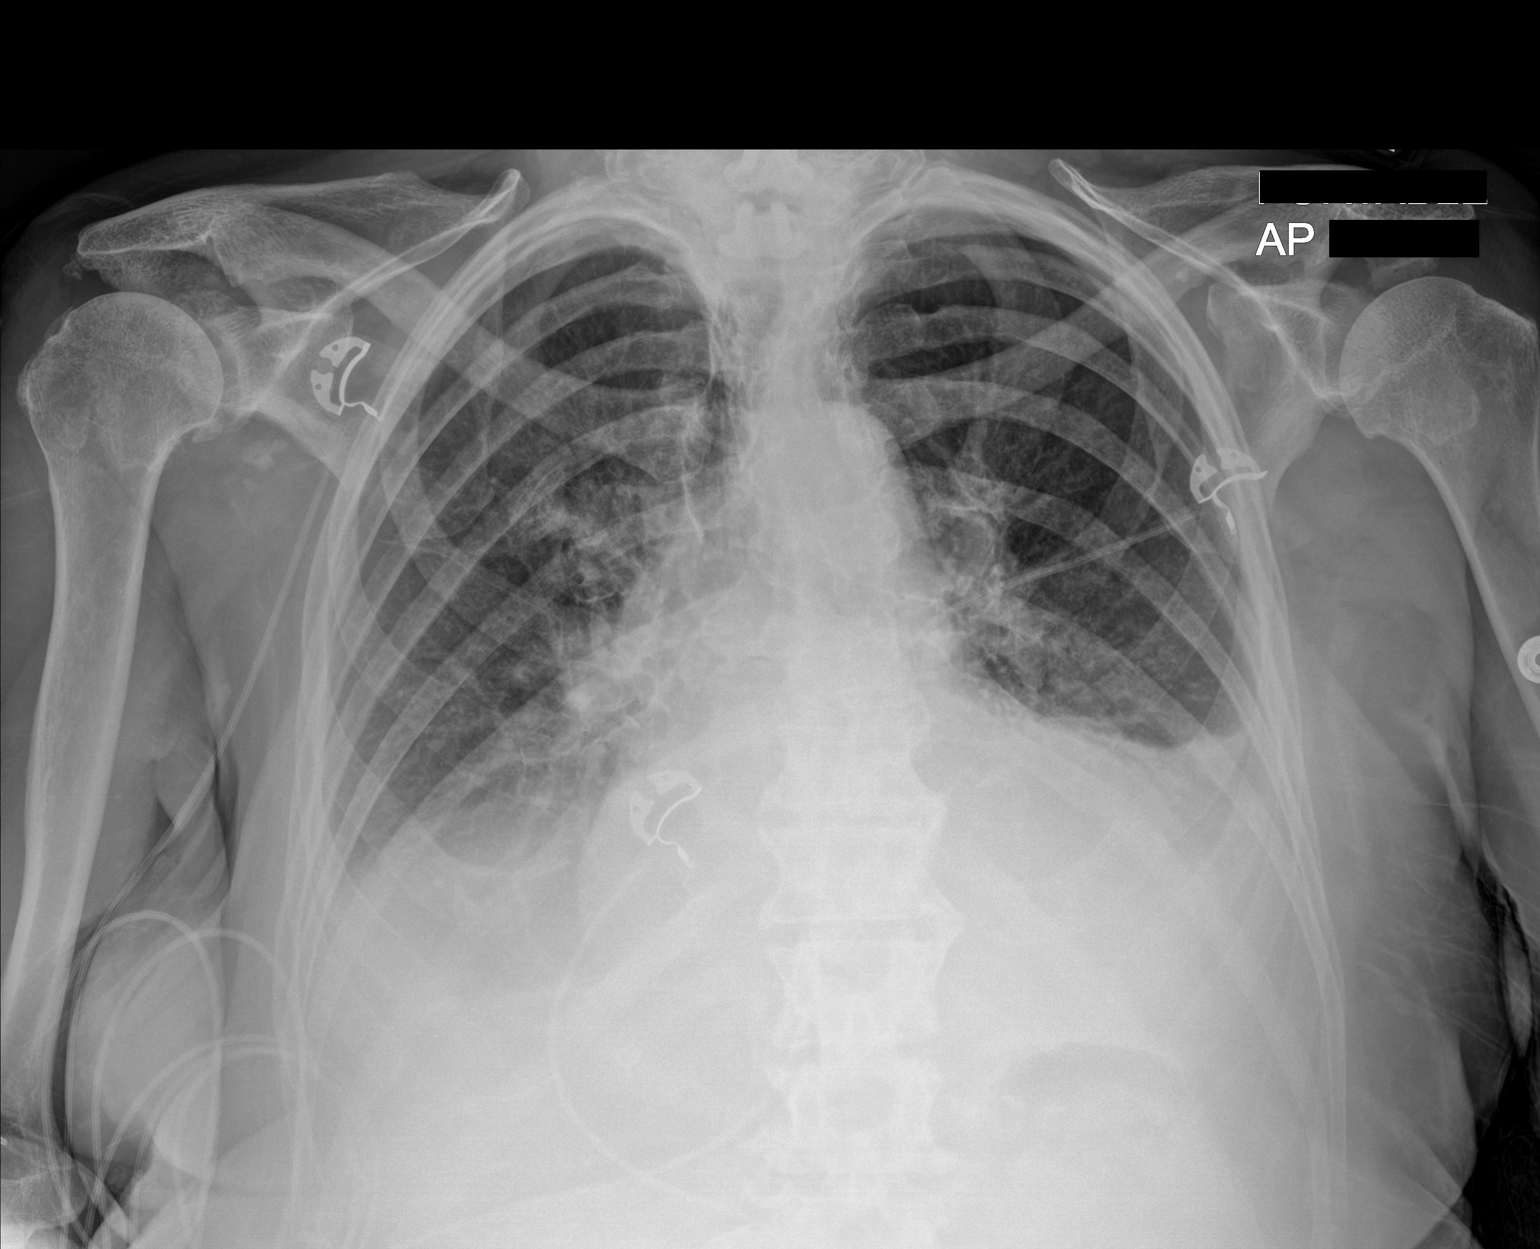

[1 of 1 positions shown; findings below may reference images not displayed]

FINDINGS: Cardiac silhouette is partially obscured but appears enlarged and
stable with similar central vascular congestion. Aortic
atherosclerosis.

Similar perihilar infiltrates. Similar small left pleural effusion
and new small right pleural effusion.

Prior anterior cervical fusion with similar fracture/discontinuity
of the lower aspect of the cervical plate.
IMPRESSION: 1. Cardiomegaly and central vascular congestion with perihilar and
bibasilar infiltrates common likely reflecting edema/CHF.
2. New small right pleural effusion with similar small left pleural
effusion.
